# Patient Record
Sex: Male | Born: 1940 | Race: White | Hispanic: No | Marital: Married | Smoking: Never smoker
Health system: Southern US, Community
[De-identification: ages and names within clinical notes are randomized; demographics above are authoritative.]

## PROBLEM LIST (undated history)

## (undated) DIAGNOSIS — I509 Heart failure, unspecified: Secondary | ICD-10-CM

## (undated) DIAGNOSIS — N19 Unspecified kidney failure: Secondary | ICD-10-CM

## (undated) DIAGNOSIS — C4490 Unspecified malignant neoplasm of skin, unspecified: Secondary | ICD-10-CM

## (undated) DIAGNOSIS — E119 Type 2 diabetes mellitus without complications: Secondary | ICD-10-CM

## (undated) DIAGNOSIS — M5134 Other intervertebral disc degeneration, thoracic region: Secondary | ICD-10-CM

## (undated) DIAGNOSIS — J9690 Respiratory failure, unspecified, unspecified whether with hypoxia or hypercapnia: Secondary | ICD-10-CM

## (undated) DIAGNOSIS — N189 Chronic kidney disease, unspecified: Secondary | ICD-10-CM

## (undated) DIAGNOSIS — K862 Cyst of pancreas: Secondary | ICD-10-CM

## (undated) HISTORY — PX: CHOLECYSTECTOMY: SHX55

## (undated) HISTORY — PX: KYPHOPLASTY: SHX5884

## (undated) HISTORY — PX: AORTOILIAC BYPASS: SHX6417

---

## 2008-10-14 ENCOUNTER — Ambulatory Visit: Payer: Self-pay | Admitting: Vascular Surgery

## 2008-10-15 ENCOUNTER — Emergency Department: Payer: Self-pay | Admitting: Emergency Medicine

## 2010-05-04 ENCOUNTER — Ambulatory Visit: Payer: Self-pay | Admitting: Internal Medicine

## 2010-05-12 ENCOUNTER — Ambulatory Visit: Payer: Self-pay | Admitting: Unknown Physician Specialty

## 2010-05-15 ENCOUNTER — Ambulatory Visit: Payer: Self-pay | Admitting: Cardiovascular Disease

## 2010-05-18 ENCOUNTER — Ambulatory Visit: Payer: Self-pay | Admitting: Unknown Physician Specialty

## 2012-07-21 ENCOUNTER — Ambulatory Visit: Payer: Self-pay | Admitting: Family Medicine

## 2012-08-04 ENCOUNTER — Ambulatory Visit: Payer: Self-pay | Admitting: Gastroenterology

## 2014-03-15 ENCOUNTER — Ambulatory Visit: Payer: Self-pay | Admitting: Nephrology

## 2015-03-17 ENCOUNTER — Inpatient Hospital Stay
Admit: 2015-03-17 | Disposition: A | Discharge status: Home / Self Care | Payer: Self-pay | Attending: Internal Medicine | Admitting: Internal Medicine

## 2015-03-17 LAB — TROPONIN I
Troponin-I: <0.03 ng/mL
Troponin-I: <0.03 ng/mL

## 2015-03-17 LAB — CK TOTAL AND CKMB (NOT AT ARMC)
CK, TOTAL: 116 U/L
CK, Total: 115 U/L
CK-MB: 10.9 ng/mL — ABNORMAL HIGH
CK-MB: 11.5 ng/mL — AB

## 2015-03-18 LAB — CK TOTAL AND CKMB (NOT AT ARMC)
CK, TOTAL: 104 U/L
CK-MB: 10.5 ng/mL — ABNORMAL HIGH

## 2015-03-18 LAB — BASIC METABOLIC PANEL
Anion Gap: 8 (ref 7–16)
BUN: 65 mg/dL — ABNORMAL HIGH
CALCIUM: 8.9 mg/dL
CO2: 29 mmol/L
CREATININE: 1.87 mg/dL — AB
Chloride: 105 mmol/L
EGFR (African American): 41 — ABNORMAL LOW
EGFR (Non-African Amer.): 35 — ABNORMAL LOW
GLUCOSE: 182 mg/dL — AB
Potassium: 5.1 mmol/L
Sodium: 142 mmol/L

## 2015-03-18 LAB — TROPONIN I: Troponin-I: <0.03 ng/mL

## 2015-03-18 LAB — PLATELET COUNT: Platelet: 130 10*3/uL — ABNORMAL LOW (ref 150–440)

## 2015-03-19 LAB — BASIC METABOLIC PANEL
ANION GAP: 6 — AB (ref 7–16)
BUN: 65 mg/dL — AB
CO2: 30 mmol/L
Calcium, Total: 8.2 mg/dL — ABNORMAL LOW
Chloride: 103 mmol/L
Creatinine: 1.78 mg/dL — ABNORMAL HIGH
EGFR (African American): 43 — ABNORMAL LOW
EGFR (Non-African Amer.): 37 — ABNORMAL LOW
GLUCOSE: 87 mg/dL
POTASSIUM: 4.6 mmol/L
SODIUM: 139 mmol/L

## 2015-03-19 LAB — MAGNESIUM: Magnesium: 2.7 mg/dL — ABNORMAL HIGH

## 2015-03-20 LAB — BASIC METABOLIC PANEL
ANION GAP: 7 (ref 7–16)
BUN: 60 mg/dL — AB
CHLORIDE: 103 mmol/L
Calcium, Total: 8.7 mg/dL — ABNORMAL LOW
Co2: 32 mmol/L
Creatinine: 1.66 mg/dL — ABNORMAL HIGH
EGFR (African American): 47 — ABNORMAL LOW
GFR CALC NON AF AMER: 41 — AB
Glucose: 71 mg/dL
Potassium: 4.3 mmol/L
Sodium: 142 mmol/L

## 2015-03-21 LAB — CBC WITH DIFFERENTIAL/PLATELET
BASOS ABS: 0.1 10*3/uL (ref 0.0–0.1)
Basophil %: 1 %
EOS ABS: 0.2 10*3/uL (ref 0.0–0.7)
Eosinophil %: 3 %
HCT: 33.5 % — AB (ref 40.0–52.0)
HGB: 10.9 g/dL — ABNORMAL LOW (ref 13.0–18.0)
LYMPHS ABS: 1.4 10*3/uL (ref 1.0–3.6)
Lymphocyte %: 24.9 %
MCH: 30.7 pg (ref 26.0–34.0)
MCHC: 32.6 g/dL (ref 32.0–36.0)
MCV: 94 fL (ref 80–100)
Monocyte #: 0.6 x10 3/mm (ref 0.2–1.0)
Monocyte %: 11.1 %
Neutrophil #: 3.5 10*3/uL (ref 1.4–6.5)
Neutrophil %: 60 %
Platelet: 126 10*3/uL — ABNORMAL LOW (ref 150–440)
RBC: 3.56 10*6/uL — ABNORMAL LOW (ref 4.40–5.90)
RDW: 14 % (ref 11.5–14.5)
WBC: 5.8 10*3/uL (ref 3.8–10.6)

## 2015-03-21 LAB — APTT: Activated PTT: 35 secs (ref 23.6–35.9)

## 2015-03-21 LAB — BASIC METABOLIC PANEL
ANION GAP: 10 (ref 7–16)
BUN: 48 mg/dL — ABNORMAL HIGH
Calcium, Total: 8.9 mg/dL
Chloride: 94 mmol/L — ABNORMAL LOW
Co2: 37 mmol/L — ABNORMAL HIGH
Creatinine: 1.45 mg/dL — ABNORMAL HIGH
EGFR (African American): 55 — ABNORMAL LOW
EGFR (Non-African Amer.): 48 — ABNORMAL LOW
Glucose: 178 mg/dL — ABNORMAL HIGH
POTASSIUM: 3.9 mmol/L
Sodium: 141 mmol/L

## 2015-03-21 LAB — PROTIME-INR
INR: 1
PROTHROMBIN TIME: 13.6 s

## 2015-03-21 LAB — PROTEIN / CREATININE RATIO, URINE
Creatinine, Urine Random: 27 mg/dL (ref 30–125)
Protein, Urine: <6 mg/dL — ABNORMAL LOW (ref 0–9)

## 2015-03-21 LAB — HEMOGLOBIN A1C: HEMOGLOBIN A1C: 7.7 % — AB

## 2015-03-22 ENCOUNTER — Other Ambulatory Visit: Payer: Self-pay

## 2015-03-22 LAB — BODY FLUID CELL COUNT WITH DIFFERENTIAL
Basophil: 0 %
Eosinophil: 0 %
LYMPHS PCT: 85 %
NUCLEATED CELL COUNT: 1426 /mm3
Neutrophils: 4 %
OTHER CELLS BF: 0 %
OTHER MONONUCLEAR CELLS: 11 %

## 2015-03-22 LAB — BASIC METABOLIC PANEL
ANION GAP: 8 (ref 7–16)
BUN: 46 mg/dL — ABNORMAL HIGH
CALCIUM: 9 mg/dL
Chloride: 92 mmol/L — ABNORMAL LOW
Co2: 40 mmol/L
Creatinine: 1.44 mg/dL — ABNORMAL HIGH
EGFR (African American): 56 — ABNORMAL LOW
GFR CALC NON AF AMER: 48 — AB
Glucose: 213 mg/dL — ABNORMAL HIGH
Potassium: 4 mmol/L
Sodium: 141 mmol/L

## 2015-03-22 LAB — LACTATE DEHYDROGENASE, PLEURAL OR PERITONEAL FLUID: LDH, BODY FLUID: 102 U/L

## 2015-03-22 LAB — PROTEIN, BODY FLUID: Protein, Body Fluid: 4.7 g/dL

## 2015-03-22 LAB — GLUCOSE, SEROUS FLUID: Glucose, Body Fluid: 281 mg/dL

## 2015-03-22 LAB — ALBUMIN, FLUID (OTHER): Body Fluid Albumin: 2.9 g/dL

## 2015-03-26 LAB — BODY FLUID CULTURE

## 2015-03-27 NOTE — Consult Note (Signed)
   Present Illness Pt is a 74 yo male with history of cad s/p cabg in 1997 who has been doing fairly well from a cardiac standpoint until several weeks ago when he began noting increased weight and increased swelling in his legs. He presented to his pcp this morning and had a cxr which revealed bilateral pleural effusions and was sent to the er where he was admitted for probable chf. He has gained approximatly 15 pounds in the last several weeks. He has 3-4+ edema in his legs. He denies orthopnea or pnd. He is able to lay flat in bed. He denies chest pain. He was noted ot have worsening renal failure and was referred to nephrology. He has been on 10 mg of amlodipine for the past few weeks. He is currently hemodynamically stable. Serum tropoinin was normal. renal funciton is somehwat worsenend from previously.   Physical Exam:  GEN no acute distress   HEENT PERRL   NECK No masses   RESP clear BS   CARD Regular rate and rhythm  Murmur   Murmur Systolic   ABD denies tenderness  normal BS   LYMPH negative neck, negative axillae   EXTR negative cyanosis/clubbing   SKIN normal to palpation   NEURO cranial nerves intact, motor/sensory function intact   PSYCH alert, A+O to time, place, person, good insight   Review of Systems:  Subjective/Chief Complaint sob and peripheral edema   General: Fatigue   Skin: No Complaints   ENT: No Complaints   Eyes: No Complaints   Neck: No Complaints   Respiratory: Short of breath   Cardiovascular: No Complaints   Gastrointestinal: No Complaints   Genitourinary: No Complaints   Vascular: No Complaints   Musculoskeletal: No Complaints   Neurologic: No Complaints   Hematologic: No Complaints   Endocrine: No Complaints   Psychiatric: No Complaints   Review of Systems: All other systems were reviewed and found to be negative   Medications/Allergies Reviewed Medications/Allergies reviewed   Family & Social History:  Family and  Social History:  Family History Non-Contributory   Social History negative tobacco   Place of Living Home   EKG:  EKG NSR    Adhesive: Rash   Impression Pt with history of cad s/p cabg now admitted with worsening peripheral edema and mild pleural effusions on cxr. Has acute on chronic renal insuffiency. He has ruled out for an mi. Etiology of volume overload is unclear. Will need echo to evaluate for valvular or structural heart disese. Will also agree with nephrology evaluation. Will carefully diureses and follow. Will also reduce amlodipine to 5 mg dialy and see if this will help.   Plan 1. Careful diuresis following renal funciton 2. Review echo when available 3. Reduce amlodipine to 5 mg dialy and follow blood pressure 4. Agree wtih nephrology consult and input.   Electronic Signatures: Teodoro Spray (MD)  (Signed 21-Apr-16 20:14)  Authored: General Aspect/Present Illness, History and Physical Exam, Review of System, Family & Social History, EKG , Allergies, Impression/Plan   Last Updated: 21-Apr-16 20:14 by Teodoro Spray (MD)

## 2015-03-27 NOTE — H&P (Signed)
PATIENT NAME:  John Schultz, John Schultz MR#:  [id number] DATE OF BIRTH:  [birth year]  DATE OF ADMISSION:  03/17/2015  PRIMARY NEPHROLOGIST:  John Iba, MD   CHIEF COMPLAINT:  Increasing shortness breath and weight gain with leg edema, bilateral lower extremity edema for 2 weeks.   HISTORY OF PRESENT ILLNESS:  John Schultz is a very pleasant 74 year old Caucasian gentleman with past medical history of type 2 diabetes, insulin requiring, history of CKD stage II, followed by nephrology along with history of hypertension, coronary artery disease, status post CABG about 18 years ago, not followed by cardiology as outpatient and no prior history of congestive heart failure, is a direct admit from John Schultz office with increasing shortness of breath, weight gain, and leg edema.  Denies any chest pain or palpitation, not sleeping well due to shortness of breath.  No added salt or NSAIDs, no urinary changes, no dietary changes. He takes Lasix on a daily basis.  The patient denies any recent fever or any other illness.   PAST MEDICAL HISTORY:   1.  Type 2 diabetes on insulin. He follows up with Dr. Gabriel Schultz.    2.  Hypertension.  3.  Coronary artery disease status post CABG about 18 years ago.  4.  History of CKD stage II with a workup in the past for nephrotic syndrome by Dr. Candiss Schultz, creatinine baseline is 1.1.  5.  Chronic back pain with mild spinal stenosis.  6.  Hypercholesterolemia.  7.  Peripheral vascular disease.  8.  Diabetic retinopathy.   9.  History of osteoporosis.   10. Compression fracture.  11. Vitamin D deficiency.  12. History of skin cancer.   FAMILY HISTORY:  Positive for hypertension and diabetes.   SOCIAL HISTORY:  Married, lives at home.  Nonsmoker, nonalcoholic.   REVIEW OF SYSTEMS:   CONSTITUTIONAL:  No fever. Positive for fatigue and weakness.  EYES:  No blurred or double vision.  ENT:  No tinnitus, ear pain, or hearing loss.  RESPIRATORY:  No cough, wheeze, or  hemoptysis.  CARDIOVASCULAR:  No chest pain.  Positive for orthopnea and edema and paroxysmal nocturnal dyspnea.  GASTROINTESTINAL:  No nausea, vomiting, diarrhea or abdominal pain.  No GERD.  GENITOURINARY:  No dysuria, hematuria, or frequency.  ENDOCRINE:  No polyuria, nocturia or thyroid problems.  HEMATOLOGY:  No anemia or easy bruising.  SKIN:  No acne or rash.  MUSCULOSKELETAL:  Positive for arthritis.  NEUROLOGIC:  No CVA, TIA, dysarthria, or dementia.  PSYCHIATRIC:  No anxiety or depression.     All other systems reviewed and negative.   MEDICATIONS:  1.  Vitamin D 1 tablet p.o. daily.  2.  Tylenol 500 two tablets every 8 hourly.  3.  Os-Cal with calcium 1 tablet daily.  4.  NovoLog 5 units subcutaneously daily at lunch, 5 units once a day at dinner according to the sliding scale.  5.  Multivitamin p.o. daily.  6.  Metformin extended release 500 mg 3 tablets p.o. daily at dinnertime.   7.  Lovastatin 40 mg 2 tablets at bedtime, comes to 80 mg at bedtime.  8.  Lisinopril 20 mg b.i.d.  9.  Lantus 15 units subcutaneous daily at bedtime.  10. Gabapentin 300 mg 2 tablets p.o. daily at bedtime.  11. Lasix 20 mg p.o. daily as needed.  12. Chlorthalidone 25 mg once a day.  13. Atenolol 25 mg 1 tablet b.i.d.  14. Aspirin 81 mg daily.  15. Amlodipine 10 mg daily.  16. Alendronate 1 tablet weekly.   PHYSICAL EXAMINATION:  GENERAL:  The patient is awake, alert, oriented x 3, not in acute distress.   VITAL SIGNS:  Afebrile, pulse 53, respirations 18, blood pressure is 145/71, saturations are 89% room air, 95% on 1 liter.  HEENT:  Atraumatic, normocephalic.  PERRLA.  EOM intact.  Oral mucosa is moist.  NECK:  Supple.  No JVD.  No carotid bruit.  RESPIRATORY:  There are decreased breath sounds bilaterally in the bases.  Few crackles heard.  No respiratory distress or labored breathing.  CARDIOVASCULAR:  Both the heart sounds are normal.  Rate and rhythm is regular.  PMI not  lateralized.  Chest nontender.  EXTREMITIES:  The patient has pitting edema bilaterally up to the knee joint.  Feeble pedal pulses secondary to edema and good femoral pulses.  ABDOMEN:  Soft, benign, nontender.  No organomegaly.  Positive bowel sounds.  NEUROLOGIC:  Grossly intact cranial nerves II through XII.  No motor or sensory deficits.  PSYCHIATRIC:  The patient is awake, alert, oriented x 3.  SKIN:  Warm and dry.    LABORATORIES:  Outpatient labs that were done at [facility] today, sodium is 139, potassium is 5.4, chloride is 104, bicarbonate is 30.6, BUN is 64, creatinine is 2.1, baseline creatinine is 1.1, glucose is 370, AST is 23, ALT is 17, bilirubin total is 0.6, total protein is 7.5. Hemoglobin and hematocrit are 11.2 and 34.9, white count is 6.1, platelet count is 143,000. Chest x-ray according to the PA in John Schultz office showed pulmonary vascular congestion/pulmonary edema.   ASSESSMENT AND PLAN:  74 year old John Schultz with a history of coronary artery disease status post coronary artery bypass graft, hypertension, peripheral vascular disease, type 2 diabetes, comes in with:  1.  Acute congestive heart failure.  Ejection fraction unknown at this time.  We will admit the patient to telemetry floor.  Start IV Lasix 20 mg t.i.d., monitor his inputs and outputs and creatinine closely.  Cycle cardiac enzymes x 3.  Check lipid profile.  Will get echocardiogram of the heart.  Will continue the patient on atenolol and amlodipine for now.  The case was discussed with Dr. Ubaldo Schultz who will see the patient in consultation.  2.  Hyperlipidemia.  Continue lovastatin.  3.  Acute on chronic kidney disease stage III.  The patient's baseline creatinine is 1.1, today it is 2.13.  Dr. Candiss Schultz to see the patient.  There was workup for nephrotic syndrome done as outpatient per Dr. Candiss Schultz.  Avoid nephrotoxins.  4.  Type 2 diabetes. I will continue Lantus and sliding scale insulin.  I will hold off on  metformin given elevated creatinine.  .   5.  Peripheral neuropathy.  Continue gabapentin.  6.  For deep venous thrombosis prophylaxis, subcutaneous heparin t.i.d.    Further work-up according to the patient's clinical course.  Hospital admission plan was discussed with the patient and the patient's family members.   TIME SPENT:  50 minutes.   CODE STATUS: The patient is a full code.     ____________________________ Hart [place]. Posey Pronto, MD sap:NT D: 03/17/2015 16:22:22 ET T: 03/17/2015 16:51:03 ET JOB#: [id number]  cc: Lucretia Pendley A. Posey Pronto, MD, <Dictator> John Iba, MD Ilda Basset MD ELECTRONICALLY SIGNED 03/25/2015 15:21

## 2015-03-27 NOTE — Consult Note (Signed)
Brief Consult Note: Diagnosis: edema and wieght gain.   Patient was seen by consultant.   Recommend further assessment or treatment.   Orders entered.   Discussed with Attending MD.   Comments: 74 yo male s/p cabg in 1998 at [facility] admitted after presenting to his pcp with 2 weeks of weight gain and edema in lower extremeties. Started several months agoo with graducal worseing. Ruled out for mi thus far. Is on high dose amolodipine. Will reduce amlodipine to 5 mg. Has ckd. Agree with nephroloy evaluateion. WIll review echo when avaialbel and make further recs. Gentle diuresis.  Electronic Signatures: Teodoro Spray (MD)  (Signed 21-Apr-16 17:10)  Authored: Brief Consult Note   Last Updated: 21-Apr-16 17:10 by Teodoro Spray (MD)

## 2015-03-27 NOTE — Discharge Summary (Signed)
PATIENT NAME:  John Schultz, KEO MR#:  [id number] DATE OF BIRTH:  1941-10-03  DATE OF ADMISSION:  03/17/2015 DATE OF DISCHARGE:  03/22/2015  ADMITTING PHYSICIAN: Sona A. Posey Pronto, MD    DISCHARGING PHYSICIAN: Gladstone Lighter, MD   PRIMARY CARE PHYSICIAN: Dion Body, MD .   PRIMARY CARDIOLOGIST: [facility].   PRIMARY ENDOCRINOLOGIST: Sherlon Handing, MD    [place].  1. Cardiology consultation with Dr. Ubaldo Glassing.  2. Nephrology consultation with Dr. Anthonette Legato.   DISCHARGE DIAGNOSES:  1. Acute respiratory failure.  2. 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  3. Hypertension.  4. Chronic stage II, baseline creatinine around 1.2.  5. Acute renal failure in the hospital.  6. Anasarca.  7. Delirium in the hospital.  8. Diabetic neuropathy.  9. Diabetes mellitus.   DISCHARGE HOME MEDICATIONS:  1. Atenolol 25 mg p.o. b.i.d.  2. Norvasc 10 mg p.o. daily.  3. Lantus 15 units subcutaneous at bedtime.  4. NovoLog 5 units subcutaneously with dinner; and if greater than 200, 2 units for every 50 greater than 200.  5. Aspirin 81 mg p.o. daily.  6. Tylenol 1000 mg every 8 hours.  7. Os-Cal with calcium, vitamin D, 1 tablet p.o. daily.  8. Multivitamin 1 tablet p.o. daily.  9. Vitamin D 1 tablet p.o. daily.  10. Alendronate weekly on Sundays.  11. Lovastatin 40 mg 2 tablets at bedtime.  12. NovoLog 5 units subcutaneously once a day at lunch.  13. Gabapentin 300 mg p.o. at bedtime.  14. Lasix 40 mg p.o. daily.  15. Imdur 30 mg p.o. daily.  16. Klor-Con 10 mEq  p.o. daily while taking Lasix.   DISCHARGE HOME OXYGEN: 1 liter.   DISCHARGE DIET: Low-sodium, ADA, 1800-calorie diet.   DISCHARGE ACTIVITY: As tolerated.    FOLLOWUP INSTRUCTIONS: 1. PCP follow-up in 1-2 weeks.  2. Nephrology follow-up in 2 weeks.  3. Endocrinology follow-up in 2-3 weeks.  4. Home health.   LABORATORIES AND IMAGING STUDIES PRIOR TO  DISCHARGE: Sodium 141, potassium 4.0, chloride 92, bicarbonate 40, BUN 46, creatinine 1.4, glucose 213, calcium of 9.0.   WBC 5.8, hemoglobin 10.9, hematocrit 33.5, platelet count is 126,000. Chest x-ray on 03/20/2015 showing moderate to large right pleural effusion and stable pulmonary parenchymal consolidation. The patient did have thoracentesis on the right side done and about 800 mL of serous fluid was removed. Repeat chest x-ray prior to discharge showing low lung volumes, left basilar atelectasis, and probable small right pleural effusion noted, and pulmonary edema noted. Pleural fluid cytology is pending; however, Gram stain and cultures are negative, it seems to look like transudate. INR is 1.0. HbA1c is 7.7.   BRIEF HOSPITAL COURSE: Mr. John Schultz is a 74 year old male with past medical history significant for insulin-dependent diabetes mellitus, chronic kidney disease stage II, coronary artery disease status post bypass graft surgery, was sent in from primary care 27 office secondary to worsening weight gain and increasing shortness of breath.  1. Anasarca and acute hypoxic respiratory failure secondary to worsening right pleural effusion and also acute on chronic diastolic congestive heart failure exacerbation. He was placed on a Lasix drip and has achieved significant diuresis. He has put on about 17 pounds in 6 weeks. His baseline weight is around 189 pounds. He is back to his baseline weight after diuresis and is being discharged on oral Lasix. He was seen by cardiology and also by nephrology in the hospital. The patient also had right-sided thoracentesis  for his large right pleural effusion for therapeutic purposes with significant relief. He had about 800cc pleural fluid removed by it. He remained on 3 liters oxygen in the hospital; however, was able to be weaned to 1 liter. Hopefully, he will be able to wean off the oxygen as an outpatient.   2. Diabetic neuropathy on gabapentin, higher  dose that is being decreased at the time of discharge due to acute delirium at nighttime in the hospital:  3. Acute delirium in the hospital, only sundowning in the evening. By every morning, he used to be very clear and used to get upset because of his behavior the night before. Risperidone was ordered as needed. After decreasing gabapentin dose, his delirium has improved.  4. Insulin-dependent diabetes mellitus, following with Dr. Gabriel Carina as an outpatient. Sugars were in the 200 range mostly, A1c is 7.7. Metformin is being stopped at the time of discharge due to his acute on chronic kidney disease.  5. Acute renal failure on chronic kidney disease stage II. Baseline creatinine around 1.1 to 1.2, increased in the hospital, but is discharged in a stable condition of 1.4. Followed by nephrology in the hospital and outpatient follow-up recommended.   6. Hypertension. Medications were adjusted in the hospital, and the patient was advised to be on these changed medications at this time.  7. His course has been otherwise uneventful in the hospital.   DISCHARGE CONDITION: Stable.   DISCHARGE DISPOSITION: Home with home health.   TIME SPENT ON DISCHARGE: 40 minutes.     ____________________________ Gladstone Lighter, MD rk:mw D: 03/23/2015 10:48:40 ET T: 03/23/2015 14:08:05 ET JOB#: [id number]  cc: Gladstone Lighter, MD, <Dictator> A. Lavone Orn, MD Dion Body, MD Murlean Iba, MD Gladstone Lighter MD ELECTRONICALLY SIGNED 03/24/2015 18:17

## 2015-03-28 LAB — CYTOLOGY - NON PAP

## 2015-04-24 ENCOUNTER — Encounter: Payer: Self-pay | Admitting: Emergency Medicine

## 2015-04-24 ENCOUNTER — Other Ambulatory Visit: Payer: Self-pay

## 2015-04-24 ENCOUNTER — Emergency Department
Admission: EM | Admit: 2015-04-24 | Discharge: 2015-04-24 | Disposition: A | Discharge status: Home / Self Care | Payer: Commercial Managed Care - HMO | Attending: Emergency Medicine | Admitting: Emergency Medicine

## 2015-04-24 ENCOUNTER — Emergency Department: Payer: Commercial Managed Care - HMO

## 2015-04-24 DIAGNOSIS — R635 Abnormal weight gain: Secondary | ICD-10-CM | POA: Insufficient documentation

## 2015-04-24 DIAGNOSIS — R600 Localized edema: Secondary | ICD-10-CM | POA: Insufficient documentation

## 2015-04-24 DIAGNOSIS — Z951 Presence of aortocoronary bypass graft: Secondary | ICD-10-CM | POA: Insufficient documentation

## 2015-04-24 DIAGNOSIS — E119 Type 2 diabetes mellitus without complications: Secondary | ICD-10-CM | POA: Insufficient documentation

## 2015-04-24 DIAGNOSIS — I2510 Atherosclerotic heart disease of native coronary artery without angina pectoris: Secondary | ICD-10-CM | POA: Insufficient documentation

## 2015-04-24 DIAGNOSIS — I509 Heart failure, unspecified: Secondary | ICD-10-CM | POA: Diagnosis not present

## 2015-04-24 DIAGNOSIS — N182 Chronic kidney disease, stage 2 (mild): Secondary | ICD-10-CM | POA: Insufficient documentation

## 2015-04-24 DIAGNOSIS — M7989 Other specified soft tissue disorders: Secondary | ICD-10-CM

## 2015-04-24 HISTORY — DX: Unspecified kidney failure: N19

## 2015-04-24 HISTORY — DX: Heart failure, unspecified: I50.9

## 2015-04-24 HISTORY — DX: Respiratory failure, unspecified, unspecified whether with hypoxia or hypercapnia: J96.90

## 2015-04-24 HISTORY — DX: Type 2 diabetes mellitus without complications: E11.9

## 2015-04-24 HISTORY — DX: Cyst of pancreas: K86.2

## 2015-04-24 HISTORY — DX: Unspecified malignant neoplasm of skin, unspecified: C44.90

## 2015-04-24 HISTORY — DX: Other intervertebral disc degeneration, thoracic region: M51.34

## 2015-04-24 LAB — TROPONIN I: Troponin I: <0.03 ng/mL (ref <0.031)

## 2015-04-24 LAB — COMPREHENSIVE METABOLIC PANEL
ALBUMIN: 4 g/dL (ref 3.5–5.0)
ALT: 18 U/L (ref 17–63)
AST: 32 U/L (ref 15–41)
Alkaline Phosphatase: 60 U/L (ref 38–126)
Anion gap: 8 (ref 5–15)
BILIRUBIN TOTAL: 0.6 mg/dL (ref 0.3–1.2)
BUN: 45 mg/dL — AB (ref 6–20)
CO2: 31 mmol/L (ref 22–32)
Calcium: 9 mg/dL (ref 8.9–10.3)
Chloride: 99 mmol/L — ABNORMAL LOW (ref 101–111)
Creatinine, Ser: 1.49 mg/dL — ABNORMAL HIGH (ref 0.61–1.24)
GFR, EST AFRICAN AMERICAN: 52 mL/min — AB (ref >60)
GFR, EST NON AFRICAN AMERICAN: 45 mL/min — AB (ref >60)
GLUCOSE: 76 mg/dL (ref 65–99)
Potassium: 4.8 mmol/L (ref 3.5–5.1)
SODIUM: 138 mmol/L (ref 135–145)
Total Protein: 8 g/dL (ref 6.5–8.1)

## 2015-04-24 LAB — CBC WITH DIFFERENTIAL/PLATELET
Basophils Absolute: 0.1 10*3/uL (ref 0–0.1)
Basophils Relative: 1 %
Eosinophils Absolute: 0.3 10*3/uL (ref 0–0.7)
Eosinophils Relative: 5 %
HCT: 37.4 % — ABNORMAL LOW (ref 40.0–52.0)
Hemoglobin: 12.4 g/dL — ABNORMAL LOW (ref 13.0–18.0)
Lymphocytes Relative: 31 %
Lymphs Abs: 1.7 10*3/uL (ref 1.0–3.6)
MCH: 30.7 pg (ref 26.0–34.0)
MCHC: 33.1 g/dL (ref 32.0–36.0)
MCV: 92.9 fL (ref 80.0–100.0)
Monocytes Absolute: 0.5 10*3/uL (ref 0.2–1.0)
Monocytes Relative: 9 %
NEUTROS ABS: 3 10*3/uL (ref 1.4–6.5)
NEUTROS PCT: 54 %
Platelets: 148 10*3/uL — ABNORMAL LOW (ref 150–440)
RBC: 4.03 MIL/uL — ABNORMAL LOW (ref 4.40–5.90)
RDW: 13.5 % (ref 11.5–14.5)
WBC: 5.6 10*3/uL (ref 3.8–10.6)

## 2015-04-24 LAB — BRAIN NATRIURETIC PEPTIDE: B NATRIURETIC PEPTIDE 5: 703 pg/mL — AB (ref 0.0–100.0)

## 2015-04-24 NOTE — ED Notes (Signed)
NAD noted at time of D/C. Pt denies questions or concerns. Pt ambulatory to the lobby at this time.  

## 2015-04-24 NOTE — ED Notes (Signed)
Patient to ED with wife who reports patient was hospitalized a couple of months ago due to CHF, over last week has gained 6-7 pounds. Patient also reports some increase in weakness and leg swelling.

## 2015-04-24 NOTE — Discharge Instructions (Signed)
Edema Edema is an abnormal buildup of fluids. It is more common in your legs and thighs. Painless swelling of the feet and ankles is more likely as a person ages. It also is common in looser skin, like around your eyes. HOME CARE   Keep the affected body part above the level of the heart while lying down.  Do not sit still or stand for a long time.  Do not put anything right under your knees when you lie down.  Do not wear tight clothes on your upper legs.  Exercise your legs to help the puffiness (swelling) go down.  Wear elastic bandages or support stockings as told by your doctor.  A low-salt diet may help lessen the puffiness.  Only take medicine as told by your doctor. GET HELP IF:  Treatment is not working.  You have heart, liver, or kidney disease and notice that your skin looks puffy or shiny.  You have puffiness in your legs that does not get better when you raise your legs.  You have sudden weight gain for no reason. GET HELP RIGHT AWAY IF:   You have shortness of breath or chest pain.  You cannot breathe when you lie down.  You have pain, redness, or warmth in the areas that are puffy.  You have heart, liver, or kidney disease and get edema all of a sudden.  You have a fever and your symptoms get worse all of a sudden. MAKE SURE YOU:   Understand these instructions.  Will watch your condition.  Will get help right away if you are not doing well or get worse. Document Released: 04/30/2008 Document Revised: 11/17/2013 Document Reviewed: 09/04/2013 [facility] Patient Information 2015 [place], [place]. This information is not intended to replace advice given to you by your health care provider. Make sure you discuss any questions you have with your health care provider.  Heart Failure Heart failure means your heart has trouble pumping blood. This makes it hard for your body to work well. Heart failure is usually a long-term (chronic) condition. You must take good  care of yourself and follow your doctor's treatment plan. HOME CARE  Take your heart medicine as told by your doctor.  Do not stop taking medicine unless your doctor tells you to.  Do not skip any dose of medicine.  Refill your medicines before they run out.  Take other medicines only as told by your doctor or pharmacist.  Stay active if told by your doctor. The elderly and people with severe heart failure should talk with a doctor about physical activity.  Eat heart-healthy foods. Choose foods that are without trans fat and are low in saturated fat, cholesterol, and salt (sodium). This includes fresh or frozen fruits and vegetables, fish, lean meats, fat-free or low-fat dairy foods, whole grains, and high-fiber foods. Lentils and dried peas and beans (legumes) are also good choices.  Limit salt if told by your doctor.  Cook in a healthy way. Roast, grill, broil, bake, poach, steam, or stir-fry foods.  Limit fluids as told by your doctor.  Weigh yourself every morning. Do this after you pee (urinate) and before you eat breakfast. Write down your weight to give to your doctor.  Take your blood pressure and write it down if your doctor tells you to.  Ask your doctor how to check your pulse. Check your pulse as told.  Lose weight if told by your doctor.  Stop smoking or chewing tobacco. Do not use gum or  patches that help you quit without your doctor's approval.  Schedule and go to doctor visits as told.  Nonpregnant women should have no more than 1 drink a day. Men should have no more than 2 drinks a day. Talk to your doctor about drinking alcohol.  Stop illegal drug use.  Stay current with shots (immunizations).  Manage your health conditions as told by your doctor.  Learn to manage your stress.  Rest when you are tired.  If it is really hot outside:  Avoid intense activities.  Use air conditioning or fans, or get in a cooler place.  Avoid caffeine and  alcohol.  Wear loose-fitting, lightweight, and light-colored clothing.  If it is really cold outside:  Avoid intense activities.  Layer your clothing.  Wear mittens or gloves, a hat, and a scarf when going outside.  Avoid alcohol.  Learn about heart failure and get support as needed.  Get help to maintain or improve your quality of life and your ability to care for yourself as needed. GET HELP IF:   You gain 03 lb/1.4 kg or more in 1 day or 05 lb/2.3 kg in a week.  You are more short of breath than usual.  You cannot do your normal activities.  You tire easily.  You cough more than normal, especially with activity.  You have any or more puffiness (swelling) in areas such as your hands, feet, ankles, or belly (abdomen).  You cannot sleep because it is hard to breathe.  You feel like your heart is beating fast (palpitations).  You get dizzy or light-headed when you stand up. GET HELP RIGHT AWAY IF:   You have trouble breathing.  There is a change in mental status, such as becoming less alert or not being able to focus.  You have chest pain or discomfort.  You faint. MAKE SURE YOU:   Understand these instructions.  Will watch your condition.  Will get help right away if you are not doing well or get worse. Document Released: 08/21/2008 Document Revised: 03/29/2014 Document Reviewed: 12/29/2012 [facility] Patient Information 2015 [place], [place]. This information is not intended to replace advice given to you by your health care provider. Make sure you discuss any questions you have with your health care provider.

## 2015-04-24 NOTE — ED Provider Notes (Addendum)
IMPRESSION: No evidence of deep venous thrombosis. IMPRESSION: No acute cardiopulmonary disease. Low lung volumes with basilar Atelectasis.  Patient seen and checked out by Dr. Edd Fabian, patient presented for increased edema and weight gain. These appear to be more CHF related. He'll continue outpatient course with increased Lasix and return for worsening or worrisome symptoms.  Earleen Newport, MD 04/24/15 [id number]  Earleen [place], MD 04/24/15 [phone]

## 2015-04-24 NOTE — ED Provider Notes (Signed)
[facility] Emergency Department Provider Note  ____________________________________________  Time seen: Approximately 1:17 PM  I have reviewed the triage vital signs and the nursing notes.   HISTORY  Chief Complaint Leg Swelling and Weight Gain    HPI John Schultz is a 74 y.o. male with past medical history significant for diabetes, CKD stage II, coronary artery disease status post CABG, CHF who presents for evaluation of weight gain and bilateral lower extremity edema, right greater than left. Patient reports he has come been compliant with his Lasix however he was told by his doctor that if he gained more than 5 pounds in a week, he should present to the emergency department. He reports that this week he has gained 7 pounds. He has noted swelling in his legs. He denies any chest pain or difficulty breathing. No recent illness including no cough, sneezing, runny nose, congestion. This has been constant since onset earlier this week. Current severity is moderate. No modifying factors.   No past medical history on file.  There are no active problems to display for this patient.   No past surgical history on file.  No current outpatient prescriptions on file.  Allergies Review of patient's allergies indicates not on file.  No family history on file.  Social History History  Substance Use Topics  . Smoking status: Not on file  . Smokeless tobacco: Not on file  . Alcohol Use: Not on file    Review of Systems Constitutional: No fever/chills Eyes: No visual changes. ENT: No sore throat. Cardiovascular: Denies chest pain. Respiratory: Denies shortness of breath. Gastrointestinal: No abdominal pain.  No nausea, no vomiting.  No diarrhea.  No constipation. Genitourinary: Negative for dysuria. Musculoskeletal: Negative for back pain. Skin: Negative for rash. Neurological: Negative for headaches, focal weakness or numbness.  10-point ROS  otherwise negative.  ____________________________________________   PHYSICAL EXAM:  VITAL SIGNS: ED Triage Vitals  Enc Vitals Group     BP 04/24/15 1304 140/59 mmHg     Pulse Rate 04/24/15 1304 48     Resp 04/24/15 1304 16     Temp 04/24/15 1304 97.5 F (36.4 C)     Temp Source 04/24/15 1304 Oral     SpO2 04/24/15 1304 96 %     Weight 04/24/15 1304 188 lb (85.276 kg)     Height 04/24/15 1304 5\' 6"  (1.676 m)     Head Cir --      Peak Flow --      Pain Score --      Pain Loc --      Pain Edu? --      Excl. in [place]? --     Constitutional: Alert and oriented. Well appearing and in no acute distress. Eyes: Conjunctivae are normal. PERRL. EOMI. Head: Atraumatic. Nose: No congestion/rhinnorhea. Mouth/Throat: Mucous membranes are moist.  Oropharynx non-erythematous. Neck: No stridor.  Cardiovascular: Normal rate, regular rhythm. Grossly normal heart sounds.  Good peripheral circulation. Respiratory: Normal respiratory effort.  No retractions. Lungs CTAB. Gastrointestinal: Soft and nontender. No distention. No abdominal bruits. No CVA tenderness. Genitourinary: deferred Musculoskeletal: 2+ pitting edema of the right lower extremity, 1+ pitting edema of the left lower extremity Neurologic:  Normal speech and language. No gross focal neurologic deficits are appreciated. Speech is normal. No gait instability. Skin:  Skin is warm, dry and intact. No rash noted. Psychiatric: Mood and affect are normal. Speech and behavior are normal.  ____________________________________________   LABS (all labs ordered are listed,  but only abnormal results are displayed)  Labs Reviewed  CBC WITH DIFFERENTIAL/PLATELET  COMPREHENSIVE METABOLIC PANEL  TROPONIN I  BRAIN NATRIURETIC PEPTIDE   ____________________________________________  EKG  ED ECG REPORT I, Joanne Gavel, the attending physician, personally viewed and interpreted this ECG.   Date: 04/24/2015  EKG Time: 13:14  Rate: 51   Rhythm: sinus bradycardia  Axis: Normal  Intervals:first-degree A-V block   ST&T Change: No acute ST segment change  ____________________________________________  RADIOLOGY   CXR FINDINGS: Lung volumes are Schultz. Lung base opacity most consistent with atelectasis. No lung consolidation or edema. No pleural effusion or pneumothorax.  Changes from CABG surgery are stable. Cardiac silhouette is normal in size. No mediastinal or hilar masses or evidence of adenopathy.  Bony thorax is demineralized. There are 2 contiguous compression fractures at thoracolumbar junction, 1 treated with previous vertebroplasty, both stable.  IMPRESSION: No acute cardiopulmonary disease. Schultz lung volumes with basilar atelectasis.   ____________________________________________   PROCEDURES  Procedure(s) performed: None  Critical Care performed: No  ____________________________________________   INITIAL IMPRESSION / ASSESSMENT AND PLAN / ED COURSE  Pertinent labs & imaging results that were available during my care of the patient were reviewed by me and considered in my medical decision making (see chart for details).  John Schultz is a 74 y.o. male with past medical history significant for diabetes, CKD stage II, coronary artery disease status post CABG, CHF who presents for evaluation of 7 lb. weight gain and bilateral lower extremity edema, right greater than left despite compliance with Lasix. He denies any chest pain or difficulty breathing. Lungs are clear to auscultation bilaterally. Remaining vital signs stable, no increased work of breathing, no tachypnea or hypoxia and mild sinus bradycardia but he is maintaining adequate blood pressure. Suspect mild volume overload in the setting of CHF. Chest x-ray is negative for any evidence of pulmonary edema. We'll obtain Doppler ultrasound of the right lower extremity to rule out DVT as it appears more swollen than the left. Patient has doppler  DP pulses bilaterally. We'll give an added dose of IV Lasix if his creatinine permits and have him follow-up with his cardiologist in 2 days. D/W Dr. Saralyn Pilar, on call for Dr. Ubaldo Glassing, who agrees with the plan.  ----------------------------------------- 3:07 PM on 04/24/2015 -----------------------------------------  Labs and ultrasound pending. Care transferred to Dr. Jimmye Norman. ____________________________________________   FINAL CLINICAL IMPRESSION(S) / ED DIAGNOSES  Final diagnoses:  Leg swelling  Weight gain      Joanne Gavel, MD 04/24/15 1507

## 2015-05-02 NOTE — Patient Outreach (Signed)
[facility]) Care Management  05/02/2015  John Schultz Apr 03, 1941 [id number]   Referral received from [facility] and assigned to [name], RN for outreach.  [name]. [place], [place] Management [place] Assistant Phone: [phone] Fax: [phone]

## 2015-05-11 ENCOUNTER — Other Ambulatory Visit: Payer: Self-pay

## 2015-05-11 NOTE — Patient Outreach (Signed)
[place] [facility]) Care Management  05/11/2015  John Schultz 1941-06-09 [id number]   RN CM spoke with patient about the services of [facility].  Patient is agreeable to services of [facility].  Patient has had recent inpatient stay for congestive heart failure in April of this year.   Patient was recently seen in ED on 04/24/15 for leg swelling and weight gain.  Patient reports he had a weight gain of 7 pounds in one week and was short of breath.   Patient reports his weight this morning was 181.6.  Patient's has been told by his doctor his baseline weight should be 189 or less.  Patient reports trying to follow a low sodium diet and ADA 1800 cal diabetic diet.  Patient states he has an appointment with a nutritionist this week at [facility].   Patient reports his CBG today is 189.  Patient's A1c is 7.7 and target number is less than 7.  Presently patient is being followed at home by home health agency.  Patient does not know the name of the agency, however he states the nurse's name is Sharyn Lull.  He states her last visit today.  RN CM asked patient to give [facility] number to the home health nurse and ask the nurse to call with an update on his progress.  RN CM will inquire to the nurse what barriers still exist with this patient.  RN CM will wait to hear update from nurse to see if patient could benefit from involvement with our community nurse or our health coach. RN CM will check back with patient tomorrow morning for update.   [name], RN, [place]el Holter, [place] Telephonic Care Coordinator [phone]

## 2015-05-13 ENCOUNTER — Other Ambulatory Visit: Payer: Self-pay

## 2015-05-13 DIAGNOSIS — I5042 Chronic combined systolic (congestive) and diastolic (congestive) heart failure: Secondary | ICD-10-CM

## 2015-05-13 DIAGNOSIS — E1165 Type 2 diabetes mellitus with hyperglycemia: Secondary | ICD-10-CM

## 2015-05-13 DIAGNOSIS — I10 Essential (primary) hypertension: Secondary | ICD-10-CM

## 2015-05-13 NOTE — Patient Outreach (Signed)
[facility] [place] [facility]) Care Management  05/13/2015  John Schultz 07/18/1941 [id number]   RN CM attempted to reach [name], RN for [name] health agency to get an update on this patient.  Patient stated the nurse was making her last visit this week.  RN CM was trying to get an update on patient's progress.   Patient has agreed to the services of [facility] and agreed to schedule an appointment. RN CM will make a referral for [facility] community nurse for home assessment visit and management of patient' chronic disease.

## 2015-05-16 NOTE — Patient Outreach (Signed)
[facility]) Care Management  05/16/2015  John Schultz Mar 08, 1941 [id number]   Notification from [name], RN to assign Community RN, assigned [name] Minor, RN [name], RN as well).  [name]. [name], [name] Management [place] Assistant Phone: [phone] Fax: [phone]

## 2015-05-18 ENCOUNTER — Other Ambulatory Visit: Payer: Self-pay | Admitting: *Deleted

## 2015-05-18 NOTE — Patient Outreach (Signed)
Attempt made to f/u on referral for community case management (pt has hx  of HF, DM).  HIPPA compliant voice message left with contact number.   If no response, plan to try again.       Zara Chess.   [place] Care Management  [phone]

## 2015-05-19 ENCOUNTER — Other Ambulatory Visit: Payer: Self-pay | Admitting: *Deleted

## 2015-05-19 NOTE — Patient Outreach (Signed)
Second attempt made to contact pt, f/u on referral from [place]  for community case management.   HIPPA  compliant voice message left with contact number.  Plan to try  Again.      Zara Chess.   [place] Care Management  [phone]

## 2015-05-20 ENCOUNTER — Other Ambulatory Visit: Payer: Self-pay | Admitting: *Deleted

## 2015-05-20 NOTE — Patient Outreach (Signed)
Third attempt made to contact pt, f/u on referral for community nurse case management services.   HIPPA compliant voice message left with contact number.  With this being the third attempt, if no response, will send an unable to contact letter and if no response to letter within 10 days, will close case.     Zara Chess.   [place] Care Management  [phone]

## 2015-05-23 ENCOUNTER — Encounter: Payer: Self-pay | Admitting: *Deleted

## 2015-10-21 ENCOUNTER — Emergency Department
Admission: EM | Admit: 2015-10-21 | Discharge: 2015-10-21 | Disposition: A | Discharge status: Home / Self Care | Payer: Commercial Managed Care - HMO | Attending: Emergency Medicine | Admitting: Emergency Medicine

## 2015-10-21 ENCOUNTER — Emergency Department: Payer: Commercial Managed Care - HMO

## 2015-10-21 ENCOUNTER — Encounter: Payer: Self-pay | Admitting: Emergency Medicine

## 2015-10-21 DIAGNOSIS — R55 Syncope and collapse: Secondary | ICD-10-CM | POA: Diagnosis present

## 2015-10-21 DIAGNOSIS — S0001XA Abrasion of scalp, initial encounter: Secondary | ICD-10-CM | POA: Diagnosis not present

## 2015-10-21 DIAGNOSIS — Y998 Other external cause status: Secondary | ICD-10-CM | POA: Insufficient documentation

## 2015-10-21 DIAGNOSIS — S0003XA Contusion of scalp, initial encounter: Secondary | ICD-10-CM | POA: Insufficient documentation

## 2015-10-21 DIAGNOSIS — Y9259 Other trade areas as the place of occurrence of the external cause: Secondary | ICD-10-CM | POA: Diagnosis not present

## 2015-10-21 DIAGNOSIS — W1839XA Other fall on same level, initial encounter: Secondary | ICD-10-CM | POA: Diagnosis not present

## 2015-10-21 DIAGNOSIS — S0081XA Abrasion of other part of head, initial encounter: Secondary | ICD-10-CM | POA: Insufficient documentation

## 2015-10-21 DIAGNOSIS — S80211A Abrasion, right knee, initial encounter: Secondary | ICD-10-CM | POA: Insufficient documentation

## 2015-10-21 DIAGNOSIS — Y9389 Activity, other specified: Secondary | ICD-10-CM | POA: Diagnosis not present

## 2015-10-21 DIAGNOSIS — S0031XA Abrasion of nose, initial encounter: Secondary | ICD-10-CM | POA: Diagnosis not present

## 2015-10-21 DIAGNOSIS — S60511A Abrasion of right hand, initial encounter: Secondary | ICD-10-CM | POA: Diagnosis not present

## 2015-10-21 DIAGNOSIS — R001 Bradycardia, unspecified: Secondary | ICD-10-CM | POA: Diagnosis not present

## 2015-10-21 DIAGNOSIS — E119 Type 2 diabetes mellitus without complications: Secondary | ICD-10-CM | POA: Diagnosis not present

## 2015-10-21 LAB — BASIC METABOLIC PANEL
ANION GAP: 9 (ref 5–15)
BUN: 49 mg/dL — AB (ref 6–20)
CALCIUM: 10 mg/dL (ref 8.9–10.3)
CO2: 36 mmol/L — ABNORMAL HIGH (ref 22–32)
Chloride: 95 mmol/L — ABNORMAL LOW (ref 101–111)
Creatinine, Ser: 1.79 mg/dL — ABNORMAL HIGH (ref 0.61–1.24)
GFR calc Af Amer: 42 mL/min — ABNORMAL LOW (ref >60)
GFR calc non Af Amer: 36 mL/min — ABNORMAL LOW (ref >60)
GLUCOSE: 112 mg/dL — AB (ref 65–99)
POTASSIUM: 4.2 mmol/L (ref 3.5–5.1)
Sodium: 140 mmol/L (ref 135–145)

## 2015-10-21 LAB — CBC
HCT: 36.3 % — ABNORMAL LOW (ref 40.0–52.0)
HEMOGLOBIN: 12.2 g/dL — AB (ref 13.0–18.0)
MCH: 31.2 pg (ref 26.0–34.0)
MCHC: 33.7 g/dL (ref 32.0–36.0)
MCV: 92.6 fL (ref 80.0–100.0)
Platelets: 169 10*3/uL (ref 150–440)
RBC: 3.92 MIL/uL — ABNORMAL LOW (ref 4.40–5.90)
RDW: 13 % (ref 11.5–14.5)
WBC: 7.7 10*3/uL (ref 3.8–10.6)

## 2015-10-21 LAB — GLUCOSE, CAPILLARY: Glucose-Capillary: 78 mg/dL (ref 65–99)

## 2015-10-21 MED ORDER — BACITRACIN ZINC 500 UNIT/GM EX OINT
TOPICAL_OINTMENT | CUTANEOUS | Status: AC
  Administered 2015-10-21: 4
  Filled 2015-10-21: qty 3.6

## 2015-10-21 NOTE — Discharge Instructions (Signed)
No serious traumatic injury was found. I suspect your fall was due to low blood pressure upon standing. Make sure you sit down if you're feeling lightheaded or weak. Return to the emergency room for any worsening condition including any confusion or altered mental status, new weakness or numbness, or any sign of infection from abrasions. Use over-the-counter antibiotic ointment over the abrasions until healed.  Return to the emergency department for any chest pain, palpitations, nausea, sweating, dizziness, or additional passing out.  Contusion A contusion is a deep bruise. Contusions happen when an injury causes bleeding under the skin. Symptoms of bruising include pain, swelling, and discolored skin. The skin may turn blue, purple, or yellow. HOME CARE   Rest the injured area.  If told, put ice on the injured area.  Put ice in a plastic bag.  Place a towel between your skin and the bag.  Leave the ice on for 20 minutes, 2-3 times per day.  If told, put light pressure (compression) on the injured area using an elastic bandage. Make sure the bandage is not too tight. Remove it and put it back on as told by your doctor.  If possible, raise (elevate) the injured area above the level of your heart while you are sitting or lying down.  Take over-the-counter and prescription medicines only as told by your doctor. GET HELP IF:  Your symptoms do not get better after several days of treatment.  Your symptoms get worse.  You have trouble moving the injured area. GET HELP RIGHT AWAY IF:   You have very bad pain.  You have a loss of feeling (numbness) in a hand or foot.  Your hand or foot turns pale or cold.   This information is not intended to replace advice given to you by your health care provider. Make sure you discuss any questions you have with your health care provider.   Document Released: 04/30/2008 Document Revised: 08/03/2015 Document Reviewed: 03/30/2015 Elsevier  Interactive Patient Education 2016 [employer].  Abrasion An abrasion is a cut or scrape on the surface of your skin. An abrasion does not go through all of the layers of your skin. It is important to take good care of your abrasion to prevent infection. HOME CARE Medicines  Take or apply medicines only as told by your doctor.  If you were prescribed an antibiotic ointment, finish all of it even if you start to feel better. Wound Care  Clean the wound with mild soap and water 2-3 times per day or as told by your doctor. Pat your wound dry with a clean towel. Do not rub it.  There are many ways to close and cover a wound. Follow instructions from your doctor about:  How to take care of your wound.  When and how you should change your bandage (dressing).  When and how you should take off your dressing.  Check your wound every day for signs of infection. Watch for:  Redness, swelling, or pain.  Fluid, blood, or pus. General Instructions  Keep the dressing dry as told by your doctor. Do not take baths, swim, use a hot tub, or do anything that would put your wound underwater until your doctor says it is okay.  If there is swelling, raise (elevate) the injured area above the level of your heart while you are sitting or lying down.  Keep all follow-up visits as told by your doctor. This is important. GET HELP IF:  You were given a tetanus  shot and you have any of these where the needle went in:  Swelling.  Very bad pain.  Redness.  Bleeding.  Medicine does not help your pain.  You have any of these at the site of the wound:  More redness.  More swelling.  More pain. GET HELP RIGHT AWAY IF:  You have a red streak going away from your wound.  You have a fever.  You have fluid, blood, or pus coming from your wound.  There is a bad smell coming from your wound.   This information is not intended to replace advice given to you by your health care provider. Make  sure you discuss any questions you have with your health care provider.   Document Released: 04/30/2008 Document Revised: 03/29/2015 Document Reviewed: 11/10/2014 Elsevier Interactive Patient Education 2016 [employer].  Syncope Syncope is a medical term for fainting or passing out. This means you lose consciousness and drop to the ground. People are generally unconscious for less than 5 minutes. You may have some muscle twitches for up to 15 seconds before waking up and returning to normal. Syncope occurs more often in older adults, but it can happen to anyone. While most causes of syncope are not dangerous, syncope can be a sign of a serious medical problem. It is important to seek medical care.  CAUSES  Syncope is caused by a sudden drop in blood flow to the brain. The specific cause is often not determined. Factors that can bring on syncope include:  Taking medicines that lower blood pressure.  Sudden changes in posture, such as standing up quickly.  Taking more medicine than prescribed.  Standing in one place for too long.  Seizure disorders.  Dehydration and excessive exposure to heat.  Low blood sugar (hypoglycemia).  Straining to have a bowel movement.  Heart disease, irregular heartbeat, or other circulatory problems.  Fear, emotional distress, seeing blood, or severe pain. SYMPTOMS  Right before fainting, you may:  Feel dizzy or light-headed.  Feel nauseous.  See all white or all black in your field of vision.  Have cold, clammy skin. DIAGNOSIS  Your health care provider will ask about your symptoms, perform a physical exam, and perform an electrocardiogram (ECG) to record the electrical activity of your heart. Your health care provider may also perform other heart or blood tests to determine the cause of your syncope which may include:  Transthoracic echocardiogram (TTE). During echocardiography, sound waves are used to evaluate how blood flows through your  heart.  Transesophageal echocardiogram (TEE).  Cardiac monitoring. This allows your health care provider to monitor your heart rate and rhythm in real time.  Holter monitor. This is a portable device that records your heartbeat and can help diagnose heart arrhythmias. It allows your health care provider to track your heart activity for several days, if needed.  Stress tests by exercise or by giving medicine that makes the heart beat faster. TREATMENT  In most cases, no treatment is needed. Depending on the cause of your syncope, your health care provider may recommend changing or stopping some of your medicines. HOME CARE INSTRUCTIONS  Have someone stay with you until you feel stable.  Do not drive, use machinery, or play sports until your health care provider says it is okay.  Keep all follow-up appointments as directed by your health care provider.  Lie down right away if you start feeling like you might faint. Breathe deeply and steadily. Wait until all the symptoms have passed.  Drink enough fluids to keep your urine clear or pale yellow.  If you are taking blood pressure or heart medicine, get up slowly and take several minutes to sit and then stand. This can reduce dizziness. SEEK IMMEDIATE MEDICAL CARE IF:   You have a severe headache.  You have unusual pain in the chest, abdomen, or back.  You are bleeding from your mouth or rectum, or you have black or tarry stool.  You have an irregular or very fast heartbeat.  You have pain with breathing.  You have repeated fainting or seizure-like jerking during an episode.  You faint when sitting or lying down.  You have confusion.  You have trouble walking.  You have severe weakness.  You have vision problems. If you fainted, call your local emergency services (911 in [place] Do not drive yourself to the hospital.    This information is not intended to replace advice given to you by your health care provider. Make sure  you discuss any questions you have with your health care provider.   Document Released: 11/12/2005 Document Revised: 03/29/2015 Document Reviewed: 01/11/2012 Elsevier Interactive Patient Education [employer].

## 2015-10-21 NOTE — ED Notes (Signed)
Ambulated to commode independently.  Gait steady.  Denies c/o dizziness.  Moving all extremities equally and strong.  Gait steady.  Posture upright.

## 2015-10-21 NOTE — ED Notes (Signed)
Ems pt, was shopping , felt weakness in the legs , fell to the ground, abrasion to forehead, and nose , hematoma to posterior scalp. Pt does not recall if he passed out, but admits to having frequent fall here lately. Pt arrives alert and oriented , uses a cane.

## 2015-10-21 NOTE — ED Provider Notes (Signed)
[facility] Emergency Department Provider Note   ____________________________________________  Time seen:  I have reviewed the triage vital signs and the triage nursing note.  HISTORY  Chief Complaint Near Syncope   Historian Patient  HPI John Schultz is a 74 y.o. male who is here for evaluation after fall/syncope while out during [facility] Friday shopping. Patient states he had been standing up for a while and felt lightheaded and dizzy and then passed out. He did strike his head and his right knee where he has abrasions. No new chest pain or trouble breathing or abdominal pain or lower extremity pain. He does have right middle finger DIP joint erythema and mild tenderness which she states has been there for a couple weeks since a recent all/crush injury. He is being followed by a primary care physician for the inflammation of that joint. No other recent illnesses. Patient has had multiple falls over the past several months.    Past Medical History  Diagnosis Date  . CHF (congestive heart failure) ([place])   . DM (diabetes mellitus) ([place])   . Pancreatic cyst   . Skin cancer   . DDD (degenerative disc disease), thoracic   . Renal failure   . Respiratory failure ([place])     There are no active problems to display for this patient.   Past Surgical History  Procedure Laterality Date  . Aortoiliac bypass    . Cholecystectomy    . Kyphoplasty      No current outpatient prescriptions on file.  Allergies Review of patient's allergies indicates no known allergies.  No family history on file.  Social History Social History  Substance Use Topics  . Smoking status: Never Smoker   . Smokeless tobacco: Never Used  . Alcohol Use: No    Review of Systems  Constitutional: Negative for fever. Eyes: Negative for visual changes. ENT: Negative for sore throat. Cardiovascular: Negative for chest pain. Respiratory: Negative for shortness of  breath. Gastrointestinal: Negative for abdominal pain, vomiting and diarrhea. Genitourinary: Negative for dysuria. Musculoskeletal: Negative for back pain. Skin: Negative for rash. Neurological: Negative for headache. 10 point Review of Systems otherwise negative ____________________________________________   PHYSICAL EXAM:  VITAL SIGNS: ED Triage Vitals  Enc Vitals Group     BP 10/21/15 1058 130/68 mmHg     Pulse Rate 10/21/15 1058 54     Resp 10/21/15 1058 18     Temp 10/21/15 1058 98 F (36.7 C)     Temp Source 10/21/15 1058 Oral     SpO2 10/21/15 1058 96 %     Weight 10/21/15 1058 180 lb (81.647 kg)     Height 10/21/15 1058 5\' 6"  (1.676 m)     Head Cir --      Peak Flow --      Pain Score 10/21/15 1058 1     Pain Loc --      Pain Edu? --      Excl. in [place]? --      Constitutional: Alert and oriented. Well appearing and in no distress. Eyes: Conjunctivae are normal. PERRL. Normal extraocular movements. ENT   Head: Left posterior scalp hematoma with abrasion. Forehead abrasion. Nasal bridge abrasion. No nasal septal hematoma.   Nose: No congestion/rhinnorhea.   Mouth/Throat: Mucous membranes are moist.   Neck: No stridor. Cardiovascular/Chest: Bradycardic, regular..  No murmurs, rubs, or gallops. Respiratory: Normal respiratory effort without tachypnea nor retractions. Breath sounds are clear and equal bilaterally. No wheezes/rales/rhonchi. Gastrointestinal: Soft. No  distention, no guarding, no rebound. Nontender   Genitourinary/rectal:Deferred Musculoskeletal: Stable nontender pelvis. Abrasion over the right knee with no bony point tenderness or pain with range of motion. Abrasion over the right dorsal hand. Erythema and mild tenderness at the right tip of the third finger at the DIP joint. Neurologic:  Normal speech and language. No gross or focal neurologic deficits are appreciated. Skin:  Skin is warm, dry and intact. No rash noted. Psychiatric: Mood and  affect are normal. Speech and behavior are normal. Patient exhibits appropriate insight and judgment.  ____________________________________________   EKG I, Lisa Roca, MD, the attending physician have personally viewed and interpreted all ECGs.  54 bpm. Sinus bradycardia. Narrow QRS. Normal axis. First-degree AV block. Nonspecific ST and T-wave. ____________________________________________  LABS (pertinent positives/negatives)  Basic metabolic panel significant for BUN 49 and creatinine 1.79, chloride 95, CO2 36, glucose 112 White blood cell count 7.7, hemoglobin 12.2 and platelet count 169  ____________________________________________  RADIOLOGY All Xrays were viewed by me. Imaging interpreted by Radiologist.  Right hand:  IMPRESSION: No acute fracture or dislocation.  Moderate degenerative changes most prominent over the DIP joints with possible mild erosive change as findings could be seen in erosive osteoarthritis or inflammatory arthropathy such as psoriatic Arthritis.  CT head noncontrast:  IMPRESSION: No acute intracranial abnormality. Diffuse slight atrophy. Scalp contusion. __________________________________________  PROCEDURES  Procedure(s) performed: None  Critical Care performed: None  ____________________________________________   ED COURSE / ASSESSMENT AND PLAN  CONSULTATIONS: None  Pertinent labs & imaging results that were available during my care of the patient were reviewed by me and considered in my medical decision making (see chart for details).   Patient's family states he's had several falls over the past several months due to feeling of lightheadedness followed by gauze weakness that causes him to fall. Sounds like a similar episode to previous episodes. Today it sounds somewhat orthostatic, as he felt bad standing for a significant period of time and then became weak and fell to the ground. He probably didn't lose consciousness for a  very short period of time. He had no chest pain palpitations or trouble breathing or headache or one-sided weakness or numbness. I am not suspicious of an acute cardiac event.  He has intact neurologic exam now and I'm not suspicious for a CVA.  Head CT was obtained in no traumatic injury was found. He had no cervical spine tenderness on palpation and C-spine was cleared clinically by me.  No recent medical illnesses, and is laboratory evaluation are reassuring today.  Orthostatics stable this point time. IV fluids were not given due to a history of CHF.  I don't think the patient needs to be in the hospital. I did ask him to pay attention when he gets symptoms of lightheadedness or weakness to go ahead and sit down before he has a fall or passes out. Symptoms always per my care physician. I will ask him to follow-up with a neurologist if he has not seen one previously.  Patient / Family / Caregiver informed of clinical course, medical decision-making process, and agree with plan.   I discussed return precautions, follow-up instructions, and discharged instructions with patient and/or family.  ___________________________________________   FINAL CLINICAL IMPRESSION(S) / ED DIAGNOSES   Final diagnoses:  Scalp contusion, initial encounter  Facial abrasion, initial encounter  Knee abrasion, right, initial encounter  Syncope, unspecified syncope type       Lisa Roca, MD 10/21/15 1314

## 2015-10-21 NOTE — ED Notes (Signed)
AAOx3.  Skin warm and dry.  NAD 

## 2016-02-26 ENCOUNTER — Encounter: Payer: Self-pay | Admitting: Emergency Medicine

## 2016-02-26 ENCOUNTER — Inpatient Hospital Stay
Admission: EM | Admit: 2016-02-26 | Discharge: 2016-03-26 | DRG: 004 | Disposition: E | Payer: Commercial Managed Care - HMO | Attending: Internal Medicine | Admitting: Internal Medicine

## 2016-02-26 ENCOUNTER — Emergency Department: Payer: Commercial Managed Care - HMO

## 2016-02-26 DIAGNOSIS — Z515 Encounter for palliative care: Secondary | ICD-10-CM | POA: Diagnosis not present

## 2016-02-26 DIAGNOSIS — E1142 Type 2 diabetes mellitus with diabetic polyneuropathy: Secondary | ICD-10-CM | POA: Diagnosis present

## 2016-02-26 DIAGNOSIS — I2510 Atherosclerotic heart disease of native coronary artery without angina pectoris: Secondary | ICD-10-CM | POA: Diagnosis present

## 2016-02-26 DIAGNOSIS — E871 Hypo-osmolality and hyponatremia: Secondary | ICD-10-CM | POA: Diagnosis present

## 2016-02-26 DIAGNOSIS — R197 Diarrhea, unspecified: Secondary | ICD-10-CM

## 2016-02-26 DIAGNOSIS — I5032 Chronic diastolic (congestive) heart failure: Secondary | ICD-10-CM | POA: Diagnosis present

## 2016-02-26 DIAGNOSIS — R401 Stupor: Secondary | ICD-10-CM | POA: Insufficient documentation

## 2016-02-26 DIAGNOSIS — E1122 Type 2 diabetes mellitus with diabetic chronic kidney disease: Secondary | ICD-10-CM | POA: Diagnosis present

## 2016-02-26 DIAGNOSIS — L8990 Pressure ulcer of unspecified site, unspecified stage: Secondary | ICD-10-CM | POA: Diagnosis present

## 2016-02-26 DIAGNOSIS — R4182 Altered mental status, unspecified: Secondary | ICD-10-CM

## 2016-02-26 DIAGNOSIS — Z66 Do not resuscitate: Secondary | ICD-10-CM | POA: Diagnosis not present

## 2016-02-26 DIAGNOSIS — IMO0002 Reserved for concepts with insufficient information to code with codable children: Secondary | ICD-10-CM

## 2016-02-26 DIAGNOSIS — E11319 Type 2 diabetes mellitus with unspecified diabetic retinopathy without macular edema: Secondary | ICD-10-CM | POA: Diagnosis present

## 2016-02-26 DIAGNOSIS — M5134 Other intervertebral disc degeneration, thoracic region: Secondary | ICD-10-CM | POA: Diagnosis present

## 2016-02-26 DIAGNOSIS — J209 Acute bronchitis, unspecified: Secondary | ICD-10-CM | POA: Diagnosis not present

## 2016-02-26 DIAGNOSIS — G9341 Metabolic encephalopathy: Secondary | ICD-10-CM | POA: Diagnosis not present

## 2016-02-26 DIAGNOSIS — A400 Sepsis due to streptococcus, group A: Secondary | ICD-10-CM | POA: Diagnosis present

## 2016-02-26 DIAGNOSIS — R34 Anuria and oliguria: Secondary | ICD-10-CM | POA: Diagnosis not present

## 2016-02-26 DIAGNOSIS — A419 Sepsis, unspecified organism: Secondary | ICD-10-CM | POA: Diagnosis not present

## 2016-02-26 DIAGNOSIS — N170 Acute kidney failure with tubular necrosis: Secondary | ICD-10-CM | POA: Diagnosis present

## 2016-02-26 DIAGNOSIS — F05 Delirium due to known physiological condition: Secondary | ICD-10-CM | POA: Diagnosis not present

## 2016-02-26 DIAGNOSIS — R0600 Dyspnea, unspecified: Secondary | ICD-10-CM

## 2016-02-26 DIAGNOSIS — I2109 ST elevation (STEMI) myocardial infarction involving other coronary artery of anterior wall: Secondary | ICD-10-CM | POA: Diagnosis not present

## 2016-02-26 DIAGNOSIS — E1121 Type 2 diabetes mellitus with diabetic nephropathy: Secondary | ICD-10-CM | POA: Diagnosis present

## 2016-02-26 DIAGNOSIS — D649 Anemia, unspecified: Secondary | ICD-10-CM | POA: Diagnosis present

## 2016-02-26 DIAGNOSIS — R410 Disorientation, unspecified: Secondary | ICD-10-CM | POA: Diagnosis not present

## 2016-02-26 DIAGNOSIS — I130 Hypertensive heart and chronic kidney disease with heart failure and stage 1 through stage 4 chronic kidney disease, or unspecified chronic kidney disease: Secondary | ICD-10-CM | POA: Diagnosis present

## 2016-02-26 DIAGNOSIS — Z800 Family history of malignant neoplasm of digestive organs: Secondary | ICD-10-CM

## 2016-02-26 DIAGNOSIS — N179 Acute kidney failure, unspecified: Secondary | ICD-10-CM | POA: Diagnosis present

## 2016-02-26 DIAGNOSIS — R6521 Severe sepsis with septic shock: Secondary | ICD-10-CM | POA: Diagnosis not present

## 2016-02-26 DIAGNOSIS — M00261 Other streptococcal arthritis, right knee: Secondary | ICD-10-CM | POA: Diagnosis present

## 2016-02-26 DIAGNOSIS — I472 Ventricular tachycardia: Secondary | ICD-10-CM | POA: Diagnosis not present

## 2016-02-26 DIAGNOSIS — R195 Other fecal abnormalities: Secondary | ICD-10-CM | POA: Diagnosis not present

## 2016-02-26 DIAGNOSIS — B954 Other streptococcus as the cause of diseases classified elsewhere: Secondary | ICD-10-CM | POA: Diagnosis present

## 2016-02-26 DIAGNOSIS — D696 Thrombocytopenia, unspecified: Secondary | ICD-10-CM | POA: Diagnosis present

## 2016-02-26 DIAGNOSIS — Z978 Presence of other specified devices: Secondary | ICD-10-CM

## 2016-02-26 DIAGNOSIS — N189 Chronic kidney disease, unspecified: Secondary | ICD-10-CM

## 2016-02-26 DIAGNOSIS — E1165 Type 2 diabetes mellitus with hyperglycemia: Secondary | ICD-10-CM | POA: Diagnosis present

## 2016-02-26 DIAGNOSIS — E876 Hypokalemia: Secondary | ICD-10-CM | POA: Diagnosis not present

## 2016-02-26 DIAGNOSIS — E1151 Type 2 diabetes mellitus with diabetic peripheral angiopathy without gangrene: Secondary | ICD-10-CM | POA: Diagnosis present

## 2016-02-26 DIAGNOSIS — R404 Transient alteration of awareness: Secondary | ICD-10-CM

## 2016-02-26 DIAGNOSIS — J9601 Acute respiratory failure with hypoxia: Secondary | ICD-10-CM | POA: Diagnosis not present

## 2016-02-26 DIAGNOSIS — Z8249 Family history of ischemic heart disease and other diseases of the circulatory system: Secondary | ICD-10-CM

## 2016-02-26 DIAGNOSIS — E874 Mixed disorder of acid-base balance: Secondary | ICD-10-CM | POA: Diagnosis present

## 2016-02-26 DIAGNOSIS — Z79899 Other long term (current) drug therapy: Secondary | ICD-10-CM

## 2016-02-26 DIAGNOSIS — M00861 Arthritis due to other bacteria, right knee: Secondary | ICD-10-CM

## 2016-02-26 DIAGNOSIS — I480 Paroxysmal atrial fibrillation: Secondary | ICD-10-CM | POA: Diagnosis not present

## 2016-02-26 DIAGNOSIS — M009 Pyogenic arthritis, unspecified: Secondary | ICD-10-CM | POA: Diagnosis present

## 2016-02-26 DIAGNOSIS — Z85828 Personal history of other malignant neoplasm of skin: Secondary | ICD-10-CM | POA: Diagnosis not present

## 2016-02-26 DIAGNOSIS — E875 Hyperkalemia: Secondary | ICD-10-CM | POA: Diagnosis not present

## 2016-02-26 DIAGNOSIS — Z452 Encounter for adjustment and management of vascular access device: Secondary | ICD-10-CM

## 2016-02-26 DIAGNOSIS — R061 Stridor: Secondary | ICD-10-CM | POA: Diagnosis present

## 2016-02-26 DIAGNOSIS — I214 Non-ST elevation (NSTEMI) myocardial infarction: Secondary | ICD-10-CM | POA: Diagnosis present

## 2016-02-26 DIAGNOSIS — R1310 Dysphagia, unspecified: Secondary | ICD-10-CM | POA: Diagnosis not present

## 2016-02-26 DIAGNOSIS — N183 Chronic kidney disease, stage 3 (moderate): Secondary | ICD-10-CM | POA: Diagnosis present

## 2016-02-26 DIAGNOSIS — R600 Localized edema: Secondary | ICD-10-CM

## 2016-02-26 DIAGNOSIS — B950 Streptococcus, group A, as the cause of diseases classified elsewhere: Secondary | ICD-10-CM | POA: Diagnosis not present

## 2016-02-26 DIAGNOSIS — Z9911 Dependence on respirator [ventilator] status: Secondary | ICD-10-CM | POA: Diagnosis not present

## 2016-02-26 DIAGNOSIS — Z8042 Family history of malignant neoplasm of prostate: Secondary | ICD-10-CM

## 2016-02-26 DIAGNOSIS — J9600 Acute respiratory failure, unspecified whether with hypoxia or hypercapnia: Secondary | ICD-10-CM

## 2016-02-26 DIAGNOSIS — Z4659 Encounter for fitting and adjustment of other gastrointestinal appliance and device: Secondary | ICD-10-CM

## 2016-02-26 DIAGNOSIS — Z951 Presence of aortocoronary bypass graft: Secondary | ICD-10-CM | POA: Diagnosis not present

## 2016-02-26 DIAGNOSIS — M6289 Other specified disorders of muscle: Secondary | ICD-10-CM | POA: Diagnosis present

## 2016-02-26 DIAGNOSIS — J069 Acute upper respiratory infection, unspecified: Secondary | ICD-10-CM

## 2016-02-26 DIAGNOSIS — Z01818 Encounter for other preprocedural examination: Secondary | ICD-10-CM

## 2016-02-26 DIAGNOSIS — N289 Disorder of kidney and ureter, unspecified: Secondary | ICD-10-CM

## 2016-02-26 DIAGNOSIS — Z9889 Other specified postprocedural states: Secondary | ICD-10-CM

## 2016-02-26 DIAGNOSIS — J9690 Respiratory failure, unspecified, unspecified whether with hypoxia or hypercapnia: Secondary | ICD-10-CM

## 2016-02-26 HISTORY — DX: Chronic kidney disease, unspecified: N18.9

## 2016-02-26 LAB — BASIC METABOLIC PANEL
ANION GAP: 15 (ref 5–15)
BUN: 79 mg/dL — ABNORMAL HIGH (ref 6–20)
CALCIUM: 8.6 mg/dL — AB (ref 8.9–10.3)
CHLORIDE: 90 mmol/L — AB (ref 101–111)
CO2: 25 mmol/L (ref 22–32)
CREATININE: 2.73 mg/dL — AB (ref 0.61–1.24)
GFR calc non Af Amer: 22 mL/min — ABNORMAL LOW (ref >60)
GFR, EST AFRICAN AMERICAN: 25 mL/min — AB (ref >60)
GLUCOSE: 319 mg/dL — AB (ref 65–99)
POTASSIUM: 4.3 mmol/L (ref 3.5–5.1)
SODIUM: 130 mmol/L — AB (ref 135–145)

## 2016-02-26 LAB — SYNOVIAL CELL COUNT + DIFF, W/ CRYSTALS
Crystals, Fluid: NONE SEEN
EOSINOPHILS-SYNOVIAL: 0 %
Lymphocytes-Synovial Fld: 3 %
MONOCYTE-MACROPHAGE-SYNOVIAL FLUID: 2 %
Neutrophil, Synovial: 95 %
OTHER CELLS-SYN: 0
WBC, Synovial: 346786 /mm3 — ABNORMAL HIGH (ref 0–200)

## 2016-02-26 LAB — GLUCOSE, CAPILLARY
GLUCOSE-CAPILLARY: 354 mg/dL — AB (ref 65–99)
Glucose-Capillary: 363 mg/dL — ABNORMAL HIGH (ref 65–99)

## 2016-02-26 LAB — CBC
HCT: 35.5 % — ABNORMAL LOW (ref 40.0–52.0)
HEMOGLOBIN: 11.8 g/dL — AB (ref 13.0–18.0)
MCH: 30.5 pg (ref 26.0–34.0)
MCHC: 33.3 g/dL (ref 32.0–36.0)
MCV: 91.6 fL (ref 80.0–100.0)
PLATELETS: 162 10*3/uL (ref 150–440)
RBC: 3.87 MIL/uL — AB (ref 4.40–5.90)
RDW: 13.9 % (ref 11.5–14.5)
WBC: 16.8 10*3/uL — AB (ref 3.8–10.6)

## 2016-02-26 LAB — C-REACTIVE PROTEIN: CRP: 29 mg/dL — ABNORMAL HIGH (ref <1.0)

## 2016-02-26 LAB — INFLUENZA PANEL BY PCR (TYPE A & B)
H1N1FLUPCR: NOT DETECTED
INFLAPCR: NEGATIVE
INFLBPCR: NEGATIVE

## 2016-02-26 LAB — RAPID INFLUENZA A&B ANTIGENS: Influenza A (ARMC): NEGATIVE

## 2016-02-26 LAB — TROPONIN I: Troponin I: 2.9 ng/mL — ABNORMAL HIGH (ref <0.031)

## 2016-02-26 LAB — SEDIMENTATION RATE: SED RATE: 82 mm/h — AB (ref 0–20)

## 2016-02-26 LAB — LACTIC ACID, PLASMA: Lactic Acid, Venous: 2.5 mmol/L (ref 0.5–2.0)

## 2016-02-26 LAB — RAPID INFLUENZA A&B ANTIGENS (ARMC ONLY): INFLUENZA B (ARMC): NEGATIVE

## 2016-02-26 MED ORDER — SODIUM CHLORIDE 0.9 % IV BOLUS (SEPSIS)
1000.0000 mL | Freq: Once | INTRAVENOUS | Status: AC
  Administered 2016-02-26: 1000 mL via INTRAVENOUS

## 2016-02-26 MED ORDER — BUPIVACAINE HCL (PF) 0.5 % IJ SOLN
30.0000 mL | Freq: Once | INTRAMUSCULAR | Status: DC
  Filled 2016-02-26: qty 30

## 2016-02-26 MED ORDER — HEPARIN SODIUM (PORCINE) 5000 UNIT/ML IJ SOLN
5000.0000 [IU] | Freq: Three times a day (TID) | INTRAMUSCULAR | Status: DC
  Administered 2016-02-26: 5000 [IU] via SUBCUTANEOUS
  Filled 2016-02-26: qty 1

## 2016-02-26 MED ORDER — MORPHINE SULFATE (PF) 2 MG/ML IV SOLN
2.0000 mg | INTRAVENOUS | Status: DC | PRN
  Administered 2016-02-27: 2 mg via INTRAVENOUS
  Filled 2016-02-26: qty 1

## 2016-02-26 MED ORDER — LIDOCAINE HCL (PF) 1 % IJ SOLN
INTRAMUSCULAR | Status: AC
  Filled 2016-02-26: qty 30

## 2016-02-26 MED ORDER — INSULIN GLARGINE 100 UNIT/ML ~~LOC~~ SOLN
15.0000 [IU] | Freq: Every evening | SUBCUTANEOUS | Status: DC
  Administered 2016-02-26 – 2016-02-27 (×2): 15 [IU] via SUBCUTANEOUS
  Filled 2016-02-26 (×3): qty 0.15

## 2016-02-26 MED ORDER — HALOPERIDOL LACTATE 5 MG/ML IJ SOLN: 1.0000 mg | Freq: Four times a day (QID) | INTRAMUSCULAR | Status: DC | PRN

## 2016-02-26 MED ORDER — ASPIRIN EC 81 MG PO TBEC
81.0000 mg | DELAYED_RELEASE_TABLET | Freq: Every day | ORAL | Status: DC
  Administered 2016-02-26 – 2016-02-27 (×2): 81 mg via ORAL
  Filled 2016-02-26 (×2): qty 1

## 2016-02-26 MED ORDER — VANCOMYCIN HCL 10 G IV SOLR
1250.0000 mg | INTRAVENOUS | Status: DC
  Administered 2016-02-27: 1250 mg via INTRAVENOUS
  Filled 2016-02-26: qty 1250

## 2016-02-26 MED ORDER — GABAPENTIN 300 MG PO CAPS
600.0000 mg | ORAL_CAPSULE | Freq: Every day | ORAL | Status: DC
  Administered 2016-02-26: 600 mg via ORAL
  Filled 2016-02-26: qty 2

## 2016-02-26 MED ORDER — VANCOMYCIN HCL IN DEXTROSE 1-5 GM/200ML-% IV SOLN
1000.0000 mg | Freq: Once | INTRAVENOUS | Status: AC
  Administered 2016-02-26: 1000 mg via INTRAVENOUS
  Filled 2016-02-26: qty 200

## 2016-02-26 MED ORDER — IRBESARTAN 75 MG PO TABS
150.0000 mg | ORAL_TABLET | Freq: Every day | ORAL | Status: DC
  Administered 2016-02-26: 150 mg via ORAL
  Filled 2016-02-26: qty 1

## 2016-02-26 MED ORDER — ACETAMINOPHEN 325 MG PO TABS: 650.0000 mg | ORAL_TABLET | Freq: Four times a day (QID) | ORAL | Status: DC | PRN

## 2016-02-26 MED ORDER — AMLODIPINE BESYLATE 5 MG PO TABS
5.0000 mg | ORAL_TABLET | Freq: Every day | ORAL | Status: DC
  Administered 2016-02-26: 5 mg via ORAL
  Filled 2016-02-26: qty 1

## 2016-02-26 MED ORDER — FENTANYL CITRATE (PF) 100 MCG/2ML IJ SOLN
75.0000 ug | Freq: Once | INTRAMUSCULAR | Status: AC
  Administered 2016-02-26: 75 ug via INTRAVENOUS
  Filled 2016-02-26: qty 2

## 2016-02-26 MED ORDER — METOPROLOL TARTRATE 25 MG PO TABS
25.0000 mg | ORAL_TABLET | Freq: Four times a day (QID) | ORAL | Status: DC
  Administered 2016-02-26: 25 mg via ORAL
  Filled 2016-02-26 (×2): qty 1

## 2016-02-26 MED ORDER — LIDOCAINE-EPINEPHRINE (PF) 1 %-1:200000 IJ SOLN
INTRAMUSCULAR | Status: AC
  Administered 2016-02-26: 30 mL
  Filled 2016-02-26: qty 30

## 2016-02-26 MED ORDER — PRAVASTATIN SODIUM 20 MG PO TABS
40.0000 mg | ORAL_TABLET | Freq: Every day | ORAL | Status: DC
  Administered 2016-02-28 – 2016-03-05 (×7): 40 mg via ORAL
  Filled 2016-02-26: qty 2
  Filled 2016-02-26: qty 1
  Filled 2016-02-26 (×6): qty 2

## 2016-02-26 MED ORDER — LIDOCAINE HCL (PF) 1 % IJ SOLN: 30.0000 mL | Freq: Once | INTRAMUSCULAR | Status: DC

## 2016-02-26 MED ORDER — PIPERACILLIN-TAZOBACTAM 3.375 G IVPB
3.3750 g | Freq: Three times a day (TID) | INTRAVENOUS | Status: DC
  Administered 2016-02-26 – 2016-02-27 (×4): 3.375 g via INTRAVENOUS
  Filled 2016-02-26 (×5): qty 50

## 2016-02-26 MED ORDER — ONDANSETRON HCL 4 MG PO TABS: 4.0000 mg | ORAL_TABLET | Freq: Four times a day (QID) | ORAL | Status: DC | PRN

## 2016-02-26 MED ORDER — LIDOCAINE HCL (PF) 1 % IJ SOLN
30.0000 mL | Freq: Once | INTRAMUSCULAR | Status: AC
  Administered 2016-02-26: 30 mL via INTRADERMAL
  Filled 2016-02-26: qty 30

## 2016-02-26 MED ORDER — BISACODYL 10 MG RE SUPP: 10.0000 mg | Freq: Every day | RECTAL | Status: DC | PRN

## 2016-02-26 MED ORDER — GABAPENTIN 300 MG PO CAPS: 300.0000 mg | ORAL_CAPSULE | ORAL | Status: DC

## 2016-02-26 MED ORDER — ACETAMINOPHEN 650 MG RE SUPP: 650.0000 mg | Freq: Four times a day (QID) | RECTAL | Status: DC | PRN

## 2016-02-26 MED ORDER — SODIUM CHLORIDE 0.9 % IV SOLN
INTRAVENOUS | Status: DC
  Administered 2016-02-26 – 2016-02-28 (×4): via INTRAVENOUS

## 2016-02-26 MED ORDER — CEFTRIAXONE SODIUM 2 G IJ SOLR
2.0000 g | Freq: Once | INTRAMUSCULAR | Status: AC
  Administered 2016-02-26: 2 g via INTRAVENOUS
  Filled 2016-02-26 (×2): qty 2

## 2016-02-26 MED ORDER — SODIUM CHLORIDE 0.9% FLUSH
3.0000 mL | Freq: Two times a day (BID) | INTRAVENOUS | Status: DC
  Administered 2016-02-27 – 2016-03-11 (×25): 3 mL via INTRAVENOUS

## 2016-02-26 MED ORDER — ISOSORBIDE MONONITRATE ER 30 MG PO TB24
30.0000 mg | ORAL_TABLET | Freq: Every day | ORAL | Status: DC
  Administered 2016-02-26: 30 mg via ORAL
  Filled 2016-02-26: qty 1

## 2016-02-26 MED ORDER — MOMETASONE FURO-FORMOTEROL FUM 200-5 MCG/ACT IN AERO
2.0000 | INHALATION_SPRAY | Freq: Two times a day (BID) | RESPIRATORY_TRACT | Status: DC
  Administered 2016-02-26 – 2016-02-27 (×2): 2 via RESPIRATORY_TRACT
  Filled 2016-02-26: qty 8.8

## 2016-02-26 MED ORDER — INSULIN ASPART 100 UNIT/ML ~~LOC~~ SOLN
0.0000 [IU] | Freq: Three times a day (TID) | SUBCUTANEOUS | Status: DC
  Filled 2016-02-26: qty 1

## 2016-02-26 MED ORDER — ONDANSETRON HCL 4 MG/2ML IJ SOLN: 4.0000 mg | Freq: Four times a day (QID) | INTRAMUSCULAR | Status: DC | PRN

## 2016-02-26 MED ORDER — IPRATROPIUM-ALBUTEROL 0.5-2.5 (3) MG/3ML IN SOLN
3.0000 mL | Freq: Four times a day (QID) | RESPIRATORY_TRACT | Status: DC
  Administered 2016-02-26 – 2016-03-05 (×31): 3 mL via RESPIRATORY_TRACT
  Filled 2016-02-26 (×30): qty 3

## 2016-02-26 MED ORDER — DOCUSATE SODIUM 100 MG PO CAPS
100.0000 mg | ORAL_CAPSULE | Freq: Two times a day (BID) | ORAL | Status: DC
  Administered 2016-02-26 – 2016-02-27 (×2): 100 mg via ORAL
  Filled 2016-02-26 (×2): qty 1

## 2016-02-26 MED ORDER — SODIUM CHLORIDE 0.9 % IV BOLUS (SEPSIS)
1000.0000 mL | Freq: Once | INTRAVENOUS | Status: AC
  Administered 2016-02-27: 1000 mL via INTRAVENOUS

## 2016-02-26 MED ORDER — GABAPENTIN 300 MG PO CAPS
300.0000 mg | ORAL_CAPSULE | Freq: Two times a day (BID) | ORAL | Status: DC
Start: 2016-02-27 — End: 2016-02-27
  Administered 2016-02-27: 300 mg via ORAL
  Filled 2016-02-26: qty 1

## 2016-02-26 NOTE — Op Note (Signed)
*   No surgery found *  5:40 PM  PATIENT:  John Schultz  75 y.o. male  PRE-OPERATIVE DIAGNOSIS:  Septic right knee  POST-OPERATIVE DIAGNOSIS:  Septic right knee  PROCEDURE:  * No surgery found * arthroscopic irrigation of right septic knee  SURGEON: Laurene Footman, MD  ASSISTANTS: None  ANESTHESIA:   local  EBL:     BLOOD ADMINISTERED:none  DRAINS: (2) Hemovact drain(s) in the Right knee joint with  Suction Open   LOCAL MEDICATIONS USED:  XYLOCAINE   SPECIMEN:  No Specimen  DISPOSITION OF SPECIMEN:  N/A  COUNTS:  YES  TOURNIQUET:  * No surgery found *  IMPLANTS: None  DICTATION: .Dragon Dictation based on patient's medical condition and the fact that he had gross pus within the knee is felt he needed arthroscopic irrigation but is medical condition made going to the operating room risky. Informed consent was obtained for arthroscopic irrigation within the ER. 15 cc of 1% Xylocaine was infiltrated superior medially where he had prior aspiration and also inferior medially where he had also had aspiration. After allowing this to set skin was prepped with ChloraPrep. Timeout procedure patient identification completed. Stab incisions were made with an 11 blade and trochars placed superior medially and inferior medial.. Saline was hooked into the superior medial portal and suction tubing to the inferior medial portal and 6 L of saline were irrigated through the knee with initial gross pus being present at 3 L marked is clear fluid and an additional 3 L of clear fluid ran through the knee. After 6 L had come through the knee and no gross pus present the medium Hemovacs were placed through the cannulas and the cannulas removed. Sterile dressings of 4 x 4 ABDs and a Kerlix wrap were placed and the Hemovac hooked to suction. The patient tolerated the procedure well there were no specimens as prior cultures had been obtained. He is being admitted after this  PLAN OF CARE: Admit to  inpatient   PATIENT DISPOSITION:  Stable postop

## 2016-02-26 NOTE — ED Provider Notes (Signed)
[facility] Emergency Department Provider Note  ____________________________________________  Time seen: Approximately 3:07 PM  I have reviewed the triage vital signs and the nursing notes.   HISTORY  Chief Complaint Influenza and Knee Pain    HPI John Schultz is a 75 y.o. male with a history of DM, CHF, aortoiliac bypass presenting with cough, congestion, fever and chills as well as severe right knee pain and erythema. The patient reports that for the past several days he has had cough that is nonproductive with a clear rhinorrhea associated with fever to 103 and chills. Over the past several days he has also noted a severe right knee pain, now unable to bear weight. He denies any discharge from his penis or treatment for gonorrhea in the past. He has some mild shortness of breath and diffuse body aches. No known sick contacts.   Past Medical History  Diagnosis Date  . CHF (congestive heart failure) ([place])   . DM (diabetes mellitus) ([place])   . Pancreatic cyst   . Skin cancer   . DDD (degenerative disc disease), thoracic   . Renal failure   . Respiratory failure ([place])     There are no active problems to display for this patient.   Past Surgical History  Procedure Laterality Date  . Aortoiliac bypass    . Cholecystectomy    . Kyphoplasty      No current outpatient prescriptions on file.  Allergies Review of patient's allergies indicates no known allergies.  History reviewed. No pertinent family history.  Social History Social History  Substance Use Topics  . Smoking status: Never Smoker   . Smokeless tobacco: Never Used  . Alcohol Use: No    Review of Systems Constitutional: Positive fever and chills. Positive myalgias positive diffuse fatigue. Eyes: No visual changes. No eye discharge. ENT: No sore throat. Positive congestion or rhinorrhea. Cardiovascular: Denies chest pain. Denies palpitations. Respiratory: Denies shortness of  breath.  Positive cough. Gastrointestinal: No abdominal pain.  No nausea, no vomiting.  No diarrhea.  No constipation. Genitourinary: Negative for dysuria. Musculoskeletal: Negative for back pain. Positive for right knee pain. Skin: Operative for rash. Neurological: Negative for headaches. No focal numbness, tingling or weakness.   10-point ROS otherwise negative.  ____________________________________________   PHYSICAL EXAM:  VITAL SIGNS: ED Triage Vitals  Enc Vitals Group     BP 03/21/2016 1223 103/59 mmHg     Pulse Rate 03/17/2016 1223 87     Resp 03/17/2016 1300 17     Temp 03/05/2016 1223 97.9 F (36.6 C)     Temp Source 03/19/2016 1223 Oral     SpO2 03/13/2016 1223 99 %     Weight 03/11/2016 1223 180 lb (81.647 kg)     Height 03/03/2016 1223 5\' 7"  (1.702 m)     Head Cir --      Peak Flow --      Pain Score 02/28/2016 1213 6     Pain Loc --      Pain Edu? --      Excl. in [place]? --     Constitutional: Patient is alert and oriented and answering questions appropriately. He is uncomfortable appearing. Eyes: Conjunctivae are normal.  EOMI. No scleral icterus. Head: Atraumatic. Nose: No congestion/rhinnorhea. Mouth/Throat: Mucous membranes are moist.  Neck: No stridor.  Supple.  No meningismus. Cardiovascular: Normal rate, regular rhythm. No murmurs, rubs or gallops.  Respiratory: Patient is tachypnea but able to speak in 4-5 word sentences. He has  accessory muscle use and mild retractions. He has clear lung fields bilaterally without wheezes rales or Rales. Gastrointestinal: Soft, nontender and nondistended.  No guarding or rebound.  No peritoneal signs. Musculoskeletal: No LE edema. No ttp in the calves or palpable cords.  Negative Homan's sign. +6 x 3" erythema and warmth to the superior medial knee just above the patella and including heart the patella without significant effusion. Severe pain with any movement of the right knee. Neurologic:  A&Ox3.  Speech is clear.  Face and smile are  symmetric.  EOMI.  Moves all extremities well. Skin:  Skin is warm, dry and intact. No rash noted. Psychiatric: Mood and affect are normal. Speech and behavior are normal.  Normal judgement.  ____________________________________________   LABS (all labs ordered are listed, but only abnormal results are displayed)  Labs Reviewed  CBC - Abnormal; Notable for the following:    WBC 16.8 (*)    RBC 3.87 (*)    Hemoglobin 11.8 (*)    HCT 35.5 (*)    All other components within normal limits  BASIC METABOLIC PANEL - Abnormal; Notable for the following:    Sodium 130 (*)    Chloride 90 (*)    Glucose, Bld 319 (*)    BUN 79 (*)    Creatinine, Ser 2.73 (*)    Calcium 8.6 (*)    GFR calc non Af Amer 22 (*)    GFR calc Af Amer 25 (*)    All other components within normal limits  SEDIMENTATION RATE - Abnormal; Notable for the following:    Sed Rate 82 (*)    All other components within normal limits  RAPID INFLUENZA A&B ANTIGENS ([facility] ONLY)  CULTURE, BLOOD (ROUTINE X 2)  CULTURE, BLOOD (ROUTINE X 2)  URINE CULTURE  BODY FLUID CULTURE  GRAM STAIN  BODY FLUID CULTURE  GRAM STAIN  C-REACTIVE PROTEIN  URINALYSIS COMPLETEWITH MICROSCOPIC ([facility] ONLY)  CSF CELL COUNT WITH DIFFERENTIAL  SYNOVIAL CELL COUNT + DIFF, W/ CRYSTALS  BODY FLUID CELL COUNT WITH DIFFERENTIAL   ____________________________________________  EKG  See below  ____________________________________________  RADIOLOGY  Dg Chest 2 View  03/12/2016  CLINICAL DATA:  Nausea, vomiting, diarrhea and cough. Also weakness, congestion, fever and chills. History of CHF, diabetes. EXAM: CHEST  2 VIEW COMPARISON:  Chest x-rays dated 04/24/2015 and 03/20/2015. FINDINGS: Study is again hypoinspiratory with crowding of the perihilar bronchovascular markings. Given the low lung volumes, lungs appear clear. Cardiomediastinal silhouette is stable in size and configuration. Median sternotomy wires are stable in alignment. Multiple old  compression fracture deformities are seen within the upper lumbar spine, 1 of which is status post previous vertebroplasty. No evidence of acute osseous abnormality. Surgical clips again noted within the upper abdomen. IMPRESSION: Low lung volumes. No evidence of acute cardiopulmonary abnormality. No evidence of pneumonia. Electronically Signed   By: Franki Cabot M.D.   On: 03/06/2016 13:40   Dg Knee Complete 4 Views Right  03/04/2016  CLINICAL DATA:  Acute knee pain.  Initial encounter. EXAM: RIGHT KNEE - COMPLETE 4+ VIEW COMPARISON:  None. FINDINGS: There is no evidence of acute fracture, subluxation or dislocation. A small knee effusion is present. No focal bony lesions are present. Heavy vascular calcifications are present. IMPRESSION: Small knee effusion without acute bony abnormality. Heavy vascular calcifications. Electronically Signed   By: Margarette Canada M.D.   On: 03/14/2016 13:39    ____________________________________________   PROCEDURES  Procedure(s) performed: Right Knee Arthrocentesis, see procedure note(s).  Critical Care performed: No ____________________________________________   INITIAL IMPRESSION / ASSESSMENT AND PLAN / ED COURSE  Pertinent labs & imaging results that were available during my care of the patient were reviewed by me and considered in my medical decision making (see chart for details).  75 y.o. male presenting with cough, congestion, nausea and vomiting, fever, and severe right knee pain with overlying erythema. I'm concerned that the patient has a septic arthritis in his right knee. Also consider cellulitis versus possibly gout. He also has signs and symptoms that are consistent with influenza or an influenza-like illness, but I will evaluate him for pneumonia as well. Patient clinically has signs and symptoms consistent with dehydration. He will likely require admission to the hospital.  ----------------------------------------- 3:23 PM on  02/29/2016 -----------------------------------------  ARTHOCENTESIS Performed by: Eula Listen Consent: Verbal consent obtained. Risks and benefits: risks, benefits and alternatives were discussed Consent given by: patient Required items: required blood products, implants, devices, and special equipment available Patient identity confirmed: verbally with patient Time out: Immediately prior to procedure a "time out" was called to verify the correct patient, procedure, equipment, support staff and site/side marked as required. Indications: Rule out septic arthritis  Joint: Right knee Local anesthesia used: 1% with epi  Preparation: Patient was prepped and draped in the usual sterile fashion. Aspirate appearance: Yellowish, blood-tinged  Aspirate amount: <1 ml; almost dry tap. Patient tolerance: Patient tolerated the procedure well with no immediate complications.   The patient's chest x-ray does not show pneumonia. His knee is concerning, and has a small effusion on his x-ray with a sedimentation rate of 82. I have performed his arthrocentesis, spoken with Dr. Rudene Christians who will evaluate the patient, and initiated IV antibiotics for treatment of septic arthritis.   ----------------------------------------- 4:18 PM on 03/07/2016 -----------------------------------------  Patient has been seen and examined by Dr. Rudene Christians who does agree that the patient likely has a septic arthritis. He was able to do an arthrocentesis using a superior medial approach and returned purulent fluid area did I'll plan to admit the patient. At this time, it is unlikely that the patient is safe for surgery today, so Dr. Rudene Christians will likely wash them out at bedside.   In addition, the patient has had several bouts of a wide complex tachycardia since arriving and we were able to capture 1 and a twelve-lead. The following is the results of the EKG.   ED ECG REPORT I, Eula Listen, the attending physician,  personally viewed and interpreted this ECG.   Date: 03/14/2016  EKG Time: 1539  Rate: No weight  Rhythm: sinus tachycardia  Axis: Normal  Intervals:none and Prolonged QTC  ST&T Change: No ST elevation but the patient does have wide complex tachycardia exhibited in V1 and V2. 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

## 2016-02-26 NOTE — Progress Notes (Addendum)
CRITICAL VALUE ALERT  Critical value received: Lactic Acid 2.5, Trop 2.9   Date of notification: 03/11/2016  Time of notification: 2000  Critical value read back: yes   Nurse who received alert: Kathrin Greathouse  MD notified (1st page):  Prime Doc, Dr.Vachhani  Time of first page: 2005  Prime Doc [place] 2010  No new orders given. Nursing will continue to monitor. Patient change in mental status from baseline. Patient alert to self. Patient resp elevated. Patient breathing with mouth open. Patient breathing labored. Patient denies  SOB or trouble breathing. O2 sats WNL

## 2016-02-26 NOTE — ED Notes (Signed)
Pt O2 sats at 87% MD made aware, pt placed on 2L nasal canula.

## 2016-02-26 NOTE — Progress Notes (Signed)
Pt arrived to floor via stretcher from ED with a septic knee and flu like symptoms. sats in the mid 80's on 2L now on 4L at 96%. Breathing noted to be shallow and fast at 30 breaths a minute currently afebrile. Telemetry applied and pt is sinus tach in 120"s. Pts wife in the room during assessment and stated that at his last admission for CHF he became confused at night. States he got out of bed and pulled his IV lines out. All information has been passed on to on coming nurse as well as charge nurse.

## 2016-02-26 NOTE — Op Note (Signed)
Right knee aspirated with local anesthetic. 20 CC of purulent fluid withdrawn and sent to lab. Recommend arthoscopic lavage.

## 2016-02-26 NOTE — Progress Notes (Signed)
Patient did not receive venous blood gas labs in ED. Patient afebrile. SpO2 4 L [place]. Resp elevated. Dr. Clayton Bibles notified placing new orders. Charge nurse Helene Kelp notified and called placed to [place] notified.

## 2016-02-26 NOTE — H&P (Signed)
History and Physical    John Schultz [id number] DOB: Dec 31, 1940 DOA: 02/27/2016  Referring physician: Dr. Mariea Clonts PCP: Dion Body, MD  Specialists: none  Chief Complaint: knee pain and fever  HPI: John Schultz is a 75 y.o. male has a past medical history significant for HTN, chronic diastolic CHF, CKD, and DM who presents with 4 day hx of progressive right knee pain now with fever, SOB, and cough. Has had chills and sweats. Right knee has been red and swollen. Having difficulty walking. Presented to ER where he was noted to be tachycardic with a leukocytosis. Puss drained from right knee. EKG was abnormal., Hyponatremia also noted. He is now admitted. Had vomiting Friday night but none since. Denies CP. Cough is non-productive. EKG reveals a wide-complex tachycardia.  Review of Systems: The patient denies anorexia, weight loss,, vision loss, decreased hearing, hoarseness, chest pain, syncope, dyspnea on exertion, peripheral edema, balance deficits, hemoptysis, abdominal pain, melena, hematochezia, severe indigestion/heartburn, hematuria, incontinence, genital sores, muscle weakness, suspicious skin lesions, transient blindness, depression, unusual weight change, abnormal bleeding, enlarged lymph nodes, angioedema, and breast masses.   Past Medical History  Diagnosis Date  . CHF (congestive heart failure) ([place])   . DM (diabetes mellitus) ([place])   . Pancreatic cyst   . Skin cancer   . DDD (degenerative disc disease), thoracic   . Renal failure   . Respiratory failure [facility])    Past Surgical History  Procedure Laterality Date  . Aortoiliac bypass    . Cholecystectomy    . Kyphoplasty     Social History:  reports that he has never smoked. He has never used smokeless tobacco. He reports that he does not drink alcohol or use illicit drugs.  Allergies  Allergen Reactions  . Tape Rash    Family History  Problem Relation Age of Onset  . CAD Mother   . Hypertension  Mother   . Hypertension Father   . Prostate cancer Father   . Colon cancer Brother     Prior to Admission medications   Medication Sig Start Date End Date Taking? Authorizing Provider  isosorbide mononitrate (IMDUR) 30 MG 24 hr tablet Take 30 mg by mouth daily. 12/14/15  Yes Historical Provider, MD  valsartan (DIOVAN) 160 MG tablet Take 80-160 mg by mouth See admin instructions. Take 1/2 tablet (80mg ) by mouth in the morning, and 1 tablet by mouth every night at bedtime. 11/15/15  Yes Historical Provider, MD   Physical Exam: Filed Vitals:   03/15/2016 1223 03/23/2016 1300 02/27/2016 1330  BP: 103/59 114/60 121/50  Pulse: 87 87 95  Temp: 97.9 F (36.6 C)    TempSrc: Oral    Resp:  17 24  Height: 5\' 7"  (1.702 m)    Weight: 81.647 kg (180 lb)    SpO2: 99% 98% 96%     General:  WDWM, [place]/AT, acutely ill appearing in moderate distress  Eyes: PERRL, EOMI, no scleral icterus, conjunctiva clear  ENT: moist oropharynx without exudate or lesions. Dentition good. TM's benign  Neck: supple, no lymphadenopathy. No thyromegaly or bruits  Cardiovascular: rapid rate with regular rhythm without MRG; 2+ peripheral pulses, no JVD, no peripheral edema  Respiratory: diffuse rhonchi without wheezes or rale. No dullness. Respiratory effort increased  Abdomen: soft, non tender to palpation, positive bowel sounds, no guarding, no rebound, no organomegaly  Skin: no rashes or lesions  Musculoskeletal: normal bulk and tone, + joint swelling of right knee with erythema and tenderness to palpation  Psychiatric: normal mood and affect, A&OX3  Neurologic: CN 2-12 grossly intact, Motor strength 5/5 in all 4 groups with symmetric DTR's and normal sensory exam  Labs on Admission:  Basic Metabolic Panel:  Recent Labs Lab 03/08/2016 1339  NA 130*  K 4.3  CL 90*  CO2 25  GLUCOSE 319*  BUN 79*  CREATININE 2.73*  CALCIUM 8.6*   Liver Function Tests: No results for input(s): AST, ALT, ALKPHOS, BILITOT,  PROT, ALBUMIN in the last 168 hours. No results for input(s): LIPASE, AMYLASE in the last 168 hours. No results for input(s): AMMONIA in the last 168 hours. CBC:  Recent Labs Lab 03/08/2016 1339  WBC 16.8*  HGB 11.8*  HCT 35.5*  MCV 91.6  PLT 162   Cardiac Enzymes: No results for input(s): CKTOTAL, CKMB, CKMBINDEX, TROPONINI in the last 168 hours.  BNP (last 3 results)  Recent Labs  04/24/15 1525  BNP 703.0*    ProBNP (last 3 results) No results for input(s): PROBNP in the last 8760 hours.  CBG: No results for input(s): GLUCAP in the last 168 hours.  Radiological Exams on Admission: Dg Chest 2 View  03/09/2016  CLINICAL DATA:  Nausea, vomiting, diarrhea and cough. Also weakness, congestion, fever and chills. History of CHF, diabetes. EXAM: CHEST  2 VIEW COMPARISON:  Chest x-rays dated 04/24/2015 and 03/20/2015. FINDINGS: Study is again hypoinspiratory with crowding of the perihilar bronchovascular markings. Given the low lung volumes, lungs appear clear. Cardiomediastinal silhouette is stable in size and configuration. Median sternotomy wires are stable in alignment. Multiple old compression fracture deformities are seen within the upper lumbar spine, 1 of which is status post previous vertebroplasty. No evidence of acute osseous abnormality. Surgical clips again noted within the upper abdomen. IMPRESSION: Low lung volumes. No evidence of acute cardiopulmonary abnormality. No evidence of pneumonia. Electronically Signed   By: Franki Cabot M.D.   On: 02/25/2016 13:40   Dg Knee Complete 4 Views Right  03/06/2016  CLINICAL DATA:  Acute knee pain.  Initial encounter. EXAM: RIGHT KNEE - COMPLETE 4+ VIEW COMPARISON:  None. FINDINGS: There is no evidence of acute fracture, subluxation or dislocation. A small knee effusion is present. No focal bony lesions are present. Heavy vascular calcifications are present. IMPRESSION: Small knee effusion without acute bony abnormality. Heavy vascular  calcifications. Electronically Signed   By: Margarette Canada M.D.   On: 03/24/2016 13:39    EKG: Independently reviewed.  Assessment/Plan Principal Problem:   Septic arthritis of knee, right (HCC) Active Problems:   ARF (acute renal failure) (HCC)   Acute bronchitis   Hyponatremia   Will admit to floor with IV fluids and IV ABX. Cultures sent. Consult orthopedics. Follow sugars. Order echo and Cardiology consult. Repeat labs in AM. Nephrology consult for ARF  Diet: clear liquids Fluids: NS@100  DVT Prophylaxis: SQ Heparin  Code Status: FULL  Family Communication: yes  Disposition Plan: home  Time spent: 50 min

## 2016-02-26 NOTE — Progress Notes (Signed)
Pharmacy Antibiotic Note  John Schultz is a 75 y.o. male admitted on 03/18/2016 with septic joint.  Pharmacy has been consulted for Vancomycin dosing. Patient is also ordered Zosyn 3.375g IV q8h EI. Received Vancomycin 1g IV in the ED.  Ke=0.025 T1/2=27.7hr Vd=57.1 L  Plan: Vancomycin 1250mg  IV every 36 hours.  Goal trough 15-20 mcg/mL.  Height: 5\' 7"  (170.2 cm) Weight: 180 lb (81.647 kg) IBW/kg (Calculated) : 66.1  Temp (24hrs), Avg:97.9 F (36.6 C), Min:97.9 F (36.6 C), Max:97.9 F (36.6 C)   Recent Labs Lab 03/18/2016 1339  WBC 16.8*  CREATININE 2.73*    Estimated Creatinine Clearance: 24.6 mL/min (by C-G formula based on Cr of 2.73).    Allergies  Allergen Reactions  . Tape Rash    Antimicrobials this admission: Vancomycin 4/2 >>  Zosyn 4/2 >>   Dose adjustments this admission:  Microbiology results:  Thank you for allowing pharmacy to be a part of this patient's care.  Paulina Fusi, PharmD, BCPS 03/09/2016 6:31 PM

## 2016-02-26 NOTE — Progress Notes (Signed)
Pt has labored breathing resp. 32-40. Elevated troponin and lactic acid. Pt is confused at this time. He does get confused at night sometimes. Nsg supervisor notified.

## 2016-02-26 NOTE — ED Notes (Signed)
Patient arrival from home via POV with c/o N/V/D Cough Congestion Fever, Chills.Marland KitchenMarland KitchenMarland KitchenAnd also right knee pain with movement and ambulation. Patient hx of CHF Diabetes

## 2016-02-27 ENCOUNTER — Inpatient Hospital Stay: Payer: Commercial Managed Care - HMO

## 2016-02-27 DIAGNOSIS — M009 Pyogenic arthritis, unspecified: Secondary | ICD-10-CM

## 2016-02-27 DIAGNOSIS — J9600 Acute respiratory failure, unspecified whether with hypoxia or hypercapnia: Secondary | ICD-10-CM | POA: Insufficient documentation

## 2016-02-27 DIAGNOSIS — R6521 Severe sepsis with septic shock: Secondary | ICD-10-CM

## 2016-02-27 DIAGNOSIS — I214 Non-ST elevation (NSTEMI) myocardial infarction: Secondary | ICD-10-CM | POA: Diagnosis present

## 2016-02-27 DIAGNOSIS — J9601 Acute respiratory failure with hypoxia: Secondary | ICD-10-CM

## 2016-02-27 DIAGNOSIS — A419 Sepsis, unspecified organism: Secondary | ICD-10-CM

## 2016-02-27 DIAGNOSIS — L8990 Pressure ulcer of unspecified site, unspecified stage: Secondary | ICD-10-CM | POA: Insufficient documentation

## 2016-02-27 LAB — BLOOD GAS, ARTERIAL
ACID-BASE DEFICIT: 4.3 mmol/L — AB (ref 0.0–2.0)
ACID-BASE DEFICIT: 2.5 mmol/L — AB (ref 0.0–2.0)
ALLENS TEST (PASS/FAIL): POSITIVE — AB
Acid-base deficit: 6.2 mmol/L — ABNORMAL HIGH (ref 0.0–2.0)
BICARBONATE: 23.2 meq/L (ref 21.0–28.0)
Bicarbonate: 19.8 mEq/L — ABNORMAL LOW (ref 21.0–28.0)
Bicarbonate: 18.9 mEq/L — ABNORMAL LOW (ref 21.0–28.0)
FIO2: 0.36
FIO2: 0.4
FIO2: 0.35
MECHVT: 500 mL
O2 SAT: 98.4 %
O2 SAT: 96.7 %
O2 SAT: 96.6 %
PATIENT TEMPERATURE: 37
PATIENT TEMPERATURE: 37
PCO2 ART: 32 mmHg (ref 32.0–48.0)
PEEP/CPAP: 5 cmH2O
PO2 ART: 92 mmHg (ref 83.0–108.0)
Patient temperature: 37
RATE: 22 resp/min
pCO2 arterial: 43 mmHg (ref 32.0–48.0)
pCO2 arterial: 35 mmHg (ref 32.0–48.0)
pH, Arterial: 7.4 (ref 7.350–7.450)
pH, Arterial: 7.34 — ABNORMAL LOW (ref 7.350–7.450)
pH, Arterial: 7.34 — ABNORMAL LOW (ref 7.350–7.450)
pO2, Arterial: 113 mmHg — ABNORMAL HIGH (ref 83.0–108.0)
pO2, Arterial: 93 mmHg (ref 83.0–108.0)

## 2016-02-27 LAB — COMPREHENSIVE METABOLIC PANEL
ALBUMIN: 2.4 g/dL — AB (ref 3.5–5.0)
ALT: 21 U/L (ref 17–63)
ANION GAP: 15 (ref 5–15)
AST: 51 U/L — AB (ref 15–41)
Alkaline Phosphatase: 58 U/L (ref 38–126)
BUN: 76 mg/dL — AB (ref 6–20)
CHLORIDE: 97 mmol/L — AB (ref 101–111)
CO2: 20 mmol/L — AB (ref 22–32)
Calcium: 7.6 mg/dL — ABNORMAL LOW (ref 8.9–10.3)
Creatinine, Ser: 2.5 mg/dL — ABNORMAL HIGH (ref 0.61–1.24)
GFR calc Af Amer: 28 mL/min — ABNORMAL LOW (ref >60)
GFR calc non Af Amer: 24 mL/min — ABNORMAL LOW (ref >60)
GLUCOSE: 374 mg/dL — AB (ref 65–99)
POTASSIUM: 4.1 mmol/L (ref 3.5–5.1)
SODIUM: 132 mmol/L — AB (ref 135–145)
Total Bilirubin: 1.4 mg/dL — ABNORMAL HIGH (ref 0.3–1.2)
Total Protein: 6.1 g/dL — ABNORMAL LOW (ref 6.5–8.1)

## 2016-02-27 LAB — CBC
HEMATOCRIT: 33 % — AB (ref 40.0–52.0)
HEMATOCRIT: 33.2 % — AB (ref 40.0–52.0)
HEMOGLOBIN: 10.9 g/dL — AB (ref 13.0–18.0)
HEMOGLOBIN: 11.1 g/dL — AB (ref 13.0–18.0)
MCH: 31.2 pg (ref 26.0–34.0)
MCH: 30.4 pg (ref 26.0–34.0)
MCHC: 33 g/dL (ref 32.0–36.0)
MCHC: 33.6 g/dL (ref 32.0–36.0)
MCV: 92.2 fL (ref 80.0–100.0)
MCV: 93 fL (ref 80.0–100.0)
Platelets: 174 10*3/uL (ref 150–440)
Platelets: 158 10*3/uL (ref 150–440)
RBC: 3.57 MIL/uL — ABNORMAL LOW (ref 4.40–5.90)
RBC: 3.58 MIL/uL — ABNORMAL LOW (ref 4.40–5.90)
RDW: 13.9 % (ref 11.5–14.5)
RDW: 13.7 % (ref 11.5–14.5)
WBC: 12.7 10*3/uL — ABNORMAL HIGH (ref 3.8–10.6)
WBC: 11.2 10*3/uL — AB (ref 3.8–10.6)

## 2016-02-27 LAB — GLUCOSE, CAPILLARY
GLUCOSE-CAPILLARY: 338 mg/dL — AB (ref 65–99)
GLUCOSE-CAPILLARY: 260 mg/dL — AB (ref 65–99)
Glucose-Capillary: 295 mg/dL — ABNORMAL HIGH (ref 65–99)
Glucose-Capillary: 206 mg/dL — ABNORMAL HIGH (ref 65–99)
Glucose-Capillary: 221 mg/dL — ABNORMAL HIGH (ref 65–99)
Glucose-Capillary: 236 mg/dL — ABNORMAL HIGH (ref 65–99)

## 2016-02-27 LAB — URINALYSIS COMPLETE WITH MICROSCOPIC (ARMC ONLY)
Bilirubin Urine: NEGATIVE
Glucose, UA: 50 mg/dL — AB
Ketones, ur: NEGATIVE mg/dL
Leukocytes, UA: NEGATIVE
Nitrite: NEGATIVE
PROTEIN: 30 mg/dL — AB
SPECIFIC GRAVITY, URINE: 1.018 (ref 1.005–1.030)
pH: 5 (ref 5.0–8.0)

## 2016-02-27 LAB — LIPID PANEL
CHOL/HDL RATIO: 2.2 ratio
CHOLESTEROL: 76 mg/dL (ref 0–200)
HDL: 35 mg/dL — ABNORMAL LOW (ref >40)
LDL Cholesterol: 22 mg/dL (ref 0–99)
Triglycerides: 95 mg/dL (ref <150)
VLDL: 19 mg/dL (ref 0–40)

## 2016-02-27 LAB — LACTIC ACID, PLASMA
LACTIC ACID, VENOUS: 2.2 mmol/L — AB (ref 0.5–2.0)
LACTIC ACID, VENOUS: 1.3 mmol/L (ref 0.5–2.0)

## 2016-02-27 LAB — MAGNESIUM: Magnesium: 1.8 mg/dL (ref 1.7–2.4)

## 2016-02-27 LAB — BASIC METABOLIC PANEL
ANION GAP: 12 (ref 5–15)
BUN: 78 mg/dL — ABNORMAL HIGH (ref 6–20)
CHLORIDE: 100 mmol/L — AB (ref 101–111)
CO2: 22 mmol/L (ref 22–32)
Calcium: 7.7 mg/dL — ABNORMAL LOW (ref 8.9–10.3)
Creatinine, Ser: 2.59 mg/dL — ABNORMAL HIGH (ref 0.61–1.24)
GFR calc non Af Amer: 23 mL/min — ABNORMAL LOW (ref >60)
GFR, EST AFRICAN AMERICAN: 27 mL/min — AB (ref >60)
GLUCOSE: 351 mg/dL — AB (ref 65–99)
Potassium: 3.9 mmol/L (ref 3.5–5.1)
Sodium: 134 mmol/L — ABNORMAL LOW (ref 135–145)

## 2016-02-27 LAB — PROTIME-INR
INR: 1.4
PROTHROMBIN TIME: 17.3 s — AB (ref 11.4–15.0)

## 2016-02-27 LAB — APTT: APTT: 34 s (ref 24–36)

## 2016-02-27 LAB — TROPONIN I
Troponin I: 7.28 ng/mL — ABNORMAL HIGH (ref <0.031)
Troponin I: 8.1 ng/mL — ABNORMAL HIGH (ref <0.031)

## 2016-02-27 LAB — PROCALCITONIN: PROCALCITONIN: 22.5 ng/mL

## 2016-02-27 LAB — HEPARIN LEVEL (UNFRACTIONATED): HEPARIN UNFRACTIONATED: 0.13 [IU]/mL — AB (ref 0.30–0.70)

## 2016-02-27 LAB — MRSA PCR SCREENING: MRSA BY PCR: NEGATIVE

## 2016-02-27 MED ORDER — INSULIN ASPART 100 UNIT/ML ~~LOC~~ SOLN
0.0000 [IU] | SUBCUTANEOUS | Status: DC
  Administered 2016-02-27: 5 [IU] via SUBCUTANEOUS
  Administered 2016-02-28: 11 [IU] via SUBCUTANEOUS
  Administered 2016-02-28: 5 [IU] via SUBCUTANEOUS
  Filled 2016-02-27: qty 5
  Filled 2016-02-27: qty 11
  Filled 2016-02-27: qty 5

## 2016-02-27 MED ORDER — CLINDAMYCIN PHOSPHATE 900 MG/50ML IV SOLN
900.0000 mg | Freq: Three times a day (TID) | INTRAVENOUS | Status: DC
  Administered 2016-02-27 – 2016-03-02 (×13): 900 mg via INTRAVENOUS
  Filled 2016-02-27 (×14): qty 50

## 2016-02-27 MED ORDER — VECURONIUM BROMIDE 10 MG IV SOLR
10.0000 mg | Freq: Once | INTRAVENOUS | Status: AC
  Administered 2016-02-27: 10 mg via INTRAVENOUS

## 2016-02-27 MED ORDER — INSULIN ASPART 100 UNIT/ML ~~LOC~~ SOLN
0.0000 [IU] | Freq: Three times a day (TID) | SUBCUTANEOUS | Status: DC
  Administered 2016-02-27 (×2): 3 [IU] via SUBCUTANEOUS
  Filled 2016-02-27 (×2): qty 3

## 2016-02-27 MED ORDER — VECURONIUM BROMIDE 10 MG IV SOLR
INTRAVENOUS | Status: AC
  Administered 2016-02-27: 10 mg via INTRAVENOUS
  Filled 2016-02-27: qty 10

## 2016-02-27 MED ORDER — NOREPINEPHRINE 4 MG/250ML-% IV SOLN
0.0000 ug/min | INTRAVENOUS | Status: DC
  Administered 2016-02-27: 2 ug/min via INTRAVENOUS
  Administered 2016-02-27: 12 ug/min via INTRAVENOUS
  Administered 2016-02-28 (×2): 15 ug/min via INTRAVENOUS
  Administered 2016-02-28: 13 ug/min via INTRAVENOUS
  Administered 2016-02-28 (×2): 15 ug/min via INTRAVENOUS
  Administered 2016-02-29: 10 ug/min via INTRAVENOUS
  Administered 2016-02-29: 15 ug/min via INTRAVENOUS
  Administered 2016-02-29: 10 ug/min via INTRAVENOUS
  Administered 2016-03-01: 8 ug/min via INTRAVENOUS
  Administered 2016-03-01: 12 ug/min via INTRAVENOUS
  Administered 2016-03-02: 8 ug/min via INTRAVENOUS
  Filled 2016-02-27 (×16): qty 250

## 2016-02-27 MED ORDER — HEPARIN BOLUS VIA INFUSION
2400.0000 [IU] | Freq: Once | INTRAVENOUS | Status: AC
  Administered 2016-02-27: 2400 [IU] via INTRAVENOUS
  Filled 2016-02-27: qty 2400

## 2016-02-27 MED ORDER — CETYLPYRIDINIUM CHLORIDE 0.05 % MT LIQD
7.0000 mL | Freq: Two times a day (BID) | OROMUCOSAL | Status: DC
  Administered 2016-02-27 (×2): 7 mL via OROMUCOSAL

## 2016-02-27 MED ORDER — SODIUM CHLORIDE 0.9 % IV BOLUS (SEPSIS)
1000.0000 mL | Freq: Once | INTRAVENOUS | Status: AC
  Administered 2016-02-27: 1000 mL via INTRAVENOUS

## 2016-02-27 MED ORDER — MIDAZOLAM HCL 2 MG/2ML IJ SOLN
INTRAMUSCULAR | Status: AC
  Administered 2016-02-27: 2 mg via INTRAVENOUS
  Filled 2016-02-27: qty 4

## 2016-02-27 MED ORDER — FENTANYL CITRATE (PF) 100 MCG/2ML IJ SOLN
50.0000 ug | Freq: Once | INTRAMUSCULAR | Status: AC
  Administered 2016-02-27: 50 ug via INTRAVENOUS

## 2016-02-27 MED ORDER — PENICILLIN G POTASSIUM 5000000 UNITS IJ SOLR
4.0000 10*6.[IU] | INTRAVENOUS | Status: DC
  Administered 2016-02-27 – 2016-02-28 (×5): 4 10*6.[IU] via INTRAVENOUS
  Filled 2016-02-27 (×9): qty 4

## 2016-02-27 MED ORDER — NOREPINEPHRINE BITARTRATE 1 MG/ML IV SOLN
0.0000 ug/min | INTRAVENOUS | Status: DC
  Filled 2016-02-27: qty 4

## 2016-02-27 MED ORDER — INSULIN ASPART 100 UNIT/ML ~~LOC~~ SOLN: 0.0000 [IU] | Freq: Every day | SUBCUTANEOUS | Status: DC

## 2016-02-27 MED ORDER — CHLORHEXIDINE GLUCONATE 0.12 % MT SOLN
15.0000 mL | Freq: Two times a day (BID) | OROMUCOSAL | Status: DC
  Administered 2016-02-27 – 2016-02-28 (×4): 15 mL via OROMUCOSAL

## 2016-02-27 MED ORDER — SODIUM CHLORIDE 0.9 % IV SOLN
750.0000 mL | INTRAVENOUS | Status: DC | PRN
  Administered 2016-02-28: 750 mL via INTRAVENOUS

## 2016-02-27 MED ORDER — MIDAZOLAM HCL 2 MG/2ML IJ SOLN
2.0000 mg | Freq: Once | INTRAMUSCULAR | Status: AC
  Administered 2016-02-27: 2 mg via INTRAVENOUS

## 2016-02-27 MED ORDER — CETYLPYRIDINIUM CHLORIDE 0.05 % MT LIQD
7.0000 mL | Freq: Two times a day (BID) | OROMUCOSAL | Status: DC
  Administered 2016-02-27 – 2016-02-28 (×3): 7 mL via OROMUCOSAL

## 2016-02-27 MED ORDER — FENTANYL CITRATE (PF) 100 MCG/2ML IJ SOLN
INTRAMUSCULAR | Status: AC
  Administered 2016-02-27: 50 ug via INTRAVENOUS
  Filled 2016-02-27: qty 4

## 2016-02-27 MED ORDER — ASPIRIN 81 MG PO CHEW
162.0000 mg | CHEWABLE_TABLET | Freq: Once | ORAL | Status: AC
  Administered 2016-02-27: 162 mg via ORAL
  Filled 2016-02-27 (×2): qty 2

## 2016-02-27 MED ORDER — STERILE WATER FOR INJECTION IJ SOLN
INTRAMUSCULAR | Status: AC
  Administered 2016-02-27: 16:00:00
  Filled 2016-02-27: qty 10

## 2016-02-27 MED ORDER — CEFAZOLIN SODIUM 1-5 GM-% IV SOLN
1.0000 g | Freq: Two times a day (BID) | INTRAVENOUS | Status: DC
  Filled 2016-02-27: qty 50

## 2016-02-27 MED ORDER — FENTANYL 2500MCG IN NS 250ML (10MCG/ML) PREMIX INFUSION
0.0000 ug/h | INTRAVENOUS | Status: DC
  Administered 2016-02-27: 40 ug/h via INTRAVENOUS
  Administered 2016-02-29: 50 ug/h via INTRAVENOUS
  Administered 2016-03-02: 40 ug/h via INTRAVENOUS
  Administered 2016-03-04: 50 ug/h via INTRAVENOUS
  Administered 2016-03-06: 35 ug/h via INTRAVENOUS
  Filled 2016-02-27 (×5): qty 250

## 2016-02-27 MED ORDER — CEFAZOLIN SODIUM 1-5 GM-% IV SOLN: 1.0000 g | Freq: Two times a day (BID) | INTRAVENOUS | Status: DC

## 2016-02-27 MED ORDER — HEPARIN (PORCINE) IN NACL 100-0.45 UNIT/ML-% IJ SOLN
1650.0000 [IU]/h | INTRAMUSCULAR | Status: DC
  Administered 2016-02-27: 1000 [IU]/h via INTRAVENOUS
  Administered 2016-02-28: 1550 [IU]/h via INTRAVENOUS
  Administered 2016-02-28: 1250 [IU]/h via INTRAVENOUS
  Administered 2016-03-01: 1650 [IU]/h via INTRAVENOUS
  Administered 2016-03-01: 1550 [IU]/h via INTRAVENOUS
  Filled 2016-02-27 (×12): qty 250

## 2016-02-27 MED ORDER — GABAPENTIN 300 MG PO CAPS: 300.0000 mg | ORAL_CAPSULE | Freq: Every day | ORAL | Status: DC

## 2016-02-27 MED ORDER — INSULIN ASPART 100 UNIT/ML ~~LOC~~ SOLN
0.0000 [IU] | Freq: Four times a day (QID) | SUBCUTANEOUS | Status: DC
Start: 2016-02-27 — End: 2016-02-27
  Administered 2016-02-27: 5 [IU] via SUBCUTANEOUS
  Filled 2016-02-27: qty 5

## 2016-02-27 MED ORDER — PANTOPRAZOLE SODIUM 40 MG IV SOLR
40.0000 mg | INTRAVENOUS | Status: DC
  Administered 2016-02-27 – 2016-03-01 (×4): 40 mg via INTRAVENOUS
  Filled 2016-02-27 (×4): qty 40

## 2016-02-27 MED ORDER — SODIUM CHLORIDE 0.9 % IV BOLUS (SEPSIS): 500.0000 mL | Freq: Once | INTRAVENOUS | Status: DC

## 2016-02-27 NOTE — Significant Event (Signed)
I was called about patient's troponin that has risen from 2.90 to 7.28. Due to patient's altered mental status, we are unable to determine if patient has chest pain but patient continues to have tachycardia and tachypnea (tachypnea may be related to patient's thrashing around). Blood pressure is stable.  I will start patient on heparin infusion (patient has a creatinine of 2.9), full dose aspirin and nitroglycerin. Continue iresatan, metoprolol and pravastatin. Cardiology consult is in place and 2-D echo is pending.

## 2016-02-27 NOTE — Progress Notes (Signed)
Inpatient Diabetes Program Recommendations  AACE/ADA: New Consensus Statement on Inpatient Glycemic Control (2015)  Target Ranges:  Prepandial:   less than 140 mg/dL      Peak postprandial:   less than 180 mg/dL (1-2 hours)      Critically ill patients:  140 - 180 mg/dL   Review of Glycemic Control  Results for SUEDE, MONIGOLD (MRN [id number]) as of 02/27/2016 08:22  Ref. Range 03/17/2016 21:10 03/02/2016 21:11 02/27/2016 03:31 02/27/2016 07:09  Glucose-Capillary Latest Ref Range: 65-99 mg/dL 354 (H) 363 (H) 338 (H) 295 (H)   Diabetes history: Type 2 A1C 8.4% on 02/20/16  Outpatient Diabetes medications: Metformin 1500mg  at supper, Novolog 5 units tid with meal- hold if blood glucose less than 80 and take 1 unit for every 50 points above 200, Lantus 15 units qhs  Current orders for Inpatient glycemic control: Lantus 15 units qhs, Novolog 0-9 units 4x/day  Inpatient Diabetes Program Recommendations: In order to get the best blood sugar control without the risk of hypoglycemia, consider putting the patient on the IV insulin/Glucostabilizer (not DKA) order set    If you do not want to put him on this protocol, consider increasing Lantus to 18 units qday.- with poor renal function, this patient is high risk for hypoglycemia if SQ insulin is given to closely or too aggressively.  Gentry Fitz, RN, BA, MHA, CDE Diabetes Coordinator Inpatient Diabetes Program  [phone] (Team Pager) [phone] ([place]) 02/27/2016 8:48 AM

## 2016-02-27 NOTE — Care Management (Signed)
Patient admitted from ED with septic joint requiring aspiration by ortho.  Patient became confused and clammy.  An initial troponin was 2.8 which rose to 7.28.  Patient was transferred to icu.  Cardiology and nephrology consults pending.

## 2016-02-27 NOTE — H&P (Signed)
[name]      Name: John Schultz MRN: [id number] DOB: March 02, 1941    ADMISSION DATE:  03/17/2016    CHIEF COMPLAINT:   Acute resp distress  HISTORY OF PRESENT ILLNESS   75 yo white male admitted to ICU for acutee mental status changes, increased WOB, low blood pressure Patient with septic knee s/p arthrocentesis  Patient placed on 100% NRB mask, patient lethargic and looks critically ill Wife at bedside, updated and notified. Patient at high risk for resp failure and cardiac arrest  Patient placed on Vasopresors, given 3 L IVF NS, CVP 4 and LA 2.5  PAST MEDICAL HISTORY    :  Past Medical History  Diagnosis Date  . CHF (congestive heart failure) ([place])   . DM (diabetes mellitus) ([place])   . Pancreatic cyst   . Skin cancer   . DDD (degenerative disc disease), thoracic   . Renal failure   . Respiratory failure [facility])    Past Surgical History  Procedure Laterality Date  . Aortoiliac bypass    . Cholecystectomy    . Kyphoplasty     Allergies: Tape   FAMILY HISTORY   Family History  Problem Relation Age of Onset  . CAD Mother   . Hypertension Mother   . Hypertension Father   . Prostate cancer Father   . Colon cancer Brother       SOCIAL HISTORY    reports that he has never smoked. He has never used smokeless tobacco. He reports that he does not drink alcohol or use illicit drugs.  Review of Systems  Unable to perform ROS: critical illness      VITAL SIGNS    Temp:  [97.4 F (36.3 C)-98.8 F (37.1 C)] 98.7 F (37.1 C) (04/03 0330) Pulse Rate:  [75-120] 85 (04/03 0730) Resp:  [17-43] 28 (04/03 0730) BP: (80-142)/(42-86) 91/48 mmHg (04/03 0730) SpO2:  [87 %-100 %] 95 % (04/03 0742) Weight:  [178 lb 12.7 oz (81.1 kg)-180 lb (81.647 kg)] 178 lb 12.7 oz (81.1 kg) (04/03 0330) HEMODYNAMICS: CVP:  [6 mmHg] 6 mmHg VENTILATOR SETTINGS:   INTAKE / OUTPUT:  Intake/Output Summary (Last 24 hours) at 02/27/16  0823 Last data filed at 03/19/2016 2100  Gross per 24 hour  Intake      0 ml  Output      0 ml  Net      0 ml       PHYSICAL EXAM   Physical Exam  Constitutional: He appears distressed.  HENT:  Head: Normocephalic and atraumatic.  Eyes: Pupils are equal, round, and reactive to light. No scleral icterus.  Neck: Normal range of motion. Neck supple.  Cardiovascular: Normal rate and regular rhythm.   No murmur heard. Pulmonary/Chest: He is in respiratory distress. He has wheezes. He has rales.  resp distress  Abdominal: Soft. He exhibits no distension. There is no tenderness.  Musculoskeletal: He exhibits no edema.  Neurological: He displays normal reflexes. Coordination normal.  Lethargic but arousable  Skin: Skin is warm. No rash noted. He is diaphoretic.       LABS   LABS:  CBC  Recent Labs Lab 03/16/2016 1339 02/27/16 0055  WBC 16.8* 12.7*  HGB 11.8* 11.1*  HCT 35.5* 33.2*  PLT 162 174   Coag's  Recent Labs Lab 02/27/16 0339  APTT 34  INR 1.40   BMET  Recent Labs Lab 03/11/2016 1339 02/27/16 0055  NA 130* 132*  K 4.3 4.1  CL 90* 97*  CO2 25 20*  BUN 79* 76*  CREATININE 2.73* 2.50*  GLUCOSE 319* 374*   Electrolytes  Recent Labs Lab 03/19/2016 1339 02/27/16 0055  CALCIUM 8.6* 7.6*   Sepsis Markers  Recent Labs Lab 03/19/2016 1914 02/27/16 0339  LATICACIDVEN 2.5* 2.2*  PROCALCITON  --  22.50   ABG  Recent Labs Lab 03/22/2016 2146 02/27/16 0423  PHART 7.40 7.34*  PCO2ART 32 43  PO2ART 113* 93   Liver Enzymes  Recent Labs Lab 02/27/16 0055  AST 51*  ALT 21  ALKPHOS 58  BILITOT 1.4*  ALBUMIN 2.4*   Cardiac Enzymes  Recent Labs Lab 03/24/2016 1914 02/27/16 0055  TROPONINI 2.90* 7.28*   Glucose  Recent Labs Lab 02/29/2016 2110 03/09/2016 2111 02/27/16 0331 02/27/16 0709  GLUCAP 354* 363* 338* 295*     Recent Results (from the past 240 hour(s))  Blood culture (routine x 2)     Status: None (Preliminary result)    Collection Time: 03/23/2016 12:59 PM  Result Value Ref Range Status   Specimen Description BLOOD LEFT FOREARM  Final   Special Requests BOTTLES DRAWN AEROBIC AND ANAEROBIC  1CC  Final   Culture NO GROWTH <12 HOURS  Final   Report Status PENDING  Incomplete  Rapid Influenza A&B Antigens ([place] only)     Status: None   Collection Time: 02/28/2016  1:00 PM  Result Value Ref Range Status   Influenza A ([facility]) NEGATIVE NEGATIVE Final   Influenza B ([facility]) NEGATIVE NEGATIVE Final  Body fluid culture     Status: None (Preliminary result)   Collection Time: 03/10/2016  3:05 PM  Result Value Ref Range Status   Specimen Description R Knee  Final   Special Requests NONE  Final   Gram Stain   Final    MODERATE WBC SEEN MODERATE GRAM POSITIVE COCCI IN PAIRS AND CHAINS    Culture PENDING  Incomplete   Report Status PENDING  Incomplete  Body fluid culture     Status: None (Preliminary result)   Collection Time: 03/15/2016  4:10 PM  Result Value Ref Range Status   Specimen Description R Knee  Final   Special Requests NONE  Final   Gram Stain   Final    MANY WBC SEEN MANY GRAM POSITIVE COCCI IN PAIRS AND CHAINS    Culture PENDING  Incomplete   Report Status PENDING  Incomplete  MRSA PCR Screening     Status: None   Collection Time: 02/27/16  3:35 AM  Result Value Ref Range Status   MRSA by PCR NEGATIVE NEGATIVE Final    Comment:        The GeneXpert MRSA Assay (FDA approved for NASAL specimens only), is one component of a comprehensive MRSA colonization surveillance program. It is not intended to diagnose MRSA infection nor to guide or monitor treatment for MRSA infections.      Current facility-administered medications:  .  0.9 %  sodium chloride infusion, , Intravenous, Continuous, Mikael Spray, NP, Last Rate: 100 mL/hr at 02/27/16 0600 .  0.9 %  sodium chloride infusion, 750 mL, Intravenous, PRN, Mikael Spray, NP .  acetaminophen (TYLENOL) tablet 650 mg, 650 mg, Oral, Q6H PRN  **OR** acetaminophen (TYLENOL) suppository 650 mg, 650 mg, Rectal, Q6H PRN, John Crouch, MD .  amLODipine (NORVASC) tablet 5 mg, 5 mg, Oral, Daily, John Crouch, MD, 5 mg at 03/25/2016 2039 .  aspirin EC tablet 81 mg, 81 mg, Oral, Daily, John Schultz  John Sites, MD, 81 mg at 03/12/2016 2039 .  bisacodyl (DULCOLAX) suppository 10 mg, 10 mg, Rectal, Daily PRN, John Crouch, MD .  docusate sodium (COLACE) capsule 100 mg, 100 mg, Oral, BID, John Crouch, MD, 100 mg at 03/25/2016 2138 .  gabapentin (NEURONTIN) capsule 300 mg, 300 mg, Oral, BID, John Crouch, MD, 300 mg at 02/27/16 0809 .  gabapentin (NEURONTIN) capsule 600 mg, 600 mg, Oral, QHS, John Crouch, MD, 600 mg at 03/21/2016 2138 .  haloperidol lactate (HALDOL) injection 1 mg, 1 mg, Intravenous, Q6H PRN, Theodoro Grist, MD .  heparin ADULT infusion 100 units/mL (25000 units/250 mL), 1,000 Units/hr, Intravenous, Continuous, John Crouch, MD, Last Rate: 10 mL/hr at 02/27/16 0600, 1,000 Units/hr at 02/27/16 0600 .  insulin aspart (novoLOG) injection 0-9 Units, 0-9 Units, Subcutaneous, 4 times per day, Mikael Spray, NP, 5 Units at 02/27/16 0716 .  insulin glargine (LANTUS) injection 15 Units, 15 Units, Subcutaneous, QPM, John Crouch, MD, 15 Units at 02/29/2016 2138 .  ipratropium-albuterol (DUONEB) 0.5-2.5 (3) MG/3ML nebulizer solution 3 mL, 3 mL, Nebulization, QID, John Crouch, MD, 3 mL at 02/27/16 0742 .  irbesartan (AVAPRO) tablet 150 mg, 150 mg, Oral, Daily, John Crouch, MD, 150 mg at 02/27/2016 2039 .  isosorbide mononitrate (IMDUR) 24 hr tablet 30 mg, 30 mg, Oral, Daily, John Crouch, MD, 30 mg at 03/18/2016 2039 .  metoprolol tartrate (LOPRESSOR) tablet 25 mg, 25 mg, Oral, Q6H, Theodoro Grist, MD, Stopped at 02/27/16 0415 .  mometasone-formoterol (DULERA) 200-5 MCG/ACT inhaler 2 puff, 2 puff, Inhalation, BID, John Crouch, MD, 2 puff at 02/27/16 0810 .  morphine 2 MG/ML injection 2 mg, 2 mg, Intravenous, Q2H  PRN, John Crouch, MD, 2 mg at 02/27/16 0041 .  ondansetron (ZOFRAN) tablet 4 mg, 4 mg, Oral, Q6H PRN **OR** ondansetron (ZOFRAN) injection 4 mg, 4 mg, Intravenous, Q6H PRN, John Crouch, MD .  piperacillin-tazobactam (ZOSYN) IVPB 3.375 g, 3.375 g, Intravenous, 3 times per day, John Crouch, MD, Last Rate: 12.5 mL/hr at 02/27/16 0626, 3.375 g at 02/27/16 0626 .  pravastatin (PRAVACHOL) tablet 40 mg, 40 mg, Oral, q1800, John Crouch, MD .  sodium chloride 0.9 % bolus 1,000 mL, 1,000 mL, Intravenous, Once, Theodoro Grist, MD, 1,000 mL at 02/27/16 0639 .  sodium chloride flush (NS) 0.9 % injection 3 mL, 3 mL, Intravenous, Q12H, John Crouch, MD, 3 mL at 03/16/2016 2138 .  vancomycin (VANCOCIN) 1,250 mg in sodium chloride 0.9 % 250 mL IVPB, 1,250 mg, Intravenous, Q36H, John Crouch, MD, 1,250 mg at 02/27/16 0442  IMAGING    Dg Chest 2 View  02/28/2016  CLINICAL DATA:  Nausea, vomiting, diarrhea and cough. Also weakness, congestion, fever and chills. History of CHF, diabetes. EXAM: CHEST  2 VIEW COMPARISON:  Chest x-rays dated 04/24/2015 and 03/20/2015. FINDINGS: Study is again hypoinspiratory with crowding of the perihilar bronchovascular markings. Given the low lung volumes, lungs appear clear. Cardiomediastinal silhouette is stable in size and configuration. Median sternotomy wires are stable in alignment. Multiple old compression fracture deformities are seen within the upper lumbar spine, 1 of which is status post previous vertebroplasty. No evidence of acute osseous abnormality. Surgical clips again noted within the upper abdomen. IMPRESSION: Low lung volumes. No evidence of acute cardiopulmonary abnormality. No evidence of pneumonia. Electronically Signed   By: Franki Cabot M.D.   On: 03/13/2016 13:40   Dg Chest Port 1 View  02/27/2016  CLINICAL DATA:  Central line placement.  Initial encounter. EXAM: PORTABLE CHEST 1 VIEW COMPARISON:  Chest radiograph performed earlier today at  3:52 a.m. FINDINGS: A right IJ line is noted ending about the mid to distal SVC. The lungs are hypoexpanded. Bibasilar airspace opacities may reflect atelectasis or possibly pneumonia. Mild vascular crowding is noted. Small bilateral pleural effusions are suspected. No pneumothorax is seen. The cardiomediastinal silhouette is mildly enlarged. The patient is status post median sternotomy, with evidence of prior CABG. No acute osseous abnormalities are identified. Scattered clips are noted about the upper abdomen. IMPRESSION: 1. Right IJ line noted ending about the mid to distal SVC. 2. Lungs hypoexpanded. Bibasilar airspace opacities may reflect atelectasis or possibly pneumonia. 3. Suspect small bilateral pleural effusions.  Mild cardiomegaly. Electronically Signed   By: Garald Balding M.D.   On: 02/27/2016 05:57   Dg Chest Port 1 View  02/27/2016  CLINICAL DATA:  Acute onset of respiratory failure. Initial encounter. EXAM: PORTABLE CHEST 1 VIEW COMPARISON:  Chest radiograph performed 03/04/2016 FINDINGS: The lungs are hypoexpanded. Bibasilar airspace opacities may reflect atelectasis or pneumonia. No definite pleural effusion or pneumothorax is seen. The cardiomediastinal silhouette is borderline normal in size. The patient is status post median sternotomy, with evidence of prior CABG. Scattered clips are noted at the upper abdomen. No acute osseous abnormalities are identified. IMPRESSION: Lungs hypoexpanded. Bibasilar airspace opacities may reflect atelectasis or pneumonia. Electronically Signed   By: Garald Balding M.D.   On: 02/27/2016 04:04   Dg Knee Complete 4 Views Right  03/21/2016  CLINICAL DATA:  Acute knee pain.  Initial encounter. EXAM: RIGHT KNEE - COMPLETE 4+ VIEW COMPARISON:  None. FINDINGS: There is no evidence of acute fracture, subluxation or dislocation. A small knee effusion is present. No focal bony lesions are present. Heavy vascular calcifications are present. IMPRESSION: Small knee  effusion without acute bony abnormality. Heavy vascular calcifications. Electronically Signed   By: Margarette Canada M.D.   On: 03/19/2016 13:39      Indwelling Urinary Catheter continued, requirement due to   Reason to continue Indwelling Urinary Catheter for strict Intake/Output monitoring for hemodynamic instability   Central Line continued, requirement due to             INDWELLING DEVICES:: RT IJ CVL 4/3>>>  MICRO DATA: MRSA PCR negatibe Urine  Blood Resp  WOUND CX: MANY GRAM POSITIVE COCCI IN PAIRS AND CHAINS  ANTIMICROBIALS: Zosyn and Vancomycin    ASSESSMENT/PLAN  75 yo white male admitted to ICU for acute septic shock with resp distress from severe acidosis and septic arthritis Patient at high risk for cardiac arrest and death  PULMONARY 1.Respiratory Failure -oxygen as needed   CARDIOVASCULAR Septic shock -use vasopressors to keep MAP>65 -follow ABG and LA -follow up cultures -empiric ABX -consider stress dose steroids    RENAL Place foley Follow UP  GASTROINTESTINAL Keep NPO for now  HEMATOLOGIC Follow CBC  INFECTIOUS Infected RT knee -abx as prescribed  NEUROLOGIC encphalopathy from acidosis    I have personally obtained a history, examined the patient, evaluated laboratory and independently reviewed  imaging results, formulated the assessment and plan and placed orders.  The Patient requires high complexity decision making for assessment and support, frequent evaluation and titration of therapies, application of advanced monitoring technologies and extensive interpretation of multiple databases. Critical Care Time devoted to patient care services described in this note is 45 minutes.   Overall, patient is critically ill, prognosis is guarded. Patient at  high risk for cardiac arrest and death.    Corrin Parker, M.D.  [facility]  Medical Director [place] Director [facility]  Cardio-Pulmonary Department

## 2016-02-27 NOTE — Progress Notes (Signed)
Chaplain rounded the unit and provided a compassionate presence and spiritual support through silent prayer. to the patient. John Schultz [phone]

## 2016-02-27 NOTE — Progress Notes (Signed)
Subjective:  Patient known to our practice from outpatient. He is followed for CKD st 3 Admitted for septic rt knee. Reports knee pain today Also Dx with NSTEMI Currently hypotensive and being monitored in the ICU Heparin drip; fluid bolus     Objective:  Vital signs in last 24 hours:  Temp:  [97.4 F (36.3 C)-98.8 F (37.1 C)] 98.7 F (37.1 C) (04/03 0330) Pulse Rate:  [75-120] 85 (04/03 0730) Resp:  [17-43] 28 (04/03 0730) BP: (80-142)/(42-86) 91/48 mmHg (04/03 0730) SpO2:  [87 %-100 %] 95 % (04/03 0742) Weight:  [81.1 kg (178 lb 12.7 oz)-81.647 kg (180 lb)] 81.1 kg (178 lb 12.7 oz) (04/03 0330)  Weight change:  Filed Weights   03/07/2016 1223 02/27/16 0330  Weight: 81.647 kg (180 lb) 81.1 kg (178 lb 12.7 oz)    Intake/Output:    Intake/Output Summary (Last 24 hours) at 02/27/16 1110 Last data filed at 02/27/16 0959  Gross per 24 hour  Intake    319 ml  Output    175 ml  Net    144 ml     Physical Exam: General: Critically ill appearing, lethargic  HEENT Face mask oxygen  Neck supple  Pulm/lungs coarse b/l,   CVS/Heart Regular, soft systolic murmur  Abdomen:  Soft, non distended  Extremities: No peripheral edema, rt knee drain in place  Neurologic: Lethargic  Skin: Heel skin breakdown    Foley       Basic Metabolic Panel:   Recent Labs Lab 03/12/2016 1339 02/27/16 0055 02/27/16 0331  NA 130* 132* 134*  K 4.3 4.1 3.9  CL 90* 97* 100*  CO2 25 20* 22  GLUCOSE 319* 374* 351*  BUN 79* 76* 78*  CREATININE 2.73* 2.50* 2.59*  CALCIUM 8.6* 7.6* 7.7*  MG  --   --  1.8     CBC:  Recent Labs Lab 03/15/2016 1339 02/27/16 0055 02/27/16 0331  WBC 16.8* 12.7* 11.2*  HGB 11.8* 11.1* 10.9*  HCT 35.5* 33.2* 33.0*  MCV 91.6 93.0 92.2  PLT 162 174 158      Microbiology:  Recent Results (from the past 720 hour(s))  Blood culture (routine x 2)     Status: None (Preliminary result)   Collection Time: 03/19/2016 12:59 PM  Result Value Ref Range Status    Specimen Description BLOOD LEFT FOREARM  Final   Special Requests BOTTLES DRAWN AEROBIC AND ANAEROBIC  1CC  Final   Culture NO GROWTH <12 HOURS  Final   Report Status PENDING  Incomplete  Rapid Influenza A&B Antigens ([place] only)     Status: None   Collection Time: 02/27/2016  1:00 PM  Result Value Ref Range Status   Influenza A ([facility]) NEGATIVE NEGATIVE Final   Influenza B ([facility]) NEGATIVE NEGATIVE Final  Body fluid culture     Status: None (Preliminary result)   Collection Time: 03/23/2016  3:05 PM  Result Value Ref Range Status   Specimen Description R Knee  Final   Special Requests NONE  Final   Gram Stain   Final    MODERATE WBC SEEN MODERATE GRAM POSITIVE COCCI IN PAIRS AND CHAINS    Culture   Final    HEAVY GROWTH GROUP A STREP (S.PYOGENES) ISOLATED There is no known Penicillin Resistant Beta Streptococcus in the [place] For patients that are Penicillin-allergic, Erythromycin is 85-94% susceptible, and Clindamycin is 80% susceptible.  Contact Microbiology within 7 days if sensitivity testing is  required.      Report Status  PENDING  Incomplete  Body fluid culture     Status: None (Preliminary result)   Collection Time: 03/15/2016  4:10 PM  Result Value Ref Range Status   Specimen Description R Knee  Final   Special Requests NONE  Final   Gram Stain   Final    MANY WBC SEEN MANY GRAM POSITIVE COCCI IN PAIRS AND CHAINS    Culture   Final    HEAVY GROWTH GROUP A STREP (S.PYOGENES) ISOLATED There is no known Penicillin Resistant Beta Streptococcus in the [place] For patients that are Penicillin-allergic, Erythromycin is 85-94% susceptible, and Clindamycin is 80% susceptible.  Contact Microbiology within 7 days if sensitivity testing is  required.      Report Status PENDING  Incomplete  MRSA PCR Screening     Status: None   Collection Time: 02/27/16  3:35 AM  Result Value Ref Range Status   MRSA by PCR NEGATIVE NEGATIVE Final    Comment:        The GeneXpert MRSA Assay  (FDA approved for NASAL specimens only), is one component of a comprehensive MRSA colonization surveillance program. It is not intended to diagnose MRSA infection nor to guide or monitor treatment for MRSA infections.     Coagulation Studies:  Recent Labs  02/27/16 0339  LABPROT 17.3*  INR 1.40    Urinalysis:  Recent Labs  02/27/16 0830  COLORURINE YELLOW*  LABSPEC 1.018  PHURINE 5.0  GLUCOSEU 50*  HGBUR 2+*  BILIRUBINUR NEGATIVE  KETONESUR NEGATIVE  PROTEINUR 30*  NITRITE NEGATIVE  LEUKOCYTESUR NEGATIVE      Imaging: Dg Chest 2 View  02/29/2016  CLINICAL DATA:  Nausea, vomiting, diarrhea and cough. Also weakness, congestion, fever and chills. History of CHF, diabetes. EXAM: CHEST  2 VIEW COMPARISON:  Chest x-rays dated 04/24/2015 and 03/20/2015. FINDINGS: Study is again hypoinspiratory with crowding of the perihilar bronchovascular markings. Given the low lung volumes, lungs appear clear. Cardiomediastinal silhouette is stable in size and configuration. Median sternotomy wires are stable in alignment. Multiple old compression fracture deformities are seen within the upper lumbar spine, 1 of which is status post previous vertebroplasty. No evidence of acute osseous abnormality. Surgical clips again noted within the upper abdomen. IMPRESSION: Low lung volumes. No evidence of acute cardiopulmonary abnormality. No evidence of pneumonia. Electronically Signed   By: Franki Cabot M.D.   On: 03/05/2016 13:40   Dg Chest Port 1 View  02/27/2016  CLINICAL DATA:  Central line placement.  Initial encounter. EXAM: PORTABLE CHEST 1 VIEW COMPARISON:  Chest radiograph performed earlier today at 3:52 a.m. FINDINGS: A right IJ line is noted ending about the mid to distal SVC. The lungs are hypoexpanded. Bibasilar airspace opacities may reflect atelectasis or possibly pneumonia. Mild vascular crowding is noted. Small bilateral pleural effusions are suspected. No pneumothorax is seen. The  cardiomediastinal silhouette is mildly enlarged. The patient is status post median sternotomy, with evidence of prior CABG. No acute osseous abnormalities are identified. Scattered clips are noted about the upper abdomen. IMPRESSION: 1. Right IJ line noted ending about the mid to distal SVC. 2. Lungs hypoexpanded. Bibasilar airspace opacities may reflect atelectasis or possibly pneumonia. 3. Suspect small bilateral pleural effusions.  Mild cardiomegaly. Electronically Signed   By: Garald Balding M.D.   On: 02/27/2016 05:57   Dg Chest Port 1 View  02/27/2016  CLINICAL DATA:  Acute onset of respiratory failure. Initial encounter. EXAM: PORTABLE CHEST 1 VIEW COMPARISON:  Chest radiograph performed 03/19/2016 FINDINGS: The lungs  are hypoexpanded. Bibasilar airspace opacities may reflect atelectasis or pneumonia. No definite pleural effusion or pneumothorax is seen. The cardiomediastinal silhouette is borderline normal in size. The patient is status post median sternotomy, with evidence of prior CABG. Scattered clips are noted at the upper abdomen. No acute osseous abnormalities are identified. IMPRESSION: Lungs hypoexpanded. Bibasilar airspace opacities may reflect atelectasis or pneumonia. Electronically Signed   By: Garald Balding M.D.   On: 02/27/2016 04:04   Dg Knee Complete 4 Views Right  03/19/2016  CLINICAL DATA:  Acute knee pain.  Initial encounter. EXAM: RIGHT KNEE - COMPLETE 4+ VIEW COMPARISON:  None. FINDINGS: There is no evidence of acute fracture, subluxation or dislocation. A small knee effusion is present. No focal bony lesions are present. Heavy vascular calcifications are present. IMPRESSION: Small knee effusion without acute bony abnormality. Heavy vascular calcifications. Electronically Signed   By: Margarette Canada M.D.   On: 03/09/2016 13:39     Medications:   . sodium chloride 750 mL/hr at 02/27/16 0830  . heparin 1,000 Units/hr (02/27/16 0700)  . norepinephrine     . antiseptic oral rinse   7 mL Mouth Rinse BID  . aspirin EC  81 mg Oral Daily  . docusate sodium  100 mg Oral BID  . gabapentin  300 mg Oral QHS  . insulin aspart  0-5 Units Subcutaneous QHS  . insulin aspart  0-9 Units Subcutaneous TID WC  . insulin glargine  15 Units Subcutaneous QPM  . ipratropium-albuterol  3 mL Nebulization QID  . mometasone-formoterol  2 puff Inhalation BID  . pantoprazole (PROTONIX) IV  40 mg Intravenous Q24H  . piperacillin-tazobactam (ZOSYN)  IV  3.375 g Intravenous 3 times per day  . pravastatin  40 mg Oral q1800  . sodium chloride flush  3 mL Intravenous Q12H  . vancomycin  1,250 mg Intravenous Q36H   sodium chloride, acetaminophen **OR** acetaminophen, bisacodyl, haloperidol lactate, morphine injection, ondansetron **OR** ondansetron (ZOFRAN) IV  Assessment/ Plan:  75 y.o. Caucasian male  with significant medical history of coronary disease with four-vessel CABG in 1998, gallbladder rupture, extensive surgery, Diabetes with complications of retinopathy, peripheral neuropathy, peripheral vascular disease, Aorto iliac bypass, angioplasty and stent in his left leg, kyphoplasty for chronic back pain, CKD st 3 with Baseline Cr 1.5/ GFR 46  1. 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 Rt Knee septic arthritis 3. NSTEMI 4. DM-2 with CKD 5. Hypotension  Plan: Difficult situation. Patient is known vasculopath. With new NSTEMI - may need cardiac cath which will likely worsen his renal function. Concurrent septic knee is not helping.  Continue volume optimization.  Consider low dose iv fluids to avoid cardiac strain and pulm edema Hold irbesartan and amlodipine due to low BP     LOS: 1 Beyounce Dickens 4/3/201711:10 AM

## 2016-02-27 NOTE — Procedures (Signed)
Endotracheal Intubation: Patient required placement of an artificial airway secondary to resp failure.   Consent: Emergent.   Hand washing performed prior to starting the procedure.   Medications administered for sedation prior to procedure: Midazolam 2 mg IV,  Vecuronium 10 mg IV, Fentanyl 50 mcg IV.   Procedure: A time out procedure was called and correct patient, name, & ID confirmed. Needed supplies and equipment were assembled and checked to include ETT, 10 ml syringe, Glidescope, Mac and Miller blades, suction, oxygen and bag mask valve, end tidal CO2 monitor. Patient was positioned to align the mouth and pharynx to facilitate visualization of the glottis.  Heart rate, SpO2 and blood pressure was continuously monitored during the procedure. Pre-oxygenation was conducted prior to intubation and endotracheal tube was placed through the vocal cords into the trachea.  During intubation an assistant applied gentle pressure to the cricoid cartilage.   The artificial airway was placed under direct visualization via glidescope route using a 8.0 ETT on the first attempt.    ETT was secured at 23 cm mark.    Placement was confirmed by auscuitation of lungs with good breath sounds bilaterally and no stomach sounds.  Condensation was noted on endotracheal tube.  Pulse ox 100%.  CO2 detector in place with appropriate color change.   Complications: None .   Operator: Lorrena Goranson.   Chest radiograph ordered and pending.   Comments: OGT placed via glidescope.  Corrin Parker, M.D.  [facility]  Medical Director [place] Director [facility] Cardio-Pulmonary Department

## 2016-02-27 NOTE — Progress Notes (Signed)
Erica and NP at bedside at 0300 Patient placed on venti mask. Patient transported to ICU. Report given to [facility]

## 2016-02-27 NOTE — Progress Notes (Signed)
Assessing labs.Patient Troponin 7.28. Spoke with lab tech Lina Sar states that they do not call critical labs if one was already called in 24 hours and no 500% increase value. Explained that lab was still critical. Prime Doc notified at 0238. Dr. Melynda Ripple notified of critical lab and progressive change in patients baseline. Dr. Melynda Ripple placing new orders. Rapid response nurse Danae Chen notified states she will come assess patient.

## 2016-02-27 NOTE — Progress Notes (Signed)
Nursing Supervisor Ann at bedside

## 2016-02-27 NOTE — Progress Notes (Signed)
Patient skin clammy and pale. Patient denies pain. Patient alert to self and slow to answer questions. Patient rapidly declining. Prime doc paged

## 2016-02-27 NOTE — Consult Note (Signed)
Patient was seen in emergency room last night and had me aspiration and irrigation of 6 L through the knee for septic joint. He is now on CCU with multiple medical problems.  His knee seems to be less painful OP does report persistent pain. There is drainage present in the Hemovac which is slightly discolored consistent with persisting infection  Impression a septic right knee plan remove drains are oh if recurrent effusion may need repeat aspiration and possible formal arthroscopy in the OR if infection persists

## 2016-02-27 NOTE — Progress Notes (Signed)
eLink Physician-Brief Progress Note Patient Name: John Schultz DOB: December 08, 1940 MRN: [id number]   Date of Service  02/27/2016  HPI/Events of Note  Camera in, all records reviewed and d/w CCM APP Recent septic knee, now with hypoxia, hypotension and lethargy since 6 pm progressive Trop was elevated over 7 No recent ECG  Diff: MI, worsening sepsis syndrome, hcap? Asp?    eICU Interventions  ABG, pcxr, ecg, lactic, pct Continued hep, asa Get echo May need ett     Intervention Category Major Interventions: Hypotension - evaluation and management Evaluation Type: New Patient Evaluation  FEINSTEIN,DANIEL J. 02/27/2016, 3:47 AM

## 2016-02-27 NOTE — Progress Notes (Signed)
PT Cancellation Note  Patient Details Name: John Schultz MRN: [id number] DOB: 1941/05/15   Cancelled Treatment:    Reason Eval/Treat Not Completed: Patient not medically ready.  Pt currently has up-trending troponin (7.28), will hold PT eval at this time.  Will re-attempt PT eval at a later date and time when pt is medically appropriate.    Mittie Bodo, SPT Mittie Bodo 02/27/2016, 8:48 AM

## 2016-02-27 NOTE — Progress Notes (Signed)
ANTICOAGULATION CONSULT NOTE - Initial Consult  Pharmacy Consult for heparin Indication: chest pain/ACS  Allergies  Allergen Reactions  . Tape Rash    Patient Measurements: Height: 5\' 7"  (170.2 cm) Weight: 180 lb (81.647 kg) IBW/kg (Calculated) : 66.1 Heparin Dosing Weight: 81.6 kg  Vital Signs: Temp: 97.8 F (36.6 C) (04/03 0257) Temp Source: Oral (04/03 0257) BP: 88/47 mmHg (04/03 0257) Pulse Rate: 77 (04/03 0257)  Labs:  Recent Labs  03/25/2016 1339 03/23/2016 1914 02/27/16 0055  HGB 11.8*  --  11.1*  HCT 35.5*  --  33.2*  PLT 162  --  174  CREATININE 2.73*  --  2.50*  TROPONINI  --  2.90* 7.28*    Estimated Creatinine Clearance: 26.9 mL/min (by C-G formula based on Cr of 2.5).   Medical History: Past Medical History  Diagnosis Date  . CHF (congestive heart failure) ([place])   . DM (diabetes mellitus) ([place])   . Pancreatic cyst   . Skin cancer   . DDD (degenerative disc disease), thoracic   . Renal failure   . Respiratory failure ([facility])     Medications:  Infusions:  . sodium chloride 100 mL/hr at 02/25/2016 1854  . heparin      Assessment: 35 yom with rising troponin, pharmacy consulted to dose heparin for ACS.  Goal of Therapy:  Heparin level 0.3-0.7 units/ml Monitor platelets by anticoagulation protocol: Yes   Plan:  Start heparin infusion at 1000 units/hr Check anti-Xa level in 8 hours and daily while on heparin Continue to monitor H&H and platelets   Patient received subcutaneous heparin 5000 units x 1 this evening so no bolus.  Laural Benes, Pharm.D., BCPS Clinical Pharmacist 02/27/2016,3:08 AM

## 2016-02-27 NOTE — Consult Note (Signed)
[facility] Infectious Disease     Reason for Consult: Septic knee    Referring Physician: Chester Holstein Date of Admission:  03/19/2016   Principal Problem:   Septic arthritis of knee, right ([place]) Active Problems:   ARF (acute renal failure) (HCC)   Acute bronchitis   Hyponatremia   NSTEMI (non-ST elevated myocardial infarction) (HCC)   Pressure ulcer   Septic shock (HCC)   Acute respiratory failure (HCC)   HPI: John Schultz is a 75 y.o. male with a PMH of CAD s/p CABG, respiratory failure, CHF, type 2 DM, and CKD admitted 03/01/2016 with sepsis due to right septic knee.  He apparently had 4 days of R knee pain followed by fevers, sob, chills and sweats.  He had aspiration of 20 cc purulent fluid followed by 6L lavage in ED. Has since decompensated and is currently intubated and on pressors.   Past Medical History  Diagnosis Date  . CHF (congestive heart failure) ([place])   . DM (diabetes mellitus) ([place])   . Pancreatic cyst   . Skin cancer   . DDD (degenerative disc disease), thoracic   . Renal failure   . Respiratory failure [facility])    Past Surgical History  Procedure Laterality Date  . Aortoiliac bypass    . Cholecystectomy    . Kyphoplasty     Social History  Substance Use Topics  . Smoking status: Never Smoker   . Smokeless tobacco: Never Used  . Alcohol Use: No   Family History  Problem Relation Age of Onset  . CAD Mother   . Hypertension Mother   . Hypertension Father   . Prostate cancer Father   . Colon cancer Brother     Allergies:  Allergies  Allergen Reactions  . Tape Rash    Current antibiotics: Antibiotics Given (last 72 hours)    Date/Time Action Medication Dose Rate   02/27/16 0442 Given   vancomycin (VANCOCIN) 1,250 mg in sodium chloride 0.9 % 250 mL IVPB 1,250 mg 166.7 mL/hr      MEDICATIONS: . antiseptic oral rinse  7 mL Mouth Rinse BID  . antiseptic oral rinse  7 mL Mouth Rinse q12n4p  . aspirin EC  81 mg Oral Daily  . [START ON  02/28/2016]  ceFAZolin (ANCEF) IV  1 g Intravenous Q12H  . chlorhexidine  15 mL Mouth Rinse BID  . docusate sodium  100 mg Oral BID  . fentaNYL      . fentaNYL (SUBLIMAZE) injection  50 mcg Intravenous Once  . gabapentin  300 mg Oral QHS  . insulin aspart  0-5 Units Subcutaneous QHS  . insulin aspart  0-9 Units Subcutaneous TID WC  . insulin glargine  15 Units Subcutaneous QPM  . ipratropium-albuterol  3 mL Nebulization QID  . midazolam      . midazolam  2 mg Intravenous Once  . mometasone-formoterol  2 puff Inhalation BID  . pantoprazole (PROTONIX) IV  40 mg Intravenous Q24H  . pravastatin  40 mg Oral q1800  . sodium chloride flush  3 mL Intravenous Q12H  . sterile water (preservative free)      . vecuronium      . vecuronium  10 mg Intravenous Once    Review of Systems - unable to obtain  OBJECTIVE: Temp:  [97.4 F (36.3 C)-98.8 F (37.1 C)] 98.8 F (37.1 C) (04/03 1200) Pulse Rate:  [75-120] 88 (04/03 1400) Resp:  [18-43] 21 (04/03 1400) BP: (73-142)/(42-86) 95/57 mmHg (04/03 1400) SpO2:  [  93 %-100 %] 93 % (04/03 1400) FiO2 (%):  [40 %] 40 % (04/03 1200) Weight:  [81.1 kg (178 lb 12.7 oz)] 81.1 kg (178 lb 12.7 oz) (04/03 0330) Physical Exam  Constitutional: intubated, critically ill appearing.  HENT: perrla, eomi Mouth/Throat: Oropharynx - etet in place  Cardiovascular: tachy, reg Pulmonary/Chest: mech bs Abdominal: Soft. Bowel sounds are normal. He exhibits no distension. There is no tenderness.  Lymphadenopathy: He has no cervical adenopathy.  Neurological: intubated, sedated  Skin: Skin is warm and dry. No rash noted. No erythema.  Psychiatric: sedated Ext - L knee wrapped post op  LABS: Results for orders placed or performed during the hospital encounter of 03/23/2016 (from the past 48 hour(s))  Blood culture (routine x 2)     Status: None (Preliminary result)   Collection Time: 03/13/2016 12:59 PM  Result Value Ref Range   Specimen Description BLOOD LEFT FOREARM     Special Requests BOTTLES DRAWN AEROBIC AND ANAEROBIC  1CC    Culture NO GROWTH <12 HOURS    Report Status PENDING   Rapid Influenza A&B Antigens ([place] only)     Status: None   Collection Time: 03/19/2016  1:00 PM  Result Value Ref Range   Influenza A ([facility]) NEGATIVE NEGATIVE   Influenza B ([facility]) NEGATIVE NEGATIVE  CBC     Status: Abnormal   Collection Time: 03/24/2016  1:39 PM  Result Value Ref Range   WBC 16.8 (H) 3.8 - 10.6 K/uL   RBC 3.87 (L) 4.40 - 5.90 MIL/uL   Hemoglobin 11.8 (L) 13.0 - 18.0 g/dL   HCT 35.5 (L) 40.0 - 52.0 %   MCV 91.6 80.0 - 100.0 fL   MCH 30.5 26.0 - 34.0 pg   MCHC 33.3 32.0 - 36.0 g/dL   RDW 13.9 11.5 - 14.5 %   Platelets 162 150 - 440 K/uL  Basic metabolic panel     Status: Abnormal   Collection Time: 03/02/2016  1:39 PM  Result Value Ref Range   Sodium 130 (L) 135 - 145 mmol/L   Potassium 4.3 3.5 - 5.1 mmol/L   Chloride 90 (L) 101 - 111 mmol/L   CO2 25 22 - 32 mmol/L   Glucose, Bld 319 (H) 65 - 99 mg/dL   BUN 79 (H) 6 - 20 mg/dL   Creatinine, Ser 2.73 (H) 0.61 - 1.24 mg/dL   Calcium 8.6 (L) 8.9 - 10.3 mg/dL   GFR calc non Af Amer 22 (L) >60 mL/min   GFR calc Af Amer 25 (L) >60 mL/min    Comment: (NOTE) The eGFR has been calculated using the CKD EPI equation. This calculation has not been validated in all clinical situations. eGFR's persistently <60 mL/min signify possible Chronic Kidney Disease.    Anion gap 15 5 - 15  Sedimentation rate     Status: Abnormal   Collection Time: 03/01/2016  1:39 PM  Result Value Ref Range   Sed Rate 82 (H) 0 - 20 mm/hr  C-reactive protein     Status: Abnormal   Collection Time: 03/21/2016  1:39 PM  Result Value Ref Range   CRP 29.0 (H) <1.0 mg/dL    Comment: Performed at [facility]  Body fluid culture     Status: None (Preliminary result)   Collection Time: 02/25/2016  3:05 PM  Result Value Ref Range   Specimen Description R Knee    Special Requests NONE    Gram Stain      MODERATE WBC SEEN  MODERATE  GRAM POSITIVE COCCI IN PAIRS AND CHAINS    Culture      HEAVY GROWTH GROUP A STREP (S.PYOGENES) ISOLATED There is no known Penicillin Resistant Beta Streptococcus in the [place] For patients that are Penicillin-allergic, Erythromycin is 85-94% susceptible, and Clindamycin is 80% susceptible.  Contact Microbiology within 7 days if sensitivity testing is  required.      Report Status PENDING   Synovial cell count + diff, w/ crystals     Status: Abnormal   Collection Time: 03/15/2016  4:10 PM  Result Value Ref Range   Color, Synovial YELLOW YELLOW   Appearance-Synovial TURBID (A) CLEAR   Crystals, Fluid NONE SEEN    WBC, Synovial 346786 (H) 0 - 200 /cu mm   Neutrophil, Synovial 95 %   Lymphocytes-Synovial Fld 3 %   Monocyte-Macrophage-Synovial Fluid 2 %   Eosinophils-Synovial 0 %   Other Cells-SYN 0   Body fluid culture     Status: None (Preliminary result)   Collection Time: 03/08/2016  4:10 PM  Result Value Ref Range   Specimen Description R Knee    Special Requests NONE    Gram Stain      MANY WBC SEEN MANY GRAM POSITIVE COCCI IN PAIRS AND CHAINS    Culture      HEAVY GROWTH GROUP A STREP (S.PYOGENES) ISOLATED There is no known Penicillin Resistant Beta Streptococcus in the [place] For patients that are Penicillin-allergic, Erythromycin is 85-94% susceptible, and Clindamycin is 80% susceptible.  Contact Microbiology within 7 days if sensitivity testing is  required.      Report Status PENDING   Influenza panel by PCR (type A & B, H1N1)     Status: None   Collection Time: 03/03/2016  6:42 PM  Result Value Ref Range   Influenza A By PCR NEGATIVE NEGATIVE   Influenza B By PCR NEGATIVE NEGATIVE   H1N1 flu by pcr NOT DETECTED NOT DETECTED    Comment:        The Xpert Flu assay (FDA approved for nasal aspirates or washes and nasopharyngeal swab specimens), is intended as an aid in the diagnosis of influenza and should not be used as a sole basis for treatment.   Lactic acid, plasma      Status: Abnormal   Collection Time: 03/19/2016  7:14 PM  Result Value Ref Range   Lactic Acid, Venous 2.5 (HH) 0.5 - 2.0 mmol/L    Comment: CRITICAL RESULT CALLED TO, READ BACK BY AND VERIFIED WITH JASMIN DORSETT AT 2002 02/25/2016.PMH  Troponin I     Status: Abnormal   Collection Time: 03/02/2016  7:14 PM  Result Value Ref Range   Troponin I 2.90 (H) <0.031 ng/mL    Comment: READ BACK AND VERIFIED WITH JASMIN DORSETT AT 2002 02/27/2016.PMH        POSSIBLE MYOCARDIAL ISCHEMIA. SERIAL TESTING RECOMMENDED.   Glucose, capillary     Status: Abnormal   Collection Time: 03/06/2016  9:10 PM  Result Value Ref Range   Glucose-Capillary 354 (H) 65 - 99 mg/dL  Glucose, capillary     Status: Abnormal   Collection Time: 03/11/2016  9:11 PM  Result Value Ref Range   Glucose-Capillary 363 (H) 65 - 99 mg/dL  Blood gas, arterial     Status: Abnormal   Collection Time: 03/04/2016  9:46 PM  Result Value Ref Range   FIO2 0.36    pH, Arterial 7.40 7.350 - 7.450   pCO2 arterial 32 32.0 - 48.0 mmHg  pO2, Arterial 113 (H) 83.0 - 108.0 mmHg   Bicarbonate 19.8 (L) 21.0 - 28.0 mEq/L   Acid-base deficit 4.3 (H) 0.0 - 2.0 mmol/L   O2 Saturation 98.4 %   Patient temperature 37.0    Collection site RIGHT RADIAL    Sample type ARTERIAL DRAW    Allens test (pass/fail) PASS PASS  Comprehensive metabolic panel     Status: Abnormal   Collection Time: 02/27/16 12:55 AM  Result Value Ref Range   Sodium 132 (L) 135 - 145 mmol/L   Potassium 4.1 3.5 - 5.1 mmol/L   Chloride 97 (L) 101 - 111 mmol/L   CO2 20 (L) 22 - 32 mmol/L   Glucose, Bld 374 (H) 65 - 99 mg/dL   BUN 76 (H) 6 - 20 mg/dL   Creatinine, Ser 2.50 (H) 0.61 - 1.24 mg/dL   Calcium 7.6 (L) 8.9 - 10.3 mg/dL   Total Protein 6.1 (L) 6.5 - 8.1 g/dL   Albumin 2.4 (L) 3.5 - 5.0 g/dL   AST 51 (H) 15 - 41 U/L   ALT 21 17 - 63 U/L   Alkaline Phosphatase 58 38 - 126 U/L   Total Bilirubin 1.4 (H) 0.3 - 1.2 mg/dL   GFR calc non Af Amer 24 (L) >60 mL/min   GFR calc Af Amer  28 (L) >60 mL/min    Comment: (NOTE) The eGFR has been calculated using the CKD EPI equation. This calculation has not been validated in all clinical situations. eGFR's persistently <60 mL/min signify possible Chronic Kidney Disease.    Anion gap 15 5 - 15  CBC     Status: Abnormal   Collection Time: 02/27/16 12:55 AM  Result Value Ref Range   WBC 12.7 (H) 3.8 - 10.6 K/uL   RBC 3.57 (L) 4.40 - 5.90 MIL/uL   Hemoglobin 11.1 (L) 13.0 - 18.0 g/dL   HCT 33.2 (L) 40.0 - 52.0 %   MCV 93.0 80.0 - 100.0 fL   MCH 31.2 26.0 - 34.0 pg   MCHC 33.6 32.0 - 36.0 g/dL   RDW 13.7 11.5 - 14.5 %   Platelets 174 150 - 440 K/uL  Troponin I     Status: Abnormal   Collection Time: 02/27/16 12:55 AM  Result Value Ref Range   Troponin I 7.28 (H) <0.031 ng/mL    Comment: PREVIOUS RESULT CALLED AT 2002 03/13/2016.PMH        POSSIBLE MYOCARDIAL ISCHEMIA. SERIAL TESTING RECOMMENDED.   CBC     Status: Abnormal   Collection Time: 02/27/16  3:31 AM  Result Value Ref Range   WBC 11.2 (H) 3.8 - 10.6 K/uL   RBC 3.58 (L) 4.40 - 5.90 MIL/uL   Hemoglobin 10.9 (L) 13.0 - 18.0 g/dL   HCT 33.0 (L) 40.0 - 52.0 %   MCV 92.2 80.0 - 100.0 fL   MCH 30.4 26.0 - 34.0 pg   MCHC 33.0 32.0 - 36.0 g/dL   RDW 13.9 11.5 - 14.5 %   Platelets 158 150 - 440 K/uL  Basic metabolic panel     Status: Abnormal   Collection Time: 02/27/16  3:31 AM  Result Value Ref Range   Sodium 134 (L) 135 - 145 mmol/L   Potassium 3.9 3.5 - 5.1 mmol/L   Chloride 100 (L) 101 - 111 mmol/L   CO2 22 22 - 32 mmol/L   Glucose, Bld 351 (H) 65 - 99 mg/dL   BUN 78 (H) 6 - 20 mg/dL   Creatinine,  Ser 2.59 (H) 0.61 - 1.24 mg/dL   Calcium 7.7 (L) 8.9 - 10.3 mg/dL   GFR calc non Af Amer 23 (L) >60 mL/min   GFR calc Af Amer 27 (L) >60 mL/min    Comment: (NOTE) The eGFR has been calculated using the CKD EPI equation. This calculation has not been validated in all clinical situations. eGFR's persistently <60 mL/min signify possible Chronic  Kidney Disease.    Anion gap 12 5 - 15  Magnesium     Status: None   Collection Time: 02/27/16  3:31 AM  Result Value Ref Range   Magnesium 1.8 1.7 - 2.4 mg/dL  Glucose, capillary     Status: Abnormal   Collection Time: 02/27/16  3:31 AM  Result Value Ref Range   Glucose-Capillary 338 (H) 65 - 99 mg/dL  Lipid panel     Status: Abnormal   Collection Time: 02/27/16  3:31 AM  Result Value Ref Range   Cholesterol 76 0 - 200 mg/dL   Triglycerides 95 <150 mg/dL   HDL 35 (L) >40 mg/dL   Total CHOL/HDL Ratio 2.2 RATIO   VLDL 19 0 - 40 mg/dL   LDL Cholesterol 22 0 - 99 mg/dL    Comment:        Total Cholesterol/HDL:CHD Risk Coronary Heart Disease Risk Table                     Men   Women  1/2 Average Risk   3.4   3.3  Average Risk       5.0   4.4  2 X Average Risk   9.6   7.1  3 X Average Risk  23.4   11.0        Use the calculated Patient Ratio above and the CHD Risk Table to determine the patient's CHD Risk.        ATP III CLASSIFICATION (LDL):  <100     mg/dL   Optimal  100-129  mg/dL   Near or Above                    Optimal  130-159  mg/dL   Borderline  160-189  mg/dL   High  >190     mg/dL   Very High   MRSA PCR Screening     Status: None   Collection Time: 02/27/16  3:35 AM  Result Value Ref Range   MRSA by PCR NEGATIVE NEGATIVE    Comment:        The GeneXpert MRSA Assay (FDA approved for NASAL specimens only), is one component of a comprehensive MRSA colonization surveillance program. It is not intended to diagnose MRSA infection nor to guide or monitor treatment for MRSA infections.   APTT     Status: None   Collection Time: 02/27/16  3:39 AM  Result Value Ref Range   aPTT 34 24 - 36 seconds  Protime-INR     Status: Abnormal   Collection Time: 02/27/16  3:39 AM  Result Value Ref Range   Prothrombin Time 17.3 (H) 11.4 - 15.0 seconds   INR 1.40   Lactic acid, plasma     Status: Abnormal   Collection Time: 02/27/16  3:39 AM  Result Value Ref Range    Lactic Acid, Venous 2.2 (HH) 0.5 - 2.0 mmol/L    Comment: CRITICAL RESULT CALLED TO, READ BACK BY AND VERIFIED WITH LESLIE LEWIS AT 0422 02/27/16.PMH  Procalcitonin - Baseline  Status: None   Collection Time: 02/27/16  3:39 AM  Result Value Ref Range   Procalcitonin 22.50 ng/mL    Comment:        Interpretation: PCT >= 10 ng/mL: Important systemic inflammatory response, almost exclusively due to severe bacterial sepsis or septic shock. (NOTE)         ICU PCT Algorithm               Non ICU PCT Algorithm    ----------------------------     ------------------------------         PCT < 0.25 ng/mL                 PCT < 0.1 ng/mL     Stopping of antibiotics            Stopping of antibiotics       strongly encouraged.               strongly encouraged.    ----------------------------     ------------------------------       PCT level decrease by               PCT < 0.25 ng/mL       >= 80% from peak PCT       OR PCT 0.25 - 0.5 ng/mL          Stopping of antibiotics                                             encouraged.     Stopping of antibiotics           encouraged.    ----------------------------     ------------------------------       PCT level decrease by              PCT >= 0.25 ng/mL       < 80% from peak PCT        AND PCT >= 0.5 ng/mL             Continuing antibiotics                                              encouraged.       Continuing antibiotics            encouraged.    ----------------------------     ------------------------------     PCT level increase compared          PCT > 0.5 ng/mL         with peak PCT AND          PCT >= 0.5 ng/mL             Escalation of antibiotics                                          strongly encouraged.      Escalation of antibiotics        strongly encouraged.   Blood gas, arterial     Status: Abnormal   Collection Time: 02/27/16  4:23 AM  Result Value Ref Range   FIO2 0.40  Delivery systems VENTIMASK    pH,  Arterial 7.34 (L) 7.350 - 7.450   pCO2 arterial 43 32.0 - 48.0 mmHg   pO2, Arterial 93 83.0 - 108.0 mmHg   Bicarbonate 23.2 21.0 - 28.0 mEq/L   Acid-base deficit 2.5 (H) 0.0 - 2.0 mmol/L   O2 Saturation 96.7 %   Patient temperature 37.0    Collection site RIGHT RADIAL    Sample type ARTERIAL DRAW    Allens test (pass/fail) PASS PASS  Glucose, capillary     Status: Abnormal   Collection Time: 02/27/16  7:09 AM  Result Value Ref Range   Glucose-Capillary 295 (H) 65 - 99 mg/dL  Urinalysis complete, with microscopic ([facility] only)     Status: Abnormal   Collection Time: 02/27/16  8:30 AM  Result Value Ref Range   Color, Urine YELLOW (A) YELLOW   APPearance TURBID (A) CLEAR   Glucose, UA 50 (A) NEGATIVE mg/dL   Bilirubin Urine NEGATIVE NEGATIVE   Ketones, ur NEGATIVE NEGATIVE mg/dL   Specific Gravity, Urine 1.018 1.005 - 1.030   Hgb urine dipstick 2+ (A) NEGATIVE   pH 5.0 5.0 - 8.0   Protein, ur 30 (A) NEGATIVE mg/dL   Nitrite NEGATIVE NEGATIVE   Leukocytes, UA NEGATIVE NEGATIVE   RBC / HPF 6-30 0 - 5 RBC/hpf   WBC, UA 0-5 0 - 5 WBC/hpf   Bacteria, UA RARE (A) NONE SEEN   Squamous Epithelial / LPF 0-5 (A) NONE SEEN   Mucous PRESENT   Troponin I     Status: Abnormal   Collection Time: 02/27/16  8:32 AM  Result Value Ref Range   Troponin I 8.10 (H) <0.031 ng/mL    Comment: PREVIOUS RESULT CALLED BY PMH AT 2002 03/08/2016 DAS        POSSIBLE MYOCARDIAL ISCHEMIA. SERIAL TESTING RECOMMENDED.   Lactic acid, plasma     Status: None   Collection Time: 02/27/16 12:00 PM  Result Value Ref Range   Lactic Acid, Venous 1.3 0.5 - 2.0 mmol/L  Glucose, capillary     Status: Abnormal   Collection Time: 02/27/16 12:01 PM  Result Value Ref Range   Glucose-Capillary 206 (H) 65 - 99 mg/dL  Heparin level (unfractionated)     Status: Abnormal   Collection Time: 02/27/16  1:22 PM  Result Value Ref Range   Heparin Unfractionated 0.13 (L) 0.30 - 0.70 IU/mL    Comment:        IF HEPARIN RESULTS ARE  BELOW EXPECTED VALUES, AND PATIENT DOSAGE HAS BEEN CONFIRMED, SUGGEST FOLLOW UP TESTING OF ANTITHROMBIN III LEVELS.    No components found for: ESR, C REACTIVE PROTEIN MICRO: Recent Results (from the past 720 hour(s))  Blood culture (routine x 2)     Status: None (Preliminary result)   Collection Time: 02/25/2016 12:59 PM  Result Value Ref Range Status   Specimen Description BLOOD LEFT FOREARM  Final   Special Requests BOTTLES DRAWN AEROBIC AND ANAEROBIC  1CC  Final   Culture NO GROWTH <12 HOURS  Final   Report Status PENDING  Incomplete  Rapid Influenza A&B Antigens ([place] only)     Status: None   Collection Time: 03/19/2016  1:00 PM  Result Value Ref Range Status   Influenza A ([place]) NEGATIVE NEGATIVE Final   Influenza B ([facility]) NEGATIVE NEGATIVE Final  Body fluid culture     Status: None (Preliminary result)   Collection Time: 03/11/2016  3:05 PM  Result Value Ref Range Status   Specimen  Description R Knee  Final   Special Requests NONE  Final   Gram Stain   Final    MODERATE WBC SEEN MODERATE GRAM POSITIVE COCCI IN PAIRS AND CHAINS    Culture   Final    HEAVY GROWTH GROUP A STREP (S.PYOGENES) ISOLATED There is no known Penicillin Resistant Beta Streptococcus in the [place] For patients that are Penicillin-allergic, Erythromycin is 85-94% susceptible, and Clindamycin is 80% susceptible.  Contact Microbiology within 7 days if sensitivity testing is  required.      Report Status PENDING  Incomplete  Body fluid culture     Status: None (Preliminary result)   Collection Time: 03/09/2016  4:10 PM  Result Value Ref Range Status   Specimen Description R Knee  Final   Special Requests NONE  Final   Gram Stain   Final    MANY WBC SEEN MANY GRAM POSITIVE COCCI IN PAIRS AND CHAINS    Culture   Final    HEAVY GROWTH GROUP A STREP (S.PYOGENES) ISOLATED There is no known Penicillin Resistant Beta Streptococcus in the [place] For patients that are Penicillin-allergic, Erythromycin is 85-94%  susceptible, and Clindamycin is 80% susceptible.  Contact Microbiology within 7 days if sensitivity testing is  required.      Report Status PENDING  Incomplete  MRSA PCR Screening     Status: None   Collection Time: 02/27/16  3:35 AM  Result Value Ref Range Status   MRSA by PCR NEGATIVE NEGATIVE Final    Comment:        The GeneXpert MRSA Assay (FDA approved for NASAL specimens only), is one component of a comprehensive MRSA colonization surveillance program. It is not intended to diagnose MRSA infection nor to guide or monitor treatment for MRSA infections.     IMAGING: Dg Chest 2 View  03/06/2016  CLINICAL DATA:  Nausea, vomiting, diarrhea and cough. Also weakness, congestion, fever and chills. History of CHF, diabetes. EXAM: CHEST  2 VIEW COMPARISON:  Chest x-rays dated 04/24/2015 and 03/20/2015. FINDINGS: Study is again hypoinspiratory with crowding of the perihilar bronchovascular markings. Given the low lung volumes, lungs appear clear. Cardiomediastinal silhouette is stable in size and configuration. Median sternotomy wires are stable in alignment. Multiple old compression fracture deformities are seen within the upper lumbar spine, 1 of which is status post previous vertebroplasty. No evidence of acute osseous abnormality. Surgical clips again noted within the upper abdomen. IMPRESSION: Low lung volumes. No evidence of acute cardiopulmonary abnormality. No evidence of pneumonia. Electronically Signed   By: Franki Cabot M.D.   On: 03/15/2016 13:40   Dg Chest Port 1 View  02/27/2016  CLINICAL DATA:  Endotracheal tube placement, respiratory failure. EXAM: PORTABLE CHEST 1 VIEW COMPARISON:  Same day. FINDINGS: Stable cardiomediastinal silhouette. Status post coronary artery bypass graft. Endotracheal tube is seen in grossly good position projected over tracheal air shadow with distal tip 3 cm above the carina. Atherosclerosis of thoracic aorta is noted. Hypoinflation of the lungs is  noted. Mild bibasilar subsegmental atelectasis is noted with associated left pleural effusion. No pneumothorax is noted. Right internal jugular catheter is unchanged in position. IMPRESSION: Endotracheal tube in grossly good position. Hypoinflation of the lungs is noted. Stable bibasilar subsegmental atelectasis with associated mild left pleural effusion. Electronically Signed   By: Marijo Conception, M.D.   On: 02/27/2016 16:23   Dg Chest Port 1 View  02/27/2016  CLINICAL DATA:  Central line placement.  Initial encounter. EXAM: PORTABLE CHEST 1 VIEW  COMPARISON:  Chest radiograph performed earlier today at 3:52 a.m. FINDINGS: A right IJ line is noted ending about the mid to distal SVC. The lungs are hypoexpanded. Bibasilar airspace opacities may reflect atelectasis or possibly pneumonia. Mild vascular crowding is noted. Small bilateral pleural effusions are suspected. No pneumothorax is seen. The cardiomediastinal silhouette is mildly enlarged. The patient is status post median sternotomy, with evidence of prior CABG. No acute osseous abnormalities are identified. Scattered clips are noted about the upper abdomen. IMPRESSION: 1. Right IJ line noted ending about the mid to distal SVC. 2. Lungs hypoexpanded. Bibasilar airspace opacities may reflect atelectasis or possibly pneumonia. 3. Suspect small bilateral pleural effusions.  Mild cardiomegaly. Electronically Signed   By: Garald Balding M.D.   On: 02/27/2016 05:57   Dg Chest Port 1 View  02/27/2016  CLINICAL DATA:  Acute onset of respiratory failure. Initial encounter. EXAM: PORTABLE CHEST 1 VIEW COMPARISON:  Chest radiograph performed 03/08/2016 FINDINGS: The lungs are hypoexpanded. Bibasilar airspace opacities may reflect atelectasis or pneumonia. No definite pleural effusion or pneumothorax is seen. The cardiomediastinal silhouette is borderline normal in size. The patient is status post median sternotomy, with evidence of prior CABG. Scattered clips are noted  at the upper abdomen. No acute osseous abnormalities are identified. IMPRESSION: Lungs hypoexpanded. Bibasilar airspace opacities may reflect atelectasis or pneumonia. Electronically Signed   By: Garald Balding M.D.   On: 02/27/2016 04:04   Dg Knee Complete 4 Views Right  02/28/2016  CLINICAL DATA:  Acute knee pain.  Initial encounter. EXAM: RIGHT KNEE - COMPLETE 4+ VIEW COMPARISON:  None. FINDINGS: There is no evidence of acute fracture, subluxation or dislocation. A small knee effusion is present. No focal bony lesions are present. Heavy vascular calcifications are present. IMPRESSION: Small knee effusion without acute bony abnormality. Heavy vascular calcifications. Electronically Signed   By: Margarette Canada M.D.   On: 03/19/2016 13:39    Assessment:   RICHEY DOOLITTLE is a 75 y.o. male with Group A strep septic knee (Synovial WBC > 300K), sepsis, hypotensive, intubated, no obvious sourse with no evidence cellulitis and no PNA on cxr. No reports of preceding pharyngitis.   Recommendations Change to pcn and clinda Check echo COntinue supportive care Thank you very much for allowing me to participate in the care of this patient. Please call with questions.   Cheral Marker. Ola Spurr, MD

## 2016-02-27 NOTE — Procedures (Signed)
Central Venous Catheter Insertion Procedure Note OFIR GRIESINGER [id number] Jul 14, 1941  Procedure: Insertion of Central Venous Catheter Indications: Assessment of intravascular volume, Drug and/or fluid administration and Frequent blood sampling  Procedure Details Consent: Risks of procedure as well as the alternatives and risks of each were explained to the (patient/caregiver).  Consent for procedure obtained. Time Out: Verified patient identification, verified procedure, site/side was marked, verified correct patient position, special equipment/implants available, medications/allergies/relevent history reviewed, required imaging and test results available.  Performed  Maximum sterile technique was used including antiseptics, cap, gloves, gown, hand hygiene, mask and sheet. Skin prep: Chlorhexidine; local anesthetic administered A antimicrobial bonded/coated triple lumen catheter was placed in the right internal jugular vein using the Seldinger technique.  Evaluation Blood flow good Complications: No apparent complications Patient did tolerate procedure well. Chest X-ray ordered to verify placement.  CXR: normal.  Procedure performed under direct supervision of Dr. Mortimer Fries. Ultrasound utilized for realtime vessel cannulation  [facility]dal[facility] S. [facility] ANP-BC Pulmonary and Critical Care Medicine [facility] Pager [phone]   02/27/2016, 5:36 AM  STAFF NOTE: I, Dr. Corrin Parker,  Was immediately available after procedure I reviewed patient's available data, including medical history  Emile Ringgenberg Patricia Pesa, M.D.  [facility] Pulmonary & Critical Care Medicine  Medical Director [place] Director [facility] Cardio-Pulmonary Department

## 2016-02-27 NOTE — Progress Notes (Signed)
Assessing EKG rhythm change. Spoke with monitor tech Isabell Jarvis that states that strip was placed in chart but no phone call was made to nursing staff. Monitor tech states that any change in rhythms nursing staff have to be notified. Dr. Melynda Ripple notified of patients change to [facility]. No new orders given

## 2016-02-27 NOTE — Progress Notes (Signed)
ANTICOAGULATION CONSULT NOTE - Initial Consult  Pharmacy Consult for heparin Indication: chest pain/ACS  Allergies  Allergen Reactions  . Tape Rash    Patient Measurements: Height: 6' (182.9 cm) Weight: 178 lb 12.7 oz (81.1 kg) IBW/kg (Calculated) : 77.6 Heparin Dosing Weight: 81.6 kg  Vital Signs: Temp: 98.8 F (37.1 C) (04/03 1200) Temp Source: Axillary (04/03 1200) BP: 95/57 mmHg (04/03 1400) Pulse Rate: 88 (04/03 1400)  Labs:  Recent Labs  03/19/2016 1339 03/16/2016 1914 02/27/16 0055 02/27/16 0331 02/27/16 0339 02/27/16 0832 02/27/16 1322  HGB 11.8*  --  11.1* 10.9*  --   --   --   HCT 35.5*  --  33.2* 33.0*  --   --   --   PLT 162  --  174 158  --   --   --   APTT  --   --   --   --  34  --   --   LABPROT  --   --   --   --  17.3*  --   --   INR  --   --   --   --  1.40  --   --   HEPARINUNFRC  --   --   --   --   --   --  0.13*  CREATININE 2.73*  --  2.50* 2.59*  --   --   --   TROPONINI  --  2.90* 7.28*  --   --  8.10*  --     Estimated Creatinine Clearance: 27.9 mL/min (by C-G formula based on Cr of 2.59).   Medical History: Past Medical History  Diagnosis Date  . CHF (congestive heart failure) ([place])   . DM (diabetes mellitus) ([place])   . Pancreatic cyst   . Skin cancer   . DDD (degenerative disc disease), thoracic   . Renal failure   . Respiratory failure (HCC)     Medications:  Infusions:  . sodium chloride Stopped (02/27/16 1145)  . heparin 1,000 Units/hr (02/27/16 0700)  . norepinephrine 5 mcg/min (02/27/16 1245)    Assessment: 53 yom with rising troponin, pharmacy consulted to dose heparin for ACS.  Goal of Therapy:  Heparin level 0.3-0.7 units/ml Monitor platelets by anticoagulation protocol: Yes   Plan:  Heparin level is below goal so will bolus heparin 2400 units and increase heparin infusion to 1250 units/hr. Will recheck a HL in 8 hours.   Ulice Dash D, Pharm.D., BCPS Clinical Pharmacist 02/27/2016,2:47 PM

## 2016-02-27 NOTE — Progress Notes (Signed)
[place] [facility] [place] NAME: John Schultz    MR#:  [id number]  DATE OF BIRTH:  [birth year]  SUBJECTIVE:  CHIEF COMPLAINT:   Chief Complaint  Patient presents with  . Influenza  . Knee Pain     Came with pain in knee, found to have septic arthritis, and have sepsis- hypotension.   Also have elevated troponin, denies any chest pain.  REVIEW OF SYSTEMS:  CONSTITUTIONAL: No fever, fatigue or weakness.  EYES: No blurred or double vision.  EARS, NOSE, AND THROAT: No tinnitus or ear pain.  RESPIRATORY: No cough, shortness of breath, wheezing or hemoptysis.  CARDIOVASCULAR: No chest pain, orthopnea, edema.  GASTROINTESTINAL: No nausea, vomiting, diarrhea or abdominal pain.  GENITOURINARY: No dysuria, hematuria.  ENDOCRINE: No polyuria, nocturia,  HEMATOLOGY: No anemia, easy bruising or bleeding SKIN: No rash or lesion. MUSCULOSKELETAL: right knee joint pain or arthritis.   NEUROLOGIC: No tingling, numbness, weakness.  PSYCHIATRY: No anxiety or depression.   ROS  DRUG ALLERGIES:   Allergies  Allergen Reactions  . Tape Rash    VITALS:  Blood pressure 91/48, pulse 85, temperature 98.7 F (37.1 C), temperature source Axillary, resp. rate 28, height 6' (1.829 m), weight 81.1 kg (178 lb 12.7 oz), SpO2 95 %.  PHYSICAL EXAMINATION:  GENERAL:  75 y.o.-year-old patient lying in the bed with no acute distress. Pt appears tired, and want to sleep. EYES: Pupils equal, round, reactive to light and accommodation. No scleral icterus. Extraocular muscles intact.  HEENT: Head atraumatic, normocephalic. Oropharynx and nasopharynx clear.  NECK:  Supple, no jugular venous distention. No thyroid enlargement, no tenderness.  LUNGS: Normal breath sounds bilaterally, no wheezing, rales,rhonchi or crepitation. No use of accessory muscles of respiration.  CARDIOVASCULAR: S1, S2 normal. No murmurs, rubs, or gallops.  ABDOMEN: Soft, nontender,  nondistended. Bowel sounds present. No organomegaly or mass.  EXTREMITIES: No pedal edema, cyanosis, or clubbing. Right knee dressing and drainage tubes present. NEUROLOGIC: Cranial nerves II through XII are intact. Muscle strength 5/5 in all extremities. Except right leg- not moving much due to pain in knee. Sensation intact. Gait not checked.  PSYCHIATRIC: The patient is alert and oriented x 3.  SKIN: No obvious rash, lesion, or ulcer.   Physical Exam LABORATORY PANEL:   CBC  Recent Labs Lab 02/27/16 0331  WBC 11.2*  HGB 10.9*  HCT 33.0*  PLT 158   ------------------------------------------------------------------------------------------------------------------  Chemistries   Recent Labs Lab 02/27/16 0055 02/27/16 0331  NA 132* 134*  K 4.1 3.9  CL 97* 100*  CO2 20* 22  GLUCOSE 374* 351*  BUN 76* 78*  CREATININE 2.50* 2.59*  CALCIUM 7.6* 7.7*  MG  --  1.8  AST 51*  --   ALT 21  --   ALKPHOS 58  --   BILITOT 1.4*  --    ------------------------------------------------------------------------------------------------------------------  Cardiac Enzymes  Recent Labs Lab 02/27/16 0055 02/27/16 0832  TROPONINI 7.28* 8.10*   ------------------------------------------------------------------------------------------------------------------  RADIOLOGY:  Dg Chest 2 View  02/25/2016  CLINICAL DATA:  Nausea, vomiting, diarrhea and cough. Also weakness, congestion, fever and chills. History of CHF, diabetes. EXAM: CHEST  2 VIEW COMPARISON:  Chest x-rays dated 04/24/2015 and 03/20/2015. FINDINGS: Study is again hypoinspiratory with crowding of the perihilar bronchovascular markings. Given the low lung volumes, lungs appear clear. Cardiomediastinal silhouette is stable in size and configuration. Median sternotomy wires are stable in alignment. Multiple old compression fracture deformities are seen within the upper lumbar spine,  1 of which is status post previous vertebroplasty.  No evidence of acute osseous abnormality. Surgical clips again noted within the upper abdomen. IMPRESSION: Low lung volumes. No evidence of acute cardiopulmonary abnormality. No evidence of pneumonia. Electronically Signed   By: Franki Cabot M.D.   On: 02/27/2016 13:40   Dg Chest Port 1 View  02/27/2016  CLINICAL DATA:  Central line placement.  Initial encounter. EXAM: PORTABLE CHEST 1 VIEW COMPARISON:  Chest radiograph performed earlier today at 3:52 a.m. FINDINGS: A right IJ line is noted ending about the mid to distal SVC. The lungs are hypoexpanded. Bibasilar airspace opacities may reflect atelectasis or possibly pneumonia. Mild vascular crowding is noted. Small bilateral pleural effusions are suspected. No pneumothorax is seen. The cardiomediastinal silhouette is mildly enlarged. The patient is status post median sternotomy, with evidence of prior CABG. No acute osseous abnormalities are identified. Scattered clips are noted about the upper abdomen. IMPRESSION: 1. Right IJ line noted ending about the mid to distal SVC. 2. Lungs hypoexpanded. Bibasilar airspace opacities may reflect atelectasis or possibly pneumonia. 3. Suspect small bilateral pleural effusions.  Mild cardiomegaly. Electronically Signed   By: Garald Balding M.D.   On: 02/27/2016 05:57   Dg Chest Port 1 View  02/27/2016  CLINICAL DATA:  Acute onset of respiratory failure. Initial encounter. EXAM: PORTABLE CHEST 1 VIEW COMPARISON:  Chest radiograph performed 03/08/2016 FINDINGS: The lungs are hypoexpanded. Bibasilar airspace opacities may reflect atelectasis or pneumonia. No definite pleural effusion or pneumothorax is seen. The cardiomediastinal silhouette is borderline normal in size. The patient is status post median sternotomy, with evidence of prior CABG. Scattered clips are noted at the upper abdomen. No acute osseous abnormalities are identified. IMPRESSION: Lungs hypoexpanded. Bibasilar airspace opacities may reflect atelectasis or  pneumonia. Electronically Signed   By: Garald Balding M.D.   On: 02/27/2016 04:04   Dg Knee Complete 4 Views Right  03/03/2016  CLINICAL DATA:  Acute knee pain.  Initial encounter. EXAM: RIGHT KNEE - COMPLETE 4+ VIEW COMPARISON:  None. FINDINGS: There is no evidence of acute fracture, subluxation or dislocation. A small knee effusion is present. No focal bony lesions are present. Heavy vascular calcifications are present. IMPRESSION: Small knee effusion without acute bony abnormality. Heavy vascular calcifications. Electronically Signed   By: Margarette Canada M.D.   On: 03/06/2016 13:39    ASSESSMENT AND PLAN:   Principal Problem:   Septic arthritis of knee, right (HCC) Active Problems:   ARF (acute renal failure) (HCC)   Acute bronchitis   Hyponatremia   NSTEMI (non-ST elevated myocardial infarction) (HCC)   Pressure ulcer   Septic shock (HCC)  * septic shock   Due to septic arthritis   Also have lactic acidosis.    IV vanc + zosyn, Drainage tube placed per ortho.   Cx from fluid grow- gr positive cocci in pairs and chain.    Bl cx not reported yet.    Due to hypotension- started on levophed.    Pt is alert and oriented.  * NSTEMI    Elevated trop.    Hx of CABG    On heparin drip.    Cardio consult called in.    On asa, pravstatin.    Hold metoprolol due to hypotension.  * Hx of hypertension    Hold metoprolol, amlodipin due to hypotension  * Ac on ch renal failure   Avoid nephrotoxics   Consult nephrology.  * hypoantremia    Due to hyperglycemia and sepsis,  monitor.   All the records are reviewed and case discussed with Care Management/Social Workerr. Management plans discussed with the patient, family and they are in agreement.  CODE STATUS: Full.  TOTAL TIME TAKING CARE OF THIS PATIENT: 40 critical care minutes.   Discussed with his wife in room.  POSSIBLE D/C IN 3-4 DAYS, DEPENDING ON CLINICAL CONDITION.   Vaughan Basta M.D on 02/27/2016   Between  7am to 6pm - Pager - [phone]  After 6pm go to [email or web address] - password [email or web address] [place] [facility]  Office  [phone]  CC: Primary care physician; Dion Body, MD  Note: This dictation was prepared with Dragon dictation along with smaller phrase technology. Any transcriptional errors that result from this process are unintentional.

## 2016-02-27 NOTE — Progress Notes (Signed)
eLink Physician-Brief Progress Note Patient Name: John Schultz DOB: 1940-12-07 MRN: [id number]   Date of Service  02/27/2016  HPI/Events of Note    eICU Interventions  SSI     Intervention Category Intermediate Interventions: Hyperglycemia - evaluation and treatment  Enma Maeda V. 02/27/2016, 7:43 PM

## 2016-02-27 NOTE — Consult Note (Signed)
PULMONARY / CRITICAL CARE MEDICINE   Name: John Schultz MRN: [id number] DOB: 1941/01/02    ADMISSION DATE:  03/16/2016   CONSULTATION DATE:  02/27/2016  REFERRING MD:  Dr Melynda Ripple  CHIEF COMPLAINT:  Acute change in mental status and  hypotension   HISTORY OF PRESENT ILLNESS:   This is a 75 yo WM with a PMH of CAD s/p CABG, respiratory failure, CHF, type 2 DM, and CKD admitted 03/19/2016 with sepsis due to right septic knee. Purulent drainage from right knee with drain placement. Patient was given 3L of fluids and started on empiric antibiotics.  This morning, patient became obtunded with systolic blood pressure in the low 80s. Upon admission, patient was alert and oriented x 4. Now he only awakens to noxious stimulus. His troponin increased from 2.9 to 7.28.   PAST MEDICAL HISTORY :  He  has a past medical history of CHF (congestive heart failure) ([place]); DM (diabetes mellitus) ([place]); Pancreatic cyst; Skin cancer; DDD (degenerative disc disease), thoracic; Renal failure; and Respiratory failure ([place]).  PAST SURGICAL HISTORY: He  has past surgical history that includes Aortoiliac bypass; Cholecystectomy; and Kyphoplasty.  Allergies  Allergen Reactions  . Tape Rash    No current facility-administered medications on file prior to encounter.   No current outpatient prescriptions on file prior to encounter.    FAMILY HISTORY:  His indicated that his mother is deceased. He indicated that his father is deceased. He indicated that his brother is deceased.   SOCIAL HISTORY: He  reports that he has never smoked. He has never used smokeless tobacco. He reports that he does not drink alcohol or use illicit drugs.  REVIEW OF SYSTEMS:   Unable to obtain due to patient's mental status  SUBJECTIVE:   VITAL SIGNS: BP 88/47 mmHg  Pulse 77  Temp(Src) 97.8 F (36.6 C) (Oral)  Resp 34  Ht 5\' 7"  (1.702 m)  Wt 180 lb (81.647 kg)  BMI 28.19 kg/m2  SpO2 98%  HEMODYNAMICS:     VENTILATOR SETTINGS:    INTAKE / OUTPUT:    PHYSICAL EXAMINATION: General: Chronically ill looking, well developed Neuro: responsive to noxious stimulus HEENT: perrla, oral mucosa pink and dry, no JVD, trachea midline Cardiovascular: RRR, S1/S2, no MRG Lungs: Mildly tachypneic, bilateral airflow, diminished in the bases bilaterally Abdomen:  Non-distended, normal bowel sounds, no pain with palpation and no organomegaly Musculoskeletal:  Right knee swollen, dressing and jackson-pratt drain in place with serous purulent drainage Extremities: +2 pulses bilaterally, no edema Skin: warm and dry, no rash  LABS:  BMET  Recent Labs Lab 03/13/2016 1339 02/27/16 0055  NA 130* 132*  K 4.3 4.1  CL 90* 97*  CO2 25 20*  BUN 79* 76*  CREATININE 2.73* 2.50*  GLUCOSE 319* 374*    Electrolytes  Recent Labs Lab 03/17/2016 1339 02/27/16 0055  CALCIUM 8.6* 7.6*    CBC  Recent Labs Lab 03/24/2016 1339 02/27/16 0055  WBC 16.8* 12.7*  HGB 11.8* 11.1*  HCT 35.5* 33.2*  PLT 162 174    Coag's No results for input(s): APTT, INR in the last 168 hours.  Sepsis Markers  Recent Labs Lab 03/24/2016 1914  LATICACIDVEN 2.5*    ABG  Recent Labs Lab 03/21/2016 2146  PHART 7.40  PCO2ART 32  PO2ART 113*    Liver Enzymes  Recent Labs Lab 02/27/16 0055  AST 51*  ALT 21  ALKPHOS 58  BILITOT 1.4*  ALBUMIN 2.4*    Cardiac Enzymes  Recent Labs  Lab 03/09/2016 1914 02/27/16 0055  TROPONINI 2.90* 7.28*    Glucose  Recent Labs Lab 03/16/2016 2110 03/19/2016 2111  GLUCAP 354* 363*    Imaging Dg Chest 2 View  03/02/2016  CLINICAL DATA:  Nausea, vomiting, diarrhea and cough. Also weakness, congestion, fever and chills. History of CHF, diabetes. EXAM: CHEST  2 VIEW COMPARISON:  Chest x-rays dated 04/24/2015 and 03/20/2015. FINDINGS: Study is again hypoinspiratory with crowding of the perihilar bronchovascular markings. Given the low lung volumes, lungs appear clear.  Cardiomediastinal silhouette is stable in size and configuration. Median sternotomy wires are stable in alignment. Multiple old compression fracture deformities are seen within the upper lumbar spine, 1 of which is status post previous vertebroplasty. No evidence of acute osseous abnormality. Surgical clips again noted within the upper abdomen. IMPRESSION: Low lung volumes. No evidence of acute cardiopulmonary abnormality. No evidence of pneumonia. Electronically Signed   By: Franki Cabot M.D.   On: 03/25/2016 13:40   Dg Knee Complete 4 Views Right  03/25/2016  CLINICAL DATA:  Acute knee pain.  Initial encounter. EXAM: RIGHT KNEE - COMPLETE 4+ VIEW COMPARISON:  None. FINDINGS: There is no evidence of acute fracture, subluxation or dislocation. A small knee effusion is present. No focal bony lesions are present. Heavy vascular calcifications are present. IMPRESSION: Small knee effusion without acute bony abnormality. Heavy vascular calcifications. Electronically Signed   By: Margarette Canada M.D.   On: 02/29/2016 13:39     STUDIES:  2-D echo pending  CULTURES: 03/09/2016 Blood> Knee effusion>Gram stain: Moderate gram positive cocci in pairs and chains  ANTIBIOTICS: 03/05/2016 Vancomycin>> ZOSYN>>  SIGNIFICANT EVENTS: 04/02>ED with right knee pain, swelling and fever, admitted with septic right knee  04/03>ICU for septic versus cardiogenic shock, acute metabolic encephalopathy and septic right knee  LINES/TUBES: 04/03>Right IJ PIVs  DISCUSSION: 75 yo male with septic versus cardiogenic shock, acute metabolic encephalopathy 2/2 sepsis and shock, acute respiratory distress 2/2 sepsis and NSTEMI.    ASSESSMENT / PLAN:  PULMONARY A: Acute respiratory distress-Patient is still maintaining airway on VM; ABG unremarkable Bilateral infiltrates versus atelectasis Small bilateral pleural effusions P:   -Continue supplemental O2 VM and titrate to  as tolerated -Keep SPO2 >90% -High risk for  intubation due to worsening LOC -CXR-reviewed -Already on broad spectrum antibiotics  CARDIOVASCULAR A:  Septic versus hypovolemic shock Elevated troponin-demand ischemia versus NSTEMI; Patient is at increased risk for ACS  H/O CAD S/P CABG H/O CHF P:  -CVPs Q1 hr with fluids bolus to a CVP goal of 10-12 -Pressors if shock is refractory to fluid resuscitation -Cycle cardiac enzymes -Heparin gtte with PTT monitoring -Cardiology consult -ASA 162MG  X1 and 81 mg daily -STAT EKG-Reviewed -Hold BB in light of shock -Hemodynamic monitoring per ICU protocol -2-D echo  RENAL A:   Acute on chronic kidney injury P:   -IV fluids -Avoid nephrotoxic meds -Monitor and replace electrolytes  GASTROINTESTINAL A:   No acute issues P:   -PPI while NPO --Keep NPO with sips/meds until mental status improves  HEMATOLOGIC A:   Anemia P:  -Monitor CBC -VTE prophylaxis-full strength heparin already for ACS  INFECTIOUS A:   Sepsis 2/2 right septic knee P:   -F/U cultures -Abx as above -Ortho following  ENDOCRINE A:   Type 2 DM   P:   -Blood glucose monitoring with SSI coverage  NEUROLOGIC A:   Acute metabolic encephalopathy 2/2 sepsis and shock P:   RASS goal:  -ABX, and Fluids -Monitor mental status -  Aspiration precautions   Disposition and family update:Patient's wife updated on the phone and at bedside. Code status reviewed. Patient is a full code. Further changes in treatment plan pending clinical course and diagnostics.     Best Practice: Code Status:  Full. Diet: NPO GI prophylaxis:  PPI. VTE prophylaxis:  SCD's / on full strength heparin.   Total PCCM 75 minutes  Kynzie Polgar S. [facility] ANP-BC Pulmonary and [place] Pager [phone]   02/27/2016, 3:24 AM

## 2016-02-28 ENCOUNTER — Inpatient Hospital Stay
Admit: 2016-02-28 | Discharge: 2016-02-28 | Disposition: A | Discharge status: Home / Self Care | Payer: Commercial Managed Care - HMO | Attending: Adult Health | Admitting: Adult Health

## 2016-02-28 LAB — BLOOD CULTURE ID PANEL (REFLEXED)
ACINETOBACTER BAUMANNII: NOT DETECTED
CANDIDA ALBICANS: NOT DETECTED
CANDIDA GLABRATA: NOT DETECTED
Candida krusei: NOT DETECTED
Candida parapsilosis: NOT DETECTED
Candida tropicalis: NOT DETECTED
Carbapenem resistance: NOT DETECTED
ENTEROBACTER CLOACAE COMPLEX: NOT DETECTED
ENTEROBACTERIACEAE SPECIES: NOT DETECTED
ESCHERICHIA COLI: NOT DETECTED
Enterococcus species: NOT DETECTED
HAEMOPHILUS INFLUENZAE: NOT DETECTED
Klebsiella oxytoca: NOT DETECTED
Klebsiella pneumoniae: NOT DETECTED
LISTERIA MONOCYTOGENES: NOT DETECTED
METHICILLIN RESISTANCE: NOT DETECTED
NEISSERIA MENINGITIDIS: NOT DETECTED
PSEUDOMONAS AERUGINOSA: NOT DETECTED
Proteus species: NOT DETECTED
STREPTOCOCCUS PYOGENES: DETECTED — AB
STREPTOCOCCUS SPECIES: NOT DETECTED
Serratia marcescens: NOT DETECTED
Staphylococcus aureus (BCID): NOT DETECTED
Staphylococcus species: NOT DETECTED
Streptococcus agalactiae: NOT DETECTED
Streptococcus pneumoniae: NOT DETECTED
Vancomycin resistance: NOT DETECTED

## 2016-02-28 LAB — BASIC METABOLIC PANEL
Anion gap: 9 (ref 5–15)
BUN: 73 mg/dL — AB (ref 6–20)
CHLORIDE: 107 mmol/L (ref 101–111)
CO2: 19 mmol/L — AB (ref 22–32)
Calcium: 6.4 mg/dL — CL (ref 8.9–10.3)
Creatinine, Ser: 2.19 mg/dL — ABNORMAL HIGH (ref 0.61–1.24)
GFR calc Af Amer: 33 mL/min — ABNORMAL LOW (ref >60)
GFR, EST NON AFRICAN AMERICAN: 28 mL/min — AB (ref >60)
GLUCOSE: 221 mg/dL — AB (ref 65–99)
POTASSIUM: 3.1 mmol/L — AB (ref 3.5–5.1)
Sodium: 135 mmol/L (ref 135–145)

## 2016-02-28 LAB — CBC
HEMATOCRIT: 29.1 % — AB (ref 40.0–52.0)
Hemoglobin: 9.7 g/dL — ABNORMAL LOW (ref 13.0–18.0)
MCH: 31.1 pg (ref 26.0–34.0)
MCHC: 33.3 g/dL (ref 32.0–36.0)
MCV: 93.4 fL (ref 80.0–100.0)
PLATELETS: 184 10*3/uL (ref 150–440)
RBC: 3.12 MIL/uL — AB (ref 4.40–5.90)
RDW: 13.8 % (ref 11.5–14.5)
WBC: 15.2 10*3/uL — ABNORMAL HIGH (ref 3.8–10.6)

## 2016-02-28 LAB — HEPARIN LEVEL (UNFRACTIONATED)
Heparin Unfractionated: 0.2 IU/mL — ABNORMAL LOW (ref 0.30–0.70)
Heparin Unfractionated: 0.29 IU/mL — ABNORMAL LOW (ref 0.30–0.70)
Heparin Unfractionated: 0.31 IU/mL (ref 0.30–0.70)

## 2016-02-28 LAB — URINE CULTURE: Culture: NO GROWTH

## 2016-02-28 LAB — ECHOCARDIOGRAM COMPLETE
Height: 72 in
WEIGHTICAEL: 2987.67 [oz_av]

## 2016-02-28 LAB — GLUCOSE, CAPILLARY
GLUCOSE-CAPILLARY: 157 mg/dL — AB (ref 65–99)
GLUCOSE-CAPILLARY: 184 mg/dL — AB (ref 65–99)
GLUCOSE-CAPILLARY: 173 mg/dL — AB (ref 65–99)
GLUCOSE-CAPILLARY: 157 mg/dL — AB (ref 65–99)
GLUCOSE-CAPILLARY: 146 mg/dL — AB (ref 65–99)
GLUCOSE-CAPILLARY: 139 mg/dL — AB (ref 65–99)
GLUCOSE-CAPILLARY: 166 mg/dL — AB (ref 65–99)
GLUCOSE-CAPILLARY: 160 mg/dL — AB (ref 65–99)
Glucose-Capillary: 117 mg/dL — ABNORMAL HIGH (ref 65–99)
Glucose-Capillary: 145 mg/dL — ABNORMAL HIGH (ref 65–99)
Glucose-Capillary: 156 mg/dL — ABNORMAL HIGH (ref 65–99)
Glucose-Capillary: 168 mg/dL — ABNORMAL HIGH (ref 65–99)
Glucose-Capillary: 182 mg/dL — ABNORMAL HIGH (ref 65–99)
Glucose-Capillary: 196 mg/dL — ABNORMAL HIGH (ref 65–99)
Glucose-Capillary: 236 mg/dL — ABNORMAL HIGH (ref 65–99)
Glucose-Capillary: 304 mg/dL — ABNORMAL HIGH (ref 65–99)

## 2016-02-28 LAB — TRIGLYCERIDES: TRIGLYCERIDES: 82 mg/dL (ref <150)

## 2016-02-28 LAB — HEMOGLOBIN A1C: Hgb A1c MFr Bld: 7.5 % — ABNORMAL HIGH (ref 4.0–6.0)

## 2016-02-28 MED ORDER — VITAL HIGH PROTEIN PO LIQD: 1000.0000 mL | ORAL | Status: DC

## 2016-02-28 MED ORDER — DOCUSATE SODIUM 50 MG/5ML PO LIQD
100.0000 mg | Freq: Two times a day (BID) | ORAL | Status: DC
  Administered 2016-02-28 – 2016-03-01 (×5): 100 mg
  Filled 2016-02-28 (×5): qty 10

## 2016-02-28 MED ORDER — SODIUM CHLORIDE 0.9 % IV SOLN
INTRAVENOUS | Status: DC
  Administered 2016-02-28: 1 [IU]/h via INTRAVENOUS
  Administered 2016-02-29: 1.8 [IU]/h via INTRAVENOUS
  Administered 2016-02-29: 1.4 [IU]/h via INTRAVENOUS
  Administered 2016-02-29: 01:00:00 via INTRAVENOUS
  Administered 2016-03-02: 11.2 [IU]/h via INTRAVENOUS
  Administered 2016-03-02: 10.4 [IU]/h via INTRAVENOUS
  Administered 2016-03-02: 3.3 [IU]/h via INTRAVENOUS
  Administered 2016-03-02: 7 [IU]/h via INTRAVENOUS
  Administered 2016-03-04: 7.4 [IU]/h via INTRAVENOUS
  Administered 2016-03-05: 04:00:00 via INTRAVENOUS
  Filled 2016-02-28 (×7): qty 2.5

## 2016-02-28 MED ORDER — ASPIRIN 81 MG PO CHEW
81.0000 mg | CHEWABLE_TABLET | Freq: Every day | ORAL | Status: DC
  Administered 2016-02-28 – 2016-03-07 (×9): 81 mg
  Filled 2016-02-28 (×9): qty 1

## 2016-02-28 MED ORDER — VITAL AF 1.2 CAL PO LIQD
1000.0000 mL | ORAL | Status: DC
  Administered 2016-02-28 – 2016-02-29 (×3): 1000 mL

## 2016-02-28 MED ORDER — PENICILLIN G POTASSIUM 5000000 UNITS IJ SOLR
4.0000 10*6.[IU] | Freq: Four times a day (QID) | INTRAVENOUS | Status: DC
  Administered 2016-02-28 – 2016-03-03 (×16): 4 10*6.[IU] via INTRAVENOUS
  Filled 2016-02-28 (×20): qty 4

## 2016-02-28 MED ORDER — POTASSIUM CHLORIDE 20 MEQ/15ML (10%) PO SOLN: 40.0000 meq | Freq: Once | ORAL | Status: DC

## 2016-02-28 MED ORDER — PROPOFOL 1000 MG/100ML IV EMUL
0.0000 ug/kg/min | INTRAVENOUS | Status: DC
  Administered 2016-02-28: 5 ug/kg/min via INTRAVENOUS
  Administered 2016-02-28: 10.2 ug/kg/min via INTRAVENOUS
  Administered 2016-02-29 (×2): 20 ug/kg/min via INTRAVENOUS
  Administered 2016-02-29: 10 ug/kg/min via INTRAVENOUS
  Administered 2016-03-01 – 2016-03-03 (×6): 20 ug/kg/min via INTRAVENOUS
  Administered 2016-03-03 – 2016-03-04 (×2): 25 ug/kg/min via INTRAVENOUS
  Administered 2016-03-04: 20 ug/kg/min via INTRAVENOUS
  Administered 2016-03-04: 40 ug/kg/min via INTRAVENOUS
  Administered 2016-03-04: 35 ug/kg/min via INTRAVENOUS
  Administered 2016-03-05: 20 ug/kg/min via INTRAVENOUS
  Administered 2016-03-05: 35 ug/kg/min via INTRAVENOUS
  Administered 2016-03-05: 30 ug/kg/min via INTRAVENOUS
  Administered 2016-03-05: 40 ug/kg/min via INTRAVENOUS
  Administered 2016-03-06 (×3): 30 ug/kg/min via INTRAVENOUS
  Administered 2016-03-06: 35 ug/kg/min via INTRAVENOUS
  Administered 2016-03-07: 15 ug/kg/min via INTRAVENOUS
  Administered 2016-03-07: 25 ug/kg/min via INTRAVENOUS
  Administered 2016-03-07: 30 ug/kg/min via INTRAVENOUS
  Administered 2016-03-07: 25 ug/kg/min via INTRAVENOUS
  Administered 2016-03-08: 30 ug/kg/min via INTRAVENOUS
  Filled 2016-02-28 (×31): qty 100

## 2016-02-28 MED ORDER — GABAPENTIN 600 MG PO TABS
300.0000 mg | ORAL_TABLET | Freq: Every day | ORAL | Status: DC
  Administered 2016-02-28 – 2016-03-07 (×9): 300 mg
  Filled 2016-02-28 (×9): qty 1

## 2016-02-28 MED ORDER — SODIUM CHLORIDE 0.9% FLUSH
10.0000 mL | INTRAVENOUS | Status: DC | PRN
  Filled 2016-02-28: qty 40

## 2016-02-28 MED ORDER — FREE WATER
100.0000 mL | Freq: Three times a day (TID) | Status: DC
  Administered 2016-02-28 – 2016-03-02 (×9): 100 mL

## 2016-02-28 MED ORDER — BUDESONIDE 0.5 MG/2ML IN SUSP
0.5000 mg | Freq: Two times a day (BID) | RESPIRATORY_TRACT | Status: DC
  Administered 2016-02-28 – 2016-03-08 (×19): 0.5 mg via RESPIRATORY_TRACT
  Filled 2016-02-28 (×19): qty 2

## 2016-02-28 MED ORDER — HEPARIN BOLUS VIA INFUSION
1300.0000 [IU] | Freq: Once | INTRAVENOUS | Status: AC
  Administered 2016-02-28: 1300 [IU] via INTRAVENOUS
  Filled 2016-02-28: qty 1300

## 2016-02-28 MED ORDER — HEPARIN BOLUS VIA INFUSION
1200.0000 [IU] | Freq: Once | INTRAVENOUS | Status: AC
  Administered 2016-02-28: 1200 [IU] via INTRAVENOUS
  Filled 2016-02-28: qty 1200

## 2016-02-28 NOTE — Progress Notes (Signed)
Inpatient Diabetes Program Recommendations  AACE/ADA: New Consensus Statement on Inpatient Glycemic Control (2015)  Target Ranges:  Prepandial:   less than 140 mg/dL      Peak postprandial:   less than 180 mg/dL (1-2 hours)      Critically ill patients:  140 - 180 mg/dL  Results for ELDO, WOHLER (MRN [id number]) as of 02/28/2016 08:59  Ref. Range 02/27/2016 07:09 02/27/2016 12:01 02/27/2016 17:07 02/27/2016 20:23 02/27/2016 22:36 02/28/2016 00:03 02/28/2016 03:21 02/28/2016 07:42  Glucose-Capillary Latest Ref Range: 65-99 mg/dL 295 (H) 206 (H) 221 (H) 236 (H) 260 (H) 304 (H) 236 (H) 156 (H)   Review of Glycemic Control  Diabetes history: DM2 Outpatient Diabetes medications: Lantus 15 units QHS, Novolog 5 units TID with meals, Metformin XR 1500 mg QPM with supper Current orders for Inpatient glycemic control: Lantus 15 units QPM, Novolog 0-15 units Q4H  Inpatient Diabetes Program Recommendations: Insulin - Basal: Please consider increasing Lantus to 20 units QPM.  Thanks, Barnie Alderman, RN, MSN, CDE Diabetes Coordinator Inpatient Diabetes Program [phone] (Team Pager from [place] to [place]) [phone] ([facility] office) [phone] [facility] office) [phone] [facility] [place] office)

## 2016-02-28 NOTE — Progress Notes (Signed)
ANTICOAGULATION CONSULT NOTE - Initial Consult  Pharmacy Consult for heparin Indication: chest pain/ACS  Allergies  Allergen Reactions  . Tape Rash    Patient Measurements: Height: 6' (182.9 cm) Weight: 186 lb 11.7 oz (84.7 kg) IBW/kg (Calculated) : 77.6 Heparin Dosing Weight: 81.6 kg  Vital Signs: Temp: 99.4 F (37.4 C) (04/04 1200) Temp Source: Oral (04/04 1200) BP: 101/51 mmHg (04/04 1300) Pulse Rate: 91 (04/04 1300)  Labs:  Recent Labs  02/28/2016 1914 02/27/16 0055 02/27/16 0331 02/27/16 0339 02/27/16 0832 02/27/16 1322 02/27/16 2344 02/28/16 0431 02/28/16 0953  HGB  --  11.1* 10.9*  --   --   --   --  9.7*  --   HCT  --  33.2* 33.0*  --   --   --   --  29.1*  --   PLT  --  174 158  --   --   --   --  184  --   APTT  --   --   --  34  --   --   --   --   --   LABPROT  --   --   --  17.3*  --   --   --   --   --   INR  --   --   --  1.40  --   --   --   --   --   HEPARINUNFRC  --   --   --   --   --  0.13* 0.20*  --  0.29*  CREATININE  --  2.50* 2.59*  --   --   --   --  2.19*  --   TROPONINI 2.90* 7.28*  --   --  8.10*  --   --   --   --     Estimated Creatinine Clearance: 33 mL/min (by C-G formula based on Cr of 2.19).   Medical History: Past Medical History  Diagnosis Date  . CHF (congestive heart failure) ([place])   . DM (diabetes mellitus) ([place])   . Pancreatic cyst   . Skin cancer   . DDD (degenerative disc disease), thoracic   . Renal failure   . Respiratory failure (HCC)     Medications:  Infusions:  . feeding supplement (VITAL AF 1.2 CAL) 1,000 mL (02/28/16 1250)  . fentaNYL infusion INTRAVENOUS 50 mcg/hr (02/28/16 1245)  . heparin 1,400 Units/hr (02/28/16 0700)  . insulin (NOVOLIN-R) infusion 2.3 Units/hr (02/28/16 1317)  . norepinephrine 15 mcg/min (02/28/16 1036)  . propofol (DIPRIVAN) infusion 20 mcg/kg/min (02/28/16 1259)    Assessment: 16 yom with rising troponin, pharmacy consulted to dose heparin for ACS.  Goal of Therapy:   Heparin level 0.3-0.7 units/ml Monitor platelets by anticoagulation protocol: Yes   Plan:  Heparin level is slightly below goal so will bolus heparin 1300 units iv once and increase infusion to 1550 units/hr. Will recheck a HL in 8 hours.    Ulice Dash D, Pharm.D., BCPS Clinical Pharmacist 02/28/2016,2:07 PM

## 2016-02-28 NOTE — Progress Notes (Signed)
   Subjective: s/p R knee I&D for Septic Joint, 03/10/2016 Intubated  Objective: Vital signs in last 24 hours: Temp:  [98.8 F (37.1 C)-100.9 F (38.3 C)] 100.5 F (38.1 C) (04/04 0400) Pulse Rate:  [82-98] 86 (04/04 0715) Resp:  [11-26] 22 (04/04 0715) BP: (73-127)/(42-117) 96/57 mmHg (04/04 0715) SpO2:  [93 %-100 %] 98 % (04/04 0715) FiO2 (%):  [35 %-40 %] 35 % (04/04 0400) Weight:  [84.7 kg (186 lb 11.7 oz)] 84.7 kg (186 lb 11.7 oz) (04/04 0415)  Intake/Output from previous day: 04/03 0701 - 04/04 0700 In: 7313.6 [I.V.:6315.6; IV Piggyback:998] Out: 845 [Urine:805; Drains:40] Intake/Output this shift:     Recent Labs  03/10/2016 1339 02/27/16 0055 02/27/16 0331 02/28/16 0431  HGB 11.8* 11.1* 10.9* 9.7*    Recent Labs  02/27/16 0331 02/28/16 0431  WBC 11.2* 15.2*  RBC 3.58* 3.12*  HCT 33.0* 29.1*  PLT 158 184    Recent Labs  02/27/16 0331 02/28/16 0431  NA 134* 135  K 3.9 3.1*  CL 100* 107  CO2 22 19*  BUN 78* 73*  CREATININE 2.59* 2.19*  GLUCOSE 351* 221*  CALCIUM 7.7* 6.4*    Recent Labs  02/27/16 0339  INR 1.40    EXAM General - Patient is intubated Extremity - + right knee effusion, mild warth, no erythema. Right calf soft. Dressing - Hemovac discontinued today, dressing changed  Past Medical History  Diagnosis Date  . CHF (congestive heart failure) ([place])   . DM (diabetes mellitus) ([place])   . Pancreatic cyst   . Skin cancer   . DDD (degenerative disc disease), thoracic   . Renal failure   . Respiratory failure (HCC)     Assessment/Plan:       Principal Problem:   Septic arthritis of knee, right (HCC) Active Problems:   ARF (acute renal failure) (HCC)   Acute bronchitis   Hyponatremia   NSTEMI (non-ST elevated myocardial infarction) (HCC)   Pressure ulcer   Septic shock (HCC)   Acute respiratory failure (HCC)  Estimated body mass index is 25.32 kg/(m^2) as calculated from the following:   Height as of this encounter: 6'  (1.829 m).   Weight as of this encounter: 84.7 kg (186 lb 11.7 oz).  Plan: Continue with IV abx, ID following hemovac removed today   DVT Prophylaxis - heparin  T. Rachelle Hora, PA-C [facility] 02/28/2016, 8:41 AM

## 2016-02-28 NOTE — Clinical Social Work Note (Signed)
CSW was consulted by admitting physician for skilled rehab placement. Patient has not been able to be evaluated yet by PT and as of last evening, patient is now on a ventilator. CSW will follow up with family should patient remain on ventilator for an extended period of time. If patient is able to come off the ventilator and is medically cleared to participate with PT, CSW will follow up on recommendations made. Shela Leff MSW,LCSW

## 2016-02-28 NOTE — Consult Note (Signed)
[facility]  CARDIOLOGY CONSULT NOTE  Patient ID: John Schultz MRN: [id number] DOB/AGE: Jan 22, 1941 75 y.o.  Admit date: 03/21/2016 Referring Physician Dr. Anselm Jungling Primary Physician   Primary Cardiologist Dr. Ubaldo Glassing Reason for Consultation septic shock, wide complex tachycardia  HPI: Pt is a 75 yo male admitted with a septic knee. Pt also has history of cad s/p cabg, dm and ckd. Had 4 days of right knee pain with fevers and chills. This was lavaged and post lavage procedure developed wide complex tachycardia and progressive hypotension consistant with sepsis. He was intubated and sedated. His serum troponin was elevated  Felt to be secondary to demand ischemia vs acs. He is currently intubated and sedated   ROS Unable to obtain ros due to sedation  Past Medical History  Diagnosis Date  . CHF (congestive heart failure) ([place])   . DM (diabetes mellitus) ([place])   . Pancreatic cyst   . Skin cancer   . DDD (degenerative disc disease), thoracic   . Renal failure   . Respiratory failure (HCC)     Family History  Problem Relation Age of Onset  . CAD Mother   . Hypertension Mother   . Hypertension Father   . Prostate cancer Father   . Colon cancer Brother     Social History   Social History  . Marital Status: Married    Spouse Name: N/A  . Number of Children: N/A  . Years of Education: N/A   Occupational History  . Not on file.   Social History Main Topics  . Smoking status: Never Smoker   . Smokeless tobacco: Never Used  . Alcohol Use: No  . Drug Use: No  . Sexual Activity: Not on file   Other Topics Concern  . Not on file   Social History Narrative    Past Surgical History  Procedure Laterality Date  . Aortoiliac bypass    . Cholecystectomy    . Kyphoplasty       Prescriptions prior to admission  Medication Sig Dispense Refill Last Dose  . acetaminophen (TYLENOL) 500 MG tablet Take 1,000 mg by mouth every 8 (eight)  hours as needed for mild pain, moderate pain or fever.   03/23/2016 at Unknown time  . alendronate (FOSAMAX) 70 MG tablet Take 70 mg by mouth once a week. Take with a full glass of water on an empty stomach. Take on Sunday.   02/19/2016  . amLODipine (NORVASC) 5 MG tablet Take 5 mg by mouth daily.   02/24/2016  . aspirin 81 MG EC tablet Take 81 mg by mouth daily. Swallow whole.   02/24/2016  . B Complex Vitamins (VITAMIN B-COMPLEX) TABS Take 1 tablet by mouth daily.   02/24/2016  . calcium-vitamin D (OSCAL WITH D) 500-200 MG-UNIT tablet Take 1 tablet by mouth daily.   02/24/2016  . gabapentin (NEURONTIN) 300 MG capsule Take 300-600 mg by mouth See admin instructions. Take 1 capsule by mouth in the morning, Take 1 capsule by mouth at lunch, and take 2 capsules (600mg ) by mouth every night at bedtime.   02/24/2016  . insulin aspart (NOVOLOG) 100 UNIT/ML FlexPen Inject 5 Units into the skin 3 (three) times daily with meals. **If <80 do not take! If >200 use 2 units every 50 over 200.**   03/24/2016 at Unknown time  . insulin glargine (LANTUS) 100 unit/mL SOPN Inject 15 Units into the skin every evening.   02/23/2016  . isosorbide mononitrate (IMDUR) 30  MG 24 hr tablet Take 30 mg by mouth daily.   02/24/2016  . lovastatin (MEVACOR) 40 MG tablet Take 80 mg by mouth at bedtime.   02/23/2016  . metFORMIN (GLUCOPHAGE) 500 MG tablet Take 1,500 mg by mouth daily with supper.   02/23/2016  . Multiple Vitamin (MULTIVITAMIN) tablet Take 1 tablet by mouth daily.   02/24/2016  . torsemide (DEMADEX) 20 MG tablet Take 40 mg by mouth every morning.   02/24/2016  . valsartan (DIOVAN) 160 MG tablet Take 80-160 mg by mouth See admin instructions. Take 1/2 tablet (80mg ) by mouth in the morning, and 1 tablet by mouth every night at bedtime.   02/24/2016    Physical Exam: Blood pressure 96/57, pulse 86, temperature 100.5 F (38.1 C), temperature source Oral, resp. rate 22, height 6' (1.829 m), weight 84.7 kg (186 lb 11.7 oz), SpO2 98 %.    Wt Readings from Last 1 Encounters:  02/28/16 84.7 kg (186 lb 11.7 oz)     General appearance: sedated Head: Normocephalic, without obvious abnormality, atraumatic Resp: rhonchi bilaterally Cardio: irregularly irregular rhythm GI: soft, non-tender; bowel sounds normal; no masses,  no organomegaly Extremities: edema 2+ edema Neurologic: Mental status: alertness: sedated  Labs:   Lab Results  Component Value Date   WBC 15.2* 02/28/2016   HGB 9.7* 02/28/2016   HCT 29.1* 02/28/2016   MCV 93.4 02/28/2016   PLT 184 02/28/2016    Recent Labs Lab 02/27/16 0055  02/28/16 0431  NA 132*  < > 135  K 4.1  < > 3.1*  CL 97*  < > 107  CO2 20*  < > 19*  BUN 76*  < > 73*  CREATININE 2.50*  < > 2.19*  CALCIUM 7.6*  < > 6.4*  PROT 6.1*  --   --   BILITOT 1.4*  --   --   ALKPHOS 58  --   --   ALT 21  --   --   AST 51*  --   --   GLUCOSE 374*  < > 221*  < > = values in this interval not displayed. Lab Results  Component Value Date   CKTOTAL 104 03/18/2015   CKMB 10.5* 03/18/2015   TROPONINI 8.10* 02/27/2016        ASSESSMENT AND PLAN:  Pt with history of cad s/p cabg admitted with a septic knee. Treated with lavage with post op course complicated by sepsis requiring levophed for pressure support and intubation . Is on antibiotics. Troponin is elevated. May be secondary to demand with sepsis but may also represent progression of cad. When improved form sepsis, will need further evaluation. Signed: Teodoro Spray MD, [facility] 02/28/2016, 7:39 AM

## 2016-02-28 NOTE — H&P (Addendum)
[facility] Consultation      Name: John Schultz MRN: [id number] DOB: 04-04-1941    ADMISSION DATE:  03/21/2016    CHIEF COMPLAINT:   Acute resp distress  HISTORY OF PRESENT ILLNESS   Patient intubated last night, remains on full vent support On vasopressors  Review of Systems  Unable to perform ROS: critical illness      VITAL SIGNS    Temp:  [98.8 F (37.1 C)-100.9 F (38.3 C)] 100.5 F (38.1 C) (04/04 0400) Pulse Rate:  [82-98] 86 (04/04 0700) Resp:  [11-28] 22 (04/04 0700) BP: (73-127)/(42-117) 95/57 mmHg (04/04 0700) SpO2:  [93 %-100 %] 100 % (04/04 0700) FiO2 (%):  [35 %-40 %] 35 % (04/04 0400) Weight:  [186 lb 11.7 oz (84.7 kg)] 186 lb 11.7 oz (84.7 kg) (04/04 0415) HEMODYNAMICS: CVP:  [7 mmHg-29 mmHg] 10 mmHg VENTILATOR SETTINGS: Vent Mode:  [-] PRVC FiO2 (%):  [35 %-40 %] 35 % Set Rate:  [22 bmp] 22 bmp Vt Set:  [500 mL] 500 mL PEEP:  [5 cmH20] 5 cmH20 INTAKE / OUTPUT:  Intake/Output Summary (Last 24 hours) at 02/28/16 0724 Last data filed at 02/28/16 0600  Gross per 24 hour  Intake 7120.8 ml  Output    845 ml  Net 6275.8 ml       PHYSICAL EXAM   Physical Exam  Constitutional: No distress.  intubated  HENT:  Head: Normocephalic and atraumatic.  Eyes: Pupils are equal, round, and reactive to light. No scleral icterus.  Neck: Normal range of motion. Neck supple.  Cardiovascular: Normal rate and regular rhythm.   No murmur heard. Pulmonary/Chest: No respiratory distress. He has no wheezes. He has no rales.  Abdominal: Soft. He exhibits no distension. There is no tenderness.  Musculoskeletal: He exhibits no edema.  Neurological:  GCS<8T  Skin: Skin is warm. No rash noted. He is not diaphoretic.      LABS   LABS:  CBC  Recent Labs Lab 02/27/16 0055 02/27/16 0331 02/28/16 0431  WBC 12.7* 11.2* 15.2*  HGB 11.1* 10.9* 9.7*  HCT 33.2* 33.0* 29.1*  PLT 174 158 184   Coag's  Recent Labs Lab  02/27/16 0339  APTT 34  INR 1.40   BMET  Recent Labs Lab 02/27/16 0055 02/27/16 0331 02/28/16 0431  NA 132* 134* 135  K 4.1 3.9 3.1*  CL 97* 100* 107  CO2 20* 22 19*  BUN 76* 78* 73*  CREATININE 2.50* 2.59* 2.19*  GLUCOSE 374* 351* 221*   Electrolytes  Recent Labs Lab 02/27/16 0055 02/27/16 0331 02/28/16 0431  CALCIUM 7.6* 7.7* 6.4*  MG  --  1.8  --    Sepsis Markers  Recent Labs Lab 02/28/2016 1914 02/27/16 0339 02/27/16 1200  LATICACIDVEN 2.5* 2.2* 1.3  PROCALCITON  --  22.50  --    ABG  Recent Labs Lab 03/13/2016 2146 02/27/16 0423 02/27/16 1646  PHART 7.40 7.34* 7.34*  PCO2ART 32 43 35  PO2ART 113* 93 92   Liver Enzymes  Recent Labs Lab 02/27/16 0055  AST 51*  ALT 21  ALKPHOS 58  BILITOT 1.4*  ALBUMIN 2.4*   Cardiac Enzymes  Recent Labs Lab 03/08/2016 1914 02/27/16 0055 02/27/16 0832  TROPONINI 2.90* 7.28* 8.10*   Glucose  Recent Labs Lab 02/27/16 1201 02/27/16 1707 02/27/16 2023 02/27/16 2236 02/28/16 0003 02/28/16 0321  GLUCAP 206* 221* 236* 260* 304* 236*     Recent Results (from the past 240 hour(s))  Blood culture (  routine x 2)     Status: None (Preliminary result)   Collection Time: 03/07/2016 12:59 PM  Result Value Ref Range Status   Specimen Description BLOOD LEFT FOREARM  Final   Special Requests BOTTLES DRAWN AEROBIC AND ANAEROBIC  1CC  Final   Culture NO GROWTH 1 DAY  Final   Report Status PENDING  Incomplete  Rapid Influenza A&B Antigens ([place] only)     Status: None   Collection Time: 02/28/2016  1:00 PM  Result Value Ref Range Status   Influenza A ([facility]) NEGATIVE NEGATIVE Final   Influenza B ([facility]) NEGATIVE NEGATIVE Final  Blood culture (routine x 2)     Status: None (Preliminary result)   Collection Time: 03/19/2016  1:40 PM  Result Value Ref Range Status   Specimen Description BLOOD LEFT FOREARM  Final   Special Requests BOTTLES DRAWN AEROBIC AND ANAEROBIC  4CC  Final   Culture NO GROWTH 1 DAY  Final    Report Status PENDING  Incomplete  Body fluid culture     Status: None (Preliminary result)   Collection Time: 03/23/2016  3:05 PM  Result Value Ref Range Status   Specimen Description R Knee  Final   Special Requests NONE  Final   Gram Stain   Final    MODERATE WBC SEEN MODERATE GRAM POSITIVE COCCI IN PAIRS AND CHAINS    Culture   Final    HEAVY GROWTH GROUP A STREP (S.PYOGENES) ISOLATED There is no known Penicillin Resistant Beta Streptococcus in the [place] For patients that are Penicillin-allergic, Erythromycin is 85-94% susceptible, and Clindamycin is 80% susceptible.  Contact Microbiology within 7 days if sensitivity testing is  required.      Report Status PENDING  Incomplete  Body fluid culture     Status: None (Preliminary result)   Collection Time: 03/09/2016  4:10 PM  Result Value Ref Range Status   Specimen Description R Knee  Final   Special Requests NONE  Final   Gram Stain   Final    MANY WBC SEEN MANY GRAM POSITIVE COCCI IN PAIRS AND CHAINS    Culture   Final    HEAVY GROWTH GROUP A STREP (S.PYOGENES) ISOLATED There is no known Penicillin Resistant Beta Streptococcus in the [place] For patients that are Penicillin-allergic, Erythromycin is 85-94% susceptible, and Clindamycin is 80% susceptible.  Contact Microbiology within 7 days if sensitivity testing is  required.      Report Status PENDING  Incomplete  MRSA PCR Screening     Status: None   Collection Time: 02/27/16  3:35 AM  Result Value Ref Range Status   MRSA by PCR NEGATIVE NEGATIVE Final    Comment:        The GeneXpert MRSA Assay (FDA approved for NASAL specimens only), is one component of a comprehensive MRSA colonization surveillance program. It is not intended to diagnose MRSA infection nor to guide or monitor treatment for MRSA infections.      Current facility-administered medications:  .  0.9 %  sodium chloride infusion, , Intravenous, Continuous, Mikael Spray, NP, Last Rate: 125 mL/hr at  02/28/16 0600 .  0.9 %  sodium chloride infusion, 750 mL, Intravenous, PRN, Mikael Spray, NP, Last Rate: 500 mL/hr at 02/28/16 0200, 750 mL at 02/28/16 0200 .  acetaminophen (TYLENOL) tablet 650 mg, 650 mg, Oral, Q6H PRN **OR** acetaminophen (TYLENOL) suppository 650 mg, 650 mg, Rectal, Q6H PRN, Idelle Crouch, MD .  antiseptic oral rinse (CPC / CETYLPYRIDINIUM CHLORIDE  0.05%) solution 7 mL, 7 mL, Mouth Rinse, BID, Mikael Spray, NP, 7 mL at 02/27/16 2231 .  antiseptic oral rinse (CPC / CETYLPYRIDINIUM CHLORIDE 0.05%) solution 7 mL, 7 mL, Mouth Rinse, q12n4p, Vaughan Basta, MD, 7 mL at 02/27/16 1600 .  aspirin EC tablet 81 mg, 81 mg, Oral, Daily, Idelle Crouch, MD, 81 mg at 02/27/16 0951 .  bisacodyl (DULCOLAX) suppository 10 mg, 10 mg, Rectal, Daily PRN, Idelle Crouch, MD .  chlorhexidine (PERIDEX) 0.12 % solution 15 mL, 15 mL, Mouth Rinse, BID, Vaughan Basta, MD, 15 mL at 02/27/16 2229 .  clindamycin (CLEOCIN) IVPB 900 mg, 900 mg, Intravenous, 3 times per day, Leonel Ramsay, MD, 900 mg at 02/28/16 0520 .  docusate sodium (COLACE) capsule 100 mg, 100 mg, Oral, BID, Idelle Crouch, MD, 100 mg at 02/27/16 0951 .  fentaNYL 2568mg in NS 2533m(1059mml) infusion-PREMIX, 40 mcg/hr, Intravenous, Continuous, KurFlora LippsD, Last Rate: 5 mL/hr at 02/28/16 0600, 50 mcg/hr at 02/28/16 0600 .  gabapentin (NEURONTIN) capsule 300 mg, 300 mg, Oral, QHS, Harmeet Singh, MD, 300 mg at 02/27/16 2229 .  haloperidol lactate (HALDOL) injection 1 mg, 1 mg, Intravenous, Q6H PRN, RimTheodoro GristD .  heparin ADULT infusion 100 units/mL (25000 units/250 mL), 1,400 Units/hr, Intravenous, Continuous, VaiVaughan BastaD, Last Rate: 14 mL/hr at 02/28/16 0600, 1,400 Units/hr at 02/28/16 0600 .  insulin aspart (novoLOG) injection 0-15 Units, 0-15 Units, Subcutaneous, 6 times per day, RakRigoberto NoelD, 5 Units at 02/28/16 0321 .  insulin glargine (LANTUS) injection 15 Units, 15  Units, Subcutaneous, QPM, JefIdelle CrouchD, 15 Units at 02/27/16 2233 .  ipratropium-albuterol (DUONEB) 0.5-2.5 (3) MG/3ML nebulizer solution 3 mL, 3 mL, Nebulization, QID, JefIdelle CrouchD, 3 mL at 02/27/16 1939 .  mometasone-formoterol (DULERA) 200-5 MCG/ACT inhaler 2 puff, 2 puff, Inhalation, BID, JefIdelle CrouchD, 2 puff at 02/27/16 0810 .  morphine 2 MG/ML injection 2 mg, 2 mg, Intravenous, Q2H PRN, JefIdelle CrouchD, 2 mg at 02/27/16 0041 .  norepinephrine (LEVOPHED) 4mg54m D5W 250mL74mmix infusion, 0-40 mcg/min, Intravenous, Titrated, VaibhVaughan Basta Last Rate: 48.8 mL/hr at 02/28/16 0600, 13.013 mcg/min at 02/28/16 0600 .  ondansetron (ZOFRAN) tablet 4 mg, 4 mg, Oral, Q6H PRN **OR** ondansetron (ZOFRAN) injection 4 mg, 4 mg, Intravenous, Q6H PRN, JeffrIdelle Crouch.  pantoprazole (PROTONIX) injection 40 mg, 40 mg, Intravenous, Q24H, MagadMikael Spray 40 mg at 02/27/16 0956 .  penicillin G potassium 4 Million Units in dextrose 5 % 250 mL IVPB, 4 Million Units, Intravenous, 6 times per day, DavidLeonel Ramsay 4 Million Units at 02/28/16 0424 .  potassium chloride 20 MEQ/15ML (10%) solution 40 mEq, 40 mEq, Per Tube, Once, DanieRaylene Miyamoto.  pravastatin (PRAVACHOL) tablet 40 mg, 40 mg, Oral, q1800, JeffrIdelle Crouch 40 mg at 02/27/16 1800 .  sodium chloride flush (NS) 0.9 % injection 3 mL, 3 mL, Intravenous, Q12H, JeffrIdelle Crouch 3 mL at 02/27/16 2231  IMAGING    Dg Abd 1 View  02/27/2016  CLINICAL DATA:  Nasogastric tube placement EXAM: ABDOMEN - 1 VIEW COMPARISON:  02/27/2016 at 17:08 FINDINGS: Nasogastric tube is incompletely imaged but it extends into the stomach and appears to curl up into the fundus although the tip is off the superior edge of the image. Visible abdominal gas pattern is grossly unremarkable. IMPRESSION: Nasogastric tube reaches the stomach, tip is probably in  the fundus. Electronically Signed   By: Andreas Newport  M.D.   On: 02/27/2016 22:02   Dg Abd 1 View  02/27/2016  CLINICAL DATA:  OG tube placement EXAM: ABDOMEN - 1 VIEW COMPARISON:  Chest x-ray same day FINDINGS: There is normal small bowel gas pattern. Surgical clips are noted in right and left upper abdomen. Prior vertebroplasty noted upper lumbar spine. The patient is status post median sternotomy. No NG tube is identified. Partially visualized endotracheal tube with tip about 3 cm above the carina. Right IJ central line is unchanged in position. IMPRESSION: The patient is status post median sternotomy. No NG tube is identified. Clinical correlation is necessary. Partially visualized endotracheal tube with tip about 3 cm above the carina. Right IJ central line is unchanged in position. I discussed with patient's nurse in ICU, Diane Electronically Signed   By: Lahoma Crocker M.D.   On: 02/27/2016 17:47   Dg Chest Port 1 View  02/27/2016  CLINICAL DATA:  Endotracheal tube placement, respiratory failure. EXAM: PORTABLE CHEST 1 VIEW COMPARISON:  Same day. FINDINGS: Stable cardiomediastinal silhouette. Status post coronary artery bypass graft. Endotracheal tube is seen in grossly good position projected over tracheal air shadow with distal tip 3 cm above the carina. Atherosclerosis of thoracic aorta is noted. Hypoinflation of the lungs is noted. Mild bibasilar subsegmental atelectasis is noted with associated left pleural effusion. No pneumothorax is noted. Right internal jugular catheter is unchanged in position. IMPRESSION: Endotracheal tube in grossly good position. Hypoinflation of the lungs is noted. Stable bibasilar subsegmental atelectasis with associated mild left pleural effusion. Electronically Signed   By: Marijo Conception, M.D.   On: 02/27/2016 16:23      Indwelling Urinary Catheter continued, requirement due to   Reason to continue Indwelling Urinary Catheter for strict Intake/Output monitoring for hemodynamic instability   Central Line continued,  requirement due to             INDWELLING DEVICES:: RT IJ CVL 4/3>>> ETT 8.0 4/3>>>  MICRO DATA: MRSA PCR negative Urine  Blood Resp  WOUND CX: MANY GRAM POSITIVE COCCI IN PAIRS AND CHAINS  ANTIMICROBIALS: Zosyn and Vancomycin>>4/3 Clindamycin 4/3>>>>>    ASSESSMENT/PLAN  75 yo white male admitted to ICU for acute septic shock with resp distress from severe acidosis and septic arthritis Patient at high risk for cardiac arrest and death, intubated for acute resp failure complicated by NSTEMI and ARF  PULMONARY 1.Respiratory Failure -continue Full MV support -continue Bronchodilator Therapy -Wean Fio2 and PEEP as tolerated -will perform SAT/SBt when respiratory parameters are met    CARDIOVASCULAR-NSTEMI Septic shock -use vasopressors to keep MAP>65 -follow ABG and LA -follow up cultures -follow ID recs -may consdier stress dose steroids -follow up cardiology recs    RENAL Follow chem 7 -ARf from ATN-cont IVFs, check UO  GASTROINTESTINAL Start TF's if tube available  HEMATOLOGIC Follow CBC  INFECTIOUS Infected RT knee -abx as prescribed -follow ortho recs  NEUROLOGIC encphalopathy from acidosis Sedation-RAS goal -1    I have personally obtained a history, examined the patient, evaluated laboratory and independently reviewed  imaging results, formulated the assessment and plan and placed orders.  The Patient requires high complexity decision making for assessment and support, frequent evaluation and titration of therapies, application of advanced monitoring technologies and extensive interpretation of multiple databases. Critical Care Time devoted to patient care services described in this note is 35 minutes.   Overall, patient is critically ill, prognosis is guarded. Patient at  high risk for cardiac arrest and death.    Corrin Parker, M.D.  [facility]  Medical Director [place] Director  [facility] Cardio-Pulmonary Department

## 2016-02-28 NOTE — Progress Notes (Signed)
Discussed with Kathlee Nations, RN from [facility] about patients alert calcium of 6.4, which corrects to 7.6 due to an albumin level of 2.4.  Dr. Titus Mould aware.

## 2016-02-28 NOTE — Progress Notes (Signed)
Initial Nutrition Assessment    INTERVENTION:   EN: received verbal order from MD Kasa to start TF via OG tube. Recommend starting Vital AF 1.2 at rate of 20 ml/hr, initial goal of 70 ml/hr providing 2016 kcals,  126 g of protein and 1361 mL of free water. Insulin drip to be started and NS at 125 ml/hr discontinued today, pt also starting on diprivan. Will reassess TF and goal rate with calories from diprivan taken into account on follow-up  NUTRITION DIAGNOSIS:   Inadequate oral intake related to acute illness as evidenced by NPO status.  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, respiratory distress with severe acidosis requiring intubation on 02/27/16.  Patient is currently intubated on ventilator support, sedated, on levophed MV: 11 L/min Temp (24hrs), Avg:99.9 F (37.7 C), Min:98.8 F (37.1 C), Max:100.9 F (38.3 C)   Past Medical History  Diagnosis Date  . CHF (congestive heart failure) ([place])   . DM (diabetes mellitus) ([place])   . Pancreatic cyst   . Skin cancer   . DDD (degenerative disc disease), thoracic   . Renal failure   . Respiratory failure ([place])      Diet Order:  Diet NPO time specified Except for: Sips with Meds   Digestive System:16 french OG to LIS with bilious drainage with tip in stomach, abdomen soft, BS active  Skin:   (stage I pressure ulcer on buttock)  Last BM:  03/18/2016   Glucose Profile:   Recent Labs  02/28/16 0003 02/28/16 0321 02/28/16 0742  GLUCAP 304* 236* 156*    Meds: ss novolog, lantus, potassium chloride, NS at 125 ml/hr, fentanyl drip, levophed   Height:   Ht Readings from Last 1 Encounters:  02/27/16 6' (1.829 m)    Weight: wt trend relatively stable as per weight encounters  Wt  Readings from Last 1 Encounters:  02/28/16 186 lb 11.7 oz (84.7 kg)   Wt Readings from Last 10 Encounters:  02/28/16 186 lb 11.7 oz (84.7 kg)  10/21/15 180 lb (81.647 kg)  04/24/15 188 lb (85.276 kg)    BMI:  Body mass index is 25.32 kg/(m^2).  Estimated Nutritional Needs:   Kcal:  2085 kcals (BEE 1625, Ve: 11, Tmax: 38.3) using wt of 84.7 kg  Protein:  128-170 g (1.5-2.0 g/kg)   Fluid:  >2 Liters  EDUCATION NEEDS:   Education needs no appropriate at this time  [place], [place], [place] [phone] Pager  [phone] Weekend/On-Call Pager

## 2016-02-28 NOTE — Progress Notes (Signed)
*  PRELIMINARY RESULTS* Echocardiogram 2D Echocardiogram has been performed.  John Schultz 02/28/2016, 1:25 PM

## 2016-02-28 NOTE — Progress Notes (Signed)
ANTICOAGULATION CONSULT NOTE - Initial Consult  Pharmacy Consult for heparin Indication: chest pain/ACS  Allergies  Allergen Reactions  . Tape Rash    Patient Measurements: Height: 6' (182.9 cm) Weight: 178 lb 12.7 oz (81.1 kg) IBW/kg (Calculated) : 77.6 Heparin Dosing Weight: 81.6 kg  Vital Signs: Temp: 100.9 F (38.3 C) (04/03 2000) Temp Source: Oral (04/03 2000) BP: 88/48 mmHg (04/03 2130) Pulse Rate: 96 (04/03 2130)  Labs:  Recent Labs  03/09/2016 1339 03/24/2016 1914 02/27/16 0055 02/27/16 0331 02/27/16 0339 02/27/16 0832 02/27/16 1322 02/27/16 2344  HGB 11.8*  --  11.1* 10.9*  --   --   --   --   HCT 35.5*  --  33.2* 33.0*  --   --   --   --   PLT 162  --  174 158  --   --   --   --   APTT  --   --   --   --  34  --   --   --   LABPROT  --   --   --   --  17.3*  --   --   --   INR  --   --   --   --  1.40  --   --   --   HEPARINUNFRC  --   --   --   --   --   --  0.13* 0.20*  CREATININE 2.73*  --  2.50* 2.59*  --   --   --   --   TROPONINI  --  2.90* 7.28*  --   --  8.10*  --   --     Estimated Creatinine Clearance: 27.9 mL/min (by C-G formula based on Cr of 2.59).   Medical History: Past Medical History  Diagnosis Date  . CHF (congestive heart failure) ([place])   . DM (diabetes mellitus) ([place])   . Pancreatic cyst   . Skin cancer   . DDD (degenerative disc disease), thoracic   . Renal failure   . Respiratory failure ([facility])     Medications:  Infusions:  . sodium chloride 125 mL/hr at 02/27/16 2227  . fentaNYL infusion INTRAVENOUS 40 mcg/hr (02/27/16 1556)  . heparin 1,250 Units/hr (02/28/16 0028)  . norepinephrine 12 mcg/min (02/27/16 2002)    Assessment: 88 yom with rising troponin, pharmacy consulted to dose heparin for ACS.  Goal of Therapy:  Heparin level 0.3-0.7 units/ml Monitor platelets by anticoagulation protocol: Yes   Plan:  Heparin level is below goal so will bolus heparin 2400 units and increase heparin infusion to 1250 units/hr.  Will recheck a HL in 8 hours.   4/3 23:00 heparin level 0.20. 1200 unit bolus and increase rate to 1400 units/hr. Recheck in 8 hours.  Irja Wheless S, Pharm.D., BCPS Clinical Pharmacist 02/28/2016,12:40 AM

## 2016-02-28 NOTE — Clinical Documentation Improvement (Signed)
Critical Care  Can the diagnosis of pressure ulcer be further specified?   Document if pressure ulcer with stage is Present on Admission   Document Site with laterality - Elbow, Back (upper/lower), Sacral, Hip, Buttock, Ankle, Heel, Head, Other (Specify)  Pressure Ulcer Stage - Stage1, Stage 2, Stage 3, Stage 4, Unstageable, Unspecified, Unable to Clinically Determine  Document any associated diagnoses/conditions such as: with gangrene  Other  Clinically Undetermined    Please exercise your independent, professional judgment when responding. A specific answer is not anticipated or expected.   Thank You,  Rolm Gala, RN, [place] [phone]

## 2016-02-28 NOTE — Progress Notes (Signed)
eLink Physician-Brief Progress Note Patient Name: John Schultz DOB: September 18, 1941 MRN: [id number]   Date of Service  02/28/2016  HPI/Events of Note  k low  eICU Interventions  k      Intervention Category Intermediate Interventions: Electrolyte abnormality - evaluation and management  Raylene Miyamoto. 02/28/2016, 5:24 AM

## 2016-02-28 NOTE — Care Management Note (Signed)
Case Management Note  Patient Details  Name: John Schultz MRN: [id number] Date of Birth: 1941-06-29  Subjective/Objective:                  Patient vented and sedated. I met with patient's wife and his sister in law was also present. Patient has had home health services but wife cannot remember name of agency. Patient is not on O2 at home per wife.   Action/Plan: RNCM will continue to follow progression.   Expected Discharge Date:                  Expected Discharge Plan:     In-House Referral:     Discharge planning Services  CM Consult  Post Acute Care Choice:    Choice offered to:  Spouse  DME Arranged:    DME Agency:     HH Arranged:    [place] Agency:     Status of Service:  In process, will continue to follow  Medicare Important Message Given:    Date Medicare IM Given:    Medicare IM give by:    Date Additional Medicare IM Given:    Additional Medicare Important Message give by:     If discussed at [place] of Stay Meetings, dates discussed:    Additional Comments:  Marshell Garfinkel, RN 02/28/2016, 2:03 PM

## 2016-02-28 NOTE — Progress Notes (Signed)
Subjective:  Patient known to our practice from outpatient. He is followed for CKD st 3 Admitted for septic rt knee.  Also Dx with NSTEMI  Intubated yesterday afternoon Currently on ventilator support Family at bedside     Objective:  Vital signs in last 24 hours:  Temp:  [99 F (37.2 C)-100.9 F (38.3 C)] 99.6 F (37.6 C) (04/04 0800) Pulse Rate:  [85-98] 90 (04/04 1100) Resp:  [11-26] 22 (04/04 1100) BP: (73-127)/(42-117) 105/56 mmHg (04/04 1100) SpO2:  [93 %-100 %] 99 % (04/04 1100) FiO2 (%):  [30 %-35 %] 30 % (04/04 1100) Weight:  [84.7 kg (186 lb 11.7 oz)] 84.7 kg (186 lb 11.7 oz) (04/04 0415)  Weight change: 3.052 kg (6 lb 11.7 oz) Filed Weights   03/21/2016 1223 02/27/16 0330 02/28/16 0415  Weight: 81.647 kg (180 lb) 81.1 kg (178 lb 12.7 oz) 84.7 kg (186 lb 11.7 oz)    Intake/Output:    Intake/Output Summary (Last 24 hours) at 02/28/16 1222 Last data filed at 02/28/16 0800  Gross per 24 hour  Intake 4029.98 ml  Output    845 ml  Net 3184.98 ml     Physical Exam: General: Critically ill appearing, lethargic  HEENT ETT  Neck supple  Pulm/lungs Vent assisted  CVS/Heart Regular, soft systolic murmur  Abdomen:  Soft, non distended  Extremities: No peripheral edema, rt knee drain in place  Neurologic: sedated  Skin: Heel skin breakdown    Foley       Basic Metabolic Panel:   Recent Labs Lab 03/25/2016 1339 02/27/16 0055 02/27/16 0331 02/28/16 0431  NA 130* 132* 134* 135  K 4.3 4.1 3.9 3.1*  CL 90* 97* 100* 107  CO2 25 20* 22 19*  GLUCOSE 319* 374* 351* 221*  BUN 79* 76* 78* 73*  CREATININE 2.73* 2.50* 2.59* 2.19*  CALCIUM 8.6* 7.6* 7.7* 6.4*  MG  --   --  1.8  --      CBC:  Recent Labs Lab 03/22/2016 1339 02/27/16 0055 02/27/16 0331 02/28/16 0431  WBC 16.8* 12.7* 11.2* 15.2*  HGB 11.8* 11.1* 10.9* 9.7*  HCT 35.5* 33.2* 33.0* 29.1*  MCV 91.6 93.0 92.2 93.4  PLT 162 174 158 184      Microbiology:  Recent Results (from the  past 720 hour(s))  Blood culture (routine x 2)     Status: None (Preliminary result)   Collection Time: 03/06/2016 12:59 PM  Result Value Ref Range Status   Specimen Description BLOOD LEFT FOREARM  Final   Special Requests BOTTLES DRAWN AEROBIC AND ANAEROBIC  1CC  Final   Culture  Setup Time   Final    GRAM POSITIVE COCCI AEROBIC BOTTLE ONLY CRITICAL VALUE NOTED.  VALUE IS CONSISTENT WITH PREVIOUSLY REPORTED AND CALLED VALUE.    Culture   Final    GROUP A STREP (S.PYOGENES) ISOLATED AEROBIC BOTTLE ONLY SUSCEPTIBILITIES TO FOLLOW    Report Status PENDING  Incomplete  Rapid Influenza A&B Antigens ([facility] only)     Status: None   Collection Time: 03/18/2016  1:00 PM  Result Value Ref Range Status   Influenza A ([facility]) NEGATIVE NEGATIVE Final   Influenza B ([facility]) NEGATIVE NEGATIVE Final  Blood culture (routine x 2)     Status: None (Preliminary result)   Collection Time: 03/08/2016  1:40 PM  Result Value Ref Range Status   Specimen Description BLOOD LEFT FOREARM  Final   Special Requests BOTTLES DRAWN AEROBIC AND ANAEROBIC  4CC  Final  Culture  Setup Time   Final    GRAM POSITIVE COCCI IN BOTH AEROBIC AND ANAEROBIC BOTTLES CRITICAL RESULT CALLED TO, READ BACK BY AND VERIFIED WITH: [place] AT [id number] 02/27/16 CAF    Culture   Final    GROUP A STREP (S.PYOGENES) ISOLATED IN BOTH AEROBIC AND ANAEROBIC BOTTLES SUSCEPTIBILITIES TO FOLLOW    Report Status PENDING  Incomplete  Blood Culture ID Panel (Reflexed)     Status: Abnormal   Collection Time: 03/25/2016  1:40 PM  Result Value Ref Range Status   Enterococcus species NOT DETECTED NOT DETECTED Final   Vancomycin resistance NOT DETECTED NOT DETECTED Final   Listeria monocytogenes NOT DETECTED NOT DETECTED Final   Staphylococcus species NOT DETECTED NOT DETECTED Final   Staphylococcus aureus NOT DETECTED NOT DETECTED Final   Methicillin resistance NOT DETECTED NOT DETECTED Final   Streptococcus species NOT DETECTED NOT DETECTED Final    Streptococcus agalactiae NOT DETECTED NOT DETECTED Final   Streptococcus pneumoniae NOT DETECTED NOT DETECTED Final   Streptococcus pyogenes DETECTED (A) NOT DETECTED Final    Comment: CRITICAL RESULT CALLED TO, READ BACK BY AND VERIFIED WITH: NATE COOKSON AT 0426 02/27/16 CAF    Acinetobacter baumannii NOT DETECTED NOT DETECTED Final   Enterobacteriaceae species NOT DETECTED NOT DETECTED Final   Enterobacter cloacae complex NOT DETECTED NOT DETECTED Final   Escherichia coli NOT DETECTED NOT DETECTED Final   Klebsiella oxytoca NOT DETECTED NOT DETECTED Final   Klebsiella pneumoniae NOT DETECTED NOT DETECTED Final   Proteus species NOT DETECTED NOT DETECTED Final   Serratia marcescens NOT DETECTED NOT DETECTED Final   Carbapenem resistance NOT DETECTED NOT DETECTED Final   Haemophilus influenzae NOT DETECTED NOT DETECTED Final   Neisseria meningitidis NOT DETECTED NOT DETECTED Final   Pseudomonas aeruginosa NOT DETECTED NOT DETECTED Final   Candida albicans NOT DETECTED NOT DETECTED Final   Candida glabrata NOT DETECTED NOT DETECTED Final   Candida krusei NOT DETECTED NOT DETECTED Final   Candida parapsilosis NOT DETECTED NOT DETECTED Final   Candida tropicalis NOT DETECTED NOT DETECTED Final  Body fluid culture     Status: None (Preliminary result)   Collection Time: 03/03/2016  3:05 PM  Result Value Ref Range Status   Specimen Description R Knee  Final   Special Requests NONE  Final   Gram Stain   Final    MODERATE WBC SEEN MODERATE GRAM POSITIVE COCCI IN PAIRS AND CHAINS    Culture   Final    HEAVY GROWTH GROUP A STREP (S.PYOGENES) ISOLATED There is no known Penicillin Resistant Beta Streptococcus in the [place] For patients that are Penicillin-allergic, Erythromycin is 85-94% susceptible, and Clindamycin is 80% susceptible.  Contact Microbiology within 7 days if sensitivity testing is  required.      Report Status PENDING  Incomplete  Body fluid culture     Status: None (Preliminary  result)   Collection Time: 03/24/2016  4:10 PM  Result Value Ref Range Status   Specimen Description R Knee  Final   Special Requests NONE  Final   Gram Stain   Final    MANY WBC SEEN MANY GRAM POSITIVE COCCI IN PAIRS AND CHAINS    Culture   Final    HEAVY GROWTH GROUP A STREP (S.PYOGENES) ISOLATED There is no known Penicillin Resistant Beta Streptococcus in the [place] For patients that are Penicillin-allergic, Erythromycin is 85-94% susceptible, and Clindamycin is 80% susceptible.  Contact Microbiology within 7 days if sensitivity testing is  required.      Report Status PENDING  Incomplete  MRSA PCR Screening     Status: None   Collection Time: 02/27/16  3:35 AM  Result Value Ref Range Status   MRSA by PCR NEGATIVE NEGATIVE Final    Comment:        The GeneXpert MRSA Assay (FDA approved for NASAL specimens only), is one component of a comprehensive MRSA colonization surveillance program. It is not intended to diagnose MRSA infection nor to guide or monitor treatment for MRSA infections.   Urine culture     Status: None   Collection Time: 02/27/16  8:30 AM  Result Value Ref Range Status   Specimen Description URINE, RANDOM  Final   Special Requests NONE  Final   Culture NO GROWTH 1 DAY  Final   Report Status 02/28/2016 FINAL  Final    Coagulation Studies:  Recent Labs  02/27/16 0339  LABPROT 17.3*  INR 1.40    Urinalysis:  Recent Labs  02/27/16 0830  COLORURINE YELLOW*  LABSPEC 1.018  PHURINE 5.0  GLUCOSEU 50*  HGBUR 2+*  BILIRUBINUR NEGATIVE  KETONESUR NEGATIVE  PROTEINUR 30*  NITRITE NEGATIVE  LEUKOCYTESUR NEGATIVE      Imaging: Dg Chest 2 View  03/08/2016  CLINICAL DATA:  Nausea, vomiting, diarrhea and cough. Also weakness, congestion, fever and chills. History of CHF, diabetes. EXAM: CHEST  2 VIEW COMPARISON:  Chest x-rays dated 04/24/2015 and 03/20/2015. FINDINGS: Study is again hypoinspiratory with crowding of the perihilar bronchovascular  markings. Given the low lung volumes, lungs appear clear. Cardiomediastinal silhouette is stable in size and configuration. Median sternotomy wires are stable in alignment. Multiple old compression fracture deformities are seen within the upper lumbar spine, 1 of which is status post previous vertebroplasty. No evidence of acute osseous abnormality. Surgical clips again noted within the upper abdomen. IMPRESSION: Low lung volumes. No evidence of acute cardiopulmonary abnormality. No evidence of pneumonia. Electronically Signed   By: Franki Cabot M.D.   On: 03/17/2016 13:40   Dg Abd 1 View  02/27/2016  CLINICAL DATA:  Nasogastric tube placement EXAM: ABDOMEN - 1 VIEW COMPARISON:  02/27/2016 at 17:08 FINDINGS: Nasogastric tube is incompletely imaged but it extends into the stomach and appears to curl up into the fundus although the tip is off the superior edge of the image. Visible abdominal gas pattern is grossly unremarkable. IMPRESSION: Nasogastric tube reaches the stomach, tip is probably in the fundus. Electronically Signed   By: Andreas Newport M.D.   On: 02/27/2016 22:02   Dg Abd 1 View  02/27/2016  CLINICAL DATA:  OG tube placement EXAM: ABDOMEN - 1 VIEW COMPARISON:  Chest x-ray same day FINDINGS: There is normal small bowel gas pattern. Surgical clips are noted in right and left upper abdomen. Prior vertebroplasty noted upper lumbar spine. The patient is status post median sternotomy. No NG tube is identified. Partially visualized endotracheal tube with tip about 3 cm above the carina. Right IJ central line is unchanged in position. IMPRESSION: The patient is status post median sternotomy. No NG tube is identified. Clinical correlation is necessary. Partially visualized endotracheal tube with tip about 3 cm above the carina. Right IJ central line is unchanged in position. I discussed with patient's nurse in ICU, Diane Electronically Signed   By: Lahoma Crocker M.D.   On: 02/27/2016 17:47   Dg Chest Port  1 View  02/27/2016  CLINICAL DATA:  Endotracheal tube placement, respiratory failure. EXAM: PORTABLE CHEST 1 VIEW  COMPARISON:  Same day. FINDINGS: Stable cardiomediastinal silhouette. Status post coronary artery bypass graft. Endotracheal tube is seen in grossly good position projected over tracheal air shadow with distal tip 3 cm above the carina. Atherosclerosis of thoracic aorta is noted. Hypoinflation of the lungs is noted. Mild bibasilar subsegmental atelectasis is noted with associated left pleural effusion. No pneumothorax is noted. Right internal jugular catheter is unchanged in position. IMPRESSION: Endotracheal tube in grossly good position. Hypoinflation of the lungs is noted. Stable bibasilar subsegmental atelectasis with associated mild left pleural effusion. Electronically Signed   By: Marijo Conception, M.D.   On: 02/27/2016 16:23   Dg Chest Port 1 View  02/27/2016  CLINICAL DATA:  Central line placement.  Initial encounter. EXAM: PORTABLE CHEST 1 VIEW COMPARISON:  Chest radiograph performed earlier today at 3:52 a.m. FINDINGS: A right IJ line is noted ending about the mid to distal SVC. The lungs are hypoexpanded. Bibasilar airspace opacities may reflect atelectasis or possibly pneumonia. Mild vascular crowding is noted. Small bilateral pleural effusions are suspected. No pneumothorax is seen. The cardiomediastinal silhouette is mildly enlarged. The patient is status post median sternotomy, with evidence of prior CABG. No acute osseous abnormalities are identified. Scattered clips are noted about the upper abdomen. IMPRESSION: 1. Right IJ line noted ending about the mid to distal SVC. 2. Lungs hypoexpanded. Bibasilar airspace opacities may reflect atelectasis or possibly pneumonia. 3. Suspect small bilateral pleural effusions.  Mild cardiomegaly. Electronically Signed   By: Garald Balding M.D.   On: 02/27/2016 05:57   Dg Chest Port 1 View  02/27/2016  CLINICAL DATA:  Acute onset of respiratory  failure. Initial encounter. EXAM: PORTABLE CHEST 1 VIEW COMPARISON:  Chest radiograph performed 03/19/2016 FINDINGS: The lungs are hypoexpanded. Bibasilar airspace opacities may reflect atelectasis or pneumonia. No definite pleural effusion or pneumothorax is seen. The cardiomediastinal silhouette is borderline normal in size. The patient is status post median sternotomy, with evidence of prior CABG. Scattered clips are noted at the upper abdomen. No acute osseous abnormalities are identified. IMPRESSION: Lungs hypoexpanded. Bibasilar airspace opacities may reflect atelectasis or pneumonia. Electronically Signed   By: Garald Balding M.D.   On: 02/27/2016 04:04   Dg Knee Complete 4 Views Right  03/19/2016  CLINICAL DATA:  Acute knee pain.  Initial encounter. EXAM: RIGHT KNEE - COMPLETE 4+ VIEW COMPARISON:  None. FINDINGS: There is no evidence of acute fracture, subluxation or dislocation. A small knee effusion is present. No focal bony lesions are present. Heavy vascular calcifications are present. IMPRESSION: Small knee effusion without acute bony abnormality. Heavy vascular calcifications. Electronically Signed   By: Margarette Canada M.D.   On: 03/11/2016 13:39     Medications:   . feeding supplement (VITAL AF 1.2 CAL)    . fentaNYL infusion INTRAVENOUS Stopped (02/28/16 1048)  . heparin 1,400 Units/hr (02/28/16 0700)  . insulin (NOVOLIN-R) infusion 1 Units/hr (02/28/16 1105)  . norepinephrine 15 mcg/min (02/28/16 1036)  . propofol (DIPRIVAN) infusion 15 mcg/kg/min (02/28/16 1053)   . antiseptic oral rinse  7 mL Mouth Rinse q12n4p  . aspirin  81 mg Per Tube Daily  . budesonide (PULMICORT) nebulizer solution  0.5 mg Nebulization BID  . chlorhexidine  15 mL Mouth Rinse BID  . clindamycin (CLEOCIN) IV  900 mg Intravenous 3 times per day  . docusate  100 mg Per Tube BID  . free water  100 mL Per Tube 3 times per day  . gabapentin  300 mg Per Tube QHS  .  ipratropium-albuterol  3 mL Nebulization QID  .  pantoprazole (PROTONIX) IV  40 mg Intravenous Q24H  . pencillin G potassium IV  4 Million Units Intravenous 6 times per day  . potassium chloride  40 mEq Per Tube Once  . pravastatin  40 mg Oral q1800  . sodium chloride flush  3 mL Intravenous Q12H   acetaminophen **OR** acetaminophen, bisacodyl, haloperidol lactate, morphine injection, ondansetron **OR** ondansetron (ZOFRAN) IV  Assessment/ Plan:  75 y.o. Caucasian male  with significant medical history of coronary disease with four-vessel CABG in 1998, gallbladder rupture, extensive surgery, Diabetes with complications of retinopathy, peripheral neuropathy, peripheral vascular disease, Aorto iliac bypass, angioplasty and stent in his left leg, kyphoplasty for chronic back pain, CKD st 3 with Baseline Cr 1.5/ GFR 46  1. 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 Rt Knee septic arthritis 3. NSTEMI 4. DM-2 with CKD 5. Hypotension 6. Acute resp faliure. Intubated 4/3  Plan: Difficult situation. Patient is known vasculopath. With new NSTEMI - may need cardiac cath which will likely worsen his renal function. Concurrent septic knee is not helping.  Continue volume optimization.  S Cr is a little better. UOP 800 cc Continue supportive care    LOS: 2 John Schultz 4/4/201712:22 PM

## 2016-02-28 NOTE — Progress Notes (Signed)
[facility] INFECTIOUS DISEASE PROGRESS NOTE Date of Admission:  03/04/2016     ID: John Schultz is a 75 y.o. male with  Grp A strep septic R knee and bacteremia  Principal Problem:   Septic arthritis of knee, right (HCC) Active Problems:   ARF (acute renal failure) (HCC)   Acute bronchitis   Hyponatremia   NSTEMI (non-ST elevated myocardial infarction) (HCC)   Pressure ulcer   Septic shock (HCC)   Acute respiratory failure (HCC)   Subjective: Remains intubated, on pressor, sedated. Fever last pm   ROS  intubated  Medications:  Antibiotics Given (last 72 hours)    Date/Time Action Medication Dose Rate   02/27/16 0442 Given   vancomycin (VANCOCIN) 1,250 mg in sodium chloride 0.9 % 250 mL IVPB 1,250 mg 166.7 mL/hr   02/27/16 1742 Given   clindamycin (CLEOCIN) IVPB 900 mg 900 mg 100 mL/hr   02/27/16 1848 Given   penicillin G potassium 4 Million Units in dextrose 5 % 250 mL IVPB 4 Million Units 250 mL/hr   02/27/16 2113 Given   penicillin G potassium 4 Million Units in dextrose 5 % 250 mL IVPB 4 Million Units 250 mL/hr   02/27/16 2228 Given   clindamycin (CLEOCIN) IVPB 900 mg 900 mg 100 mL/hr   02/28/16 0021 Given   penicillin G potassium 4 Million Units in dextrose 5 % 250 mL IVPB 4 Million Units 250 mL/hr   02/28/16 0424 Given   penicillin G potassium 4 Million Units in dextrose 5 % 250 mL IVPB 4 Million Units 250 mL/hr   02/28/16 0520 Given   clindamycin (CLEOCIN) IVPB 900 mg 900 mg 100 mL/hr   02/28/16 1026 Given   penicillin G potassium 4 Million Units in dextrose 5 % 250 mL IVPB 4 Million Units 250 mL/hr   02/28/16 1303 Given   clindamycin (CLEOCIN) IVPB 900 mg 900 mg 100 mL/hr     . antiseptic oral rinse  7 mL Mouth Rinse q12n4p  . aspirin  81 mg Per Tube Daily  . budesonide (PULMICORT) nebulizer solution  0.5 mg Nebulization BID  . chlorhexidine  15 mL Mouth Rinse BID  . clindamycin (CLEOCIN) IV  900 mg Intravenous 3 times per day  . docusate  100 mg  Per Tube BID  . free water  100 mL Per Tube 3 times per day  . gabapentin  300 mg Per Tube QHS  . heparin  1,300 Units Intravenous Once  . ipratropium-albuterol  3 mL Nebulization QID  . pantoprazole (PROTONIX) IV  40 mg Intravenous Q24H  . pencillin G potassium IV  4 Million Units Intravenous 4 times per day  . potassium chloride  40 mEq Per Tube Once  . pravastatin  40 mg Oral q1800  . sodium chloride flush  3 mL Intravenous Q12H    Objective: Vital signs in last 24 hours: Temp:  [99 F (37.2 C)-100.9 F (38.3 C)] 99.4 F (37.4 C) (04/04 1200) Pulse Rate:  [85-98] 91 (04/04 1300) Resp:  [11-26] 22 (04/04 1300) BP: (73-127)/(42-117) 101/51 mmHg (04/04 1300) SpO2:  [95 %-100 %] 98 % (04/04 1300) FiO2 (%):  [30 %-35 %] 30 % (04/04 1300) Weight:  [84.7 kg (186 lb 11.7 oz)] 84.7 kg (186 lb 11.7 oz) (04/04 0415) Constitutional: intubated, critically ill appearing.  HENT: perrla, eomi Mouth/Throat: Oropharynx - etet in place  Cardiovascular: tachy, reg Pulmonary/Chest: mech bs Abdominal: Soft. Bowel sounds are normal. He exhibits no distension. There is no tenderness.  Lymphadenopathy: He has no cervical adenopathy.  Neurological: intubated, sedated  Skin: Skin is warm and dry. No rash noted. No erythema.  Psychiatric: sedated Ext - L knee wrapped post op  Lab Results  Recent Labs  02/27/16 0331 02/28/16 0431  WBC 11.2* 15.2*  HGB 10.9* 9.7*  HCT 33.0* 29.1*  NA 134* 135  K 3.9 3.1*  CL 100* 107  CO2 22 19*  BUN 78* 73*  CREATININE 2.59* 2.19*    Microbiology: Results for orders placed or performed during the hospital encounter of 03/07/2016  Blood culture (routine x 2)     Status: None (Preliminary result)   Collection Time: 02/29/2016 12:59 PM  Result Value Ref Range Status   Specimen Description BLOOD LEFT FOREARM  Final   Special Requests BOTTLES DRAWN AEROBIC AND ANAEROBIC  1CC  Final   Culture  Setup Time   Final    GRAM POSITIVE COCCI AEROBIC BOTTLE  ONLY CRITICAL VALUE NOTED.  VALUE IS CONSISTENT WITH PREVIOUSLY REPORTED AND CALLED VALUE.    Culture   Final    GROUP A STREP (S.PYOGENES) ISOLATED AEROBIC BOTTLE ONLY SUSCEPTIBILITIES TO FOLLOW    Report Status PENDING  Incomplete  Rapid Influenza A&B Antigens ([facility] only)     Status: None   Collection Time: 03/06/2016  1:00 PM  Result Value Ref Range Status   Influenza A ([facility]) NEGATIVE NEGATIVE Final   Influenza B ([facility]) NEGATIVE NEGATIVE Final  Blood culture (routine x 2)     Status: None (Preliminary result)   Collection Time: 03/18/2016  1:40 PM  Result Value Ref Range Status   Specimen Description BLOOD LEFT FOREARM  Final   Special Requests BOTTLES DRAWN AEROBIC AND ANAEROBIC  4CC  Final   Culture  Setup Time   Final    GRAM POSITIVE COCCI IN BOTH AEROBIC AND ANAEROBIC BOTTLES CRITICAL RESULT CALLED TO, READ BACK BY AND VERIFIED WITH: Cowley AT [id number] 02/27/16 [facility]    Culture   Final    GROUP A STREP (S.PYOGENES) ISOLATED IN BOTH AEROBIC AND ANAEROBIC BOTTLES SUSCEPTIBILITIES TO FOLLOW    Report Status PENDING  Incomplete  Blood Culture ID Panel (Reflexed)     Status: Abnormal   Collection Time: 02/29/2016  1:40 PM  Result Value Ref Range Status   Enterococcus species NOT DETECTED NOT DETECTED Final   Vancomycin resistance NOT DETECTED NOT DETECTED Final   Listeria monocytogenes NOT DETECTED NOT DETECTED Final   Staphylococcus species NOT DETECTED NOT DETECTED Final   Staphylococcus aureus NOT DETECTED NOT DETECTED Final   Methicillin resistance NOT DETECTED NOT DETECTED Final   Streptococcus species NOT DETECTED NOT DETECTED Final   Streptococcus agalactiae NOT DETECTED NOT DETECTED Final   Streptococcus pneumoniae NOT DETECTED NOT DETECTED Final   Streptococcus pyogenes DETECTED (A) NOT DETECTED Final    Comment: CRITICAL RESULT CALLED TO, READ BACK BY AND VERIFIED WITH: NATE COOKSON AT 0426 02/27/16 CAF    Acinetobacter baumannii NOT DETECTED NOT DETECTED Final    Enterobacteriaceae species NOT DETECTED NOT DETECTED Final   Enterobacter cloacae complex NOT DETECTED NOT DETECTED Final   Escherichia coli NOT DETECTED NOT DETECTED Final   Klebsiella oxytoca NOT DETECTED NOT DETECTED Final   Klebsiella pneumoniae NOT DETECTED NOT DETECTED Final   Proteus species NOT DETECTED NOT DETECTED Final   Serratia marcescens NOT DETECTED NOT DETECTED Final   Carbapenem resistance NOT DETECTED NOT DETECTED Final   Haemophilus influenzae NOT DETECTED NOT DETECTED Final   Neisseria meningitidis  NOT DETECTED NOT DETECTED Final   Pseudomonas aeruginosa NOT DETECTED NOT DETECTED Final   Candida albicans NOT DETECTED NOT DETECTED Final   Candida glabrata NOT DETECTED NOT DETECTED Final   Candida krusei NOT DETECTED NOT DETECTED Final   Candida parapsilosis NOT DETECTED NOT DETECTED Final   Candida tropicalis NOT DETECTED NOT DETECTED Final  Body fluid culture     Status: None (Preliminary result)   Collection Time: 03/04/2016  3:05 PM  Result Value Ref Range Status   Specimen Description R Knee  Final   Special Requests NONE  Final   Gram Stain   Final    MODERATE WBC SEEN MODERATE GRAM POSITIVE COCCI IN PAIRS AND CHAINS    Culture   Final    HEAVY GROWTH GROUP A STREP (S.PYOGENES) ISOLATED There is no known Penicillin Resistant Beta Streptococcus in the [place] For patients that are Penicillin-allergic, Erythromycin is 85-94% susceptible, and Clindamycin is 80% susceptible.  Contact Microbiology within 7 days if sensitivity testing is  required.      Report Status PENDING  Incomplete  Body fluid culture     Status: None (Preliminary result)   Collection Time: 03/10/2016  4:10 PM  Result Value Ref Range Status   Specimen Description R Knee  Final   Special Requests NONE  Final   Gram Stain   Final    MANY WBC SEEN MANY GRAM POSITIVE COCCI IN PAIRS AND CHAINS    Culture   Final    HEAVY GROWTH GROUP A STREP (S.PYOGENES) ISOLATED There is no known Penicillin  Resistant Beta Streptococcus in the [place] For patients that are Penicillin-allergic, Erythromycin is 85-94% susceptible, and Clindamycin is 80% susceptible.  Contact Microbiology within 7 days if sensitivity testing is  required.      Report Status PENDING  Incomplete  MRSA PCR Screening     Status: None   Collection Time: 02/27/16  3:35 AM  Result Value Ref Range Status   MRSA by PCR NEGATIVE NEGATIVE Final    Comment:        The GeneXpert MRSA Assay (FDA approved for NASAL specimens only), is one component of a comprehensive MRSA colonization surveillance program. It is not intended to diagnose MRSA infection nor to guide or monitor treatment for MRSA infections.   Urine culture     Status: None   Collection Time: 02/27/16  8:30 AM  Result Value Ref Range Status   Specimen Description URINE, RANDOM  Final   Special Requests NONE  Final   Culture NO GROWTH 1 DAY  Final   Report Status 02/28/2016 FINAL  Final    Studies/Results: Dg Abd 1 View  02/27/2016  CLINICAL DATA:  Nasogastric tube placement EXAM: ABDOMEN - 1 VIEW COMPARISON:  02/27/2016 at 17:08 FINDINGS: Nasogastric tube is incompletely imaged but it extends into the stomach and appears to curl up into the fundus although the tip is off the superior edge of the image. Visible abdominal gas pattern is grossly unremarkable. IMPRESSION: Nasogastric tube reaches the stomach, tip is probably in the fundus. Electronically Signed   By: Andreas Newport M.D.   On: 02/27/2016 22:02   Dg Abd 1 View  02/27/2016  CLINICAL DATA:  OG tube placement EXAM: ABDOMEN - 1 VIEW COMPARISON:  Chest x-ray same day FINDINGS: There is normal small bowel gas pattern. Surgical clips are noted in right and left upper abdomen. Prior vertebroplasty noted upper lumbar spine. The patient is status post median sternotomy. No NG tube is  identified. Partially visualized endotracheal tube with tip about 3 cm above the carina. Right IJ central line is unchanged in  position. IMPRESSION: The patient is status post median sternotomy. No NG tube is identified. Clinical correlation is necessary. Partially visualized endotracheal tube with tip about 3 cm above the carina. Right IJ central line is unchanged in position. I discussed with patient's nurse in ICU, Diane Electronically Signed   By: Lahoma Crocker M.D.   On: 02/27/2016 17:47   Dg Chest Port 1 View  02/27/2016  CLINICAL DATA:  Endotracheal tube placement, respiratory failure. EXAM: PORTABLE CHEST 1 VIEW COMPARISON:  Same day. FINDINGS: Stable cardiomediastinal silhouette. Status post coronary artery bypass graft. Endotracheal tube is seen in grossly good position projected over tracheal air shadow with distal tip 3 cm above the carina. Atherosclerosis of thoracic aorta is noted. Hypoinflation of the lungs is noted. Mild bibasilar subsegmental atelectasis is noted with associated left pleural effusion. No pneumothorax is noted. Right internal jugular catheter is unchanged in position. IMPRESSION: Endotracheal tube in grossly good position. Hypoinflation of the lungs is noted. Stable bibasilar subsegmental atelectasis with associated mild left pleural effusion. Electronically Signed   By: Marijo Conception, M.D.   On: 02/27/2016 16:23   Dg Chest Port 1 View  02/27/2016  CLINICAL DATA:  Central line placement.  Initial encounter. EXAM: PORTABLE CHEST 1 VIEW COMPARISON:  Chest radiograph performed earlier today at 3:52 a.m. FINDINGS: A right IJ line is noted ending about the mid to distal SVC. The lungs are hypoexpanded. Bibasilar airspace opacities may reflect atelectasis or possibly pneumonia. Mild vascular crowding is noted. Small bilateral pleural effusions are suspected. No pneumothorax is seen. The cardiomediastinal silhouette is mildly enlarged. The patient is status post median sternotomy, with evidence of prior CABG. No acute osseous abnormalities are identified. Scattered clips are noted about the upper abdomen.  IMPRESSION: 1. Right IJ line noted ending about the mid to distal SVC. 2. Lungs hypoexpanded. Bibasilar airspace opacities may reflect atelectasis or possibly pneumonia. 3. Suspect small bilateral pleural effusions.  Mild cardiomegaly. Electronically Signed   By: Garald Balding M.D.   On: 02/27/2016 05:57   Dg Chest Port 1 View  02/27/2016  CLINICAL DATA:  Acute onset of respiratory failure. Initial encounter. EXAM: PORTABLE CHEST 1 VIEW COMPARISON:  Chest radiograph performed 03/23/2016 FINDINGS: The lungs are hypoexpanded. Bibasilar airspace opacities may reflect atelectasis or pneumonia. No definite pleural effusion or pneumothorax is seen. The cardiomediastinal silhouette is borderline normal in size. The patient is status post median sternotomy, with evidence of prior CABG. Scattered clips are noted at the upper abdomen. No acute osseous abnormalities are identified. IMPRESSION: Lungs hypoexpanded. Bibasilar airspace opacities may reflect atelectasis or pneumonia. Electronically Signed   By: Garald Balding M.D.   On: 02/27/2016 04:04    Assessment/Plan: John Schultz is a 75 y.o. male with Group A strep septic knee (Synovial WBC > 300K), sepsis, hypotensive, intubated, no obvious sourse with no evidence cellulitis and no PNA on cxr.   Recommendations Continue  pcn and clinda Check echo Cntinue supportive care Discussed with wife.  Thank you very much for the consult. Will follow with you.  Kayela Humphres P   02/28/2016, 2:23 PM

## 2016-02-29 ENCOUNTER — Inpatient Hospital Stay: Admit: 2016-02-29 | Payer: Commercial Managed Care - HMO

## 2016-02-29 DIAGNOSIS — B950 Streptococcus, group A, as the cause of diseases classified elsewhere: Secondary | ICD-10-CM

## 2016-02-29 LAB — BODY FLUID CULTURE

## 2016-02-29 LAB — CULTURE, BLOOD (ROUTINE X 2)

## 2016-02-29 LAB — GLUCOSE, CAPILLARY
GLUCOSE-CAPILLARY: 140 mg/dL — AB (ref 65–99)
GLUCOSE-CAPILLARY: 142 mg/dL — AB (ref 65–99)
GLUCOSE-CAPILLARY: 149 mg/dL — AB (ref 65–99)
GLUCOSE-CAPILLARY: 150 mg/dL — AB (ref 65–99)
GLUCOSE-CAPILLARY: 155 mg/dL — AB (ref 65–99)
GLUCOSE-CAPILLARY: 156 mg/dL — AB (ref 65–99)
GLUCOSE-CAPILLARY: 159 mg/dL — AB (ref 65–99)
GLUCOSE-CAPILLARY: 163 mg/dL — AB (ref 65–99)
GLUCOSE-CAPILLARY: 173 mg/dL — AB (ref 65–99)
GLUCOSE-CAPILLARY: 174 mg/dL — AB (ref 65–99)
GLUCOSE-CAPILLARY: 192 mg/dL — AB (ref 65–99)
Glucose-Capillary: 142 mg/dL — ABNORMAL HIGH (ref 65–99)
Glucose-Capillary: 192 mg/dL — ABNORMAL HIGH (ref 65–99)
Glucose-Capillary: 165 mg/dL — ABNORMAL HIGH (ref 65–99)
Glucose-Capillary: 177 mg/dL — ABNORMAL HIGH (ref 65–99)
Glucose-Capillary: 181 mg/dL — ABNORMAL HIGH (ref 65–99)
Glucose-Capillary: 160 mg/dL — ABNORMAL HIGH (ref 65–99)
Glucose-Capillary: 128 mg/dL — ABNORMAL HIGH (ref 65–99)
Glucose-Capillary: 162 mg/dL — ABNORMAL HIGH (ref 65–99)
Glucose-Capillary: 144 mg/dL — ABNORMAL HIGH (ref 65–99)
Glucose-Capillary: 140 mg/dL — ABNORMAL HIGH (ref 65–99)
Glucose-Capillary: 134 mg/dL — ABNORMAL HIGH (ref 65–99)

## 2016-02-29 LAB — BASIC METABOLIC PANEL
ANION GAP: 7 (ref 5–15)
BUN: 65 mg/dL — ABNORMAL HIGH (ref 6–20)
CHLORIDE: 105 mmol/L (ref 101–111)
CO2: 20 mmol/L — ABNORMAL LOW (ref 22–32)
CREATININE: 1.91 mg/dL — AB (ref 0.61–1.24)
Calcium: 6.2 mg/dL — CL (ref 8.9–10.3)
GFR calc non Af Amer: 33 mL/min — ABNORMAL LOW (ref >60)
GFR, EST AFRICAN AMERICAN: 38 mL/min — AB (ref >60)
Glucose, Bld: 173 mg/dL — ABNORMAL HIGH (ref 65–99)
Potassium: 3.5 mmol/L (ref 3.5–5.1)
SODIUM: 132 mmol/L — AB (ref 135–145)

## 2016-02-29 LAB — PROCALCITONIN: Procalcitonin: 16.65 ng/mL

## 2016-02-29 LAB — CBC
HEMATOCRIT: 27.4 % — AB (ref 40.0–52.0)
HEMOGLOBIN: 9.2 g/dL — AB (ref 13.0–18.0)
MCH: 30.4 pg (ref 26.0–34.0)
MCHC: 33.7 g/dL (ref 32.0–36.0)
MCV: 90.3 fL (ref 80.0–100.0)
PLATELETS: 177 10*3/uL (ref 150–440)
RBC: 3.04 MIL/uL — AB (ref 4.40–5.90)
RDW: 14.2 % (ref 11.5–14.5)
WBC: 18.3 10*3/uL — ABNORMAL HIGH (ref 3.8–10.6)

## 2016-02-29 LAB — HEPARIN LEVEL (UNFRACTIONATED): Heparin Unfractionated: 0.3 IU/mL (ref 0.30–0.70)

## 2016-02-29 LAB — ALBUMIN: ALBUMIN: 1.5 g/dL — AB (ref 3.5–5.0)

## 2016-02-29 MED ORDER — ANTISEPTIC ORAL RINSE SOLUTION (CORINZ)
7.0000 mL | Freq: Four times a day (QID) | OROMUCOSAL | Status: DC
  Administered 2016-02-29 – 2016-03-08 (×33): 7 mL via OROMUCOSAL
  Filled 2016-02-29 (×37): qty 7

## 2016-02-29 MED ORDER — PRO-STAT SUGAR FREE PO LIQD
30.0000 mL | Freq: Two times a day (BID) | ORAL | Status: DC
  Administered 2016-02-29 – 2016-03-05 (×11): 30 mL

## 2016-02-29 MED ORDER — VITAL AF 1.2 CAL PO LIQD
1000.0000 mL | ORAL | Status: DC
  Administered 2016-02-29 – 2016-03-07 (×9): 1000 mL

## 2016-02-29 MED ORDER — CHLORHEXIDINE GLUCONATE 0.12% ORAL RINSE (MEDLINE KIT)
15.0000 mL | Freq: Two times a day (BID) | OROMUCOSAL | Status: DC
  Administered 2016-02-29 – 2016-03-07 (×16): 15 mL via OROMUCOSAL
  Filled 2016-02-29 (×19): qty 15

## 2016-02-29 NOTE — Progress Notes (Signed)
[facility] INFECTIOUS DISEASE PROGRESS NOTE Date of Admission:  03/23/2016     ID: John Schultz is a 75 y.o. male with  Grp A strep septic R knee and bacteremia  Principal Problem:   Septic arthritis of knee, right (HCC) Active Problems:   ARF (acute renal failure) (HCC)   Acute bronchitis   Hyponatremia   NSTEMI (non-ST elevated myocardial infarction) (HCC)   Pressure ulcer   Septic shock (HCC)   Acute respiratory failure (HCC)   Subjective: Remains intubated, decreasing on pressor, sedated. No fever since 4/3   ROS  intubated  Medications:  Antibiotics Given (last 72 hours)    Date/Time Action Medication Dose Rate   02/27/16 0442 Given   vancomycin (VANCOCIN) 1,250 mg in sodium chloride 0.9 % 250 mL IVPB 1,250 mg 166.7 mL/hr   02/27/16 1742 Given   clindamycin (CLEOCIN) IVPB 900 mg 900 mg 100 mL/hr   02/27/16 1848 Given   penicillin G potassium 4 Million Units in dextrose 5 % 250 mL IVPB 4 Million Units 250 mL/hr   02/27/16 2113 Given   penicillin G potassium 4 Million Units in dextrose 5 % 250 mL IVPB 4 Million Units 250 mL/hr   02/27/16 2228 Given   clindamycin (CLEOCIN) IVPB 900 mg 900 mg 100 mL/hr   02/28/16 0021 Given   penicillin G potassium 4 Million Units in dextrose 5 % 250 mL IVPB 4 Million Units 250 mL/hr   02/28/16 0424 Given   penicillin G potassium 4 Million Units in dextrose 5 % 250 mL IVPB 4 Million Units 250 mL/hr   02/28/16 0520 Given   clindamycin (CLEOCIN) IVPB 900 mg 900 mg 100 mL/hr   02/28/16 1026 Given   penicillin G potassium 4 Million Units in dextrose 5 % 250 mL IVPB 4 Million Units 250 mL/hr   02/28/16 1303 Given   clindamycin (CLEOCIN) IVPB 900 mg 900 mg 100 mL/hr   02/28/16 1824 Given   penicillin G potassium 4 Million Units in dextrose 5 % 250 mL IVPB 4 Million Units 250 mL/hr   02/28/16 2140 Given   clindamycin (CLEOCIN) IVPB 900 mg 900 mg 100 mL/hr   02/29/16 0033 Given   penicillin G potassium 4 Million Units in dextrose 5  % 250 mL IVPB 4 Million Units 250 mL/hr   02/29/16 0519 Given   clindamycin (CLEOCIN) IVPB 900 mg 900 mg 100 mL/hr   02/29/16 0610 Given   penicillin G potassium 4 Million Units in dextrose 5 % 250 mL IVPB 4 Million Units 250 mL/hr   02/29/16 1140 Given   penicillin G potassium 4 Million Units in dextrose 5 % 250 mL IVPB 4 Million Units 250 mL/hr     . antiseptic oral rinse  7 mL Mouth Rinse QID  . aspirin  81 mg Per Tube Daily  . budesonide (PULMICORT) nebulizer solution  0.5 mg Nebulization BID  . chlorhexidine gluconate (SAGE KIT)  15 mL Mouth Rinse BID  . clindamycin (CLEOCIN) IV  900 mg Intravenous 3 times per day  . docusate  100 mg Per Tube BID  . free water  100 mL Per Tube 3 times per day  . gabapentin  300 mg Per Tube QHS  . ipratropium-albuterol  3 mL Nebulization QID  . pantoprazole (PROTONIX) IV  40 mg Intravenous Q24H  . pencillin G potassium IV  4 Million Units Intravenous 4 times per day  . potassium chloride  40 mEq Per Tube Once  . pravastatin  40 mg Oral q1800  . sodium chloride flush  3 mL Intravenous Q12H    Objective: Vital signs in last 24 hours: Temp:  [98.1 F (36.7 C)-99.9 F (37.7 C)] 98.9 F (37.2 C) (04/05 1200) Pulse Rate:  [78-94] 89 (04/05 1200) Resp:  [22-29] 25 (04/05 1200) BP: (94-122)/(47-106) 122/106 mmHg (04/05 1200) SpO2:  [94 %-100 %] 100 % (04/05 1200) FiO2 (%):  [30 %] 30 % (04/05 1200) Weight:  [85 kg (187 lb 6.3 oz)] 85 kg (187 lb 6.3 oz) (04/05 0500) Constitutional: intubated, critically ill appearing.  HENT: perrla, eomi Mouth/Throat: Oropharynx - etet in place  Cardiovascular: tachy, reg Pulmonary/Chest: mech bs Abdominal: Soft. Bowel sounds are normal. He exhibits no distension. There is no tenderness.  Lymphadenopathy: He has no cervical adenopathy.  Neurological: intubated, sedated  Skin: Skin is warm and dry. No rash noted. No erythema.  Psychiatric: sedated Ext - L knee wrapped post op  Lab Results  Recent  Labs  02/28/16 0431 02/29/16 0513 02/29/16 1137  WBC 15.2* 18.3*  --   HGB 9.7* 9.2*  --   HCT 29.1* 27.4*  --   NA 135  --  132*  K 3.1*  --  3.5  CL 107  --  105  CO2 19*  --  20*  BUN 73*  --  65*  CREATININE 2.19*  --  1.91*    Microbiology: Results for orders placed or performed during the hospital encounter of 03/18/2016  Blood culture (routine x 2)     Status: None (Preliminary result)   Collection Time: 03/25/2016 12:59 PM  Result Value Ref Range Status   Specimen Description BLOOD LEFT FOREARM  Final   Special Requests BOTTLES DRAWN AEROBIC AND ANAEROBIC  1CC  Final   Culture  Setup Time   Final    GRAM POSITIVE COCCI AEROBIC BOTTLE ONLY CRITICAL VALUE NOTED.  VALUE IS CONSISTENT WITH PREVIOUSLY REPORTED AND CALLED VALUE.    Culture STREPTOCOCCUS PYOGENES AEROBIC BOTTLE ONLY   Final   Report Status PENDING  Incomplete   Organism ID, Bacteria STREPTOCOCCUS PYOGENES  Final      Susceptibility   Streptococcus pyogenes - MIC*    CLINDAMYCIN Value in next row Sensitive      SENSITIVE0.25    AMPICILLIN Value in next row Sensitive      SENSITIVE0.25    ERYTHROMYCIN Value in next row Sensitive      SENSITIVE0.12    VANCOMYCIN Value in next row Sensitive      SENSITIVE0.5    CEFTRIAXONE Value in next row Sensitive      SENSITIVE0.12    LEVOFLOXACIN Value in next row Sensitive      SENSITIVE0.5    * STREPTOCOCCUS PYOGENES  Rapid Influenza A&B Antigens ([facility] only)     Status: None   Collection Time: 03/17/2016  1:00 PM  Result Value Ref Range Status   Influenza A ([facility]) NEGATIVE NEGATIVE Final   Influenza B ([facility]) NEGATIVE NEGATIVE Final  Blood culture (routine x 2)     Status: None   Collection Time: 02/27/2016  1:40 PM  Result Value Ref Range Status   Specimen Description BLOOD LEFT FOREARM  Final   Special Requests BOTTLES DRAWN AEROBIC AND ANAEROBIC  4CC  Final   Culture  Setup Time   Final    GRAM POSITIVE COCCI IN BOTH AEROBIC AND ANAEROBIC BOTTLES CRITICAL  RESULT CALLED TO, READ BACK BY AND VERIFIED WITH: NATE COOKSON AT 8309 02/27/16 CAF  Culture   Final    STREPTOCOCCUS PYOGENES IN BOTH AEROBIC AND ANAEROBIC BOTTLES    Report Status 02/29/2016 FINAL  Final   Organism ID, Bacteria STREPTOCOCCUS PYOGENES  Final      Susceptibility   Streptococcus pyogenes - MIC*    CLINDAMYCIN Value in next row Sensitive      SENSITIVE<=0.25    AMPICILLIN Value in next row Sensitive      SENSITIVE<=0.25    ERYTHROMYCIN Value in next row Sensitive      SENSITIVE0.12    VANCOMYCIN Value in next row Sensitive      SENSITIVE0.5    CEFTRIAXONE Value in next row Sensitive      SENSITIVE0.12    LEVOFLOXACIN Value in next row Sensitive      SENSITIVE0.5    * STREPTOCOCCUS PYOGENES  Blood Culture ID Panel (Reflexed)     Status: Abnormal   Collection Time: 03/11/2016  1:40 PM  Result Value Ref Range Status   Enterococcus species NOT DETECTED NOT DETECTED Final   Vancomycin resistance NOT DETECTED NOT DETECTED Final   Listeria monocytogenes NOT DETECTED NOT DETECTED Final   Staphylococcus species NOT DETECTED NOT DETECTED Final   Staphylococcus aureus NOT DETECTED NOT DETECTED Final   Methicillin resistance NOT DETECTED NOT DETECTED Final   Streptococcus species NOT DETECTED NOT DETECTED Final   Streptococcus agalactiae NOT DETECTED NOT DETECTED Final   Streptococcus pneumoniae NOT DETECTED NOT DETECTED Final   Streptococcus pyogenes DETECTED (A) NOT DETECTED Final    Comment: CRITICAL RESULT CALLED TO, READ BACK BY AND VERIFIED WITH: NATE COOKSON AT 0426 02/27/16 CAF    Acinetobacter baumannii NOT DETECTED NOT DETECTED Final   Enterobacteriaceae species NOT DETECTED NOT DETECTED Final   Enterobacter cloacae complex NOT DETECTED NOT DETECTED Final   Escherichia coli NOT DETECTED NOT DETECTED Final   Klebsiella oxytoca NOT DETECTED NOT DETECTED Final   Klebsiella pneumoniae NOT DETECTED NOT DETECTED Final   Proteus species NOT DETECTED NOT DETECTED Final    Serratia marcescens NOT DETECTED NOT DETECTED Final   Carbapenem resistance NOT DETECTED NOT DETECTED Final   Haemophilus influenzae NOT DETECTED NOT DETECTED Final   Neisseria meningitidis NOT DETECTED NOT DETECTED Final   Pseudomonas aeruginosa NOT DETECTED NOT DETECTED Final   Candida albicans NOT DETECTED NOT DETECTED Final   Candida glabrata NOT DETECTED NOT DETECTED Final   Candida krusei NOT DETECTED NOT DETECTED Final   Candida parapsilosis NOT DETECTED NOT DETECTED Final   Candida tropicalis NOT DETECTED NOT DETECTED Final  Body fluid culture     Status: None   Collection Time: 03/04/2016  3:05 PM  Result Value Ref Range Status   Specimen Description R Knee  Final   Special Requests NONE  Final   Gram Stain   Final    MODERATE WBC SEEN MODERATE GRAM POSITIVE COCCI IN PAIRS AND CHAINS    Culture   Final    HEAVY GROWTH GROUP A STREP (S.PYOGENES) ISOLATED There is no known Penicillin Resistant Beta Streptococcus in the [place] For patients that are Penicillin-allergic, Erythromycin is 85-94% susceptible, and Clindamycin is 80% susceptible.  Contact Microbiology within 7 days if sensitivity testing is  required.   NO ANAEROBES ISOLATED    Report Status 02/29/2016 FINAL  Final  Body fluid culture     Status: None   Collection Time: 02/27/2016  4:10 PM  Result Value Ref Range Status   Specimen Description R Knee  Final   Special Requests NONE  Final  Gram Stain   Final    MANY WBC SEEN MANY GRAM POSITIVE COCCI IN PAIRS AND CHAINS    Culture   Final    HEAVY GROWTH GROUP A STREP (S.PYOGENES) ISOLATED There is no known Penicillin Resistant Beta Streptococcus in the [place] For patients that are Penicillin-allergic, Erythromycin is 85-94% susceptible, and Clindamycin is 80% susceptible.  Contact Microbiology within 7 days if sensitivity testing is  required.   NO ANAEROBES ISOLATED    Report Status 02/29/2016 FINAL  Final  MRSA PCR Screening     Status: None   Collection Time:  02/27/16  3:35 AM  Result Value Ref Range Status   MRSA by PCR NEGATIVE NEGATIVE Final    Comment:        The GeneXpert MRSA Assay (FDA approved for NASAL specimens only), is one component of a comprehensive MRSA colonization surveillance program. It is not intended to diagnose MRSA infection nor to guide or monitor treatment for MRSA infections.   Urine culture     Status: None   Collection Time: 02/27/16  8:30 AM  Result Value Ref Range Status   Specimen Description URINE, RANDOM  Final   Special Requests NONE  Final   Culture NO GROWTH 1 DAY  Final   Report Status 02/28/2016 FINAL  Final    Studies/Results: Dg Abd 1 View  02/27/2016  CLINICAL DATA:  Nasogastric tube placement EXAM: ABDOMEN - 1 VIEW COMPARISON:  02/27/2016 at 17:08 FINDINGS: Nasogastric tube is incompletely imaged but it extends into the stomach and appears to curl up into the fundus although the tip is off the superior edge of the image. Visible abdominal gas pattern is grossly unremarkable. IMPRESSION: Nasogastric tube reaches the stomach, tip is probably in the fundus. Electronically Signed   By: Andreas Newport M.D.   On: 02/27/2016 22:02   Dg Abd 1 View  02/27/2016  CLINICAL DATA:  OG tube placement EXAM: ABDOMEN - 1 VIEW COMPARISON:  Chest x-ray same day FINDINGS: There is normal small bowel gas pattern. Surgical clips are noted in right and left upper abdomen. Prior vertebroplasty noted upper lumbar spine. The patient is status post median sternotomy. No NG tube is identified. Partially visualized endotracheal tube with tip about 3 cm above the carina. Right IJ central line is unchanged in position. IMPRESSION: The patient is status post median sternotomy. No NG tube is identified. Clinical correlation is necessary. Partially visualized endotracheal tube with tip about 3 cm above the carina. Right IJ central line is unchanged in position. I discussed with patient's nurse in ICU, Diane Electronically Signed   By:  Lahoma Crocker M.D.   On: 02/27/2016 17:47   Dg Chest Port 1 View  02/27/2016  CLINICAL DATA:  Endotracheal tube placement, respiratory failure. EXAM: PORTABLE CHEST 1 VIEW COMPARISON:  Same day. FINDINGS: Stable cardiomediastinal silhouette. Status post coronary artery bypass graft. Endotracheal tube is seen in grossly good position projected over tracheal air shadow with distal tip 3 cm above the carina. Atherosclerosis of thoracic aorta is noted. Hypoinflation of the lungs is noted. Mild bibasilar subsegmental atelectasis is noted with associated left pleural effusion. No pneumothorax is noted. Right internal jugular catheter is unchanged in position. IMPRESSION: Endotracheal tube in grossly good position. Hypoinflation of the lungs is noted. Stable bibasilar subsegmental atelectasis with associated mild left pleural effusion. Electronically Signed   By: Marijo Conception, M.D.   On: 02/27/2016 16:23    Assessment/Plan: John Schultz is a 75 y.o. male  with Group A strep septic knee (Synovial WBC > 300K), sepsis, hypotensive, intubated, no obvious sourse with no evidence cellulitis and no PNA on cxr.  Echo neg for vegetation  Recommendations Continue  pcn and clinda Continue supportive care Discussed with wife.  Thank you very much for the consult. Will follow with you.  Sumaiyah Markert P   02/29/2016, 1:20 PM

## 2016-02-29 NOTE — Progress Notes (Signed)
Notified Dr. Mortimer Fries of critical Ca 6.2 given orders to obtain albumin level than correct the Calcium value based on the results if Calcium is low notify pharmacy to manage Calcium level

## 2016-02-29 NOTE — Progress Notes (Signed)
Patient has been resting well on propofol and fentanyl throughout the night.  Urine output has been adequate and vital signs have been stable.  Patient is still on the insulin gtt due to ICU glycemic protocol (tube feeds are not yet at goal), CBG's have been 140's-160's.  Will continue to monitor.

## 2016-02-29 NOTE — Care Management (Signed)
Barrier to discharge- intubated and vented - attempt spontaneous trial, continuing to wean pressures.  Off sedation

## 2016-02-29 NOTE — Progress Notes (Signed)
Pt in stable condition at this time. Report given to [facility]. Pt remains sedated on vent

## 2016-02-29 NOTE — Progress Notes (Addendum)
ANTICOAGULATION CONSULT NOTE - Initial Consult  Pharmacy Consult for heparin Indication: chest pain/ACS  Allergies  Allergen Reactions  . Tape Rash    Patient Measurements: Height: 6' (182.9 cm) Weight: 186 lb 11.7 oz (84.7 kg) IBW/kg (Calculated) : 77.6 Heparin Dosing Weight: 81.6 kg  Vital Signs: Temp: 98.6 F (37 C) (04/04 2000) Temp Source: Oral (04/04 2000) BP: 106/58 mmHg (04/05 0000) Pulse Rate: 82 (04/05 0000)  Labs:  Recent Labs  02/25/2016 1914 02/27/16 0055 02/27/16 0331 02/27/16 0339 02/27/16 0832  02/27/16 2344 02/28/16 0431 02/28/16 0953 02/28/16 2220  HGB  --  11.1* 10.9*  --   --   --   --  9.7*  --   --   HCT  --  33.2* 33.0*  --   --   --   --  29.1*  --   --   PLT  --  174 158  --   --   --   --  184  --   --   APTT  --   --   --  34  --   --   --   --   --   --   LABPROT  --   --   --  17.3*  --   --   --   --   --   --   INR  --   --   --  1.40  --   --   --   --   --   --   HEPARINUNFRC  --   --   --   --   --   < > 0.20*  --  0.29* 0.31  CREATININE  --  2.50* 2.59*  --   --   --   --  2.19*  --   --   TROPONINI 2.90* 7.28*  --   --  8.10*  --   --   --   --   --   < > = values in this interval not displayed.  Estimated Creatinine Clearance: 33 mL/min (by C-G formula based on Cr of 2.19).   Medical History: Past Medical History  Diagnosis Date  . CHF (congestive heart failure) ([place])   . DM (diabetes mellitus) ([place])   . Pancreatic cyst   . Skin cancer   . DDD (degenerative disc disease), thoracic   . Renal failure   . Respiratory failure (HCC)     Medications:  Infusions:  . feeding supplement (VITAL AF 1.2 CAL) 1,000 mL (02/28/16 2000)  . fentaNYL infusion INTRAVENOUS 50 mcg/hr (02/28/16 2000)  . heparin 1,550 Units/hr (02/28/16 2000)  . insulin (NOVOLIN-R) infusion 2.2 Units/hr (02/28/16 2354)  . norepinephrine 15 mcg/min (02/28/16 2259)  . propofol (DIPRIVAN) infusion 20 mcg/kg/min (02/28/16 2000)    Assessment: 89 yom  with rising troponin, pharmacy consulted to dose heparin for ACS.  Goal of Therapy:  Heparin level 0.3-0.7 units/ml Monitor platelets by anticoagulation protocol: Yes   Plan:  Heparin level is slightly below goal so will bolus heparin 1300 units iv once and increase infusion to 1550 units/hr. Will recheck a HL in 8 hours.   4/4 PM heparin level 0.31. Recheck with AM labs to confirm.  4/5 AM heparin level 0.30. Continue current regimen. Recheck with CBC tomorrow AM.   Sylvan Lahm S, Pharm.D., BCPS Clinical Pharmacist 02/29/2016,12:48 AM

## 2016-02-29 NOTE — Progress Notes (Signed)
Nutrition Follow-up    INTERVENTION:   EN: with current diprivan, recommend modifying goal rate to 55 ml/hr with addition of Prostat BID. Continue to assess  NUTRITION DIAGNOSIS:   Inadequate oral intake related to acute illness as evidenced by NPO status.  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, respiratory distress with severe acidosis requiring intubation on 02/27/16  Patient is currently intubated on ventilator support, levophed at 13 mcg/min MV: 13 L/min Temp (24hrs), Avg:98.9 F (37.2 C), Min:98.1 F (36.7 C), Max:100 F (37.8 C)  Propofol: 10.2 ml/hr (270 kcals in 24 hours)  Diet Order:  Diet NPO time specified Except for: Sips with Meds   EN: tolerating Vital AF 1.2 at rate of 40 ml/hr, current goal rate of 70 ml/hr  Skin:   (stage I pressure ulcer on buttock)  Last BM:  03/24/2016    Recent Labs Lab 02/27/16 0331 02/28/16 0431 02/29/16 1137  NA 134* 135 132*  K 3.9 3.1* 3.5  CL 100* 107 105  CO2 22 19* 20*  BUN 78* 73* 65*  CREATININE 2.59* 2.19* 1.91*  CALCIUM 7.7* 6.4* 6.2*  MG 1.8  --   --   GLUCOSE 351* 221* 173*    Meds: insulin drip, levophed, diprivan  Height:   Ht Readings from Last 1 Encounters:  02/28/16 6' (1.829 m)    Weight:   Wt Readings from Last 1 Encounters:  02/29/16 187 lb 6.3 oz (85 kg)    Filed Weights   02/27/16 0330 02/28/16 0415 02/29/16 0500  Weight: 178 lb 12.7 oz (81.1 kg) 186 lb 11.7 oz (84.7 kg) 187 lb 6.3 oz (85 kg)    BMI:  Body mass index is 25.41 kg/(m^2).  Estimated Nutritional Needs:   Kcal:  2085 kcals (BEE 1625, Ve: 11, Tmax: 38.3) using wt of 84.7 kg  Protein:  128-170 g (1.5-2.0 g/kg)   Fluid:  >2 Liters  EDUCATION NEEDS:   Education needs no appropriate  at this time  [place], [place], [place] [phone] Pager  [phone] Weekend/On-Call Pager

## 2016-02-29 NOTE — Progress Notes (Signed)
[facility] Consultation      Date: 02/29/2016,   MRN# [id number] KOLTEN RYBACK [birth year]    AdmissionWeight: 180 lb (81.647 kg)                 CurrentWeight: 187 lb 6.3 oz (85 kg)   CHIEF COMPLAINT:   Follow up resp failure   HISTORY OF PRESENT ILLNESS   Remains intubated,sedated On vasopressors  Blood  Cultures and wound cultures Group A strep +POS     MEDICATIONS    Home Medication:  No current outpatient prescriptions on file.  Current Medication:  Current facility-administered medications:  .  acetaminophen (TYLENOL) tablet 650 mg, 650 mg, Oral, Q6H PRN **OR** acetaminophen (TYLENOL) suppository 650 mg, 650 mg, Rectal, Q6H PRN, Idelle Crouch, MD .  antiseptic oral rinse solution (CORINZ), 7 mL, Mouth Rinse, QID, [name], MD, 7 mL at 02/29/16 0450 .  aspirin chewable tablet 81 mg, 81 mg, Per Tube, Daily, [name], MD, 81 mg at 02/28/16 1027 .  bisacodyl (DULCOLAX) suppository 10 mg, 10 mg, Rectal, Daily PRN, Idelle Crouch, MD .  budesonide (PULMICORT) nebulizer solution 0.5 mg, 0.5 mg, Nebulization, BID, [name], MD, 0.5 mg at 02/29/16 0751 .  chlorhexidine gluconate (SAGE KIT) (PERIDEX) 0.12 % solution 15 mL, 15 mL, Mouth Rinse, BID, [name], MD .  clindamycin (CLEOCIN) IVPB 900 mg, 900 mg, Intravenous, 3 times per day, Leonel Ramsay, MD, 900 mg at 02/29/16 0519 .  docusate (COLACE) 50 MG/5ML liquid 100 mg, 100 mg, Per Tube, BID, [name], MD, 100 mg at 02/28/16 1027 .  feeding supplement (VITAL AF 1.2 CAL) liquid 1,000 mL, 1,000 mL, Per Tube, Continuous, [name], MD, Last Rate: 40 mL/hr at 02/29/16 0800, 1,000 mL at 02/29/16 0800 .  fentaNYL 258mg in NS 2528m(1032mml) infusion-PREMIX, 40 mcg/hr, Intravenous, Continuous, KurFlora LippsD, Last Rate: 5 mL/hr at 02/29/16 0800, 50 mcg/hr at 02/29/16 0800 .  free water 100 mL, 100 mL, Per Tube, 3 times per day, KurFlora LippsD, 100 mL at 02/29/16 0600 .   gabapentin (NEURONTIN) tablet 300 mg, 300 mg, Per Tube, QHS, KurFlora LippsD, 300 mg at 02/28/16 2140 .  haloperidol lactate (HALDOL) injection 1 mg, 1 mg, Intravenous, Q6H PRN, RimTheodoro GristD .  heparin ADULT infusion 100 units/mL (25000 units/250 mL), 1,550 Units/hr, Intravenous, Continuous, KurFlora LippsD, Last Rate: 15.5 mL/hr at 02/29/16 0800, 1,550 Units/hr at 02/29/16 0800 .  insulin regular (NOVOLIN R,HUMULIN R) 250 Units in sodium chloride 0.9 % 250 mL (1 Units/mL) infusion, , Intravenous, Continuous, KurFlora LippsD, Last Rate: 2.3 mL/hr at 02/29/16 0823, 2.3 Units/hr at 02/29/16 0823 .  ipratropium-albuterol (DUONEB) 0.5-2.5 (3) MG/3ML nebulizer solution 3 mL, 3 mL, Nebulization, QID, JefIdelle CrouchD, 3 mL at 02/29/16 0751 .  morphine 2 MG/ML injection 2 mg, 2 mg, Intravenous, Q2H PRN, JefIdelle CrouchD, 2 mg at 02/27/16 0041 .  norepinephrine (LEVOPHED) 4mg68m D5W 250mL24mmix infusion, 0-40 mcg/min, Intravenous, Titrated, VaibhVaughan Basta Last Rate: 56.3 mL/hr at 02/29/16 0800, 15.013 mcg/min at 02/29/16 0800 .  ondansetron (ZOFRAN) tablet 4 mg, 4 mg, Oral, Q6H PRN **OR** ondansetron (ZOFRAN) injection 4 mg, 4 mg, Intravenous, Q6H PRN, JeffrIdelle Crouch.  pantoprazole (PROTONIX) injection 40 mg, 40 mg, Intravenous, Q24H, Magadalene S Tukov, NP, 40 mg at 02/28/16 1027 .  penicillin G potassium 4 Million Units in dextrose 5 % 250 mL IVPB, 4 Million  Units, Intravenous, 4 times per day, [name], MD, 4 Million Units at 02/29/16 0610 .  potassium chloride 20 MEQ/15ML (10%) solution 40 mEq, 40 mEq, Per Tube, Once, Raylene Miyamoto, MD .  pravastatin (PRAVACHOL) tablet 40 mg, 40 mg, Oral, q1800, Idelle Crouch, MD, 40 mg at 02/28/16 1815 .  propofol (DIPRIVAN) 1000 MG/100ML infusion, 0-50 mcg/kg/min, Intravenous, Continuous, [name], MD, Last Rate: 10.2 mL/hr at 02/29/16 0800, 20.071 mcg/kg/min at 02/29/16 0800 .  sodium chloride flush (NS) 0.9 % injection  10-40 mL, 10-40 mL, Intracatheter, PRN, [name], MD .  sodium chloride flush (NS) 0.9 % injection 3 mL, 3 mL, Intravenous, Q12H, Idelle Crouch, MD, 3 mL at 02/28/16 2200    ALLERGIES   Tape     REVIEW OF SYSTEMS   Review of Systems  Unable to perform ROS: critical illness     VS: BP 105/55 mmHg  Pulse 79  Temp(Src) 98.8 F (37.1 C) (Oral)  Resp 22  Ht 6' (1.829 m)  Wt 187 lb 6.3 oz (85 kg)  BMI 25.41 kg/m2  SpO2 97%     PHYSICAL EXAM  Physical Exam  Constitutional: No distress.  HENT:  Head: Normocephalic and atraumatic.  Eyes: Pupils are equal, round, and reactive to light. No scleral icterus.  Neck: Normal range of motion. Neck supple.  Cardiovascular: Normal rate and regular rhythm.   No murmur heard. Pulmonary/Chest: No respiratory distress. He has no wheezes. He has rales.  resp distress  Abdominal: Soft. He exhibits no distension. There is no tenderness.  Musculoskeletal: He exhibits edema.  RT knee with wound dressing  Neurological: He displays normal reflexes. Coordination normal.  gcs<8T  Skin: Skin is warm. No rash noted. He is diaphoretic.        LABS    Recent Labs     03/09/2016  1339  02/27/16  0055  02/27/16  0331  02/27/16  0339  02/28/16  0431  02/29/16  0513  HGB  11.8*  11.1*  10.9*   --   9.7*  9.2*  HCT  35.5*  33.2*  33.0*   --   29.1*  27.4*  MCV  91.6  93.0  92.2   --   93.4  90.3  WBC  16.8*  12.7*  11.2*   --   15.2*  18.3*  BUN  79*  76*  78*   --   73*   --   CREATININE  2.73*  2.50*  2.59*   --   2.19*   --   GLUCOSE  319*  374*  351*   --   221*   --   CALCIUM  8.6*  7.6*  7.7*   --   6.4*   --   INR   --    --    --   1.40   --    --   ,     CULTURE RESULTS   Recent Results (from the past 240 hour(s))  Blood culture (routine x 2)     Status: None (Preliminary result)   Collection Time: 03/02/2016 12:59 PM  Result Value Ref Range Status   Specimen Description BLOOD LEFT FOREARM  Final   Special  Requests BOTTLES DRAWN AEROBIC AND ANAEROBIC  1CC  Final   Culture  Setup Time   Final    GRAM POSITIVE COCCI AEROBIC BOTTLE ONLY CRITICAL VALUE NOTED.  VALUE IS CONSISTENT WITH PREVIOUSLY REPORTED AND CALLED VALUE.  Culture   Final    GROUP A STREP (S.PYOGENES) ISOLATED AEROBIC BOTTLE ONLY SUSCEPTIBILITIES TO FOLLOW    Report Status PENDING  Incomplete  Rapid Influenza A&B Antigens ([place] only)     Status: None   Collection Time: 03/15/2016  1:00 PM  Result Value Ref Range Status   Influenza A ([place]) NEGATIVE NEGATIVE Final   Influenza B ([facility]) NEGATIVE NEGATIVE Final  Blood culture (routine x 2)     Status: None   Collection Time: 03/06/2016  1:40 PM  Result Value Ref Range Status   Specimen Description BLOOD LEFT FOREARM  Final   Special Requests BOTTLES DRAWN AEROBIC AND ANAEROBIC  4CC  Final   Culture  Setup Time   Final    GRAM POSITIVE COCCI IN BOTH AEROBIC AND ANAEROBIC BOTTLES CRITICAL RESULT CALLED TO, READ BACK BY AND VERIFIED WITH: [place] AT 6122 02/27/16 CAF    Culture   Final    STREPTOCOCCUS PYOGENES IN BOTH AEROBIC AND ANAEROBIC BOTTLES    Report Status 02/29/2016 FINAL  Final   Organism ID, Bacteria STREPTOCOCCUS PYOGENES  Final      Susceptibility   Streptococcus pyogenes - MIC*    CLINDAMYCIN Value in next row Sensitive      SENSITIVE<=0.25    AMPICILLIN Value in next row Sensitive      SENSITIVE<=0.25    ERYTHROMYCIN Value in next row Sensitive      SENSITIVE0.12    VANCOMYCIN Value in next row Sensitive      SENSITIVE0.5    CEFTRIAXONE Value in next row Sensitive      SENSITIVE0.12    LEVOFLOXACIN Value in next row Sensitive      SENSITIVE0.5    * STREPTOCOCCUS PYOGENES  Blood Culture ID Panel (Reflexed)     Status: Abnormal   Collection Time: 03/13/2016  1:40 PM  Result Value Ref Range Status   Enterococcus species NOT DETECTED NOT DETECTED Final   Vancomycin resistance NOT DETECTED NOT DETECTED Final   Listeria monocytogenes NOT DETECTED NOT  DETECTED Final   Staphylococcus species NOT DETECTED NOT DETECTED Final   Staphylococcus aureus NOT DETECTED NOT DETECTED Final   Methicillin resistance NOT DETECTED NOT DETECTED Final   Streptococcus species NOT DETECTED NOT DETECTED Final   Streptococcus agalactiae NOT DETECTED NOT DETECTED Final   Streptococcus pneumoniae NOT DETECTED NOT DETECTED Final   Streptococcus pyogenes DETECTED (A) NOT DETECTED Final    Comment: CRITICAL RESULT CALLED TO, READ BACK BY AND VERIFIED WITH: NATE COOKSON AT 0426 02/27/16 CAF    Acinetobacter baumannii NOT DETECTED NOT DETECTED Final   Enterobacteriaceae species NOT DETECTED NOT DETECTED Final   Enterobacter cloacae complex NOT DETECTED NOT DETECTED Final   Escherichia coli NOT DETECTED NOT DETECTED Final   Klebsiella oxytoca NOT DETECTED NOT DETECTED Final   Klebsiella pneumoniae NOT DETECTED NOT DETECTED Final   Proteus species NOT DETECTED NOT DETECTED Final   Serratia marcescens NOT DETECTED NOT DETECTED Final   Carbapenem resistance NOT DETECTED NOT DETECTED Final   Haemophilus influenzae NOT DETECTED NOT DETECTED Final   Neisseria meningitidis NOT DETECTED NOT DETECTED Final   Pseudomonas aeruginosa NOT DETECTED NOT DETECTED Final   Candida albicans NOT DETECTED NOT DETECTED Final   Candida glabrata NOT DETECTED NOT DETECTED Final   Candida krusei NOT DETECTED NOT DETECTED Final   Candida parapsilosis NOT DETECTED NOT DETECTED Final   Candida tropicalis NOT DETECTED NOT DETECTED Final  Body fluid culture     Status: None (  Preliminary result)   Collection Time: 03/08/2016  3:05 PM  Result Value Ref Range Status   Specimen Description R Knee  Final   Special Requests NONE  Final   Gram Stain   Final    MODERATE WBC SEEN MODERATE GRAM POSITIVE COCCI IN PAIRS AND CHAINS    Culture   Final    HEAVY GROWTH GROUP A STREP (S.PYOGENES) ISOLATED There is no known Penicillin Resistant Beta Streptococcus in the [place] For patients that are  Penicillin-allergic, Erythromycin is 85-94% susceptible, and Clindamycin is 80% susceptible.  Contact Microbiology within 7 days if sensitivity testing is  required.      Report Status PENDING  Incomplete  Body fluid culture     Status: None (Preliminary result)   Collection Time: 03/08/2016  4:10 PM  Result Value Ref Range Status   Specimen Description R Knee  Final   Special Requests NONE  Final   Gram Stain   Final    MANY WBC SEEN MANY GRAM POSITIVE COCCI IN PAIRS AND CHAINS    Culture   Final    HEAVY GROWTH GROUP A STREP (S.PYOGENES) ISOLATED There is no known Penicillin Resistant Beta Streptococcus in the [place] For patients that are Penicillin-allergic, Erythromycin is 85-94% susceptible, and Clindamycin is 80% susceptible.  Contact Microbiology within 7 days if sensitivity testing is  required.      Report Status PENDING  Incomplete  MRSA PCR Screening     Status: None   Collection Time: 02/27/16  3:35 AM  Result Value Ref Range Status   MRSA by PCR NEGATIVE NEGATIVE Final    Comment:        The GeneXpert MRSA Assay (FDA approved for NASAL specimens only), is one component of a comprehensive MRSA colonization surveillance program. It is not intended to diagnose MRSA infection nor to guide or monitor treatment for MRSA infections.   Urine culture     Status: None   Collection Time: 02/27/16  8:30 AM  Result Value Ref Range Status   Specimen Description URINE, RANDOM  Final   Special Requests NONE  Final   Culture NO GROWTH 1 DAY  Final   Report Status 02/28/2016 FINAL  Final          IMAGING    Dg Chest 2 View  03/13/2016  CLINICAL DATA:  Nausea, vomiting, diarrhea and cough. Also weakness, congestion, fever and chills. History of CHF, diabetes. EXAM: CHEST  2 VIEW COMPARISON:  Chest x-rays dated 04/24/2015 and 03/20/2015. FINDINGS: Study is again hypoinspiratory with crowding of the perihilar bronchovascular markings. Given the low lung volumes, lungs appear  clear. Cardiomediastinal silhouette is stable in size and configuration. Median sternotomy wires are stable in alignment. Multiple old compression fracture deformities are seen within the upper lumbar spine, 1 of which is status post previous vertebroplasty. No evidence of acute osseous abnormality. Surgical clips again noted within the upper abdomen. IMPRESSION: Low lung volumes. No evidence of acute cardiopulmonary abnormality. No evidence of pneumonia. Electronically Signed   By: Franki Cabot M.D.   On: 03/05/2016 13:40   Dg Abd 1 View  02/27/2016  CLINICAL DATA:  Nasogastric tube placement EXAM: ABDOMEN - 1 VIEW COMPARISON:  02/27/2016 at 17:08 FINDINGS: Nasogastric tube is incompletely imaged but it extends into the stomach and appears to curl up into the fundus although the tip is off the superior edge of the image. Visible abdominal gas pattern is grossly unremarkable. IMPRESSION: Nasogastric tube reaches the stomach, tip is probably  in the fundus. Electronically Signed   By: Andreas Newport M.D.   On: 02/27/2016 22:02   Dg Abd 1 View  02/27/2016  CLINICAL DATA:  OG tube placement EXAM: ABDOMEN - 1 VIEW COMPARISON:  Chest x-ray same day FINDINGS: There is normal small bowel gas pattern. Surgical clips are noted in right and left upper abdomen. Prior vertebroplasty noted upper lumbar spine. The patient is status post median sternotomy. No NG tube is identified. Partially visualized endotracheal tube with tip about 3 cm above the carina. Right IJ central line is unchanged in position. IMPRESSION: The patient is status post median sternotomy. No NG tube is identified. Clinical correlation is necessary. Partially visualized endotracheal tube with tip about 3 cm above the carina. Right IJ central line is unchanged in position. I discussed with patient's nurse in ICU, Diane Electronically Signed   By: Lahoma Crocker M.D.   On: 02/27/2016 17:47   Dg Chest Port 1 View  02/27/2016  CLINICAL DATA:  Endotracheal  tube placement, respiratory failure. EXAM: PORTABLE CHEST 1 VIEW COMPARISON:  Same day. FINDINGS: Stable cardiomediastinal silhouette. Status post coronary artery bypass graft. Endotracheal tube is seen in grossly good position projected over tracheal air shadow with distal tip 3 cm above the carina. Atherosclerosis of thoracic aorta is noted. Hypoinflation of the lungs is noted. Mild bibasilar subsegmental atelectasis is noted with associated left pleural effusion. No pneumothorax is noted. Right internal jugular catheter is unchanged in position. IMPRESSION: Endotracheal tube in grossly good position. Hypoinflation of the lungs is noted. Stable bibasilar subsegmental atelectasis with associated mild left pleural effusion. Electronically Signed   By: Marijo Conception, M.D.   On: 02/27/2016 16:23   Dg Chest Port 1 View  02/27/2016  CLINICAL DATA:  Central line placement.  Initial encounter. EXAM: PORTABLE CHEST 1 VIEW COMPARISON:  Chest radiograph performed earlier today at 3:52 a.m. FINDINGS: A right IJ line is noted ending about the mid to distal SVC. The lungs are hypoexpanded. Bibasilar airspace opacities may reflect atelectasis or possibly pneumonia. Mild vascular crowding is noted. Small bilateral pleural effusions are suspected. No pneumothorax is seen. The cardiomediastinal silhouette is mildly enlarged. The patient is status post median sternotomy, with evidence of prior CABG. No acute osseous abnormalities are identified. Scattered clips are noted about the upper abdomen. IMPRESSION: 1. Right IJ line noted ending about the mid to distal SVC. 2. Lungs hypoexpanded. Bibasilar airspace opacities may reflect atelectasis or possibly pneumonia. 3. Suspect small bilateral pleural effusions.  Mild cardiomegaly. Electronically Signed   By: Garald Balding M.D.   On: 02/27/2016 05:57   Dg Chest Port 1 View  02/27/2016  CLINICAL DATA:  Acute onset of respiratory failure. Initial encounter. EXAM: PORTABLE CHEST 1  VIEW COMPARISON:  Chest radiograph performed 03/09/2016 FINDINGS: The lungs are hypoexpanded. Bibasilar airspace opacities may reflect atelectasis or pneumonia. No definite pleural effusion or pneumothorax is seen. The cardiomediastinal silhouette is borderline normal in size. The patient is status post median sternotomy, with evidence of prior CABG. Scattered clips are noted at the upper abdomen. No acute osseous abnormalities are identified. IMPRESSION: Lungs hypoexpanded. Bibasilar airspace opacities may reflect atelectasis or pneumonia. Electronically Signed   By: Garald Balding M.D.   On: 02/27/2016 04:04   Dg Knee Complete 4 Views Right  03/17/2016  CLINICAL DATA:  Acute knee pain.  Initial encounter. EXAM: RIGHT KNEE - COMPLETE 4+ VIEW COMPARISON:  None. FINDINGS: There is no evidence of acute fracture, subluxation or dislocation. A  small knee effusion is present. No focal bony lesions are present. Heavy vascular calcifications are present. IMPRESSION: Small knee effusion without acute bony abnormality. Heavy vascular calcifications. Electronically Signed   By: Margarette Canada M.D.   On: 03/03/2016 13:39        ASSESSMENT/PLAN    Indwelling Urinary Catheter continued, requirement due to   Reason to continue Indwelling Urinary Catheter for strict Intake/Output monitoring for hemodynamic instability   Central Line continued, requirement due to             INDWELLING DEVICES:: RT IJ CVL 4/3>>> ETT 8.0 4/3>>>  MICRO DATA: MRSA PCR negative Urine  Blood>> group A strep Resp  WOUND WN:UUVOZ A strep ANTIMICROBIALS: Zosyn and Vancomycin>>4/3 Clindamycin 4/3>>>>> PCN 4/3>>>>   ASSESSMENT/PLAN  75 yo white male admitted to ICU for acute septic shock with resp distress from severe acidosis and septic arthritis-GROUP A STREP Bacteremia  Patient at high risk for cardiac arrest and death, intubated for acute resp failure complicated by NSTEMI and  ARF  PULMONARY 1.Respiratory Failure -continue Full MV support -continue Bronchodilator Therapy -Wean Fio2 and PEEP as tolerated -will perform SAT/SBt when respiratory parameters are met    CARDIOVASCULAR-NSTEMI Septic shock -use vasopressors to keep MAP>65 -follow ABG and LA -follow ID recs -may consdier stress dose steroids -follow up cardiology recs    RENAL Follow chem 7 -ARF from ATN-cont IVFs, check UO  GASTROINTESTINAL Start TF's if tube available  HEMATOLOGIC Follow CBC  INFECTIOUS Infected RT knee -abx as prescribed -follow ortho recs -follow ID recs  NEUROLOGIC encphalopathy from acidosis Sedation-RAS goal -1 -wean sedation and assess neuro status    I have personally obtained a history, examined the patient, evaluated laboratory and independently reviewed imaging results, formulated the assessment and plan and placed orders.  The Patient requires high complexity decision making for assessment and support, frequent evaluation and titration of therapies, application of advanced monitoring technologies and extensive interpretation of multiple databases. Critical Care Time devoted to patient care services described in this note is 35 minutes.   Overall, patient is critically ill, prognosis is guarded. Patient at high risk for cardiac arrest and death.    Corrin Parker, M.D.  [facility]  Medical Director [place] Director [facility] Cardio-Pulmonary Department

## 2016-02-29 NOTE — Progress Notes (Signed)
Subjective:  Patient known to our practice from outpatient. He is followed for CKD st 3 Admitted for septic rt knee.  Also Dx with NSTEMI  Intubated 4/3 Currently on ventilator support Family at bedside UOP 1375 cc     Objective:  Vital signs in last 24 hours:  Temp:  [98.1 F (36.7 C)-99.9 F (37.7 C)] 98.8 F (37.1 C) (04/05 0800) Pulse Rate:  [78-91] 85 (04/05 0900) Resp:  [22-23] 23 (04/05 0900) BP: (94-112)/(47-63) 94/47 mmHg (04/05 0900) SpO2:  [94 %-100 %] 94 % (04/05 0900) FiO2 (%):  [30 %] 30 % (04/05 1012) Weight:  [85 kg (187 lb 6.3 oz)] 85 kg (187 lb 6.3 oz) (04/05 0500)  Weight change: 0.3 kg (10.6 oz) Filed Weights   02/27/16 0330 02/28/16 0415 02/29/16 0500  Weight: 81.1 kg (178 lb 12.7 oz) 84.7 kg (186 lb 11.7 oz) 85 kg (187 lb 6.3 oz)    Intake/Output:    Intake/Output Summary (Last 24 hours) at 02/29/16 1103 Last data filed at 02/29/16 1000  Gross per 24 hour  Intake 3493.73 ml  Output   1625 ml  Net 1868.73 ml     Physical Exam: General: Critically ill appearing, lethargic  HEENT ETT  Neck supple  Pulm/lungs Vent assisted  CVS/Heart Regular, soft systolic murmur  Abdomen:  Soft, non distended  Extremities: No peripheral edema, rt knee drain    Neurologic: Sedated, opens eyes to sound  Skin: Heel skin breakdown    Foley       Basic Metabolic Panel:   Recent Labs Lab 03/24/2016 1339 02/27/16 0055 02/27/16 0331 02/28/16 0431  NA 130* 132* 134* 135  K 4.3 4.1 3.9 3.1*  CL 90* 97* 100* 107  CO2 25 20* 22 19*  GLUCOSE 319* 374* 351* 221*  BUN 79* 76* 78* 73*  CREATININE 2.73* 2.50* 2.59* 2.19*  CALCIUM 8.6* 7.6* 7.7* 6.4*  MG  --   --  1.8  --      CBC:  Recent Labs Lab 03/02/2016 1339 02/27/16 0055 02/27/16 0331 02/28/16 0431 02/29/16 0513  WBC 16.8* 12.7* 11.2* 15.2* 18.3*  HGB 11.8* 11.1* 10.9* 9.7* 9.2*  HCT 35.5* 33.2* 33.0* 29.1* 27.4*  MCV 91.6 93.0 92.2 93.4 90.3  PLT 162 174 158 184 177       Microbiology:  Recent Results (from the past 720 hour(s))  Blood culture (routine x 2)     Status: None (Preliminary result)   Collection Time: 03/21/2016 12:59 PM  Result Value Ref Range Status   Specimen Description BLOOD LEFT FOREARM  Final   Special Requests BOTTLES DRAWN AEROBIC AND ANAEROBIC  1CC  Final   Culture  Setup Time   Final    GRAM POSITIVE COCCI AEROBIC BOTTLE ONLY CRITICAL VALUE NOTED.  VALUE IS CONSISTENT WITH PREVIOUSLY REPORTED AND CALLED VALUE.    Culture STREPTOCOCCUS PYOGENES AEROBIC BOTTLE ONLY   Final   Report Status PENDING  Incomplete   Organism ID, Bacteria STREPTOCOCCUS PYOGENES  Final      Susceptibility   Streptococcus pyogenes - MIC*    CLINDAMYCIN Value in next row Sensitive      SENSITIVE0.25    AMPICILLIN Value in next row Sensitive      SENSITIVE0.25    ERYTHROMYCIN Value in next row Sensitive      SENSITIVE0.12    VANCOMYCIN Value in next row Sensitive      SENSITIVE0.5    CEFTRIAXONE Value in next row Sensitive  SENSITIVE0.12    LEVOFLOXACIN Value in next row Sensitive      SENSITIVE0.5    * STREPTOCOCCUS PYOGENES  Rapid Influenza A&B Antigens ([facility] only)     Status: None   Collection Time: 03/11/2016  1:00 PM  Result Value Ref Range Status   Influenza A ([place]) NEGATIVE NEGATIVE Final   Influenza B ([facility]) NEGATIVE NEGATIVE Final  Blood culture (routine x 2)     Status: None   Collection Time: 03/02/2016  1:40 PM  Result Value Ref Range Status   Specimen Description BLOOD LEFT FOREARM  Final   Special Requests BOTTLES DRAWN AEROBIC AND ANAEROBIC  4CC  Final   Culture  Setup Time   Final    GRAM POSITIVE COCCI IN BOTH AEROBIC AND ANAEROBIC BOTTLES CRITICAL RESULT CALLED TO, READ BACK BY AND VERIFIED WITH: [place] AT 2671 02/27/16 CAF    Culture   Final    STREPTOCOCCUS PYOGENES IN BOTH AEROBIC AND ANAEROBIC BOTTLES    Report Status 02/29/2016 FINAL  Final   Organism ID, Bacteria STREPTOCOCCUS PYOGENES  Final       Susceptibility   Streptococcus pyogenes - MIC*    CLINDAMYCIN Value in next row Sensitive      SENSITIVE<=0.25    AMPICILLIN Value in next row Sensitive      SENSITIVE<=0.25    ERYTHROMYCIN Value in next row Sensitive      SENSITIVE0.12    VANCOMYCIN Value in next row Sensitive      SENSITIVE0.5    CEFTRIAXONE Value in next row Sensitive      SENSITIVE0.12    LEVOFLOXACIN Value in next row Sensitive      SENSITIVE0.5    * STREPTOCOCCUS PYOGENES  Blood Culture ID Panel (Reflexed)     Status: Abnormal   Collection Time: 03/19/2016  1:40 PM  Result Value Ref Range Status   Enterococcus species NOT DETECTED NOT DETECTED Final   Vancomycin resistance NOT DETECTED NOT DETECTED Final   Listeria monocytogenes NOT DETECTED NOT DETECTED Final   Staphylococcus species NOT DETECTED NOT DETECTED Final   Staphylococcus aureus NOT DETECTED NOT DETECTED Final   Methicillin resistance NOT DETECTED NOT DETECTED Final   Streptococcus species NOT DETECTED NOT DETECTED Final   Streptococcus agalactiae NOT DETECTED NOT DETECTED Final   Streptococcus pneumoniae NOT DETECTED NOT DETECTED Final   Streptococcus pyogenes DETECTED (A) NOT DETECTED Final    Comment: CRITICAL RESULT CALLED TO, READ BACK BY AND VERIFIED WITH: NATE COOKSON AT 0426 02/27/16 CAF    Acinetobacter baumannii NOT DETECTED NOT DETECTED Final   Enterobacteriaceae species NOT DETECTED NOT DETECTED Final   Enterobacter cloacae complex NOT DETECTED NOT DETECTED Final   Escherichia coli NOT DETECTED NOT DETECTED Final   Klebsiella oxytoca NOT DETECTED NOT DETECTED Final   Klebsiella pneumoniae NOT DETECTED NOT DETECTED Final   Proteus species NOT DETECTED NOT DETECTED Final   Serratia marcescens NOT DETECTED NOT DETECTED Final   Carbapenem resistance NOT DETECTED NOT DETECTED Final   Haemophilus influenzae NOT DETECTED NOT DETECTED Final   Neisseria meningitidis NOT DETECTED NOT DETECTED Final   Pseudomonas aeruginosa NOT DETECTED NOT  DETECTED Final   Candida albicans NOT DETECTED NOT DETECTED Final   Candida glabrata NOT DETECTED NOT DETECTED Final   Candida krusei NOT DETECTED NOT DETECTED Final   Candida parapsilosis NOT DETECTED NOT DETECTED Final   Candida tropicalis NOT DETECTED NOT DETECTED Final  Body fluid culture     Status: None (Preliminary result)  Collection Time: 02/29/2016  3:05 PM  Result Value Ref Range Status   Specimen Description R Knee  Final   Special Requests NONE  Final   Gram Stain   Final    MODERATE WBC SEEN MODERATE GRAM POSITIVE COCCI IN PAIRS AND CHAINS    Culture   Final    HEAVY GROWTH GROUP A STREP (S.PYOGENES) ISOLATED There is no known Penicillin Resistant Beta Streptococcus in the [place] For patients that are Penicillin-allergic, Erythromycin is 85-94% susceptible, and Clindamycin is 80% susceptible.  Contact Microbiology within 7 days if sensitivity testing is  required.      Report Status PENDING  Incomplete  Body fluid culture     Status: None (Preliminary result)   Collection Time: 03/10/2016  4:10 PM  Result Value Ref Range Status   Specimen Description R Knee  Final   Special Requests NONE  Final   Gram Stain   Final    MANY WBC SEEN MANY GRAM POSITIVE COCCI IN PAIRS AND CHAINS    Culture   Final    HEAVY GROWTH GROUP A STREP (S.PYOGENES) ISOLATED There is no known Penicillin Resistant Beta Streptococcus in the [place] For patients that are Penicillin-allergic, Erythromycin is 85-94% susceptible, and Clindamycin is 80% susceptible.  Contact Microbiology within 7 days if sensitivity testing is  required.      Report Status PENDING  Incomplete  MRSA PCR Screening     Status: None   Collection Time: 02/27/16  3:35 AM  Result Value Ref Range Status   MRSA by PCR NEGATIVE NEGATIVE Final    Comment:        The GeneXpert MRSA Assay (FDA approved for NASAL specimens only), is one component of a comprehensive MRSA colonization surveillance program. It is not intended to  diagnose MRSA infection nor to guide or monitor treatment for MRSA infections.   Urine culture     Status: None   Collection Time: 02/27/16  8:30 AM  Result Value Ref Range Status   Specimen Description URINE, RANDOM  Final   Special Requests NONE  Final   Culture NO GROWTH 1 DAY  Final   Report Status 02/28/2016 FINAL  Final    Coagulation Studies:  Recent Labs  02/27/16 0339  LABPROT 17.3*  INR 1.40    Urinalysis:  Recent Labs  02/27/16 0830  COLORURINE YELLOW*  LABSPEC 1.018  PHURINE 5.0  GLUCOSEU 50*  HGBUR 2+*  BILIRUBINUR NEGATIVE  KETONESUR NEGATIVE  PROTEINUR 30*  NITRITE NEGATIVE  LEUKOCYTESUR NEGATIVE      Imaging: Dg Abd 1 View  02/27/2016  CLINICAL DATA:  Nasogastric tube placement EXAM: ABDOMEN - 1 VIEW COMPARISON:  02/27/2016 at 17:08 FINDINGS: Nasogastric tube is incompletely imaged but it extends into the stomach and appears to curl up into the fundus although the tip is off the superior edge of the image. Visible abdominal gas pattern is grossly unremarkable. IMPRESSION: Nasogastric tube reaches the stomach, tip is probably in the fundus. Electronically Signed   By: Andreas Newport M.D.   On: 02/27/2016 22:02   Dg Abd 1 View  02/27/2016  CLINICAL DATA:  OG tube placement EXAM: ABDOMEN - 1 VIEW COMPARISON:  Chest x-ray same day FINDINGS: There is normal small bowel gas pattern. Surgical clips are noted in right and left upper abdomen. Prior vertebroplasty noted upper lumbar spine. The patient is status post median sternotomy. No NG tube is identified. Partially visualized endotracheal tube with tip about 3 cm above the carina. Right  IJ central line is unchanged in position. IMPRESSION: The patient is status post median sternotomy. No NG tube is identified. Clinical correlation is necessary. Partially visualized endotracheal tube with tip about 3 cm above the carina. Right IJ central line is unchanged in position. I discussed with patient's nurse in ICU,  Diane Electronically Signed   By: Lahoma Crocker M.D.   On: 02/27/2016 17:47   Dg Chest Port 1 View  02/27/2016  CLINICAL DATA:  Endotracheal tube placement, respiratory failure. EXAM: PORTABLE CHEST 1 VIEW COMPARISON:  Same day. FINDINGS: Stable cardiomediastinal silhouette. Status post coronary artery bypass graft. Endotracheal tube is seen in grossly good position projected over tracheal air shadow with distal tip 3 cm above the carina. Atherosclerosis of thoracic aorta is noted. Hypoinflation of the lungs is noted. Mild bibasilar subsegmental atelectasis is noted with associated left pleural effusion. No pneumothorax is noted. Right internal jugular catheter is unchanged in position. IMPRESSION: Endotracheal tube in grossly good position. Hypoinflation of the lungs is noted. Stable bibasilar subsegmental atelectasis with associated mild left pleural effusion. Electronically Signed   By: Marijo Conception, M.D.   On: 02/27/2016 16:23     Medications:   . feeding supplement (VITAL AF 1.2 CAL) 1,000 mL (02/29/16 0800)  . fentaNYL infusion INTRAVENOUS 50 mcg/hr (02/29/16 0800)  . heparin 1,550 Units/hr (02/29/16 0800)  . insulin (NOVOLIN-R) infusion 2.3 Units/hr (02/29/16 1015)  . norepinephrine 15 mcg/min (02/29/16 0901)  . propofol (DIPRIVAN) infusion 20.071 mcg/kg/min (02/29/16 0800)   . antiseptic oral rinse  7 mL Mouth Rinse QID  . aspirin  81 mg Per Tube Daily  . budesonide (PULMICORT) nebulizer solution  0.5 mg Nebulization BID  . chlorhexidine gluconate (SAGE KIT)  15 mL Mouth Rinse BID  . clindamycin (CLEOCIN) IV  900 mg Intravenous 3 times per day  . docusate  100 mg Per Tube BID  . free water  100 mL Per Tube 3 times per day  . gabapentin  300 mg Per Tube QHS  . ipratropium-albuterol  3 mL Nebulization QID  . pantoprazole (PROTONIX) IV  40 mg Intravenous Q24H  . pencillin G potassium IV  4 Million Units Intravenous 4 times per day  . potassium chloride  40 mEq Per Tube Once  .  pravastatin  40 mg Oral q1800  . sodium chloride flush  3 mL Intravenous Q12H   acetaminophen **OR** acetaminophen, bisacodyl, haloperidol lactate, morphine injection, ondansetron **OR** ondansetron (ZOFRAN) IV, sodium chloride flush  Assessment/ Plan:  75 y.o. Caucasian male  with significant medical history of coronary disease with four-vessel CABG in 1998, gallbladder rupture, extensive surgery, Diabetes with complications of retinopathy, peripheral neuropathy, peripheral vascular disease, Aorto iliac bypass, angioplasty and stent in his left leg, kyphoplasty for chronic back pain, CKD st 3 with Baseline Cr 1.5/ GFR 46  1. 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 Rt Knee septic arthritis 3. NSTEMI 4. DM-2 with CKD 5. Hypotension 6. Acute resp faliure. Intubated 4/3 7. Hypokalemia  Plan: Difficult situation. Patient is known vasculopath. With new NSTEMI - may need cardiac cath which will likely worsen his renal function. Concurrent septic knee is not helping.  Continue volume optimization.  UOP 1375 Monitor lytes daily    LOS: 3 Chantele Corado 4/5/201711:03 AM

## 2016-03-01 DIAGNOSIS — L8990 Pressure ulcer of unspecified site, unspecified stage: Secondary | ICD-10-CM

## 2016-03-01 LAB — GLUCOSE, CAPILLARY
GLUCOSE-CAPILLARY: 139 mg/dL — AB (ref 65–99)
GLUCOSE-CAPILLARY: 145 mg/dL — AB (ref 65–99)
GLUCOSE-CAPILLARY: 148 mg/dL — AB (ref 65–99)
GLUCOSE-CAPILLARY: 151 mg/dL — AB (ref 65–99)
GLUCOSE-CAPILLARY: 157 mg/dL — AB (ref 65–99)
GLUCOSE-CAPILLARY: 158 mg/dL — AB (ref 65–99)
GLUCOSE-CAPILLARY: 173 mg/dL — AB (ref 65–99)
GLUCOSE-CAPILLARY: 186 mg/dL — AB (ref 65–99)
GLUCOSE-CAPILLARY: 202 mg/dL — AB (ref 65–99)
GLUCOSE-CAPILLARY: 223 mg/dL — AB (ref 65–99)
Glucose-Capillary: 166 mg/dL — ABNORMAL HIGH (ref 65–99)
Glucose-Capillary: 170 mg/dL — ABNORMAL HIGH (ref 65–99)
Glucose-Capillary: 176 mg/dL — ABNORMAL HIGH (ref 65–99)
Glucose-Capillary: 167 mg/dL — ABNORMAL HIGH (ref 65–99)
Glucose-Capillary: 170 mg/dL — ABNORMAL HIGH (ref 65–99)
Glucose-Capillary: 199 mg/dL — ABNORMAL HIGH (ref 65–99)
Glucose-Capillary: 154 mg/dL — ABNORMAL HIGH (ref 65–99)
Glucose-Capillary: 143 mg/dL — ABNORMAL HIGH (ref 65–99)
Glucose-Capillary: 138 mg/dL — ABNORMAL HIGH (ref 65–99)

## 2016-03-01 LAB — CBC
HCT: 27.3 % — ABNORMAL LOW (ref 40.0–52.0)
Hemoglobin: 9.1 g/dL — ABNORMAL LOW (ref 13.0–18.0)
MCH: 30.6 pg (ref 26.0–34.0)
MCHC: 33.4 g/dL (ref 32.0–36.0)
MCV: 91.5 fL (ref 80.0–100.0)
PLATELETS: 178 10*3/uL (ref 150–440)
RBC: 2.98 MIL/uL — AB (ref 4.40–5.90)
RDW: 14.8 % — ABNORMAL HIGH (ref 11.5–14.5)
WBC: 15.7 10*3/uL — ABNORMAL HIGH (ref 3.8–10.6)

## 2016-03-01 LAB — BASIC METABOLIC PANEL
Anion gap: 8 (ref 5–15)
BUN: 63 mg/dL — AB (ref 6–20)
CHLORIDE: 100 mmol/L — AB (ref 101–111)
CO2: 19 mmol/L — ABNORMAL LOW (ref 22–32)
CREATININE: 2.07 mg/dL — AB (ref 0.61–1.24)
Calcium: 5.9 mg/dL — CL (ref 8.9–10.3)
GFR calc Af Amer: 35 mL/min — ABNORMAL LOW (ref >60)
GFR calc non Af Amer: 30 mL/min — ABNORMAL LOW (ref >60)
GLUCOSE: 209 mg/dL — AB (ref 65–99)
Potassium: 3.7 mmol/L (ref 3.5–5.1)
SODIUM: 127 mmol/L — AB (ref 135–145)

## 2016-03-01 LAB — HEPARIN LEVEL (UNFRACTIONATED)
Heparin Unfractionated: 0.29 IU/mL — ABNORMAL LOW (ref 0.30–0.70)
Heparin Unfractionated: 0.38 IU/mL (ref 0.30–0.70)

## 2016-03-01 LAB — PHOSPHORUS: Phosphorus: 3.1 mg/dL (ref 2.5–4.6)

## 2016-03-01 LAB — MAGNESIUM: Magnesium: 1.7 mg/dL (ref 1.7–2.4)

## 2016-03-01 MED ORDER — INSULIN ASPART 100 UNIT/ML ~~LOC~~ SOLN
2.0000 [IU] | SUBCUTANEOUS | Status: DC
  Administered 2016-03-01: 2 [IU] via SUBCUTANEOUS
  Administered 2016-03-01 – 2016-03-02 (×2): 6 [IU] via SUBCUTANEOUS
  Filled 2016-03-01: qty 6
  Filled 2016-03-01: qty 2
  Filled 2016-03-01: qty 6

## 2016-03-01 MED ORDER — HYDROCORTISONE NA SUCCINATE PF 100 MG IJ SOLR
50.0000 mg | Freq: Four times a day (QID) | INTRAMUSCULAR | Status: DC
  Administered 2016-03-01 – 2016-03-05 (×17): 50 mg via INTRAVENOUS
  Filled 2016-03-01 (×17): qty 2

## 2016-03-01 MED ORDER — MAGNESIUM SULFATE 2 GM/50ML IV SOLN
2.0000 g | Freq: Once | INTRAVENOUS | Status: AC
  Administered 2016-03-01: 2 g via INTRAVENOUS
  Filled 2016-03-01: qty 50

## 2016-03-01 MED ORDER — FAMOTIDINE IN NACL 20-0.9 MG/50ML-% IV SOLN
20.0000 mg | Freq: Two times a day (BID) | INTRAVENOUS | Status: DC
  Administered 2016-03-01 – 2016-03-04 (×7): 20 mg via INTRAVENOUS
  Filled 2016-03-01 (×8): qty 50

## 2016-03-01 MED ORDER — BISACODYL 10 MG RE SUPP
10.0000 mg | Freq: Every day | RECTAL | Status: DC
  Administered 2016-03-01: 10 mg via RECTAL
  Filled 2016-03-01: qty 1

## 2016-03-01 MED ORDER — VASOPRESSIN 20 UNIT/ML IV SOLN
0.0300 [IU]/min | INTRAVENOUS | Status: DC
  Administered 2016-03-01 – 2016-03-04 (×4): 0.03 [IU]/min via INTRAVENOUS
  Filled 2016-03-01 (×6): qty 2

## 2016-03-01 NOTE — Progress Notes (Signed)
Subjective:  Patient known to our practice from outpatient. He is followed for CKD st 3 Admitted for septic rt knee.  Also Dx with NSTEMI  Intubated 4/3 Currently on ventilator support UOP 1060 cc Insulin, Levophed, Propofol, fentanyl    Objective:  Vital signs in last 24 hours:  Temp:  [97.7 F (36.5 C)-100.2 F (37.9 C)] 98.8 F (37.1 C) (04/06 0715) Pulse Rate:  [77-94] 79 (04/06 0800) Resp:  [16-29] 22 (04/06 0800) BP: (80-122)/(46-106) 95/48 mmHg (04/06 0800) SpO2:  [94 %-100 %] 100 % (04/06 0800) FiO2 (%):  [30 %] 30 % (04/06 0800) Weight:  [88.8 kg (195 lb 12.3 oz)] 88.8 kg (195 lb 12.3 oz) (04/06 0512)  Weight change: 3.8 kg (8 lb 6 oz) Filed Weights   02/28/16 0415 02/29/16 0500 03/01/16 0512  Weight: 84.7 kg (186 lb 11.7 oz) 85 kg (187 lb 6.3 oz) 88.8 kg (195 lb 12.3 oz)    Intake/Output:    Intake/Output Summary (Last 24 hours) at 03/01/16 0825 Last data filed at 03/01/16 0800  Gross per 24 hour  Intake 3916.83 ml  Output    860 ml  Net 3056.83 ml     Physical Exam: General: Critically ill appearing, lethargic  HEENT ETT  Neck supple  Pulm/lungs Vent assisted  CVS/Heart Regular, soft systolic murmur  Abdomen:  Soft, non distended  Extremities: No peripheral edema, rt knee drain    Neurologic: Sedated, opens eyes to sound  Skin: Heel skin breakdown    Foley       Basic Metabolic Panel:   Recent Labs Lab 03/05/2016 1339 02/27/16 0055 02/27/16 0331 02/28/16 0431 02/29/16 1137  NA 130* 132* 134* 135 132*  K 4.3 4.1 3.9 3.1* 3.5  CL 90* 97* 100* 107 105  CO2 25 20* 22 19* 20*  GLUCOSE 319* 374* 351* 221* 173*  BUN 79* 76* 78* 73* 65*  CREATININE 2.73* 2.50* 2.59* 2.19* 1.91*  CALCIUM 8.6* 7.6* 7.7* 6.4* 6.2*  MG  --   --  1.8  --   --      CBC:  Recent Labs Lab 02/27/16 0055 02/27/16 0331 02/28/16 0431 02/29/16 0513 03/01/16 0744  WBC 12.7* 11.2* 15.2* 18.3* 15.7*  HGB 11.1* 10.9* 9.7* 9.2* 9.1*  HCT 33.2* 33.0* 29.1*  27.4* 27.3*  MCV 93.0 92.2 93.4 90.3 91.5  PLT 174 158 184 177 178      Microbiology:  Recent Results (from the past 720 hour(s))  Blood culture (routine x 2)     Status: None (Preliminary result)   Collection Time: 02/29/2016 12:59 PM  Result Value Ref Range Status   Specimen Description BLOOD LEFT FOREARM  Final   Special Requests BOTTLES DRAWN AEROBIC AND ANAEROBIC  1CC  Final   Culture  Setup Time   Final    GRAM POSITIVE COCCI AEROBIC BOTTLE ONLY CRITICAL VALUE NOTED.  VALUE IS CONSISTENT WITH PREVIOUSLY REPORTED AND CALLED VALUE.    Culture STREPTOCOCCUS PYOGENES AEROBIC BOTTLE ONLY   Final   Report Status PENDING  Incomplete   Organism ID, Bacteria STREPTOCOCCUS PYOGENES  Final      Susceptibility   Streptococcus pyogenes - MIC*    CLINDAMYCIN Value in next row Sensitive      SENSITIVE0.25    AMPICILLIN Value in next row Sensitive      SENSITIVE0.25    ERYTHROMYCIN Value in next row Sensitive      SENSITIVE0.12    VANCOMYCIN Value in next row Sensitive  SENSITIVE0.5    CEFTRIAXONE Value in next row Sensitive      SENSITIVE0.12    LEVOFLOXACIN Value in next row Sensitive      SENSITIVE0.5    * STREPTOCOCCUS PYOGENES  Rapid Influenza A&B Antigens ([facility] only)     Status: None   Collection Time: 02/27/2016  1:00 PM  Result Value Ref Range Status   Influenza A ([place]) NEGATIVE NEGATIVE Final   Influenza B ([facility]) NEGATIVE NEGATIVE Final  Blood culture (routine x 2)     Status: None   Collection Time: 03/04/2016  1:40 PM  Result Value Ref Range Status   Specimen Description BLOOD LEFT FOREARM  Final   Special Requests BOTTLES DRAWN AEROBIC AND ANAEROBIC  4CC  Final   Culture  Setup Time   Final    GRAM POSITIVE COCCI IN BOTH AEROBIC AND ANAEROBIC BOTTLES CRITICAL RESULT CALLED TO, READ BACK BY AND VERIFIED WITH: [place] AT 3557 02/27/16 CAF    Culture   Final    STREPTOCOCCUS PYOGENES IN BOTH AEROBIC AND ANAEROBIC BOTTLES    Report Status 02/29/2016 FINAL   Final   Organism ID, Bacteria STREPTOCOCCUS PYOGENES  Final      Susceptibility   Streptococcus pyogenes - MIC*    CLINDAMYCIN Value in next row Sensitive      SENSITIVE<=0.25    AMPICILLIN Value in next row Sensitive      SENSITIVE<=0.25    ERYTHROMYCIN Value in next row Sensitive      SENSITIVE0.12    VANCOMYCIN Value in next row Sensitive      SENSITIVE0.5    CEFTRIAXONE Value in next row Sensitive      SENSITIVE0.12    LEVOFLOXACIN Value in next row Sensitive      SENSITIVE0.5    * STREPTOCOCCUS PYOGENES  Blood Culture ID Panel (Reflexed)     Status: Abnormal   Collection Time: 03/22/2016  1:40 PM  Result Value Ref Range Status   Enterococcus species NOT DETECTED NOT DETECTED Final   Vancomycin resistance NOT DETECTED NOT DETECTED Final   Listeria monocytogenes NOT DETECTED NOT DETECTED Final   Staphylococcus species NOT DETECTED NOT DETECTED Final   Staphylococcus aureus NOT DETECTED NOT DETECTED Final   Methicillin resistance NOT DETECTED NOT DETECTED Final   Streptococcus species NOT DETECTED NOT DETECTED Final   Streptococcus agalactiae NOT DETECTED NOT DETECTED Final   Streptococcus pneumoniae NOT DETECTED NOT DETECTED Final   Streptococcus pyogenes DETECTED (A) NOT DETECTED Final    Comment: CRITICAL RESULT CALLED TO, READ BACK BY AND VERIFIED WITH: NATE COOKSON AT 0426 02/27/16 CAF    Acinetobacter baumannii NOT DETECTED NOT DETECTED Final   Enterobacteriaceae species NOT DETECTED NOT DETECTED Final   Enterobacter cloacae complex NOT DETECTED NOT DETECTED Final   Escherichia coli NOT DETECTED NOT DETECTED Final   Klebsiella oxytoca NOT DETECTED NOT DETECTED Final   Klebsiella pneumoniae NOT DETECTED NOT DETECTED Final   Proteus species NOT DETECTED NOT DETECTED Final   Serratia marcescens NOT DETECTED NOT DETECTED Final   Carbapenem resistance NOT DETECTED NOT DETECTED Final   Haemophilus influenzae NOT DETECTED NOT DETECTED Final   Neisseria meningitidis NOT  DETECTED NOT DETECTED Final   Pseudomonas aeruginosa NOT DETECTED NOT DETECTED Final   Candida albicans NOT DETECTED NOT DETECTED Final   Candida glabrata NOT DETECTED NOT DETECTED Final   Candida krusei NOT DETECTED NOT DETECTED Final   Candida parapsilosis NOT DETECTED NOT DETECTED Final   Candida tropicalis NOT DETECTED NOT  DETECTED Final  Body fluid culture     Status: None   Collection Time: 02/25/2016  3:05 PM  Result Value Ref Range Status   Specimen Description R Knee  Final   Special Requests NONE  Final   Gram Stain   Final    MODERATE WBC SEEN MODERATE GRAM POSITIVE COCCI IN PAIRS AND CHAINS    Culture   Final    HEAVY GROWTH GROUP A STREP (S.PYOGENES) ISOLATED There is no known Penicillin Resistant Beta Streptococcus in the [place] For patients that are Penicillin-allergic, Erythromycin is 85-94% susceptible, and Clindamycin is 80% susceptible.  Contact Microbiology within 7 days if sensitivity testing is  required.   NO ANAEROBES ISOLATED    Report Status 02/29/2016 FINAL  Final  Body fluid culture     Status: None   Collection Time: 03/10/2016  4:10 PM  Result Value Ref Range Status   Specimen Description R Knee  Final   Special Requests NONE  Final   Gram Stain   Final    MANY WBC SEEN MANY GRAM POSITIVE COCCI IN PAIRS AND CHAINS    Culture   Final    HEAVY GROWTH GROUP A STREP (S.PYOGENES) ISOLATED There is no known Penicillin Resistant Beta Streptococcus in the [place] For patients that are Penicillin-allergic, Erythromycin is 85-94% susceptible, and Clindamycin is 80% susceptible.  Contact Microbiology within 7 days if sensitivity testing is  required.   NO ANAEROBES ISOLATED    Report Status 02/29/2016 FINAL  Final  MRSA PCR Screening     Status: None   Collection Time: 02/27/16  3:35 AM  Result Value Ref Range Status   MRSA by PCR NEGATIVE NEGATIVE Final    Comment:        The GeneXpert MRSA Assay (FDA approved for NASAL specimens only), is one component of  a comprehensive MRSA colonization surveillance program. It is not intended to diagnose MRSA infection nor to guide or monitor treatment for MRSA infections.   Urine culture     Status: None   Collection Time: 02/27/16  8:30 AM  Result Value Ref Range Status   Specimen Description URINE, RANDOM  Final   Special Requests NONE  Final   Culture NO GROWTH 1 DAY  Final   Report Status 02/28/2016 FINAL  Final    Coagulation Studies: No results for input(s): LABPROT, INR in the last 72 hours.  Urinalysis:  Recent Labs  02/27/16 0830  COLORURINE YELLOW*  LABSPEC 1.018  PHURINE 5.0  GLUCOSEU 50*  HGBUR 2+*  BILIRUBINUR NEGATIVE  KETONESUR NEGATIVE  PROTEINUR 30*  NITRITE NEGATIVE  LEUKOCYTESUR NEGATIVE      Imaging: No results found.   Medications:   . feeding supplement (VITAL AF 1.2 CAL) 1,000 mL (03/01/16 0700)  . fentaNYL infusion INTRAVENOUS 50 mcg/hr (03/01/16 0700)  . heparin 1,550 Units/hr (03/01/16 0700)  . insulin (NOVOLIN-R) infusion 2.8 mL/hr at 03/01/16 0728  . norepinephrine 12 mcg/min (03/01/16 0700)  . propofol (DIPRIVAN) infusion 20 mcg/kg/min (03/01/16 0728)   . antiseptic oral rinse  7 mL Mouth Rinse QID  . aspirin  81 mg Per Tube Daily  . budesonide (PULMICORT) nebulizer solution  0.5 mg Nebulization BID  . chlorhexidine gluconate (SAGE KIT)  15 mL Mouth Rinse BID  . clindamycin (CLEOCIN) IV  900 mg Intravenous 3 times per day  . docusate  100 mg Per Tube BID  . feeding supplement (PRO-STAT SUGAR FREE 64)  30 mL Per Tube BID  . free water  100 mL Per Tube 3 times per day  . gabapentin  300 mg Per Tube QHS  . ipratropium-albuterol  3 mL Nebulization QID  . pantoprazole (PROTONIX) IV  40 mg Intravenous Q24H  . pencillin G potassium IV  4 Million Units Intravenous 4 times per day  . potassium chloride  40 mEq Per Tube Once  . pravastatin  40 mg Oral q1800  . sodium chloride flush  3 mL Intravenous Q12H   acetaminophen **OR** acetaminophen,  bisacodyl, haloperidol lactate, morphine injection, ondansetron **OR** ondansetron (ZOFRAN) IV, sodium chloride flush  Assessment/ Plan:  75 y.o. Caucasian male  with significant medical history of coronary disease with four-vessel CABG in 1998, gallbladder rupture, extensive surgery, Diabetes with complications of retinopathy, peripheral neuropathy, peripheral vascular disease, Aorto iliac bypass, angioplasty and stent in his left leg, kyphoplasty for chronic back pain, CKD st 3 with Baseline Cr 1.5/ GFR 46  1. 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 Rt Knee septic arthritis 3. NSTEMI 4. DM-2 with CKD 5. Hypotension 6. Acute resp faliure. Intubated 4/3 7. Hypokalemia  Plan:  Continue volume optimization.  UOP 100 Monitor lytes daily Supportive care    LOS: 4 Zabdi Mis 4/6/20178:25 AM

## 2016-03-01 NOTE — Progress Notes (Signed)
ANTICOAGULATION CONSULT NOTE - Follow up [facility] for heparin Indication: chest pain/ACS  Allergies  Allergen Reactions  . Tape Rash    Patient Measurements: Height: 6' (182.9 cm) Weight: 195 lb 12.3 oz (88.8 kg) IBW/kg (Calculated) : 77.6 Heparin Dosing Weight: 81.6 kg  Vital Signs: Temp: 98.9 F (37.2 C) (04/06 1600) Temp Source: Oral (04/06 1600) BP: 103/54 mmHg (04/06 1700) Pulse Rate: 72 (04/06 1700)  Labs:  Recent Labs  02/28/16 0431  02/29/16 0513 02/29/16 0601 02/29/16 1137 03/01/16 0744 03/01/16 1634  HGB 9.7*  --  9.2*  --   --  9.1*  --   HCT 29.1*  --  27.4*  --   --  27.3*  --   PLT 184  --  177  --   --  178  --   HEPARINUNFRC  --   < >  --  0.30  --  0.29* 0.38  CREATININE 2.19*  --   --   --  1.91* 2.07*  --   < > = values in this interval not displayed.  Estimated Creatinine Clearance: 34.9 mL/min (by C-G formula based on Cr of 2.07).   Medical History: Past Medical History  Diagnosis Date  . CHF (congestive heart failure) ([place])   . DM (diabetes mellitus) ([place])   . Pancreatic cyst   . Skin cancer   . DDD (degenerative disc disease), thoracic   . Renal failure   . Respiratory failure (H[facility])     Medications:  Infusions:  . feeding supplement (VITAL AF 1.2 CAL) 1,000 mL (03/01/16 1559)  . fentaNYL infusion INTRAVENOUS 50 mcg/hr (03/01/16 1000)  . heparin 1,650 Units/hr (03/01/16 0816)  . insulin (NOVOLIN-R) infusion 1.6 mL/hr at 03/01/16 1630  . norepinephrine 8 mcg/min (03/01/16 1626)  . propofol (DIPRIVAN) infusion 15 mcg/kg/min (03/01/16 1700)  . vasopressin (PITRESSIN) infusion - *FOR SHOCK* 0.03 Units/min (03/01/16 1145)    Assessment: 75 yom with rising troponin, pharmacy consulted to dose heparin for ACS/NSTEMI.  Goal of Therapy:  Heparin level 0.3-0.7 units/ml Monitor platelets by anticoagulation protocol: Yes   Plan:  4/4 PM heparin level 0.31. Recheck with AM labs to confirm.  4/5 AM heparin level  0.30. Continue current regimen. Recheck with CBC tomorrow AM.   4/6 AM HL 0.30. As patient has been hovering around the lower limit of therapeutic, will bump up rate slightly to 1650 units/hr (an increase by 100 units per hr). Will recheck level in 8 hrs.    4/6 PM: Heparin level therapeutic at 0.38. CCU RN confirms heparin drip running at 1650 units/hr; no line issues or s/sx of bleeding noted. Will continue heparin drip at current rate. Will recheck anti-Xa level in 8h to confirm and CBC in AM. Hgb and plt count stable from yesterday.   Pharmacy will continue to follow.   Rayna Sexton, PharmD, BCPS Clinical Pharmacist 03/01/2016 5:27 PM

## 2016-03-01 NOTE — Progress Notes (Signed)
[facility] Consultation      Date: 03/01/2016,   MRN# [id number] John Schultz 10/24/1941    AdmissionWeight: 180 lb (81.647 kg)                 CurrentWeight: 195 lb 12.3 oz (88.8 kg)   CHIEF COMPLAINT:   Follow up resp failure   HISTORY OF PRESENT ILLNESS   Remains intubated,sedated On vasopressors  Blood  Cultures and wound cultures Group A strep +POS Patient failed SAT/SBT yesterday due to resp muscle fatigue     MEDICATIONS    Home Medication:  No current outpatient prescriptions on file.  Current Medication:  Current facility-administered medications:  .  acetaminophen (TYLENOL) tablet 650 mg, 650 mg, Oral, Q6H PRN **OR** acetaminophen (TYLENOL) suppository 650 mg, 650 mg, Rectal, Q6H PRN, Idelle Crouch, MD .  antiseptic oral rinse solution (CORINZ), 7 mL, Mouth Rinse, QID, [name], MD, 7 mL at 03/01/16 0457 .  aspirin chewable tablet 81 mg, 81 mg, Per Tube, Daily, [name], MD, 81 mg at 02/29/16 1033 .  bisacodyl (DULCOLAX) suppository 10 mg, 10 mg, Rectal, Daily PRN, Idelle Crouch, MD .  budesonide (PULMICORT) nebulizer solution 0.5 mg, 0.5 mg, Nebulization, BID, [name], MD, 0.5 mg at 03/01/16 0737 .  chlorhexidine gluconate (SAGE KIT) (PERIDEX) 0.12 % solution 15 mL, 15 mL, Mouth Rinse, BID, [name], MD, 15 mL at 03/01/16 0800 .  clindamycin (CLEOCIN) IVPB 900 mg, 900 mg, Intravenous, 3 times per day, Leonel Ramsay, MD, 900 mg at 03/01/16 0500 .  docusate (COLACE) 50 MG/5ML liquid 100 mg, 100 mg, Per Tube, BID, [name], MD, 100 mg at 02/29/16 2159 .  feeding supplement (PRO-STAT SUGAR FREE 64) liquid 30 mL, 30 mL, Per Tube, BID, [name], MD, 30 mL at 02/29/16 2206 .  feeding supplement (VITAL AF 1.2 CAL) liquid 1,000 mL, 1,000 mL, Per Tube, Continuous, [name], MD, Last Rate: 55 mL/hr at 03/01/16 0700, 1,000 mL at 03/01/16 0700 .  fentaNYL 2557mg in NS 2540m(1089mml) infusion-PREMIX, 40 mcg/hr,  Intravenous, Continuous, KurFlora LippsD, Last Rate: 5 mL/hr at 03/01/16 0700, 50 mcg/hr at 03/01/16 0700 .  free water 100 mL, 100 mL, Per Tube, 3 times per day, KurFlora LippsD, 100 mL at 03/01/16 0500 .  gabapentin (NEURONTIN) tablet 300 mg, 300 mg, Per Tube, QHS, KurFlora LippsD, 300 mg at 02/29/16 2159 .  haloperidol lactate (HALDOL) injection 1 mg, 1 mg, Intravenous, Q6H PRN, RimTheodoro GristD .  heparin ADULT infusion 100 units/mL (25000 units/250 mL), 1,550 Units/hr, Intravenous, Continuous, KurFlora LippsD, Last Rate: 15.5 mL/hr at 03/01/16 0700, 1,550 Units/hr at 03/01/16 0700 .  insulin regular (NOVOLIN R,HUMULIN R) 250 Units in sodium chloride 0.9 % 250 mL (1 Units/mL) infusion, , Intravenous, Continuous, KurFlora LippsD, Last Rate: 2.1 mL/hr at 03/01/16 0630 .  ipratropium-albuterol (DUONEB) 0.5-2.5 (3) MG/3ML nebulizer solution 3 mL, 3 mL, Nebulization, QID, JefIdelle CrouchD, 3 mL at 03/01/16 0736 .  morphine 2 MG/ML injection 2 mg, 2 mg, Intravenous, Q2H PRN, JefIdelle CrouchD, 2 mg at 02/27/16 0041 .  norepinephrine (LEVOPHED) 4mg59m D5W 250mL38mmix infusion, 0-40 mcg/min, Intravenous, Titrated, VaibhVaughan Basta Last Rate: 45 mL/hr at 03/01/16 0700, 12 mcg/min at 03/01/16 0700 .  ondansetron (ZOFRAN) tablet 4 mg, 4 mg, Oral, Q6H PRN **OR** ondansetron (ZOFRAN) injection 4 mg, 4 mg, Intravenous, Q6H PRN, JeffrIdelle Crouch.  pantoprazole (PROTONIX)  injection 40 mg, 40 mg, Intravenous, Q24H, Mikael Spray, NP, 40 mg at 02/29/16 0902 .  penicillin G potassium 4 Million Units in dextrose 5 % 250 mL IVPB, 4 Million Units, Intravenous, 4 times per day, [name], MD, 4 Million Units at 03/01/16 0455 .  potassium chloride 20 MEQ/15ML (10%) solution 40 mEq, 40 mEq, Per Tube, Once, Raylene Miyamoto, MD .  pravastatin (PRAVACHOL) tablet 40 mg, 40 mg, Oral, q1800, Idelle Crouch, MD, 40 mg at 02/29/16 1823 .  propofol (DIPRIVAN) 1000 MG/100ML infusion, 0-50  mcg/kg/min, Intravenous, Continuous, [name], MD, Last Rate: 10.2 mL/hr at 03/01/16 0728, 20 mcg/kg/min at 03/01/16 0728 .  sodium chloride flush (NS) 0.9 % injection 10-40 mL, 10-40 mL, Intracatheter, PRN, [name], MD .  sodium chloride flush (NS) 0.9 % injection 3 mL, 3 mL, Intravenous, Q12H, Idelle Crouch, MD, 3 mL at 02/29/16 1000    ALLERGIES   Tape     REVIEW OF SYSTEMS   Review of Systems  Unable to perform ROS: critical illness     VS: BP 99/51 mmHg  Pulse 77  Temp(Src) 98.8 F (37.1 C) (Oral)  Resp 22  Ht 6' (1.829 m)  Wt 195 lb 12.3 oz (88.8 kg)  BMI 26.55 kg/m2  SpO2 100%     PHYSICAL EXAM  Physical Exam  Constitutional: No distress.  HENT:  Head: Normocephalic and atraumatic.  Eyes: Pupils are equal, round, and reactive to light. No scleral icterus.  Neck: Normal range of motion. Neck supple.  Cardiovascular: Normal rate and regular rhythm.   No murmur heard. Pulmonary/Chest: No respiratory distress. He has no wheezes. He has rales.  resp distress  Abdominal: Soft. He exhibits no distension. There is no tenderness.  Musculoskeletal: He exhibits edema.  RT knee with wound dressing  Neurological: He displays normal reflexes. Coordination normal.  gcs<8T  Skin: Skin is warm. No rash noted. He is diaphoretic.        LABS    Recent Labs     02/28/16  0431  02/29/16  0513  02/29/16  1137  HGB  9.7*  9.2*   --   HCT  29.1*  27.4*   --   MCV  93.4  90.3   --   WBC  15.2*  18.3*   --   BUN  73*   --   65*  CREATININE  2.19*   --   1.91*  GLUCOSE  221*   --   173*  CALCIUM  6.4*   --   6.2*  ,     CULTURE RESULTS   Recent Results (from the past 240 hour(s))  Blood culture (routine x 2)     Status: None (Preliminary result)   Collection Time: 03/22/2016 12:59 PM  Result Value Ref Range Status   Specimen Description BLOOD LEFT FOREARM  Final   Special Requests BOTTLES DRAWN AEROBIC AND ANAEROBIC  1CC  Final   Culture  Setup  Time   Final    GRAM POSITIVE COCCI AEROBIC BOTTLE ONLY CRITICAL VALUE NOTED.  VALUE IS CONSISTENT WITH PREVIOUSLY REPORTED AND CALLED VALUE.    Culture STREPTOCOCCUS PYOGENES AEROBIC BOTTLE ONLY   Final   Report Status PENDING  Incomplete   Organism ID, Bacteria STREPTOCOCCUS PYOGENES  Final      Susceptibility   Streptococcus pyogenes - MIC*    CLINDAMYCIN Value in next row Sensitive      SENSITIVE0.25    AMPICILLIN Value in  next row Sensitive      SENSITIVE0.25    ERYTHROMYCIN Value in next row Sensitive      SENSITIVE0.12    VANCOMYCIN Value in next row Sensitive      SENSITIVE0.5    CEFTRIAXONE Value in next row Sensitive      SENSITIVE0.12    LEVOFLOXACIN Value in next row Sensitive      SENSITIVE0.5    * STREPTOCOCCUS PYOGENES  Rapid Influenza A&B Antigens ([facility] only)     Status: None   Collection Time: 03/01/2016  1:00 PM  Result Value Ref Range Status   Influenza A ([place]) NEGATIVE NEGATIVE Final   Influenza B ([facility]) NEGATIVE NEGATIVE Final  Blood culture (routine x 2)     Status: None   Collection Time: 03/13/2016  1:40 PM  Result Value Ref Range Status   Specimen Description BLOOD LEFT FOREARM  Final   Special Requests BOTTLES DRAWN AEROBIC AND ANAEROBIC  4CC  Final   Culture  Setup Time   Final    GRAM POSITIVE COCCI IN BOTH AEROBIC AND ANAEROBIC BOTTLES CRITICAL RESULT CALLED TO, READ BACK BY AND VERIFIED WITH: NATE COOKSON AT 7209 02/27/16 CAF    Culture   Final    STREPTOCOCCUS PYOGENES IN BOTH AEROBIC AND ANAEROBIC BOTTLES    Report Status 02/29/2016 FINAL  Final   Organism ID, Bacteria STREPTOCOCCUS PYOGENES  Final      Susceptibility   Streptococcus pyogenes - MIC*    CLINDAMYCIN Value in next row Sensitive      SENSITIVE<=0.25    AMPICILLIN Value in next row Sensitive      SENSITIVE<=0.25    ERYTHROMYCIN Value in next row Sensitive      SENSITIVE0.12    VANCOMYCIN Value in next row Sensitive      SENSITIVE0.5    CEFTRIAXONE Value in next row  Sensitive      SENSITIVE0.12    LEVOFLOXACIN Value in next row Sensitive      SENSITIVE0.5    * STREPTOCOCCUS PYOGENES  Blood Culture ID Panel (Reflexed)     Status: Abnormal   Collection Time: 03/11/2016  1:40 PM  Result Value Ref Range Status   Enterococcus species NOT DETECTED NOT DETECTED Final   Vancomycin resistance NOT DETECTED NOT DETECTED Final   Listeria monocytogenes NOT DETECTED NOT DETECTED Final   Staphylococcus species NOT DETECTED NOT DETECTED Final   Staphylococcus aureus NOT DETECTED NOT DETECTED Final   Methicillin resistance NOT DETECTED NOT DETECTED Final   Streptococcus species NOT DETECTED NOT DETECTED Final   Streptococcus agalactiae NOT DETECTED NOT DETECTED Final   Streptococcus pneumoniae NOT DETECTED NOT DETECTED Final   Streptococcus pyogenes DETECTED (A) NOT DETECTED Final    Comment: CRITICAL RESULT CALLED TO, READ BACK BY AND VERIFIED WITH: NATE COOKSON AT 0426 02/27/16 CAF    Acinetobacter baumannii NOT DETECTED NOT DETECTED Final   Enterobacteriaceae species NOT DETECTED NOT DETECTED Final   Enterobacter cloacae complex NOT DETECTED NOT DETECTED Final   Escherichia coli NOT DETECTED NOT DETECTED Final   Klebsiella oxytoca NOT DETECTED NOT DETECTED Final   Klebsiella pneumoniae NOT DETECTED NOT DETECTED Final   Proteus species NOT DETECTED NOT DETECTED Final   Serratia marcescens NOT DETECTED NOT DETECTED Final   Carbapenem resistance NOT DETECTED NOT DETECTED Final   Haemophilus influenzae NOT DETECTED NOT DETECTED Final   Neisseria meningitidis NOT DETECTED NOT DETECTED Final   Pseudomonas aeruginosa NOT DETECTED NOT DETECTED Final   Candida albicans NOT DETECTED  NOT DETECTED Final   Candida glabrata NOT DETECTED NOT DETECTED Final   Candida krusei NOT DETECTED NOT DETECTED Final   Candida parapsilosis NOT DETECTED NOT DETECTED Final   Candida tropicalis NOT DETECTED NOT DETECTED Final  Body fluid culture     Status: None   Collection Time:  03/04/2016  3:05 PM  Result Value Ref Range Status   Specimen Description R Knee  Final   Special Requests NONE  Final   Gram Stain   Final    MODERATE WBC SEEN MODERATE GRAM POSITIVE COCCI IN PAIRS AND CHAINS    Culture   Final    HEAVY GROWTH GROUP A STREP (S.PYOGENES) ISOLATED There is no known Penicillin Resistant Beta Streptococcus in the [place] For patients that are Penicillin-allergic, Erythromycin is 85-94% susceptible, and Clindamycin is 80% susceptible.  Contact Microbiology within 7 days if sensitivity testing is  required.   NO ANAEROBES ISOLATED    Report Status 02/29/2016 FINAL  Final  Body fluid culture     Status: None   Collection Time: 03/04/2016  4:10 PM  Result Value Ref Range Status   Specimen Description R Knee  Final   Special Requests NONE  Final   Gram Stain   Final    MANY WBC SEEN MANY GRAM POSITIVE COCCI IN PAIRS AND CHAINS    Culture   Final    HEAVY GROWTH GROUP A STREP (S.PYOGENES) ISOLATED There is no known Penicillin Resistant Beta Streptococcus in the [place] For patients that are Penicillin-allergic, Erythromycin is 85-94% susceptible, and Clindamycin is 80% susceptible.  Contact Microbiology within 7 days if sensitivity testing is  required.   NO ANAEROBES ISOLATED    Report Status 02/29/2016 FINAL  Final  MRSA PCR Screening     Status: None   Collection Time: 02/27/16  3:35 AM  Result Value Ref Range Status   MRSA by PCR NEGATIVE NEGATIVE Final    Comment:        The GeneXpert MRSA Assay (FDA approved for NASAL specimens only), is one component of a comprehensive MRSA colonization surveillance program. It is not intended to diagnose MRSA infection nor to guide or monitor treatment for MRSA infections.   Urine culture     Status: None   Collection Time: 02/27/16  8:30 AM  Result Value Ref Range Status   Specimen Description URINE, RANDOM  Final   Special Requests NONE  Final   Culture NO GROWTH 1 DAY  Final   Report Status 02/28/2016  FINAL  Final          IMAGING    Dg Chest 2 View  02/25/2016  CLINICAL DATA:  Nausea, vomiting, diarrhea and cough. Also weakness, congestion, fever and chills. History of CHF, diabetes. EXAM: CHEST  2 VIEW COMPARISON:  Chest x-rays dated 04/24/2015 and 03/20/2015. FINDINGS: Study is again hypoinspiratory with crowding of the perihilar bronchovascular markings. Given the low lung volumes, lungs appear clear. Cardiomediastinal silhouette is stable in size and configuration. Median sternotomy wires are stable in alignment. Multiple old compression fracture deformities are seen within the upper lumbar spine, 1 of which is status post previous vertebroplasty. No evidence of acute osseous abnormality. Surgical clips again noted within the upper abdomen. IMPRESSION: Low lung volumes. No evidence of acute cardiopulmonary abnormality. No evidence of pneumonia. Electronically Signed   By: Franki Cabot M.D.   On: 02/28/2016 13:40   Dg Abd 1 View  02/27/2016  CLINICAL DATA:  Nasogastric tube placement EXAM: ABDOMEN - 1  VIEW COMPARISON:  02/27/2016 at 17:08 FINDINGS: Nasogastric tube is incompletely imaged but it extends into the stomach and appears to curl up into the fundus although the tip is off the superior edge of the image. Visible abdominal gas pattern is grossly unremarkable. IMPRESSION: Nasogastric tube reaches the stomach, tip is probably in the fundus. Electronically Signed   By: Andreas Newport M.D.   On: 02/27/2016 22:02   Dg Abd 1 View  02/27/2016  CLINICAL DATA:  OG tube placement EXAM: ABDOMEN - 1 VIEW COMPARISON:  Chest x-ray same day FINDINGS: There is normal small bowel gas pattern. Surgical clips are noted in right and left upper abdomen. Prior vertebroplasty noted upper lumbar spine. The patient is status post median sternotomy. No NG tube is identified. Partially visualized endotracheal tube with tip about 3 cm above the carina. Right IJ central line is unchanged in position. IMPRESSION:  The patient is status post median sternotomy. No NG tube is identified. Clinical correlation is necessary. Partially visualized endotracheal tube with tip about 3 cm above the carina. Right IJ central line is unchanged in position. I discussed with patient's nurse in ICU, Diane Electronically Signed   By: Lahoma Crocker M.D.   On: 02/27/2016 17:47   Dg Chest Port 1 View  02/27/2016  CLINICAL DATA:  Endotracheal tube placement, respiratory failure. EXAM: PORTABLE CHEST 1 VIEW COMPARISON:  Same day. FINDINGS: Stable cardiomediastinal silhouette. Status post coronary artery bypass graft. Endotracheal tube is seen in grossly good position projected over tracheal air shadow with distal tip 3 cm above the carina. Atherosclerosis of thoracic aorta is noted. Hypoinflation of the lungs is noted. Mild bibasilar subsegmental atelectasis is noted with associated left pleural effusion. No pneumothorax is noted. Right internal jugular catheter is unchanged in position. IMPRESSION: Endotracheal tube in grossly good position. Hypoinflation of the lungs is noted. Stable bibasilar subsegmental atelectasis with associated mild left pleural effusion. Electronically Signed   By: Marijo Conception, M.D.   On: 02/27/2016 16:23   Dg Chest Port 1 View  02/27/2016  CLINICAL DATA:  Central line placement.  Initial encounter. EXAM: PORTABLE CHEST 1 VIEW COMPARISON:  Chest radiograph performed earlier today at 3:52 a.m. FINDINGS: A right IJ line is noted ending about the mid to distal SVC. The lungs are hypoexpanded. Bibasilar airspace opacities may reflect atelectasis or possibly pneumonia. Mild vascular crowding is noted. Small bilateral pleural effusions are suspected. No pneumothorax is seen. The cardiomediastinal silhouette is mildly enlarged. The patient is status post median sternotomy, with evidence of prior CABG. No acute osseous abnormalities are identified. Scattered clips are noted about the upper abdomen. IMPRESSION: 1. Right IJ line  noted ending about the mid to distal SVC. 2. Lungs hypoexpanded. Bibasilar airspace opacities may reflect atelectasis or possibly pneumonia. 3. Suspect small bilateral pleural effusions.  Mild cardiomegaly. Electronically Signed   By: Garald Balding M.D.   On: 02/27/2016 05:57   Dg Chest Port 1 View  02/27/2016  CLINICAL DATA:  Acute onset of respiratory failure. Initial encounter. EXAM: PORTABLE CHEST 1 VIEW COMPARISON:  Chest radiograph performed 03/04/2016 FINDINGS: The lungs are hypoexpanded. Bibasilar airspace opacities may reflect atelectasis or pneumonia. No definite pleural effusion or pneumothorax is seen. The cardiomediastinal silhouette is borderline normal in size. The patient is status post median sternotomy, with evidence of prior CABG. Scattered clips are noted at the upper abdomen. No acute osseous abnormalities are identified. IMPRESSION: Lungs hypoexpanded. Bibasilar airspace opacities may reflect atelectasis or pneumonia. Electronically Signed  By: Garald Balding M.D.   On: 02/27/2016 04:04   Dg Knee Complete 4 Views Right  03/19/2016  CLINICAL DATA:  Acute knee pain.  Initial encounter. EXAM: RIGHT KNEE - COMPLETE 4+ VIEW COMPARISON:  None. FINDINGS: There is no evidence of acute fracture, subluxation or dislocation. A small knee effusion is present. No focal bony lesions are present. Heavy vascular calcifications are present. IMPRESSION: Small knee effusion without acute bony abnormality. Heavy vascular calcifications. Electronically Signed   By: Margarette Canada M.D.   On: 03/17/2016 13:39        ASSESSMENT/PLAN    Indwelling Urinary Catheter continued, requirement due to   Reason to continue Indwelling Urinary Catheter for strict Intake/Output monitoring for hemodynamic instability   Central Line continued, requirement due to      INDWELLING DEVICES:: RT IJ CVL 4/3>>> ETT 8.0 4/3>>>  MICRO DATA: MRSA PCR negative Urine  Blood>> group A strep Resp  WOUND  OZ:YYQMG A strep ANTIMICROBIALS: Zosyn and Vancomycin>>4/3 Clindamycin 4/3>>>>> PCN 4/3>>>>   ASSESSMENT/PLAN  75 yo white male admitted to ICU for acute septic shock with resp distress from severe acidosis and septic arthritis-GROUP A STREP Bacteremia  Patient at high risk for cardiac arrest and death, intubated for acute resp failure complicated by NSTEMI and ARF  PULMONARY 1.Respiratory Failure-will attempt SAT/SBT this AM when wife arrives -continue Full MV support -continue Bronchodilator Therapy -Wean Fio2 and PEEP as tolerated -will perform SAT/SBT when respiratory parameters are met  CARDIOVASCULAR-NSTEMI Septic shock -use vasopressors to keep MAP>65 -follow ABG and LA -follow ID recs -may consdier stress dose steroids -follow up cardiology recs    RENAL Follow chem 7 -ARF from ATN-cont IVFs, check UO  GASTROINTESTINAL Started TF's  HEMATOLOGIC Follow CBC  INFECTIOUS Infected RT knee -abx as prescribed -follow ortho recs -follow ID recs  NEUROLOGIC encphalopathy from acidosis Sedation-RAS goal -1 -wean sedation and assess neuro status    I have personally obtained a history, examined the patient, evaluated laboratory and independently reviewed imaging results, formulated the assessment and plan and placed orders.  The Patient requires high complexity decision making for assessment and support, frequent evaluation and titration of therapies, application of advanced monitoring technologies and extensive interpretation of multiple databases. Critical Care Time devoted to patient care services described in this note is 35 minutes.   Overall, patient is critically ill, prognosis is guarded. Patient at high risk for cardiac arrest and death.    Corrin Parker, M.D.  [facility]  Medical Director [place] Director [facility] Cardio-Pulmonary Department

## 2016-03-01 NOTE — Progress Notes (Signed)
Nutrition Follow-up     INTERVENTION:  -Continue vital 1.2 at 54ml/hr, goal rate to meet nutritional needs with prostat BID.     NUTRITION DIAGNOSIS:   Inadequate oral intake related to acute illness as evidenced by NPO status.    GOAL:   Provide needs based on ASPEN/SCCM guidelines    MONITOR:   Vent status, Labs, I & O's, Weight trends, Skin, TF tolerance  REASON FOR ASSESSMENT:   Ventilator    ASSESSMENT:   75 yo male admitted with septic shock from arthritis with recent aspiration of knee joint, respiratory distress with severe acidosis requiring intubation on 02/27/16  Pt failed weaning trial this am, remains on vent.  Medications reviewed: adding vasopressin this am, KCL, levophed, diprivan providing 270 kcals/d Labs reviewed: Na 127, BUN 63, creatinine 2.07, calcium 5.9, glucose 209   Diet Order:  Diet NPO time specified Except for: Sips with Meds  Skin:  Reviewed, no issues  Last BM:  03/19/2016  Height:   Ht Readings from Last 1 Encounters:  02/28/16 6' (1.829 m)    Weight:   Wt Readings from Last 1 Encounters:  03/01/16 195 lb 12.3 oz (88.8 kg)    Ideal Body Weight:     BMI:  Body mass index is 26.55 kg/(m^2).  Estimated Nutritional Needs:   Kcal:  2085 kcals (BEE 1625, Ve: 11, Tmax: 38.3) using wt of 84.7 kg  Protein:  128-170 g (1.5-2.0 g/kg)   Fluid:  >2 Liters  EDUCATION NEEDS:   Education needs no appropriate at this time  Chrisangel Eskenazi B. Zenia Resides, [place], [place] (pager) Weekend/On-Call pager [phone])

## 2016-03-01 NOTE — Progress Notes (Signed)
[place] [facility]  SUBJECTIVE: Intubated and sedated   Filed Vitals:   03/01/16 1005 03/01/16 1100 03/01/16 1200 03/01/16 1300  BP: 107/55 98/50 93/45  109/56  Pulse: 95 83 81 77  Temp:   98.8 F (37.1 C)   TempSrc:   Oral   Resp: 29 22 22 22   Height:      Weight:      SpO2: 100% 100% 100% 99%    Intake/Output Summary (Last 24 hours) at 03/01/16 1354 Last data filed at 03/01/16 1330  Gross per 24 hour  Intake 4545.73 ml  Output   1050 ml  Net 3495.73 ml    LABS: Basic Metabolic Panel:  Recent Labs  02/29/16 1137 03/01/16 0744  NA 132* 127*  K 3.5 3.7  CL 105 100*  CO2 20* 19*  GLUCOSE 173* 209*  BUN 65* 63*  CREATININE 1.91* 2.07*  CALCIUM 6.2* 5.9*   Liver Function Tests:  Recent Labs  02/29/16 0513  ALBUMIN 1.5*   No results for input(s): LIPASE, AMYLASE in the last 72 hours. CBC:  Recent Labs  02/29/16 0513 03/01/16 0744  WBC 18.3* 15.7*  HGB 9.2* 9.1*  HCT 27.4* 27.3*  MCV 90.3 91.5  PLT 177 178   Cardiac Enzymes: No results for input(s): CKTOTAL, CKMB, CKMBINDEX, TROPONINI in the last 72 hours. BNP: Invalid input(s): POCBNP D-Dimer: No results for input(s): DDIMER in the last 72 hours. Hemoglobin A1C: No results for input(s): HGBA1C in the last 72 hours. Fasting Lipid Panel:  Recent Labs  02/28/16 1031  TRIG 82   Thyroid Function Tests: No results for input(s): TSH, T4TOTAL, T3FREE, THYROIDAB in the last 72 hours.  Invalid input(s): FREET3 Anemia Panel: No results for input(s): VITAMINB12, FOLATE, FERRITIN, TIBC, IRON, RETICCTPCT in the last 72 hours.   Physical Exam: Blood pressure 109/56, pulse 77, temperature 98.8 F (37.1 C), temperature source Oral, resp. rate 22, height 6' (1.829 m), weight 88.8 kg (195 lb 12.3 oz), SpO2 99 %.   Wt Readings from Last 1 Encounters:  03/01/16 88.8 kg (195 lb 12.3 oz)     General appearance: intubated and sedated Resp: diminished breath sounds  bilaterally Cardio: regular rate and rhythm Extremities: edema 2= Neurologic: Mental status: intubated and sedated  TELEMETRY: Reviewed telemetry pt in sinus rhythm with intermittant lbbb:  ASSESSMENT AND PLAN:  Principal Problem:   Septic arthritis of knee, right (HCC)-s/p lavage. Being followed by orthopaedics Active Problems:   ARF (acute renal failure) (HCC)-ckd. Creatinine stable at approx. 2.0. Will follow   Acute bronchitis-intubated . Attempting wean   Hyponatremia-Na 127 this am. Limit diuresis   NSTEMI (non-ST elevated myocardial infarction) (HCC)-troponin increased to 7.28. Likely secondary to combination of sepsis and cad. Further cardiac workup pending recovery from sepsis. Ef 60-65 % by echo. Will continue with heparin for now and attempt vent wean.    Pressure ulcer   Septic shock (HCC)   Acute respiratory failure (HCC)    Teodoro Spray., MD, [facility] 03/01/2016 1:54 PM

## 2016-03-01 NOTE — Progress Notes (Signed)
Inpatient Diabetes Program Recommendations  AACE/ADA: New Consensus Statement on Inpatient Glycemic Control (2015)  Target Ranges:  Prepandial:   less than 140 mg/dL      Peak postprandial:   less than 180 mg/dL (1-2 hours)      Critically ill patients:  140 - 180 mg/dL   Review of Glycemic Control  Results for John Schultz, John Schultz (MRN [id number]) as of 03/01/2016 10:23  Ref. Range 03/01/2016 05:28 03/01/2016 06:34 03/01/2016 07:37 03/01/2016 08:38 03/01/2016 09:26  Glucose-Capillary Latest Ref Range: 65-99 mg/dL 170 (H) 167 (H) 202 (H) 199 (H) 170 (H)    Diabetes history: DM2 Outpatient Diabetes medications: Lantus 15 units QHS, Novolog 5 units TID with meals, Metformin XR 1500 mg QPM with supper Current orders for Inpatient glycemic control:Phase 2 Adult ICU Glycemic Control order set    Inpatient Diabetes Program Recommendations: Agree with current orders- transition patient when the following are met:  the insulin infusion rate is < 4 units / hour  tube feeds are at goal rate and stable   there are 6 subsequent CBG readings < 180 mg/dL  Gentry Fitz, RN, [place], [place], CDE Diabetes Coordinator Inpatient Diabetes Program  [phone] (Team Pager) [phone] ([place]) 03/01/2016 10:31 AM

## 2016-03-01 NOTE — Progress Notes (Signed)
[place] INFECTIOUS DISEASE PROGRESS NOTE Date of Admission:  03/18/2016     ID: John Schultz is a 75 y.o. male with  Grp A strep septic R knee and bacteremia  Principal Problem:   Septic arthritis of knee, right (HCC) Active Problems:   ARF (acute renal failure) (HCC)   Acute bronchitis   Hyponatremia   NSTEMI (non-ST elevated myocardial infarction) (HCC)   Pressure ulcer   Septic shock (HCC)   Acute respiratory failure (HCC)   Subjective: Remains intubated, decreasing on pressor, sedated. No fever since 4/3. Wbc 15   ROS  intubated  Medications:  Antibiotics Given (last 72 hours)    Date/Time Action Medication Dose Rate   02/27/16 1742 Given   clindamycin (CLEOCIN) IVPB 900 mg 900 mg 100 mL/hr   02/27/16 1848 Given   penicillin G potassium 4 Million Units in dextrose 5 % 250 mL IVPB 4 Million Units 250 mL/hr   02/27/16 2113 Given   penicillin G potassium 4 Million Units in dextrose 5 % 250 mL IVPB 4 Million Units 250 mL/hr   02/27/16 2228 Given   clindamycin (CLEOCIN) IVPB 900 mg 900 mg 100 mL/hr   02/28/16 0021 Given   penicillin G potassium 4 Million Units in dextrose 5 % 250 mL IVPB 4 Million Units 250 mL/hr   02/28/16 0424 Given   penicillin G potassium 4 Million Units in dextrose 5 % 250 mL IVPB 4 Million Units 250 mL/hr   02/28/16 0520 Given   clindamycin (CLEOCIN) IVPB 900 mg 900 mg 100 mL/hr   02/28/16 1026 Given   penicillin G potassium 4 Million Units in dextrose 5 % 250 mL IVPB 4 Million Units 250 mL/hr   02/28/16 1303 Given   clindamycin (CLEOCIN) IVPB 900 mg 900 mg 100 mL/hr   02/28/16 1824 Given   penicillin G potassium 4 Million Units in dextrose 5 % 250 mL IVPB 4 Million Units 250 mL/hr   02/28/16 2140 Given   clindamycin (CLEOCIN) IVPB 900 mg 900 mg 100 mL/hr   02/29/16 0033 Given   penicillin G potassium 4 Million Units in dextrose 5 % 250 mL IVPB 4 Million Units 250 mL/hr   02/29/16 0519 Given   clindamycin (CLEOCIN) IVPB 900 mg 900 mg  100 mL/hr   02/29/16 0610 Given   penicillin G potassium 4 Million Units in dextrose 5 % 250 mL IVPB 4 Million Units 250 mL/hr   02/29/16 1140 Given   penicillin G potassium 4 Million Units in dextrose 5 % 250 mL IVPB 4 Million Units 250 mL/hr   02/29/16 1524 Given   clindamycin (CLEOCIN) IVPB 900 mg 900 mg 100 mL/hr   02/29/16 1823 Given   penicillin G potassium 4 Million Units in dextrose 5 % 250 mL IVPB 4 Million Units 250 mL/hr   02/29/16 2158 Given   clindamycin (CLEOCIN) IVPB 900 mg 900 mg 100 mL/hr   03/01/16 0215 Given   penicillin G potassium 4 Million Units in dextrose 5 % 250 mL IVPB 4 Million Units 250 mL/hr   03/01/16 0455 Given   penicillin G potassium 4 Million Units in dextrose 5 % 250 mL IVPB 4 Million Units 250 mL/hr   03/01/16 0500 Given   clindamycin (CLEOCIN) IVPB 900 mg 900 mg 100 mL/hr   03/01/16 1256 Given   penicillin G potassium 4 Million Units in dextrose 5 % 250 mL IVPB 4 Million Units 250 mL/hr     . antiseptic oral rinse  7  mL Mouth Rinse QID  . aspirin  81 mg Per Tube Daily  . bisacodyl  10 mg Rectal Daily  . budesonide (PULMICORT) nebulizer solution  0.5 mg Nebulization BID  . chlorhexidine gluconate (SAGE KIT)  15 mL Mouth Rinse BID  . clindamycin (CLEOCIN) IV  900 mg Intravenous 3 times per day  . docusate  100 mg Per Tube BID  . famotidine (PEPCID) IV  20 mg Intravenous Q12H  . feeding supplement (PRO-STAT SUGAR FREE 64)  30 mL Per Tube BID  . free water  100 mL Per Tube 3 times per day  . gabapentin  300 mg Per Tube QHS  . hydrocortisone sod succinate (SOLU-CORTEF) inj  50 mg Intravenous Q6H  . ipratropium-albuterol  3 mL Nebulization QID  . pencillin G potassium IV  4 Million Units Intravenous 4 times per day  . potassium chloride  40 mEq Per Tube Once  . pravastatin  40 mg Oral q1800  . sodium chloride flush  3 mL Intravenous Q12H    Objective: Vital signs in last 24 hours: Temp:  [97.7 F (36.5 C)-100.2 F (37.9 C)] 98.8 F (37.1 C)  (04/06 1200) Pulse Rate:  [77-96] 77 (04/06 1300) Resp:  [16-29] 22 (04/06 1300) BP: (80-130)/(45-101) 109/56 mmHg (04/06 1300) SpO2:  [97 %-100 %] 99 % (04/06 1300) FiO2 (%):  [30 %] 30 % (04/06 1300) Weight:  [88.8 kg (195 lb 12.3 oz)] 88.8 kg (195 lb 12.3 oz) (04/06 0512) Constitutional: intubated, critically ill appearing.  HENT: perrla, eomi Mouth/Throat: Oropharynx - etet in place  Cardiovascular: tachy, reg Pulmonary/Chest: mech bs Abdominal: Soft. Bowel sounds are normal. He exhibits no distension. There is no tenderness.  Lymphadenopathy: He has no cervical adenopathy.  Neurological: intubated, sedated  Skin: Skin is warm and dry. No rash noted. No erythema.  Psychiatric: sedated Ext - L knee wrapped post op  Lab Results  Recent Labs  02/29/16 0513 02/29/16 1137 03/01/16 0744  WBC 18.3*  --  15.7*  HGB 9.2*  --  9.1*  HCT 27.4*  --  27.3*  NA  --  132* 127*  K  --  3.5 3.7  CL  --  105 100*  CO2  --  20* 19*  BUN  --  65* 63*  CREATININE  --  1.91* 2.07*    Microbiology: Results for orders placed or performed during the hospital encounter of 02/27/2016  Blood culture (routine x 2)     Status: None (Preliminary result)   Collection Time: 03/10/2016 12:59 PM  Result Value Ref Range Status   Specimen Description BLOOD LEFT FOREARM  Final   Special Requests BOTTLES DRAWN AEROBIC AND ANAEROBIC  1CC  Final   Culture  Setup Time   Final    GRAM POSITIVE COCCI AEROBIC BOTTLE ONLY CRITICAL VALUE NOTED.  VALUE IS CONSISTENT WITH PREVIOUSLY REPORTED AND CALLED VALUE.    Culture STREPTOCOCCUS PYOGENES AEROBIC BOTTLE ONLY   Final   Report Status PENDING  Incomplete   Organism ID, Bacteria STREPTOCOCCUS PYOGENES  Final      Susceptibility   Streptococcus pyogenes - MIC*    CLINDAMYCIN Value in next row Sensitive      SENSITIVE0.25    AMPICILLIN Value in next row Sensitive      SENSITIVE0.25    ERYTHROMYCIN Value in next row Sensitive      SENSITIVE0.12     VANCOMYCIN Value in next row Sensitive      SENSITIVE0.5    CEFTRIAXONE  Value in next row Sensitive      SENSITIVE0.12    LEVOFLOXACIN Value in next row Sensitive      SENSITIVE0.5    * STREPTOCOCCUS PYOGENES  Rapid Influenza A&B Antigens ([facility] only)     Status: None   Collection Time: 03/15/2016  1:00 PM  Result Value Ref Range Status   Influenza A ([place]) NEGATIVE NEGATIVE Final   Influenza B ([facility]) NEGATIVE NEGATIVE Final  Blood culture (routine x 2)     Status: None   Collection Time: 03/13/2016  1:40 PM  Result Value Ref Range Status   Specimen Description BLOOD LEFT FOREARM  Final   Special Requests BOTTLES DRAWN AEROBIC AND ANAEROBIC  4CC  Final   Culture  Setup Time   Final    GRAM POSITIVE COCCI IN BOTH AEROBIC AND ANAEROBIC BOTTLES CRITICAL RESULT CALLED TO, READ BACK BY AND VERIFIED WITH: [place] AT 0092 02/27/16 CAF    Culture   Final    STREPTOCOCCUS PYOGENES IN BOTH AEROBIC AND ANAEROBIC BOTTLES    Report Status 02/29/2016 FINAL  Final   Organism ID, Bacteria STREPTOCOCCUS PYOGENES  Final      Susceptibility   Streptococcus pyogenes - MIC*    CLINDAMYCIN Value in next row Sensitive      SENSITIVE<=0.25    AMPICILLIN Value in next row Sensitive      SENSITIVE<=0.25    ERYTHROMYCIN Value in next row Sensitive      SENSITIVE0.12    VANCOMYCIN Value in next row Sensitive      SENSITIVE0.5    CEFTRIAXONE Value in next row Sensitive      SENSITIVE0.12    LEVOFLOXACIN Value in next row Sensitive      SENSITIVE0.5    * STREPTOCOCCUS PYOGENES  Blood Culture ID Panel (Reflexed)     Status: Abnormal   Collection Time: 03/23/2016  1:40 PM  Result Value Ref Range Status   Enterococcus species NOT DETECTED NOT DETECTED Final   Vancomycin resistance NOT DETECTED NOT DETECTED Final   Listeria monocytogenes NOT DETECTED NOT DETECTED Final   Staphylococcus species NOT DETECTED NOT DETECTED Final   Staphylococcus aureus NOT DETECTED NOT DETECTED Final   Methicillin  resistance NOT DETECTED NOT DETECTED Final   Streptococcus species NOT DETECTED NOT DETECTED Final   Streptococcus agalactiae NOT DETECTED NOT DETECTED Final   Streptococcus pneumoniae NOT DETECTED NOT DETECTED Final   Streptococcus pyogenes DETECTED (A) NOT DETECTED Final    Comment: CRITICAL RESULT CALLED TO, READ BACK BY AND VERIFIED WITH: NATE COOKSON AT 0426 02/27/16 CAF    Acinetobacter baumannii NOT DETECTED NOT DETECTED Final   Enterobacteriaceae species NOT DETECTED NOT DETECTED Final   Enterobacter cloacae complex NOT DETECTED NOT DETECTED Final   Escherichia coli NOT DETECTED NOT DETECTED Final   Klebsiella oxytoca NOT DETECTED NOT DETECTED Final   Klebsiella pneumoniae NOT DETECTED NOT DETECTED Final   Proteus species NOT DETECTED NOT DETECTED Final   Serratia marcescens NOT DETECTED NOT DETECTED Final   Carbapenem resistance NOT DETECTED NOT DETECTED Final   Haemophilus influenzae NOT DETECTED NOT DETECTED Final   Neisseria meningitidis NOT DETECTED NOT DETECTED Final   Pseudomonas aeruginosa NOT DETECTED NOT DETECTED Final   Candida albicans NOT DETECTED NOT DETECTED Final   Candida glabrata NOT DETECTED NOT DETECTED Final   Candida krusei NOT DETECTED NOT DETECTED Final   Candida parapsilosis NOT DETECTED NOT DETECTED Final   Candida tropicalis NOT DETECTED NOT DETECTED Final  Body fluid culture  Status: None   Collection Time: 03/24/2016  3:05 PM  Result Value Ref Range Status   Specimen Description R Knee  Final   Special Requests NONE  Final   Gram Stain   Final    MODERATE WBC SEEN MODERATE GRAM POSITIVE COCCI IN PAIRS AND CHAINS    Culture   Final    HEAVY GROWTH GROUP A STREP (S.PYOGENES) ISOLATED There is no known Penicillin Resistant Beta Streptococcus in the [place] For patients that are Penicillin-allergic, Erythromycin is 85-94% susceptible, and Clindamycin is 80% susceptible.  Contact Microbiology within 7 days if sensitivity testing is  required.   NO  ANAEROBES ISOLATED    Report Status 02/29/2016 FINAL  Final  Body fluid culture     Status: None   Collection Time: 03/05/2016  4:10 PM  Result Value Ref Range Status   Specimen Description R Knee  Final   Special Requests NONE  Final   Gram Stain   Final    MANY WBC SEEN MANY GRAM POSITIVE COCCI IN PAIRS AND CHAINS    Culture   Final    HEAVY GROWTH GROUP A STREP (S.PYOGENES) ISOLATED There is no known Penicillin Resistant Beta Streptococcus in the [place] For patients that are Penicillin-allergic, Erythromycin is 85-94% susceptible, and Clindamycin is 80% susceptible.  Contact Microbiology within 7 days if sensitivity testing is  required.   NO ANAEROBES ISOLATED    Report Status 02/29/2016 FINAL  Final  MRSA PCR Screening     Status: None   Collection Time: 02/27/16  3:35 AM  Result Value Ref Range Status   MRSA by PCR NEGATIVE NEGATIVE Final    Comment:        The GeneXpert MRSA Assay (FDA approved for NASAL specimens only), is one component of a comprehensive MRSA colonization surveillance program. It is not intended to diagnose MRSA infection nor to guide or monitor treatment for MRSA infections.   Urine culture     Status: None   Collection Time: 02/27/16  8:30 AM  Result Value Ref Range Status   Specimen Description URINE, RANDOM  Final   Special Requests NONE  Final   Culture NO GROWTH 1 DAY  Final   Report Status 02/28/2016 FINAL  Final  Culture, blood (single) w Reflex to PCR ID Panel     Status: None (Preliminary result)   Collection Time: 02/29/16  2:36 PM  Result Value Ref Range Status   Specimen Description BLOOD LEFT HAND  Final   Special Requests BOTTLES DRAWN AEROBIC AND ANAEROBIC [place]  Final   Culture NO GROWTH < 24 HOURS  Final   Report Status PENDING  Incomplete    Studies/Results: No results found.  Assessment/Plan: John Schultz is a 76 y.o. male with Group A strep septic knee (Synovial WBC > 300K), sepsis, hypotensive, intubated,  no obvious sourse with no evidence cellulitis and no PNA on cxr.  Echo neg for vegetation Fu bcx 4/5 ngtd Recommendations Continue  pcn and clinda Continue supportive care Discussed with wife.  Thank you very much for the consult. Will follow with you.  Ricahrd Schwager P   03/01/2016, 1:47 PM

## 2016-03-01 NOTE — Progress Notes (Signed)
Failed SBT due to increased work of breathing, elevated RR, and rhythm change to LBB with 1st degree AV block.  Vasopressin gtt added.  Able to wean levophed to 81mcg.  Insulin gtt off at 1730.  Sliding scale insulin q 4hr initiated. Stool softener and dulcolax suppository given without result. Bowel sounds hypoactive. Wife at bedside most of day and here during SBT.  Pt was able tofollow commands and stay fairly calm today while awake.

## 2016-03-01 NOTE — Consult Note (Signed)
MEDICATION RELATED CONSULT NOTE - Follow up  Pharmacy Consult for Electrolyte Management Indication: Hypokalemia  Allergies  Allergen Reactions  . Tape Rash    Patient Measurements: Height: 6' (182.9 cm) Weight: 195 lb 12.3 oz (88.8 kg) IBW/kg (Calculated) : 77.6  Vital Signs: Temp: 98.9 F (37.2 C) (04/06 1600) Temp Source: Oral (04/06 1600) BP: 103/54 mmHg (04/06 1700) Pulse Rate: 72 (04/06 1700) Intake/Output from previous day: 04/05 0701 - 04/06 0700 In: 4041.6 [I.V.:1686.6; NG/GT:1205; IV Piggyback:1150] Out: 1060 [Urine:1060] Intake/Output from this shift: Total I/O In: 1501.8 [I.V.:661.8; NG/GT:440; IV Piggyback:400] Out: 340 [Urine:340]  Labs:  Recent Labs  02/28/16 0431 02/29/16 0513 02/29/16 1137 03/01/16 0744  WBC 15.2* 18.3*  --  15.7*  HGB 9.7* 9.2*  --  9.1*  HCT 29.1* 27.4*  --  27.3*  PLT 184 177  --  178  CREATININE 2.19*  --  1.91* 2.07*  MG  --   --   --  1.7  PHOS  --   --   --  3.1  ALBUMIN  --  1.5*  --   --    Estimated Creatinine Clearance: 34.9 mL/min (by C-G formula based on Cr of 2.07).  Assessment: John Schultz is a 76 yo male admitted for NSTEMI. Pharmacy consulted to manage electrolytes in this patient.  Plan:  4/6 K 3.7, checking Mag and Phos.  Will supplement as needed. Pharmacy will continue to follow.  Roe Coombs, PharmD Pharmacy Resident 03/01/2016   Addendum: Mag 1.7, Phos 3.1 - added on to AM labs at 0744 Pt received mag 2 g IV x1 today at 11 AM  Will follow up AM labs.   Rayna Sexton, PharmD, BCPS Clinical Pharmacist 03/01/2016 5:23 PM

## 2016-03-01 NOTE — Progress Notes (Signed)
No significant effusion to left knee.   Less warm. Continue to follwo

## 2016-03-01 NOTE — Progress Notes (Signed)
ANTICOAGULATION CONSULT NOTE - Initial Consult  Pharmacy Consult for heparin Indication: chest pain/ACS  Allergies  Allergen Reactions  . Tape Rash    Patient Measurements: Height: 6' (182.9 cm) Weight: 195 lb 12.3 oz (88.8 kg) IBW/kg (Calculated) : 77.6 Heparin Dosing Weight: 81.6 kg  Vital Signs: Temp: 98.8 F (37.1 C) (04/06 0715) Temp Source: Oral (04/06 0715) BP: 95/48 mmHg (04/06 0800) Pulse Rate: 79 (04/06 0800)  Labs:  Recent Labs  02/27/16 [id number]  02/28/16 0431 02/28/16 0953 02/28/16 2220 02/29/16 0513 02/29/16 0601 02/29/16 1137 03/01/16 0744  HGB  --   < > 9.7*  --   --  9.2*  --   --  9.1*  HCT  --   --  29.1*  --   --  27.4*  --   --  27.3*  PLT  --   --  184  --   --  177  --   --  178  HEPARINUNFRC  --   < >  --  0.29* 0.31  --  0.30  --   --   CREATININE  --   --  2.19*  --   --   --   --  1.91*  --   TROPONINI 8.10*  --   --   --   --   --   --   --   --   < > = values in this interval not displayed.  Estimated Creatinine Clearance: 37.8 mL/min (by C-G formula based on Cr of 1.91).   Medical History: Past Medical History  Diagnosis Date  . CHF (congestive heart failure) ([place])   . DM (diabetes mellitus) ([place])   . Pancreatic cyst   . Skin cancer   . DDD (degenerative disc disease), thoracic   . Renal failure   . Respiratory failure (HCC)     Medications:  Infusions:  . feeding supplement (VITAL AF 1.2 CAL) 1,000 mL (03/01/16 0700)  . fentaNYL infusion INTRAVENOUS 50 mcg/hr (03/01/16 0700)  . heparin 1,550 Units/hr (03/01/16 0700)  . insulin (NOVOLIN-R) infusion 2.1 mL/hr at 03/01/16 0630  . norepinephrine 12 mcg/min (03/01/16 0700)  . propofol (DIPRIVAN) infusion 20 mcg/kg/min (03/01/16 0728)    Assessment: 33 yom with rising troponin, pharmacy consulted to dose heparin for ACS.  Goal of Therapy:  Heparin level 0.3-0.7 units/ml Monitor platelets by anticoagulation protocol: Yes   Plan:  4/4 PM heparin level 0.31. Recheck with AM  labs to confirm.  4/5 AM heparin level 0.30. Continue current regimen. Recheck with CBC tomorrow AM.   4/6 AM HL 0.30. As patient has been hovering around the lower limit of therapeutic, will bump up rate slightly to 1650 units/hr (an increase by 100 units per hr). Will recheck level in 8 hrs.    Roe Coombs, PharmD Pharmacy Resident 03/01/2016

## 2016-03-01 NOTE — Consult Note (Signed)
MEDICATION RELATED CONSULT NOTE - INITIAL   Pharmacy Consult for Electrolyte Management Indication: Hypokalemia  Allergies  Allergen Reactions  . Tape Rash    Patient Measurements: Height: 6' (182.9 cm) Weight: 195 lb 12.3 oz (88.8 kg) IBW/kg (Calculated) : 77.6  Vital Signs: Temp: 98.8 F (37.1 C) (04/06 1200) Temp Source: Oral (04/06 1200) BP: 114/54 mmHg (04/06 1400) Pulse Rate: 77 (04/06 1400) Intake/Output from previous day: 04/05 0701 - 04/06 0700 In: 4041.6 [I.V.:1686.6; NG/GT:1205; IV Piggyback:1150] Out: 1060 [Urine:1060] Intake/Output from this shift: Total I/O In: 1199.5 [I.V.:469.5; NG/GT:330; IV Piggyback:400] Out: 240 [Urine:240]  Labs:  Recent Labs  02/28/16 0431 02/29/16 0513 02/29/16 1137 03/01/16 0744  WBC 15.2* 18.3*  --  15.7*  HGB 9.7* 9.2*  --  9.1*  HCT 29.1* 27.4*  --  27.3*  PLT 184 177  --  178  CREATININE 2.19*  --  1.91* 2.07*  ALBUMIN  --  1.5*  --   --    Estimated Creatinine Clearance: 34.9 mL/min (by C-G formula based on Cr of 2.07).   Microbiology: Recent Results (from the past 720 hour(s))  Blood culture (routine x 2)     Status: None (Preliminary result)   Collection Time: 02/25/2016 12:59 PM  Result Value Ref Range Status   Specimen Description BLOOD LEFT FOREARM  Final   Special Requests BOTTLES DRAWN AEROBIC AND ANAEROBIC  1CC  Final   Culture  Setup Time   Final    GRAM POSITIVE COCCI AEROBIC BOTTLE ONLY CRITICAL VALUE NOTED.  VALUE IS CONSISTENT WITH PREVIOUSLY REPORTED AND CALLED VALUE.    Culture STREPTOCOCCUS PYOGENES AEROBIC BOTTLE ONLY   Final   Report Status PENDING  Incomplete   Organism ID, Bacteria STREPTOCOCCUS PYOGENES  Final      Susceptibility   Streptococcus pyogenes - MIC*    CLINDAMYCIN Value in next row Sensitive      SENSITIVE0.25    AMPICILLIN Value in next row Sensitive      SENSITIVE0.25    ERYTHROMYCIN Value in next row Sensitive      SENSITIVE0.12    VANCOMYCIN Value in next row  Sensitive      SENSITIVE0.5    CEFTRIAXONE Value in next row Sensitive      SENSITIVE0.12    LEVOFLOXACIN Value in next row Sensitive      SENSITIVE0.5    * STREPTOCOCCUS PYOGENES  Rapid Influenza A&B Antigens ([facility] only)     Status: None   Collection Time: 03/18/2016  1:00 PM  Result Value Ref Range Status   Influenza A ([facility]) NEGATIVE NEGATIVE Final   Influenza B ([facility]) NEGATIVE NEGATIVE Final  Blood culture (routine x 2)     Status: None   Collection Time: 03/18/2016  1:40 PM  Result Value Ref Range Status   Specimen Description BLOOD LEFT FOREARM  Final   Special Requests BOTTLES DRAWN AEROBIC AND ANAEROBIC  4CC  Final   Culture  Setup Time   Final    GRAM POSITIVE COCCI IN BOTH AEROBIC AND ANAEROBIC BOTTLES CRITICAL RESULT CALLED TO, READ BACK BY AND VERIFIED WITH: NATE COOKSON AT 6237 02/27/16 CAF    Culture   Final    STREPTOCOCCUS PYOGENES IN BOTH AEROBIC AND ANAEROBIC BOTTLES    Report Status 02/29/2016 FINAL  Final   Organism ID, Bacteria STREPTOCOCCUS PYOGENES  Final      Susceptibility   Streptococcus pyogenes - MIC*    CLINDAMYCIN Value in next row Sensitive  SENSITIVE<=0.25    AMPICILLIN Value in next row Sensitive      SENSITIVE<=0.25    ERYTHROMYCIN Value in next row Sensitive      SENSITIVE0.12    VANCOMYCIN Value in next row Sensitive      SENSITIVE0.5    CEFTRIAXONE Value in next row Sensitive      SENSITIVE0.12    LEVOFLOXACIN Value in next row Sensitive      SENSITIVE0.5    * STREPTOCOCCUS PYOGENES  Blood Culture ID Panel (Reflexed)     Status: Abnormal   Collection Time: 02/28/2016  1:40 PM  Result Value Ref Range Status   Enterococcus species NOT DETECTED NOT DETECTED Final   Vancomycin resistance NOT DETECTED NOT DETECTED Final   Listeria monocytogenes NOT DETECTED NOT DETECTED Final   Staphylococcus species NOT DETECTED NOT DETECTED Final   Staphylococcus aureus NOT DETECTED NOT DETECTED Final   Methicillin resistance NOT DETECTED NOT DETECTED  Final   Streptococcus species NOT DETECTED NOT DETECTED Final   Streptococcus agalactiae NOT DETECTED NOT DETECTED Final   Streptococcus pneumoniae NOT DETECTED NOT DETECTED Final   Streptococcus pyogenes DETECTED (A) NOT DETECTED Final    Comment: CRITICAL RESULT CALLED TO, READ BACK BY AND VERIFIED WITH: NATE COOKSON AT 0426 02/27/16 CAF    Acinetobacter baumannii NOT DETECTED NOT DETECTED Final   Enterobacteriaceae species NOT DETECTED NOT DETECTED Final   Enterobacter cloacae complex NOT DETECTED NOT DETECTED Final   Escherichia coli NOT DETECTED NOT DETECTED Final   Klebsiella oxytoca NOT DETECTED NOT DETECTED Final   Klebsiella pneumoniae NOT DETECTED NOT DETECTED Final   Proteus species NOT DETECTED NOT DETECTED Final   Serratia marcescens NOT DETECTED NOT DETECTED Final   Carbapenem resistance NOT DETECTED NOT DETECTED Final   Haemophilus influenzae NOT DETECTED NOT DETECTED Final   Neisseria meningitidis NOT DETECTED NOT DETECTED Final   Pseudomonas aeruginosa NOT DETECTED NOT DETECTED Final   Candida albicans NOT DETECTED NOT DETECTED Final   Candida glabrata NOT DETECTED NOT DETECTED Final   Candida krusei NOT DETECTED NOT DETECTED Final   Candida parapsilosis NOT DETECTED NOT DETECTED Final   Candida tropicalis NOT DETECTED NOT DETECTED Final  Body fluid culture     Status: None   Collection Time: 03/22/2016  3:05 PM  Result Value Ref Range Status   Specimen Description R Knee  Final   Special Requests NONE  Final   Gram Stain   Final    MODERATE WBC SEEN MODERATE GRAM POSITIVE COCCI IN PAIRS AND CHAINS    Culture   Final    HEAVY GROWTH GROUP A STREP (S.PYOGENES) ISOLATED There is no known Penicillin Resistant Beta Streptococcus in the [place] For patients that are Penicillin-allergic, Erythromycin is 85-94% susceptible, and Clindamycin is 80% susceptible.  Contact Microbiology within 7 days if sensitivity testing is  required.   NO ANAEROBES ISOLATED    Report Status  02/29/2016 FINAL  Final  Body fluid culture     Status: None   Collection Time: 03/13/2016  4:10 PM  Result Value Ref Range Status   Specimen Description R Knee  Final   Special Requests NONE  Final   Gram Stain   Final    MANY WBC SEEN MANY GRAM POSITIVE COCCI IN PAIRS AND CHAINS    Culture   Final    HEAVY GROWTH GROUP A STREP (S.PYOGENES) ISOLATED There is no known Penicillin Resistant Beta Streptococcus in the [place] For patients that are Penicillin-allergic, Erythromycin is 85-94% susceptible,  and Clindamycin is 80% susceptible.  Contact Microbiology within 7 days if sensitivity testing is  required.   NO ANAEROBES ISOLATED    Report Status 02/29/2016 FINAL  Final  MRSA PCR Screening     Status: None   Collection Time: 02/27/16  3:35 AM  Result Value Ref Range Status   MRSA by PCR NEGATIVE NEGATIVE Final    Comment:        The GeneXpert MRSA Assay (FDA approved for NASAL specimens only), is one component of a comprehensive MRSA colonization surveillance program. It is not intended to diagnose MRSA infection nor to guide or monitor treatment for MRSA infections.   Urine culture     Status: None   Collection Time: 02/27/16  8:30 AM  Result Value Ref Range Status   Specimen Description URINE, RANDOM  Final   Special Requests NONE  Final   Culture NO GROWTH 1 DAY  Final   Report Status 02/28/2016 FINAL  Final  Culture, blood (single) w Reflex to PCR ID Panel     Status: None (Preliminary result)   Collection Time: 02/29/16  2:36 PM  Result Value Ref Range Status   Specimen Description BLOOD LEFT HAND  Final   Special Requests BOTTLES DRAWN AEROBIC AND ANAEROBIC [place]  Final   Culture NO GROWTH < 24 HOURS  Final   Report Status PENDING  Incomplete    Medical History: Past Medical History  Diagnosis Date  . CHF (congestive heart failure) ([place])   . DM (diabetes mellitus) ([place])   . Pancreatic cyst   . Skin cancer   . DDD (degenerative disc disease), thoracic    . Renal failure   . Respiratory failure (HCC)     Medications:  Scheduled:  . antiseptic oral rinse  7 mL Mouth Rinse QID  . aspirin  81 mg Per Tube Daily  . bisacodyl  10 mg Rectal Daily  . budesonide (PULMICORT) nebulizer solution  0.5 mg Nebulization BID  . chlorhexidine gluconate (SAGE KIT)  15 mL Mouth Rinse BID  . clindamycin (CLEOCIN) IV  900 mg Intravenous 3 times per day  . docusate  100 mg Per Tube BID  . famotidine (PEPCID) IV  20 mg Intravenous Q12H  . feeding supplement (PRO-STAT SUGAR FREE 64)  30 mL Per Tube BID  . free water  100 mL Per Tube 3 times per day  . gabapentin  300 mg Per Tube QHS  . hydrocortisone sod succinate (SOLU-CORTEF) inj  50 mg Intravenous Q6H  . ipratropium-albuterol  3 mL Nebulization QID  . pencillin G potassium IV  4 Million Units Intravenous 4 times per day  . potassium chloride  40 mEq Per Tube Once  . pravastatin  40 mg Oral q1800  . sodium chloride flush  3 mL Intravenous Q12H    Assessment: John Schultz is a 75 yo male admitted for NSTEMI. Pharmacy consulted to manage electrolytes in this patient.  Plan:  4/6 K 3.7, checking Mag and Phos.  Will supplement as needed. Pharmacy will continue to follow.  Roe Coombs, PharmD Pharmacy Resident 03/01/2016

## 2016-03-01 NOTE — Progress Notes (Signed)
Patient remains sedated on the vent, resting comfortably. Attempts to titrate levophed off, increased to 47mcg. No further changes on assessment. See [facility] for further assessment.

## 2016-03-02 DIAGNOSIS — E871 Hypo-osmolality and hyponatremia: Secondary | ICD-10-CM

## 2016-03-02 LAB — BLOOD GAS, ARTERIAL
Acid-base deficit: 10.8 mmol/L — ABNORMAL HIGH (ref 0.0–2.0)
Allens test (pass/fail): POSITIVE — AB
BICARBONATE: 15.3 meq/L — AB (ref 21.0–28.0)
FIO2: 0.3
LHR: 22 {breaths}/min
O2 Saturation: 95.5 %
PEEP/CPAP: 5 cmH2O
Patient temperature: 37
VT: 500 mL
pCO2 arterial: 34 mmHg (ref 32.0–48.0)
pH, Arterial: 7.26 — ABNORMAL LOW (ref 7.350–7.450)
pO2, Arterial: 90 mmHg (ref 83.0–108.0)

## 2016-03-02 LAB — GLUCOSE, CAPILLARY
GLUCOSE-CAPILLARY: 396 mg/dL — AB (ref 65–99)
GLUCOSE-CAPILLARY: 408 mg/dL — AB (ref 65–99)
GLUCOSE-CAPILLARY: 438 mg/dL — AB (ref 65–99)
GLUCOSE-CAPILLARY: 322 mg/dL — AB (ref 65–99)
GLUCOSE-CAPILLARY: 283 mg/dL — AB (ref 65–99)
GLUCOSE-CAPILLARY: 213 mg/dL — AB (ref 65–99)
GLUCOSE-CAPILLARY: 167 mg/dL — AB (ref 65–99)
GLUCOSE-CAPILLARY: 164 mg/dL — AB (ref 65–99)
GLUCOSE-CAPILLARY: 147 mg/dL — AB (ref 65–99)
Glucose-Capillary: 140 mg/dL — ABNORMAL HIGH (ref 65–99)
Glucose-Capillary: 167 mg/dL — ABNORMAL HIGH (ref 65–99)
Glucose-Capillary: 174 mg/dL — ABNORMAL HIGH (ref 65–99)
Glucose-Capillary: 185 mg/dL — ABNORMAL HIGH (ref 65–99)
Glucose-Capillary: 256 mg/dL — ABNORMAL HIGH (ref 65–99)
Glucose-Capillary: 274 mg/dL — ABNORMAL HIGH (ref 65–99)
Glucose-Capillary: 323 mg/dL — ABNORMAL HIGH (ref 65–99)
Glucose-Capillary: 376 mg/dL — ABNORMAL HIGH (ref 65–99)
Glucose-Capillary: 383 mg/dL — ABNORMAL HIGH (ref 65–99)
Glucose-Capillary: 397 mg/dL — ABNORMAL HIGH (ref 65–99)

## 2016-03-02 LAB — BASIC METABOLIC PANEL
Anion gap: 11 (ref 5–15)
Anion gap: 11 (ref 5–15)
BUN: 78 mg/dL — ABNORMAL HIGH (ref 6–20)
BUN: 81 mg/dL — AB (ref 6–20)
CALCIUM: 5.7 mg/dL — AB (ref 8.9–10.3)
CHLORIDE: 96 mmol/L — AB (ref 101–111)
CO2: 15 mmol/L — AB (ref 22–32)
CO2: 17 mmol/L — ABNORMAL LOW (ref 22–32)
CREATININE: 2.09 mg/dL — AB (ref 0.61–1.24)
Calcium: 5.7 mg/dL — CL (ref 8.9–10.3)
Chloride: 94 mmol/L — ABNORMAL LOW (ref 101–111)
Creatinine, Ser: 2.14 mg/dL — ABNORMAL HIGH (ref 0.61–1.24)
GFR calc Af Amer: 34 mL/min — ABNORMAL LOW (ref >60)
GFR calc Af Amer: 34 mL/min — ABNORMAL LOW (ref >60)
GFR calc non Af Amer: 30 mL/min — ABNORMAL LOW (ref >60)
GFR, EST NON AFRICAN AMERICAN: 29 mL/min — AB (ref >60)
GLUCOSE: 444 mg/dL — AB (ref 65–99)
GLUCOSE: 209 mg/dL — AB (ref 65–99)
POTASSIUM: 3.5 mmol/L (ref 3.5–5.1)
Potassium: 4.1 mmol/L (ref 3.5–5.1)
Sodium: 120 mmol/L — ABNORMAL LOW (ref 135–145)
Sodium: 124 mmol/L — ABNORMAL LOW (ref 135–145)

## 2016-03-02 LAB — CBC
HEMATOCRIT: 24.6 % — AB (ref 40.0–52.0)
Hemoglobin: 8.2 g/dL — ABNORMAL LOW (ref 13.0–18.0)
MCH: 30.4 pg (ref 26.0–34.0)
MCHC: 33.5 g/dL (ref 32.0–36.0)
MCV: 90.9 fL (ref 80.0–100.0)
Platelets: 148 10*3/uL — ABNORMAL LOW (ref 150–440)
RBC: 2.71 MIL/uL — ABNORMAL LOW (ref 4.40–5.90)
RDW: 14.8 % — AB (ref 11.5–14.5)
WBC: 12.8 10*3/uL — ABNORMAL HIGH (ref 3.8–10.6)

## 2016-03-02 LAB — PROCALCITONIN: Procalcitonin: 7.12 ng/mL

## 2016-03-02 LAB — MAGNESIUM: Magnesium: 2.2 mg/dL (ref 1.7–2.4)

## 2016-03-02 LAB — PHOSPHORUS: Phosphorus: 5.3 mg/dL — ABNORMAL HIGH (ref 2.5–4.6)

## 2016-03-02 LAB — HEPARIN LEVEL (UNFRACTIONATED): Heparin Unfractionated: 0.57 IU/mL (ref 0.30–0.70)

## 2016-03-02 LAB — TRIGLYCERIDES: Triglycerides: 58 mg/dL (ref <150)

## 2016-03-02 LAB — LACTIC ACID, PLASMA: LACTIC ACID, VENOUS: 1.3 mmol/L (ref 0.5–2.0)

## 2016-03-02 MED ORDER — SODIUM CHLORIDE 0.9 % IV SOLN
1.0000 g | Freq: Once | INTRAVENOUS | Status: AC
  Administered 2016-03-02: 1 g via INTRAVENOUS
  Filled 2016-03-02: qty 10

## 2016-03-02 MED ORDER — SODIUM CHLORIDE 0.9 % IV SOLN
2.0000 g | Freq: Once | INTRAVENOUS | Status: DC
  Filled 2016-03-02: qty 20

## 2016-03-02 MED ORDER — HEPARIN SODIUM (PORCINE) 5000 UNIT/ML IJ SOLN
5000.0000 [IU] | Freq: Three times a day (TID) | INTRAMUSCULAR | Status: DC
  Administered 2016-03-02 – 2016-03-07 (×15): 5000 [IU] via SUBCUTANEOUS
  Filled 2016-03-02 (×15): qty 1

## 2016-03-02 MED ORDER — STERILE WATER FOR INJECTION IV SOLN
INTRAVENOUS | Status: DC
  Administered 2016-03-02 – 2016-03-04 (×5): via INTRAVENOUS
  Filled 2016-03-02 (×9): qty 850

## 2016-03-02 MED ORDER — SODIUM CHLORIDE 0.9 % IV SOLN
0.0000 ug/min | INTRAVENOUS | Status: DC
  Administered 2016-03-02 – 2016-03-04 (×2): 2 ug/min via INTRAVENOUS
  Filled 2016-03-02 (×3): qty 4

## 2016-03-02 MED ORDER — BISACODYL 10 MG RE SUPP: 10.0000 mg | Freq: Once | RECTAL | Status: DC

## 2016-03-02 NOTE — Progress Notes (Signed)
Right knee has no effusion, no evidence of recurrence of infection. Will continue to follow.

## 2016-03-02 NOTE — Progress Notes (Signed)
Pt is resting comfortably at this time. Pt is in stable condition. No signs of pain using the CPOT scale. Pt has low output per foley. E link notified. Pt sodium has increased to 124. [place] notified. Pt has had 2 BM on my shift.  Pt remains on ventilator support at this time.

## 2016-03-02 NOTE — Consult Note (Addendum)
MEDICATION RELATED CONSULT NOTE - Follow up  Pharmacy Consult for Electrolyte Management Indication: Hypokalemia  Allergies  Allergen Reactions  . Tape Rash    Patient Measurements: Height: 6' (182.9 cm) Weight: 195 lb 12.3 oz (88.8 kg) IBW/kg (Calculated) : 77.6  Vital Signs: Temp: 98 F (36.7 C) (04/07 0400) Temp Source: Axillary (04/07 0400) BP: 89/47 mmHg (04/07 0700) Pulse Rate: 77 (04/07 0700) Intake/Output from previous day: 04/06 0701 - 04/07 0700 In: 3205.4 [I.V.:1439; NG/GT:766.3; IV Piggyback:1000] Out: 745 [Urine:745] Intake/Output from this shift:    Labs:  Recent Labs  02/29/16 0513 02/29/16 1137 03/01/16 0744 03/02/16 0740  WBC 18.3*  --  15.7* 12.8*  HGB 9.2*  --  9.1* 8.2*  HCT 27.4*  --  27.3* 24.6*  PLT 177  --  178 148*  CREATININE  --  1.91* 2.07* 2.09*  MG  --   --  1.7 2.2  PHOS  --   --  3.1 5.3*  ALBUMIN 1.5*  --   --   --    Estimated Creatinine Clearance: 34.6 mL/min (by C-G formula based on Cr of 2.09).  Assessment: John Schultz is a 75 yo male admitted for NSTEMI. Pharmacy consulted to manage electrolytes in this patient.  Plan:  K, mag WNL. Phos is high at 5.3. Will f/u AM labs.   Ulice Dash, PharmD Clinical Pharmacist  03/02/2016   Ca= 5.7, Alb (4/5)= 1.5, CCa= 7.7  Will replace calcium with 1 g Ca gluconate iv once and f/u am labs.   Ulice Dash, PharmD Clinical Pharmacist

## 2016-03-02 NOTE — Progress Notes (Signed)
[place] INFECTIOUS DISEASE PROGRESS NOTE Date of Admission:  03/09/2016     ID: John Schultz is a 75 y.o. male with  Grp A strep septic R knee and bacteremia  Principal Problem:   Septic arthritis of knee, right (HCC) Active Problems:   ARF (acute renal failure) (HCC)   Acute bronchitis   Hyponatremia   NSTEMI (non-ST elevated myocardial infarction) (HCC)   Pressure ulcer   Septic shock (HCC)   Acute respiratory failure (HCC)   Subjective: Remains intubated, decreasing on pressor - almost off, sedated. No fever since 4/3.  Having issues with elevated glucoses and low NA Some agitation - unable to wean from vent  ROS  intubated  Medications:  Antibiotics Given (last 72 hours)    Date/Time Action Medication Dose Rate   02/28/16 1824 Given   penicillin G potassium 4 Million Units in dextrose 5 % 250 mL IVPB 4 Million Units 250 mL/hr   02/28/16 2140 Given   clindamycin (CLEOCIN) IVPB 900 mg 900 mg 100 mL/hr   02/29/16 0033 Given   penicillin G potassium 4 Million Units in dextrose 5 % 250 mL IVPB 4 Million Units 250 mL/hr   02/29/16 0519 Given   clindamycin (CLEOCIN) IVPB 900 mg 900 mg 100 mL/hr   02/29/16 0610 Given   penicillin G potassium 4 Million Units in dextrose 5 % 250 mL IVPB 4 Million Units 250 mL/hr   02/29/16 1140 Given   penicillin G potassium 4 Million Units in dextrose 5 % 250 mL IVPB 4 Million Units 250 mL/hr   02/29/16 1524 Given   clindamycin (CLEOCIN) IVPB 900 mg 900 mg 100 mL/hr   02/29/16 1823 Given   penicillin G potassium 4 Million Units in dextrose 5 % 250 mL IVPB 4 Million Units 250 mL/hr   02/29/16 2158 Given   clindamycin (CLEOCIN) IVPB 900 mg 900 mg 100 mL/hr   03/01/16 0215 Given   penicillin G potassium 4 Million Units in dextrose 5 % 250 mL IVPB 4 Million Units 250 mL/hr   03/01/16 0455 Given   penicillin G potassium 4 Million Units in dextrose 5 % 250 mL IVPB 4 Million Units 250 mL/hr   03/01/16 0500 Given   clindamycin  (CLEOCIN) IVPB 900 mg 900 mg 100 mL/hr   03/01/16 1256 Given   penicillin G potassium 4 Million Units in dextrose 5 % 250 mL IVPB 4 Million Units 250 mL/hr   03/01/16 1400 Given   clindamycin (CLEOCIN) IVPB 900 mg 900 mg 100 mL/hr   03/01/16 1741 Given   penicillin G potassium 4 Million Units in dextrose 5 % 250 mL IVPB 4 Million Units 250 mL/hr   03/01/16 2102 Given   clindamycin (CLEOCIN) IVPB 900 mg 900 mg 100 mL/hr   03/02/16 0037 Given   penicillin G potassium 4 Million Units in dextrose 5 % 250 mL IVPB 4 Million Units 250 mL/hr   03/02/16 0509 Given   clindamycin (CLEOCIN) IVPB 900 mg 900 mg 100 mL/hr   03/02/16 0604 Given   penicillin G potassium 4 Million Units in dextrose 5 % 250 mL IVPB 4 Million Units 250 mL/hr   03/02/16 1319 Given   penicillin G potassium 4 Million Units in dextrose 5 % 250 mL IVPB 4 Million Units 250 mL/hr   03/02/16 1424 Given   clindamycin (CLEOCIN) IVPB 900 mg 900 mg 100 mL/hr     . antiseptic oral rinse  7 mL Mouth Rinse QID  . aspirin  81 mg Per Tube Daily  . budesonide (PULMICORT) nebulizer solution  0.5 mg Nebulization BID  . chlorhexidine gluconate (SAGE KIT)  15 mL Mouth Rinse BID  . clindamycin (CLEOCIN) IV  900 mg Intravenous 3 times per day  . docusate  100 mg Per Tube BID  . famotidine (PEPCID) IV  20 mg Intravenous Q12H  . feeding supplement (PRO-STAT SUGAR FREE 64)  30 mL Per Tube BID  . gabapentin  300 mg Per Tube QHS  . heparin subcutaneous  5,000 Units Subcutaneous 3 times per day  . hydrocortisone sod succinate (SOLU-CORTEF) inj  50 mg Intravenous Q6H  . ipratropium-albuterol  3 mL Nebulization QID  . pencillin G potassium IV  4 Million Units Intravenous 4 times per day  . potassium chloride  40 mEq Per Tube Once  . pravastatin  40 mg Oral q1800  . sodium chloride flush  3 mL Intravenous Q12H    Objective: Vital signs in last 24 hours: Temp:  [97.1 F (36.2 C)-98.9 F (37.2 C)] 98.2 F (36.8 C) (04/07 1200) Pulse Rate:   [72-97] 72 (04/07 1300) Resp:  [14-24] 14 (04/07 1300) BP: (78-116)/(40-70) 79/40 mmHg (04/07 1300) SpO2:  [97 %-100 %] 99 % (04/07 1300) FiO2 (%):  [30 %] 30 % (04/07 1200) Constitutional: intubated, critically ill appearing.  HENT: perrla, eomi Mouth/Throat: Oropharynx - etet in place  Cardiovascular: tachy, reg Pulmonary/Chest: mech bs Abdominal: Soft. Bowel sounds are normal. He exhibits no distension. There is no tenderness.  Lymphadenopathy: He has no cervical adenopathy.  Neurological: intubated, sedated  Skin: Skin is warm and dry. No rash noted. No erythema.  Psychiatric: sedated Ext - L knee wrapped post op  Lab Results  Recent Labs  03/01/16 0744 03/02/16 0740  WBC 15.7* 12.8*  HGB 9.1* 8.2*  HCT 27.3* 24.6*  NA 127* 120*  K 3.7 4.1  CL 100* 94*  CO2 19* 15*  BUN 63* 78*  CREATININE 2.07* 2.09*    Microbiology: Results for orders placed or performed during the hospital encounter of 03/17/2016  Blood culture (routine x 2)     Status: None (Preliminary result)   Collection Time: 02/27/2016 12:59 PM  Result Value Ref Range Status   Specimen Description BLOOD LEFT FOREARM  Final   Special Requests BOTTLES DRAWN AEROBIC AND ANAEROBIC  1CC  Final   Culture  Setup Time   Final    GRAM POSITIVE COCCI AEROBIC BOTTLE ONLY CRITICAL VALUE NOTED.  VALUE IS CONSISTENT WITH PREVIOUSLY REPORTED AND CALLED VALUE.    Culture STREPTOCOCCUS PYOGENES AEROBIC BOTTLE ONLY   Final   Report Status PENDING  Incomplete   Organism ID, Bacteria STREPTOCOCCUS PYOGENES  Final      Susceptibility   Streptococcus pyogenes - MIC*    CLINDAMYCIN Value in next row Sensitive      SENSITIVE0.25    AMPICILLIN Value in next row Sensitive      SENSITIVE0.25    ERYTHROMYCIN Value in next row Sensitive      SENSITIVE0.12    VANCOMYCIN Value in next row Sensitive      SENSITIVE0.5    CEFTRIAXONE Value in next row Sensitive      SENSITIVE0.12    LEVOFLOXACIN Value in next row Sensitive       SENSITIVE0.5    * STREPTOCOCCUS PYOGENES  Rapid Influenza A&B Antigens ([facility] only)     Status: None   Collection Time: 03/11/2016  1:00 PM  Result Value Ref Range Status   Influenza  A ([place]) NEGATIVE NEGATIVE Final   Influenza B ([place]) NEGATIVE NEGATIVE Final  Blood culture (routine x 2)     Status: None   Collection Time: 03/02/2016  1:40 PM  Result Value Ref Range Status   Specimen Description BLOOD LEFT FOREARM  Final   Special Requests BOTTLES DRAWN AEROBIC AND ANAEROBIC  4CC  Final   Culture  Setup Time   Final    GRAM POSITIVE COCCI IN BOTH AEROBIC AND ANAEROBIC BOTTLES CRITICAL RESULT CALLED TO, READ BACK BY AND VERIFIED WITH: [place] AT 2248 02/27/16 CAF    Culture   Final    STREPTOCOCCUS PYOGENES IN BOTH AEROBIC AND ANAEROBIC BOTTLES    Report Status 02/29/2016 FINAL  Final   Organism ID, Bacteria STREPTOCOCCUS PYOGENES  Final      Susceptibility   Streptococcus pyogenes - MIC*    CLINDAMYCIN Value in next row Sensitive      SENSITIVE<=0.25    AMPICILLIN Value in next row Sensitive      SENSITIVE<=0.25    ERYTHROMYCIN Value in next row Sensitive      SENSITIVE0.12    VANCOMYCIN Value in next row Sensitive      SENSITIVE0.5    CEFTRIAXONE Value in next row Sensitive      SENSITIVE0.12    LEVOFLOXACIN Value in next row Sensitive      SENSITIVE0.5    * STREPTOCOCCUS PYOGENES  Blood Culture ID Panel (Reflexed)     Status: Abnormal   Collection Time: 03/21/2016  1:40 PM  Result Value Ref Range Status   Enterococcus species NOT DETECTED NOT DETECTED Final   Vancomycin resistance NOT DETECTED NOT DETECTED Final   Listeria monocytogenes NOT DETECTED NOT DETECTED Final   Staphylococcus species NOT DETECTED NOT DETECTED Final   Staphylococcus aureus NOT DETECTED NOT DETECTED Final   Methicillin resistance NOT DETECTED NOT DETECTED Final   Streptococcus species NOT DETECTED NOT DETECTED Final   Streptococcus agalactiae NOT DETECTED NOT DETECTED Final   Streptococcus  pneumoniae NOT DETECTED NOT DETECTED Final   Streptococcus pyogenes DETECTED (A) NOT DETECTED Final    Comment: CRITICAL RESULT CALLED TO, READ BACK BY AND VERIFIED WITH: NATE COOKSON AT 0426 02/27/16 CAF    Acinetobacter baumannii NOT DETECTED NOT DETECTED Final   Enterobacteriaceae species NOT DETECTED NOT DETECTED Final   Enterobacter cloacae complex NOT DETECTED NOT DETECTED Final   Escherichia coli NOT DETECTED NOT DETECTED Final   Klebsiella oxytoca NOT DETECTED NOT DETECTED Final   Klebsiella pneumoniae NOT DETECTED NOT DETECTED Final   Proteus species NOT DETECTED NOT DETECTED Final   Serratia marcescens NOT DETECTED NOT DETECTED Final   Carbapenem resistance NOT DETECTED NOT DETECTED Final   Haemophilus influenzae NOT DETECTED NOT DETECTED Final   Neisseria meningitidis NOT DETECTED NOT DETECTED Final   Pseudomonas aeruginosa NOT DETECTED NOT DETECTED Final   Candida albicans NOT DETECTED NOT DETECTED Final   Candida glabrata NOT DETECTED NOT DETECTED Final   Candida krusei NOT DETECTED NOT DETECTED Final   Candida parapsilosis NOT DETECTED NOT DETECTED Final   Candida tropicalis NOT DETECTED NOT DETECTED Final  Body fluid culture     Status: None   Collection Time: 03/09/2016  3:05 PM  Result Value Ref Range Status   Specimen Description R Knee  Final   Special Requests NONE  Final   Gram Stain   Final    MODERATE WBC SEEN MODERATE GRAM POSITIVE COCCI IN PAIRS AND CHAINS    Culture  Final    HEAVY GROWTH GROUP A STREP (S.PYOGENES) ISOLATED There is no known Penicillin Resistant Beta Streptococcus in the [place] For patients that are Penicillin-allergic, Erythromycin is 85-94% susceptible, and Clindamycin is 80% susceptible.  Contact Microbiology within 7 days if sensitivity testing is  required.   NO ANAEROBES ISOLATED    Report Status 02/29/2016 FINAL  Final  Body fluid culture     Status: None   Collection Time: 03/09/2016  4:10 PM  Result Value Ref Range Status    Specimen Description R Knee  Final   Special Requests NONE  Final   Gram Stain   Final    MANY WBC SEEN MANY GRAM POSITIVE COCCI IN PAIRS AND CHAINS    Culture   Final    HEAVY GROWTH GROUP A STREP (S.PYOGENES) ISOLATED There is no known Penicillin Resistant Beta Streptococcus in the [place] For patients that are Penicillin-allergic, Erythromycin is 85-94% susceptible, and Clindamycin is 80% susceptible.  Contact Microbiology within 7 days if sensitivity testing is  required.   NO ANAEROBES ISOLATED    Report Status 02/29/2016 FINAL  Final  MRSA PCR Screening     Status: None   Collection Time: 02/27/16  3:35 AM  Result Value Ref Range Status   MRSA by PCR NEGATIVE NEGATIVE Final    Comment:        The GeneXpert MRSA Assay (FDA approved for NASAL specimens only), is one component of a comprehensive MRSA colonization surveillance program. It is not intended to diagnose MRSA infection nor to guide or monitor treatment for MRSA infections.   Urine culture     Status: None   Collection Time: 02/27/16  8:30 AM  Result Value Ref Range Status   Specimen Description URINE, RANDOM  Final   Special Requests NONE  Final   Culture NO GROWTH 1 DAY  Final   Report Status 02/28/2016 FINAL  Final  Culture, blood (single) w Reflex to PCR ID Panel     Status: None (Preliminary result)   Collection Time: 02/29/16  2:36 PM  Result Value Ref Range Status   Specimen Description BLOOD LEFT HAND  Final   Special Requests BOTTLES DRAWN AEROBIC AND ANAEROBIC [place]  Final   Culture NO GROWTH 2 DAYS  Final   Report Status PENDING  Incomplete    Studies/Results: No results found.  Assessment/Plan: KRISTA SOM is a 75 y.o. male with Group A strep septic knee (Synovial WBC > 300K), sepsis, hypotensive, intubated, no obvious sourse with no evidence cellulitis and no PNA on cxr.  Echo neg for vegetation Fu bcx 4/5 ngtd Dr Rudene Christians following for the knee - getting PT to  start Recommendations Continue  pcn  Can dc clinda Continue supportive care Discussed with wife.  Thank you very much for the consult. Will follow with you.  Atsushi Yom P   03/02/2016, 3:38 PM

## 2016-03-02 NOTE — Progress Notes (Signed)
[facility] Consultation      Date: 03/02/2016,   MRN# [id number] John Schultz 1941/06/26    AdmissionWeight: 180 lb (81.647 kg)                 CurrentWeight: 195 lb 12.3 oz (88.8 kg)   CHIEF COMPLAINT:   Follow up resp failure   HISTORY OF PRESENT ILLNESS   Remains intubated,sedated On vasopressors  Blood  Cultures and wound cultures Group A strep +POS Patient failed SAT/SBT multiple times due to resp muscle fatigue Will attempt SAT/SBT again today when wife arrives     MEDICATIONS    Home Medication:  No current outpatient prescriptions on file.  Current Medication:  Current facility-administered medications:  .  acetaminophen (TYLENOL) tablet 650 mg, 650 mg, Oral, Q6H PRN **OR** acetaminophen (TYLENOL) suppository 650 mg, 650 mg, Rectal, Q6H PRN, Idelle Crouch, MD .  antiseptic oral rinse solution (CORINZ), 7 mL, Mouth Rinse, QID, [name], MD, 7 mL at 03/02/16 0347 .  aspirin chewable tablet 81 mg, 81 mg, Per Tube, Daily, [name], MD, 81 mg at 03/01/16 1104 .  bisacodyl (DULCOLAX) suppository 10 mg, 10 mg, Rectal, Daily, [name], MD, 10 mg at 03/01/16 1104 .  budesonide (PULMICORT) nebulizer solution 0.5 mg, 0.5 mg, Nebulization, BID, [name], MD, 0.5 mg at 03/01/16 2007 .  chlorhexidine gluconate (SAGE KIT) (PERIDEX) 0.12 % solution 15 mL, 15 mL, Mouth Rinse, BID, [name], MD, 15 mL at 03/01/16 2046 .  clindamycin (CLEOCIN) IVPB 900 mg, 900 mg, Intravenous, 3 times per day, Leonel Ramsay, MD, 900 mg at 03/02/16 0509 .  docusate (COLACE) 50 MG/5ML liquid 100 mg, 100 mg, Per Tube, BID, [name], MD, 100 mg at 03/01/16 2102 .  famotidine (PEPCID) IVPB 20 mg premix, 20 mg, Intravenous, Q12H, [name], MD, 20 mg at 03/01/16 2102 .  feeding supplement (PRO-STAT SUGAR FREE 64) liquid 30 mL, 30 mL, Per Tube, BID, [name], MD, 30 mL at 03/01/16 2100 .  feeding supplement (VITAL AF 1.2 CAL) liquid 1,000 mL, 1,000  mL, Per Tube, Continuous, [name], MD, Last Rate: 55 mL/hr at 03/01/16 1559, 1,000 mL at 03/01/16 1559 .  fentaNYL 2564mg in NS 2516m(1029mml) infusion-PREMIX, 40 mcg/hr, Intravenous, Continuous, KurFlora LippsD, Last Rate: 4 mL/hr at 03/02/16 0448, 40 mcg/hr at 03/02/16 0448 .  free water 100 mL, 100 mL, Per Tube, 3 times per day, KurFlora LippsD, 100 mL at 03/02/16 0613 .  gabapentin (NEURONTIN) tablet 300 mg, 300 mg, Per Tube, QHS, KurFlora LippsD, 300 mg at 03/01/16 2102 .  haloperidol lactate (HALDOL) injection 1 mg, 1 mg, Intravenous, Q6H PRN, RimTheodoro GristD .  heparin ADULT infusion 100 units/mL (25000 units/250 mL), 1,650 Units/hr, Intravenous, Continuous, AllVena RuaPH, Last Rate: 16.5 mL/hr at 03/01/16 2030, 1,650 Units/hr at 03/01/16 2030 .  hydrocortisone sodium succinate (SOLU-CORTEF) 100 MG injection 50 mg, 50 mg, Intravenous, Q6H, KurFlora LippsD, 50 mg at 03/02/16 0441 .  insulin aspart (novoLOG) injection 2-6 Units, 2-6 Units, Subcutaneous, 6 times per day, KurFlora LippsD, Stopped at 03/02/16 0800 .  insulin regular (NOVOLIN R,HUMULIN R) 250 Units in sodium chloride 0.9 % 250 mL (1 Units/mL) infusion, , Intravenous, Continuous, KurFlora LippsD, Last Rate: 11.2 mL/hr at 03/02/16 0704, 11.2 Units/hr at 03/02/16 0704 .  ipratropium-albuterol (DUONEB) 0.5-2.5 (3) MG/3ML nebulizer solution 3 mL, 3 mL, Nebulization, QID, JefIdelle CrouchD, 3 mL at 03/01/16  2007 .  morphine 2 MG/ML injection 2 mg, 2 mg, Intravenous, Q2H PRN, Idelle Crouch, MD, 2 mg at 02/27/16 0041 .  norepinephrine (LEVOPHED) 82m in D5W 2556mpremix infusion, 0-40 mcg/min, Intravenous, Titrated, VaVaughan BastaMD, Last Rate: 30 mL/hr at 03/02/16 0038, 8 mcg/min at 03/02/16 0038 .  ondansetron (ZOFRAN) tablet 4 mg, 4 mg, Oral, Q6H PRN **OR** ondansetron (ZOFRAN) injection 4 mg, 4 mg, Intravenous, Q6H PRN, JeIdelle CrouchMD .  penicillin G potassium 4 Million Units in dextrose 5 % 250 mL  IVPB, 4 Million Units, Intravenous, 4 times per day, KuFlora LippsMD, 4 Million Units at 03/02/16 0604 .  potassium chloride 20 MEQ/15ML (10%) solution 40 mEq, 40 mEq, Per Tube, Once, DaRaylene MiyamotoMD .  pravastatin (PRAVACHOL) tablet 40 mg, 40 mg, Oral, q1800, JeIdelle CrouchMD, 40 mg at 03/01/16 1741 .  propofol (DIPRIVAN) 1000 MG/100ML infusion, 0-50 mcg/kg/min, Intravenous, Continuous, KuFlora LippsMD, Last Rate: 10.2 mL/hr at 03/02/16 0035, 20 mcg/kg/min at 03/02/16 0035 .  sodium chloride flush (NS) 0.9 % injection 10-40 mL, 10-40 mL, Intracatheter, PRN, KuFlora LippsMD .  sodium chloride flush (NS) 0.9 % injection 3 mL, 3 mL, Intravenous, Q12H, JeIdelle CrouchMD, 3 mL at 03/01/16 2106 .  vasopressin (PITRESSIN) 40 Units in sodium chloride 0.9 % 250 mL (0.16 Units/mL) infusion, 0.03 Units/min, Intravenous, Continuous, KuFlora LippsMD, Last Rate: 11.3 mL/hr at 03/01/16 2030, 0.03 Units/min at 03/01/16 2030    ALLERGIES   Tape     REVIEW OF SYSTEMS   Review of Systems  Unable to perform ROS: critical illness     VS: BP 93/48 mmHg  Pulse 77  Temp(Src) 98 F (36.7 C) (Axillary)  Resp 22  Ht 6' (1.829 m)  Wt 195 lb 12.3 oz (88.8 kg)  BMI 26.55 kg/m2  SpO2 98%     PHYSICAL EXAM  Physical Exam  Constitutional: No distress.  HENT:  Head: Normocephalic and atraumatic.  Eyes: Pupils are equal, round, and reactive to light. No scleral icterus.  Neck: Normal range of motion. Neck supple.  Cardiovascular: Normal rate and regular rhythm.   No murmur heard. Pulmonary/Chest: No respiratory distress. He has no wheezes. He has rales.  resp distress  Abdominal: Soft. He exhibits no distension. There is no tenderness.  Musculoskeletal: He exhibits edema.  RT knee with wound dressing  Neurological: He displays normal reflexes. Coordination normal.  gcs<8T  Skin: Skin is warm. No rash noted. He is diaphoretic.        LABS    Recent Labs     02/29/16  0513   02/29/16  1137  03/01/16  0744  HGB  9.2*   --   9.1*  HCT  27.4*   --   27.3*  MCV  90.3   --   91.5  WBC  18.3*   --   15.7*  BUN   --   65*  63*  CREATININE   --   1.91*  2.07*  GLUCOSE   --   173*  209*  CALCIUM   --   6.2*  5.9*  ,     CULTURE RESULTS   Recent Results (from the past 240 hour(s))  Blood culture (routine x 2)     Status: None (Preliminary result)   Collection Time: 03/09/2016 12:59 PM  Result Value Ref Range Status   Specimen Description BLOOD LEFT FOREARM  Final   Special Requests BOTTLES DRAWN AEROBIC  AND ANAEROBIC  1CC  Final   Culture  Setup Time   Final    GRAM POSITIVE COCCI AEROBIC BOTTLE ONLY CRITICAL VALUE NOTED.  VALUE IS CONSISTENT WITH PREVIOUSLY REPORTED AND CALLED VALUE.    Culture STREPTOCOCCUS PYOGENES AEROBIC BOTTLE ONLY   Final   Report Status PENDING  Incomplete   Organism ID, Bacteria STREPTOCOCCUS PYOGENES  Final      Susceptibility   Streptococcus pyogenes - MIC*    CLINDAMYCIN Value in next row Sensitive      SENSITIVE0.25    AMPICILLIN Value in next row Sensitive      SENSITIVE0.25    ERYTHROMYCIN Value in next row Sensitive      SENSITIVE0.12    VANCOMYCIN Value in next row Sensitive      SENSITIVE0.5    CEFTRIAXONE Value in next row Sensitive      SENSITIVE0.12    LEVOFLOXACIN Value in next row Sensitive      SENSITIVE0.5    * STREPTOCOCCUS PYOGENES  Rapid Influenza A&B Antigens ([facility] only)     Status: None   Collection Time: 03/05/2016  1:00 PM  Result Value Ref Range Status   Influenza A ([facility]) NEGATIVE NEGATIVE Final   Influenza B ([facility]) NEGATIVE NEGATIVE Final  Blood culture (routine x 2)     Status: None   Collection Time: 03/13/2016  1:40 PM  Result Value Ref Range Status   Specimen Description BLOOD LEFT FOREARM  Final   Special Requests BOTTLES DRAWN AEROBIC AND ANAEROBIC  4CC  Final   Culture  Setup Time   Final    GRAM POSITIVE COCCI IN BOTH AEROBIC AND ANAEROBIC BOTTLES CRITICAL RESULT CALLED TO, READ BACK  BY AND VERIFIED WITH: NATE COOKSON AT 0426 02/27/16 CAF    Culture   Final    STREPTOCOCCUS PYOGENES IN BOTH AEROBIC AND ANAEROBIC BOTTLES    Report Status 02/29/2016 FINAL  Final   Organism ID, Bacteria STREPTOCOCCUS PYOGENES  Final      Susceptibility   Streptococcus pyogenes - MIC*    CLINDAMYCIN Value in next row Sensitive      SENSITIVE<=0.25    AMPICILLIN Value in next row Sensitive      SENSITIVE<=0.25    ERYTHROMYCIN Value in next row Sensitive      SENSITIVE0.12    VANCOMYCIN Value in next row Sensitive      SENSITIVE0.5    CEFTRIAXONE Value in next row Sensitive      SENSITIVE0.12    LEVOFLOXACIN Value in next row Sensitive      SENSITIVE0.5    * STREPTOCOCCUS PYOGENES  Blood Culture ID Panel (Reflexed)     Status: Abnormal   Collection Time: 03/23/2016  1:40 PM  Result Value Ref Range Status   Enterococcus species NOT DETECTED NOT DETECTED Final   Vancomycin resistance NOT DETECTED NOT DETECTED Final   Listeria monocytogenes NOT DETECTED NOT DETECTED Final   Staphylococcus species NOT DETECTED NOT DETECTED Final   Staphylococcus aureus NOT DETECTED NOT DETECTED Final   Methicillin resistance NOT DETECTED NOT DETECTED Final   Streptococcus species NOT DETECTED NOT DETECTED Final   Streptococcus agalactiae NOT DETECTED NOT DETECTED Final   Streptococcus pneumoniae NOT DETECTED NOT DETECTED Final   Streptococcus pyogenes DETECTED (A) NOT DETECTED Final    Comment: CRITICAL RESULT CALLED TO, READ BACK BY AND VERIFIED WITH: NATE COOKSON AT 0426 02/27/16 CAF    Acinetobacter baumannii NOT DETECTED NOT DETECTED Final   Enterobacteriaceae species NOT DETECTED NOT DETECTED  Final   Enterobacter cloacae complex NOT DETECTED NOT DETECTED Final   Escherichia coli NOT DETECTED NOT DETECTED Final   Klebsiella oxytoca NOT DETECTED NOT DETECTED Final   Klebsiella pneumoniae NOT DETECTED NOT DETECTED Final   Proteus species NOT DETECTED NOT DETECTED Final   Serratia marcescens NOT  DETECTED NOT DETECTED Final   Carbapenem resistance NOT DETECTED NOT DETECTED Final   Haemophilus influenzae NOT DETECTED NOT DETECTED Final   Neisseria meningitidis NOT DETECTED NOT DETECTED Final   Pseudomonas aeruginosa NOT DETECTED NOT DETECTED Final   Candida albicans NOT DETECTED NOT DETECTED Final   Candida glabrata NOT DETECTED NOT DETECTED Final   Candida krusei NOT DETECTED NOT DETECTED Final   Candida parapsilosis NOT DETECTED NOT DETECTED Final   Candida tropicalis NOT DETECTED NOT DETECTED Final  Body fluid culture     Status: None   Collection Time: 03/11/2016  3:05 PM  Result Value Ref Range Status   Specimen Description R Knee  Final   Special Requests NONE  Final   Gram Stain   Final    MODERATE WBC SEEN MODERATE GRAM POSITIVE COCCI IN PAIRS AND CHAINS    Culture   Final    HEAVY GROWTH GROUP A STREP (S.PYOGENES) ISOLATED There is no known Penicillin Resistant Beta Streptococcus in the [place] For patients that are Penicillin-allergic, Erythromycin is 85-94% susceptible, and Clindamycin is 80% susceptible.  Contact Microbiology within 7 days if sensitivity testing is  required.   NO ANAEROBES ISOLATED    Report Status 02/29/2016 FINAL  Final  Body fluid culture     Status: None   Collection Time: 03/06/2016  4:10 PM  Result Value Ref Range Status   Specimen Description R Knee  Final   Special Requests NONE  Final   Gram Stain   Final    MANY WBC SEEN MANY GRAM POSITIVE COCCI IN PAIRS AND CHAINS    Culture   Final    HEAVY GROWTH GROUP A STREP (S.PYOGENES) ISOLATED There is no known Penicillin Resistant Beta Streptococcus in the [place] For patients that are Penicillin-allergic, Erythromycin is 85-94% susceptible, and Clindamycin is 80% susceptible.  Contact Microbiology within 7 days if sensitivity testing is  required.   NO ANAEROBES ISOLATED    Report Status 02/29/2016 FINAL  Final  MRSA PCR Screening     Status: None   Collection Time: 02/27/16  3:35 AM  Result  Value Ref Range Status   MRSA by PCR NEGATIVE NEGATIVE Final    Comment:        The GeneXpert MRSA Assay (FDA approved for NASAL specimens only), is one component of a comprehensive MRSA colonization surveillance program. It is not intended to diagnose MRSA infection nor to guide or monitor treatment for MRSA infections.   Urine culture     Status: None   Collection Time: 02/27/16  8:30 AM  Result Value Ref Range Status   Specimen Description URINE, RANDOM  Final   Special Requests NONE  Final   Culture NO GROWTH 1 DAY  Final   Report Status 02/28/2016 FINAL  Final  Culture, blood (single) w Reflex to PCR ID Panel     Status: None (Preliminary result)   Collection Time: 02/29/16  2:36 PM  Result Value Ref Range Status   Specimen Description BLOOD LEFT HAND  Final   Special Requests BOTTLES DRAWN AEROBIC AND ANAEROBIC [place]  Final   Culture NO GROWTH 2 DAYS  Final   Report Status PENDING  Incomplete  IMAGING    Dg Chest 2 View  03/05/2016  CLINICAL DATA:  Nausea, vomiting, diarrhea and cough. Also weakness, congestion, fever and chills. History of CHF, diabetes. EXAM: CHEST  2 VIEW COMPARISON:  Chest x-rays dated 04/24/2015 and 03/20/2015. FINDINGS: Study is again hypoinspiratory with crowding of the perihilar bronchovascular markings. Given the low lung volumes, lungs appear clear. Cardiomediastinal silhouette is stable in size and configuration. Median sternotomy wires are stable in alignment. Multiple old compression fracture deformities are seen within the upper lumbar spine, 1 of which is status post previous vertebroplasty. No evidence of acute osseous abnormality. Surgical clips again noted within the upper abdomen. IMPRESSION: Low lung volumes. No evidence of acute cardiopulmonary abnormality. No evidence of pneumonia. Electronically Signed   By: Franki Cabot M.D.   On: 03/19/2016 13:40   Dg Abd 1 View  02/27/2016  CLINICAL DATA:  Nasogastric tube  placement EXAM: ABDOMEN - 1 VIEW COMPARISON:  02/27/2016 at 17:08 FINDINGS: Nasogastric tube is incompletely imaged but it extends into the stomach and appears to curl up into the fundus although the tip is off the superior edge of the image. Visible abdominal gas pattern is grossly unremarkable. IMPRESSION: Nasogastric tube reaches the stomach, tip is probably in the fundus. Electronically Signed   By: Andreas Newport M.D.   On: 02/27/2016 22:02   Dg Abd 1 View  02/27/2016  CLINICAL DATA:  OG tube placement EXAM: ABDOMEN - 1 VIEW COMPARISON:  Chest x-ray same day FINDINGS: There is normal small bowel gas pattern. Surgical clips are noted in right and left upper abdomen. Prior vertebroplasty noted upper lumbar spine. The patient is status post median sternotomy. No NG tube is identified. Partially visualized endotracheal tube with tip about 3 cm above the carina. Right IJ central line is unchanged in position. IMPRESSION: The patient is status post median sternotomy. No NG tube is identified. Clinical correlation is necessary. Partially visualized endotracheal tube with tip about 3 cm above the carina. Right IJ central line is unchanged in position. I discussed with patient's nurse in ICU, Diane Electronically Signed   By: Lahoma Crocker M.D.   On: 02/27/2016 17:47   Dg Chest Port 1 View  02/27/2016  CLINICAL DATA:  Endotracheal tube placement, respiratory failure. EXAM: PORTABLE CHEST 1 VIEW COMPARISON:  Same day. FINDINGS: Stable cardiomediastinal silhouette. Status post coronary artery bypass graft. Endotracheal tube is seen in grossly good position projected over tracheal air shadow with distal tip 3 cm above the carina. Atherosclerosis of thoracic aorta is noted. Hypoinflation of the lungs is noted. Mild bibasilar subsegmental atelectasis is noted with associated left pleural effusion. No pneumothorax is noted. Right internal jugular catheter is unchanged in position. IMPRESSION: Endotracheal tube in grossly  good position. Hypoinflation of the lungs is noted. Stable bibasilar subsegmental atelectasis with associated mild left pleural effusion. Electronically Signed   By: Marijo Conception, M.D.   On: 02/27/2016 16:23   Dg Chest Port 1 View  02/27/2016  CLINICAL DATA:  Central line placement.  Initial encounter. EXAM: PORTABLE CHEST 1 VIEW COMPARISON:  Chest radiograph performed earlier today at 3:52 a.m. FINDINGS: A right IJ line is noted ending about the mid to distal SVC. The lungs are hypoexpanded. Bibasilar airspace opacities may reflect atelectasis or possibly pneumonia. Mild vascular crowding is noted. Small bilateral pleural effusions are suspected. No pneumothorax is seen. The cardiomediastinal silhouette is mildly enlarged. The patient is status post median sternotomy, with evidence of prior CABG. No acute osseous abnormalities  are identified. Scattered clips are noted about the upper abdomen. IMPRESSION: 1. Right IJ line noted ending about the mid to distal SVC. 2. Lungs hypoexpanded. Bibasilar airspace opacities may reflect atelectasis or possibly pneumonia. 3. Suspect small bilateral pleural effusions.  Mild cardiomegaly. Electronically Signed   By: Garald Balding M.D.   On: 02/27/2016 05:57   Dg Chest Port 1 View  02/27/2016  CLINICAL DATA:  Acute onset of respiratory failure. Initial encounter. EXAM: PORTABLE CHEST 1 VIEW COMPARISON:  Chest radiograph performed 03/17/2016 FINDINGS: The lungs are hypoexpanded. Bibasilar airspace opacities may reflect atelectasis or pneumonia. No definite pleural effusion or pneumothorax is seen. The cardiomediastinal silhouette is borderline normal in size. The patient is status post median sternotomy, with evidence of prior CABG. Scattered clips are noted at the upper abdomen. No acute osseous abnormalities are identified. IMPRESSION: Lungs hypoexpanded. Bibasilar airspace opacities may reflect atelectasis or pneumonia. Electronically Signed   By: Garald Balding M.D.    On: 02/27/2016 04:04   Dg Knee Complete 4 Views Right  03/06/2016  CLINICAL DATA:  Acute knee pain.  Initial encounter. EXAM: RIGHT KNEE - COMPLETE 4+ VIEW COMPARISON:  None. FINDINGS: There is no evidence of acute fracture, subluxation or dislocation. A small knee effusion is present. No focal bony lesions are present. Heavy vascular calcifications are present. IMPRESSION: Small knee effusion without acute bony abnormality. Heavy vascular calcifications. Electronically Signed   By: Margarette Canada M.D.   On: 03/13/2016 13:39        ASSESSMENT/PLAN    Indwelling Urinary Catheter continued, requirement due to   Reason to continue Indwelling Urinary Catheter for strict Intake/Output monitoring for hemodynamic instability   Central Line continued, requirement due to      INDWELLING DEVICES:: RT IJ CVL 4/3>>> ETT 8.0 4/3>>>  MICRO DATA: MRSA PCR negative Urine  Blood>> group A strep Resp  WOUND YO:VZCHY A strep ANTIMICROBIALS: Zosyn and Vancomycin>>4/3 Clindamycin 4/3>>>>> PCN 4/3>>>>   ASSESSMENT/PLAN  75 yo white male admitted to ICU for acute septic shock with resp distress from severe acidosis and septic arthritis-GROUP A STREP Bacteremia  Patient at high risk for cardiac arrest and death, intubated for acute resp failure complicated by NSTEMI and ARF  PULMONARY 1.Respiratory Failure-multiple failed  SAT/SBT's will try again this AM when wife arrives -continue Full MV support -continue Bronchodilator Therapy -Wean Fio2 and PEEP as tolerated   CARDIOVASCULAR-NSTEMI Septic shock -use vasopressors to keep MAP>65 -follow ABG and LA as needed -follow ID recs -may consdier stress dose steroids -follow up cardiology recs    RENAL Follow chem 7 -ARF from ATN-cont IVFs, check UO  GASTROINTESTINAL Started TF's  HEMATOLOGIC Follow CBC  INFECTIOUS Infected RT knee -abx as prescribed -follow ortho recs -follow ID recs  NEUROLOGIC encphalopathy from  acidosis Sedation-RAS goal -1 -wean sedation and assess neuro status    I have personally obtained a history, examined the patient, evaluated laboratory and independently reviewed imaging results, formulated the assessment and plan and placed orders.  The Patient requires high complexity decision making for assessment and support, frequent evaluation and titration of therapies, application of advanced monitoring technologies and extensive interpretation of multiple databases. Critical Care Time devoted to patient care services described in this note is 35 minutes.   Overall, patient is critically ill, prognosis is guarded. Patient at high risk for cardiac arrest and death.    Corrin Parker, M.D.  [facility]  Medical Director [place] Director [facility]  Department

## 2016-03-02 NOTE — Progress Notes (Signed)
[place]  SUBJECTIVE: intubated and sedated   Filed Vitals:   03/02/16 0700 03/02/16 0800 03/02/16 0900 03/02/16 1000  BP: 89/47 99/47 104/47 107/52  Pulse: 77 74 85 97  Temp:    98.4 F (36.9 C)  TempSrc:    Oral  Resp: 15 19 22 21   Height:      Weight:      SpO2: 99% 98% 100% 97%    Intake/Output Summary (Last 24 hours) at 03/02/16 1106 Last data filed at 03/02/16 0906  Gross per 24 hour  Intake 2891.79 ml  Output    620 ml  Net 2271.79 ml    LABS: Basic Metabolic Panel:  Recent Labs  03/01/16 0744 03/02/16 0740  NA 127* 120*  K 3.7 4.1  CL 100* 94*  CO2 19* 15*  GLUCOSE 209* 444*  BUN 63* 78*  CREATININE 2.07* 2.09*  CALCIUM 5.9* 5.7*  MG 1.7 2.2  PHOS 3.1 5.3*   Liver Function Tests:  Recent Labs  02/29/16 0513  ALBUMIN 1.5*   No results for input(s): LIPASE, AMYLASE in the last 72 hours. CBC:  Recent Labs  03/01/16 0744 03/02/16 0740  WBC 15.7* 12.8*  HGB 9.1* 8.2*  HCT 27.3* 24.6*  MCV 91.5 90.9  PLT 178 148*   Cardiac Enzymes: No results for input(s): CKTOTAL, CKMB, CKMBINDEX, TROPONINI in the last 72 hours. BNP: Invalid input(s): POCBNP D-Dimer: No results for input(s): DDIMER in the last 72 hours. Hemoglobin A1C: No results for input(s): HGBA1C in the last 72 hours. Fasting Lipid Panel: No results for input(s): CHOL, HDL, LDLCALC, TRIG, CHOLHDL, LDLDIRECT in the last 72 hours. Thyroid Function Tests: No results for input(s): TSH, T4TOTAL, T3FREE, THYROIDAB in the last 72 hours.  Invalid input(s): FREET3 Anemia Panel: No results for input(s): VITAMINB12, FOLATE, FERRITIN, TIBC, IRON, RETICCTPCT in the last 72 hours.   Physical Exam: Blood pressure 107/52, pulse 97, temperature 98.4 F (36.9 C), temperature source Oral, resp. rate 21, height 6' (1.829 m), weight 88.8 kg (195 lb 12.3 oz), SpO2 97 %.   Wt Readings from Last 1 Encounters:  03/01/16 88.8 kg (195 lb 12.3 oz)     General  appearance: intuated and sedated Resp: breathing with vent Cardio: regular rate and rhythm GI: normal findings: bowel sounds normal Neurologic: Mental status: intubated and sedated  TELEMETRY: Reviewed telemetry pt in sr with lbbb:  ASSESSMENT AND PLAN:  Principal Problem:   Septic arthritis of knee, right (HCC)-being followed by ortho and intensivists Active Problems:   ARF (acute renal failure) (HCC)-creatinine 2.09, slighly up from yesterday.    Acute bronchitis   Hyponatremia   NSTEMI (non-ST elevated myocardial infarction) (HCC)-ok to stop heprin. Would use prophylactic enoxaparin. Defer any invasive cardiac work up pending recovery from sepsis   Pressure ulcer   Septic shock (HCC)-still on pressors and abx   Acute respiratory failure (HCC)-wean attempts.     Teodoro Spray., MD, [facility] 03/02/2016 11:06 AM

## 2016-03-02 NOTE — Progress Notes (Signed)
E link notified of pt Na+ 124, and Ca+ 5.4.  Pharmacy already put orders in to replace.   E link called at 1735 notified of pt having low output today. Bladder scanner showed no urine being retained.  Total output for today is 200 cc.   Will continue to assess.

## 2016-03-02 NOTE — Progress Notes (Signed)
ANTICOAGULATION CONSULT NOTE - Follow up [place] for heparin Indication: chest pain/ACS  Allergies  Allergen Reactions  . Tape Rash    Patient Measurements: Height: 6' (182.9 cm) Weight: 195 lb 12.3 oz (88.8 kg) IBW/kg (Calculated) : 77.6 Heparin Dosing Weight: 81.6 kg  Vital Signs: Temp: 97.1 F (36.2 C) (04/07 0014) Temp Source: Axillary (04/07 0014) BP: 90/70 mmHg (04/07 0500) Pulse Rate: 84 (04/07 0500)  Labs:  Recent Labs  02/29/16 0513  02/29/16 1137 03/01/16 0744 03/01/16 1634 03/02/16 0233  HGB 9.2*  --   --  9.1*  --   --   HCT 27.4*  --   --  27.3*  --   --   PLT 177  --   --  178  --   --   HEPARINUNFRC  --   < >  --  0.29* 0.38 0.57  CREATININE  --   --  1.91* 2.07*  --   --   < > = values in this interval not displayed.  Estimated Creatinine Clearance: 34.9 mL/min (by C-G formula based on Cr of 2.07).   Medical History: Past Medical History  Diagnosis Date  . CHF (congestive heart failure) ([place])   . DM (diabetes mellitus) ([place])   . Pancreatic cyst   . Skin cancer   . DDD (degenerative disc disease), thoracic   . Renal failure   . Respiratory failure ([facility])     Medications:  Infusions:  . feeding supplement (VITAL AF 1.2 CAL) 1,000 mL (03/01/16 1559)  . fentaNYL infusion INTRAVENOUS 40 mcg/hr (03/02/16 0448)  . heparin 1,650 Units/hr (03/01/16 2030)  . insulin (NOVOLIN-R) infusion 3.3 Units/hr (03/02/16 0501)  . norepinephrine 8 mcg/min (03/02/16 0038)  . propofol (DIPRIVAN) infusion 20 mcg/kg/min (03/02/16 0035)  . vasopressin (PITRESSIN) infusion - *FOR SHOCK* 0.03 Units/min (03/01/16 2030)    Assessment: 75 yom with rising troponin, pharmacy consulted to dose heparin for ACS/NSTEMI.  Goal of Therapy:  Heparin level 0.3-0.7 units/ml Monitor platelets by anticoagulation protocol: Yes   Plan:  4/4 PM heparin level 0.31. Recheck with AM labs to confirm.  4/5 AM heparin level 0.30. Continue current regimen. Recheck  with CBC tomorrow AM.   4/6 AM HL 0.30. As patient has been hovering around the lower limit of therapeutic, will bump up rate slightly to 1650 units/hr (an increase by 100 units per hr). Will recheck level in 8 hrs.    4/6 PM: Heparin level therapeutic at 0.38. CCU RN confirms heparin drip running at 1650 units/hr; no line issues or s/sx of bleeding noted. Will continue heparin drip at current rate. Will recheck anti-Xa level in 8h to confirm and CBC in AM. Hgb and plt count stable from yesterday.   4/7 02:30 heparin level 0.57. Continue current regimen. Recheck with CBC tomorrow AM.  Pharmacy will continue to follow.   Rayna Sexton, PharmD, BCPS Clinical Pharmacist 03/02/2016 5:12 AM

## 2016-03-02 NOTE — Progress Notes (Signed)
Inpatient Diabetes Program Recommendations  AACE/ADA: New Consensus Statement on Inpatient Glycemic Control (2015)  Target Ranges:  Prepandial:   less than 140 mg/dL      Peak postprandial:   less than 180 mg/dL (1-2 hours)      Critically ill patients:  140 - 180 mg/dL   Review of Glycemic Control  Diabetes history: DM2 Outpatient Diabetes medications: Lantus 15 units QHS, Novolog 5 units TID with meals, Metformin XR 1500 mg QPM with supper Current orders for Inpatient glycemic control:Phase 2 Adult ICU Glycemic Control order set    Inpatient Diabetes Program Recommendations: Patient failed coming off the insulin drip yesterday because he was not given basal insulin 2 hours before the insulin drip was stopped.    Transition patient when the following are met:  the insulin infusion rate is < 4 units / hour  tube feeds are at goal rate and stable   there are 6 subsequent CBG readings < 180 mg/dL   Please follow the protocol to establish Lantus basal rate , correction rate and the tube feed coverage (if it is restarted) when transitioning from phase 2 to phase 3.   Gentry Fitz, RN, BA, MHA, CDE Diabetes Coordinator Inpatient Diabetes Program  [phone] (Team Pager) [phone] ([place]) 03/02/2016 9:13 AM

## 2016-03-02 NOTE — Progress Notes (Signed)
Nutrition Follow-up    INTERVENTION:   EN: With current diprivan, recommend continuing current TF regimen. Free water flushes discontinued per MD order due to hyponatremia.Continue to assess   NUTRITION DIAGNOSIS:   Inadequate oral intake related to acute illness as evidenced by NPO status. Being addressed via TF  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, respiratory distress with severe acidosis requiring intubation on 02/27/16  Patient is currently intubated on ventilator support MV: 12 L/min Temp (24hrs), Avg:98.1 F (36.7 C), Min:97.1 F (36.2 C), Max:98.9 F (37.2 C)  Diprivan: 237 kcals in past 24 hours  Diet Order:  Diet NPO time specified Except for: Sips with Meds  Skin:  Reviewed, no issues  Last BM:  03/02/16   Recent Labs Lab 02/27/16 0331  02/29/16 1137 03/01/16 0744 03/02/16 0740  NA 134*  < > 132* 127* 120*  K 3.9  < > 3.5 3.7 4.1  CL 100*  < > 105 100* 94*  CO2 22  < > 20* 19* 15*  BUN 78*  < > 65* 63* 78*  CREATININE 2.59*  < > 1.91* 2.07* 2.09*  CALCIUM 7.7*  < > 6.2* 5.9* 5.7*  MG 1.8  --   --  1.7 2.2  PHOS  --   --   --  3.1 5.3*  GLUCOSE 351*  < > 173* 209* 444*  < > = values in this interval not displayed.  Meds: insulin drip restarted, diprivan, levophed, vasopressin   Height:   Ht Readings from Last 1 Encounters:  02/28/16 6' (1.829 m)    Weight:   Wt Readings from Last 1 Encounters:  03/01/16 195 lb 12.3 oz (88.8 kg)   Filed Weights   02/28/16 0415 02/29/16 0500 03/01/16 0512  Weight: 186 lb 11.7 oz (84.7 kg) 187 lb 6.3 oz (85 kg) 195 lb 12.3 oz (88.8 kg)    BMI:  Body mass index is 26.55 kg/(m^2).  Estimated Nutritional Needs:   Kcal:  2085 kcals (BEE 1625, Ve: 11, Tmax: 38.3) using  wt of 84.7 kg  Protein:  128-170 g (1.5-2.0 g/kg)   Fluid:  >2 Liters  EDUCATION NEEDS:   Education needs no appropriate at this time  [place], [place], [place] [phone] Pager  [phone] Weekend/On-Call Pager

## 2016-03-02 NOTE — Care Management (Signed)
Discussed during progression that anticipate will successfully extubate within the next several days.  Bicarb drip for sodium of 120.  Palliative care involvement not indicated.  Unsure at present if patient will require long term antibiotics

## 2016-03-02 NOTE — Progress Notes (Signed)
Patient remains intubated on the vent, levo decreased to 53mcg, BP remains WDL. Vasopressin continues, Insulin gtt restarted per MD order. Further assessment unchanged. See CHL for further assessment. Will continue to assess for changes/need.

## 2016-03-02 NOTE — Plan of Care (Signed)
Problem: Education: Goal: Knowledge of [facility] Education information/materials will improve Outcome: Completed/Met Date Met:  03/02/16 Pt wife given welcome packet.Questions regarding  plan of care, medications, and tests being preformed were answered. Pain management was discussed with pt wife. No further questions at this time.

## 2016-03-02 NOTE — Progress Notes (Signed)
Per Dr.Kasa, keep pt on insulin drip, do not transition to Phase 3 at this time.

## 2016-03-02 NOTE — Progress Notes (Signed)
Subjective:  Patient known to our practice from outpatient. He is followed for CKD st 3 Admitted for septic rt knee.  Also Dx with NSTEMI  Intubated 4/3 Currently on ventilator support UOP 745 cc Insulin, Levophed, Propofol, fentanyl Serum sodium is noted to be worsening    Objective:  Vital signs in last 24 hours:  Temp:  [97.1 F (36.2 C)-98.9 F (37.2 C)] 98.4 F (36.9 C) (04/07 1000) Pulse Rate:  [72-97] 97 (04/07 1000) Resp:  [15-24] 21 (04/07 1000) BP: (89-116)/(44-70) 107/52 mmHg (04/07 1000) SpO2:  [97 %-100 %] 97 % (04/07 1000) FiO2 (%):  [30 %] 30 % (04/07 0809)  Weight change:  Filed Weights   02/28/16 0415 02/29/16 0500 03/01/16 0512  Weight: 84.7 kg (186 lb 11.7 oz) 85 kg (187 lb 6.3 oz) 88.8 kg (195 lb 12.3 oz)    Intake/Output:    Intake/Output Summary (Last 24 hours) at 03/02/16 1029 Last data filed at 03/02/16 0906  Gross per 24 hour  Intake 3111.74 ml  Output    620 ml  Net 2491.74 ml     Physical Exam: General: Critically ill appearing, lethargic  HEENT ETT  Neck supple  Pulm/lungs Vent assisted  CVS/Heart Regular, soft systolic murmur  Abdomen:  Soft, non distended  Extremities: No peripheral edema, rt knee drain    Neurologic: Sedated,    Skin: Heel skin breakdown    Foley       Basic Metabolic Panel:   Recent Labs Lab 02/27/16 0331 02/28/16 0431 02/29/16 1137 03/01/16 0744 03/02/16 0740  NA 134* 135 132* 127* 120*  K 3.9 3.1* 3.5 3.7 4.1  CL 100* 107 105 100* 94*  CO2 22 19* 20* 19* 15*  GLUCOSE 351* 221* 173* 209* 444*  BUN 78* 73* 65* 63* 78*  CREATININE 2.59* 2.19* 1.91* 2.07* 2.09*  CALCIUM 7.7* 6.4* 6.2* 5.9* 5.7*  MG 1.8  --   --  1.7 2.2  PHOS  --   --   --  3.1 5.3*     CBC:  Recent Labs Lab 02/27/16 0331 02/28/16 0431 02/29/16 0513 03/01/16 0744 03/02/16 0740  WBC 11.2* 15.2* 18.3* 15.7* 12.8*  HGB 10.9* 9.7* 9.2* 9.1* 8.2*  HCT 33.0* 29.1* 27.4* 27.3* 24.6*  MCV 92.2 93.4 90.3 91.5 90.9  PLT  158 184 177 178 148*      Microbiology:  Recent Results (from the past 720 hour(s))  Blood culture (routine x 2)     Status: None (Preliminary result)   Collection Time: 03/23/2016 12:59 PM  Result Value Ref Range Status   Specimen Description BLOOD LEFT FOREARM  Final   Special Requests BOTTLES DRAWN AEROBIC AND ANAEROBIC  1CC  Final   Culture  Setup Time   Final    GRAM POSITIVE COCCI AEROBIC BOTTLE ONLY CRITICAL VALUE NOTED.  VALUE IS CONSISTENT WITH PREVIOUSLY REPORTED AND CALLED VALUE.    Culture STREPTOCOCCUS PYOGENES AEROBIC BOTTLE ONLY   Final   Report Status PENDING  Incomplete   Organism ID, Bacteria STREPTOCOCCUS PYOGENES  Final      Susceptibility   Streptococcus pyogenes - MIC*    CLINDAMYCIN Value in next row Sensitive      SENSITIVE0.25    AMPICILLIN Value in next row Sensitive      SENSITIVE0.25    ERYTHROMYCIN Value in next row Sensitive      SENSITIVE0.12    VANCOMYCIN Value in next row Sensitive      SENSITIVE0.5    CEFTRIAXONE  Value in next row Sensitive      SENSITIVE0.12    LEVOFLOXACIN Value in next row Sensitive      SENSITIVE0.5    * STREPTOCOCCUS PYOGENES  Rapid Influenza A&B Antigens ([facility] only)     Status: None   Collection Time: 03/13/2016  1:00 PM  Result Value Ref Range Status   Influenza A ([place]) NEGATIVE NEGATIVE Final   Influenza B ([facility]) NEGATIVE NEGATIVE Final  Blood culture (routine x 2)     Status: None   Collection Time: 03/01/2016  1:40 PM  Result Value Ref Range Status   Specimen Description BLOOD LEFT FOREARM  Final   Special Requests BOTTLES DRAWN AEROBIC AND ANAEROBIC  4CC  Final   Culture  Setup Time   Final    GRAM POSITIVE COCCI IN BOTH AEROBIC AND ANAEROBIC BOTTLES CRITICAL RESULT CALLED TO, READ BACK BY AND VERIFIED WITH: [place] AT 1829 02/27/16 CAF    Culture   Final    STREPTOCOCCUS PYOGENES IN BOTH AEROBIC AND ANAEROBIC BOTTLES    Report Status 02/29/2016 FINAL  Final   Organism ID, Bacteria STREPTOCOCCUS  PYOGENES  Final      Susceptibility   Streptococcus pyogenes - MIC*    CLINDAMYCIN Value in next row Sensitive      SENSITIVE<=0.25    AMPICILLIN Value in next row Sensitive      SENSITIVE<=0.25    ERYTHROMYCIN Value in next row Sensitive      SENSITIVE0.12    VANCOMYCIN Value in next row Sensitive      SENSITIVE0.5    CEFTRIAXONE Value in next row Sensitive      SENSITIVE0.12    LEVOFLOXACIN Value in next row Sensitive      SENSITIVE0.5    * STREPTOCOCCUS PYOGENES  Blood Culture ID Panel (Reflexed)     Status: Abnormal   Collection Time: 03/04/2016  1:40 PM  Result Value Ref Range Status   Enterococcus species NOT DETECTED NOT DETECTED Final   Vancomycin resistance NOT DETECTED NOT DETECTED Final   Listeria monocytogenes NOT DETECTED NOT DETECTED Final   Staphylococcus species NOT DETECTED NOT DETECTED Final   Staphylococcus aureus NOT DETECTED NOT DETECTED Final   Methicillin resistance NOT DETECTED NOT DETECTED Final   Streptococcus species NOT DETECTED NOT DETECTED Final   Streptococcus agalactiae NOT DETECTED NOT DETECTED Final   Streptococcus pneumoniae NOT DETECTED NOT DETECTED Final   Streptococcus pyogenes DETECTED (A) NOT DETECTED Final    Comment: CRITICAL RESULT CALLED TO, READ BACK BY AND VERIFIED WITH: NATE COOKSON AT 0426 02/27/16 CAF    Acinetobacter baumannii NOT DETECTED NOT DETECTED Final   Enterobacteriaceae species NOT DETECTED NOT DETECTED Final   Enterobacter cloacae complex NOT DETECTED NOT DETECTED Final   Escherichia coli NOT DETECTED NOT DETECTED Final   Klebsiella oxytoca NOT DETECTED NOT DETECTED Final   Klebsiella pneumoniae NOT DETECTED NOT DETECTED Final   Proteus species NOT DETECTED NOT DETECTED Final   Serratia marcescens NOT DETECTED NOT DETECTED Final   Carbapenem resistance NOT DETECTED NOT DETECTED Final   Haemophilus influenzae NOT DETECTED NOT DETECTED Final   Neisseria meningitidis NOT DETECTED NOT DETECTED Final   Pseudomonas  aeruginosa NOT DETECTED NOT DETECTED Final   Candida albicans NOT DETECTED NOT DETECTED Final   Candida glabrata NOT DETECTED NOT DETECTED Final   Candida krusei NOT DETECTED NOT DETECTED Final   Candida parapsilosis NOT DETECTED NOT DETECTED Final   Candida tropicalis NOT DETECTED NOT DETECTED Final  Body fluid  culture     Status: None   Collection Time: 03/04/2016  3:05 PM  Result Value Ref Range Status   Specimen Description R Knee  Final   Special Requests NONE  Final   Gram Stain   Final    MODERATE WBC SEEN MODERATE GRAM POSITIVE COCCI IN PAIRS AND CHAINS    Culture   Final    HEAVY GROWTH GROUP A STREP (S.PYOGENES) ISOLATED There is no known Penicillin Resistant Beta Streptococcus in the [place] For patients that are Penicillin-allergic, Erythromycin is 85-94% susceptible, and Clindamycin is 80% susceptible.  Contact Microbiology within 7 days if sensitivity testing is  required.   NO ANAEROBES ISOLATED    Report Status 02/29/2016 FINAL  Final  Body fluid culture     Status: None   Collection Time: 02/25/2016  4:10 PM  Result Value Ref Range Status   Specimen Description R Knee  Final   Special Requests NONE  Final   Gram Stain   Final    MANY WBC SEEN MANY GRAM POSITIVE COCCI IN PAIRS AND CHAINS    Culture   Final    HEAVY GROWTH GROUP A STREP (S.PYOGENES) ISOLATED There is no known Penicillin Resistant Beta Streptococcus in the [place] For patients that are Penicillin-allergic, Erythromycin is 85-94% susceptible, and Clindamycin is 80% susceptible.  Contact Microbiology within 7 days if sensitivity testing is  required.   NO ANAEROBES ISOLATED    Report Status 02/29/2016 FINAL  Final  MRSA PCR Screening     Status: None   Collection Time: 02/27/16  3:35 AM  Result Value Ref Range Status   MRSA by PCR NEGATIVE NEGATIVE Final    Comment:        The GeneXpert MRSA Assay (FDA approved for NASAL specimens only), is one component of a comprehensive MRSA  colonization surveillance program. It is not intended to diagnose MRSA infection nor to guide or monitor treatment for MRSA infections.   Urine culture     Status: None   Collection Time: 02/27/16  8:30 AM  Result Value Ref Range Status   Specimen Description URINE, RANDOM  Final   Special Requests NONE  Final   Culture NO GROWTH 1 DAY  Final   Report Status 02/28/2016 FINAL  Final  Culture, blood (single) w Reflex to PCR ID Panel     Status: None (Preliminary result)   Collection Time: 02/29/16  2:36 PM  Result Value Ref Range Status   Specimen Description BLOOD LEFT HAND  Final   Special Requests BOTTLES DRAWN AEROBIC AND ANAEROBIC [place]  Final   Culture NO GROWTH 2 DAYS  Final   Report Status PENDING  Incomplete    Coagulation Studies: No results for input(s): LABPROT, INR in the last 72 hours.  Urinalysis: No results for input(s): COLORURINE, LABSPEC, PHURINE, GLUCOSEU, HGBUR, BILIRUBINUR, KETONESUR, PROTEINUR, UROBILINOGEN, NITRITE, LEUKOCYTESUR in the last 72 hours.  Invalid input(s): APPERANCEUR    Imaging: No results found.   Medications:   . feeding supplement (VITAL AF 1.2 CAL) 1,000 mL (03/01/16 1559)  . fentaNYL infusion INTRAVENOUS 10 mcg/hr (03/02/16 0913)  . insulin (NOVOLIN-R) infusion 16.5 Units/hr (03/02/16 0906)  . norepinephrine 3 mcg/min (03/02/16 0912)  . propofol (DIPRIVAN) infusion 5 mcg/kg/min (03/02/16 0906)  . vasopressin (PITRESSIN) infusion - *FOR SHOCK* 0.03 Units/min (03/01/16 2030)   . antiseptic oral rinse  7 mL Mouth Rinse QID  . aspirin  81 mg Per Tube Daily  . bisacodyl  10 mg Rectal Once  .  budesonide (PULMICORT) nebulizer solution  0.5 mg Nebulization BID  . chlorhexidine gluconate (SAGE KIT)  15 mL Mouth Rinse BID  . clindamycin (CLEOCIN) IV  900 mg Intravenous 3 times per day  . docusate  100 mg Per Tube BID  . famotidine (PEPCID) IV  20 mg Intravenous Q12H  . feeding supplement (PRO-STAT SUGAR FREE 64)  30 mL Per  Tube BID  . gabapentin  300 mg Per Tube QHS  . heparin subcutaneous  5,000 Units Subcutaneous 3 times per day  . hydrocortisone sod succinate (SOLU-CORTEF) inj  50 mg Intravenous Q6H  . ipratropium-albuterol  3 mL Nebulization QID  . pencillin G potassium IV  4 Million Units Intravenous 4 times per day  . potassium chloride  40 mEq Per Tube Once  . pravastatin  40 mg Oral q1800  . sodium chloride flush  3 mL Intravenous Q12H   acetaminophen **OR** acetaminophen, haloperidol lactate, morphine injection, ondansetron **OR** ondansetron (ZOFRAN) IV, sodium chloride flush  Assessment/ Plan:  75 y.o. Caucasian male  with significant medical history of coronary disease with four-vessel CABG in 1998, gallbladder rupture, extensive surgery, Diabetes with complications of retinopathy, peripheral neuropathy, peripheral vascular disease, Aorto iliac bypass, angioplasty and stent in his left leg, kyphoplasty for chronic back pain, CKD st 3 with Baseline Cr 1.5/ GFR 46  1. 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 Rt Knee septic arthritis 3. NSTEMI 4. DM-2 with CKD 5. Hypotension 6. Acute resp faliure. Intubated 4/3 7. Hypokalemia 8. Hyponatremia  Plan:  Continue volume optimization.  UOP 745 cc With sodium level worsening, agree with discontinuing pre-water flushes Try to makes medications in saline instead of D 5 Critical care team planning on starting bicarbonate replacement No acute indication for dialysis.  Will follow   LOS: 5 John Schultz 4/7/201710:29 AM

## 2016-03-02 NOTE — Evaluation (Signed)
Physical Therapy Evaluation Patient Details Name: John Schultz MRN: [id number] DOB: 06-14-1941 Today's Date: 03/02/2016   History of Present Illness  Pt is a 75 y.o. male with PMH of HTN, CHF, CKD, diabetes and thoracic DDD (s/p kyphoplasty), s/p median sternotomy.  Pt presented with R knee pain, fever, SOB and cough.  Pt was admitted septic arthritis (02/29/2016).  Pt s/p R knee aspiration 03/07/2016.  Pt was initially admitted to the general floor.  pt was transferred to ICU due to septic shock (02-27-16).  Pt was intubated (02-27-16), displayed an NSTEMI (02-27-16) and was sedated on 02-27-16.        Clinical Impression  Prior to admission pt was independent with quad cane.  Pt lives with wife, son and son's family.  Pt's history was obtained from pt's wife.  Pt was assessed and PROM was performed for B dorsiflexion, knee flexion and extension, hip flexion, and hip internal and external rotation PROM (see LE assessment for details).  Pt's wife was educated on LE PROM technique and repositioning techniques and demonstrated and verbalized good understanding.  Due to aforementioned function and strength deficits, pt is in need of skilled physical therapy.  It is recommended that pt is discharged to SNF (pending progress) when medically appropriate.     Follow Up Recommendations SNF (pending progress)    Equipment Recommendations   (TBD upon further assessment )    Recommendations for Other Services       Precautions / Restrictions Precautions Precautions: Other (comment) Precaution Comments: mechanical ventilation, elevate HOB >30 degrees, R IJ central line, nasogastric tube Restrictions Weight Bearing Restrictions: No      Mobility  Bed Mobility               General bed mobility comments: not assessed due to sedation   Transfers                    Ambulation/Gait                Stairs            Wheelchair Mobility    Modified Rankin (Stroke Patients  Only)       Balance                                             Pertinent Vitals/Pain Pain Assessment: Faces Faces Pain Scale: Hurts little more Pain Location: when moving R knee  Pain Intervention(s): Limited activity within patient's tolerance;Monitored during session;Premedicated before session;Repositioned  See flow sheet for vitals.     Home Living Family/patient expects to be discharged to:: Private residence Living Arrangements: Children;Spouse/significant other Available Help at Discharge: Family   Home Access: Stairs to enter   Technical brewer of Steps: 3 Home Layout: Two level (pt lives on first floor ) Home Equipment: Cane - quad Additional Comments: History obtained from pt's wife     Prior Function Level of Independence: Independent with assistive device(s) (quad cane )         Comments: pt's wife reported that he had one fall in the last 6 months      Hand Dominance        Extremity/Trunk Assessment   Upper Extremity Assessment: Generalized weakness           Lower Extremity Assessment: Generalized weakness    Dosiflexion: R: -8; L: -3  degrees Knee flexion: R: 105; L: 120 degrees Knee Extension: R: -14; L: -9 degrees Hip Internal rotation (knee partially extended, hip flexed): R: slightly before neutral; L: Slightly past neutral  Hip external rotation (knee partially extended, hip flexed): B WFL  Hip flexion: R: 110; L: 120 degrees  All measurement taken supine in bed.    Cervical / Trunk Assessment: Normal  Communication   Communication: Other (comment) (intubated and sedated )  Cognition Arousal/Alertness: Lethargic   Overall Cognitive Status: Difficult to assess (intubation and sedation)                      General Comments   Nursing was contacted and cleared pt for physical therapy.  Session was modified due to sedation.  Pt's wife was present during the entire session.  PT was consulted to  prevent stiffness and maintain ROM.      Exercises Total Joint Exercises Knee Flexion: PROM;Both General Exercises - Lower Extremity Ankle Circles/Pumps: PROM;Both Knee extension: PROM; both  Hip flexion: PROM; both Hip internal rotation: PROM; both  All performed supine in bed.       Assessment/Plan    PT Assessment Patient needs continued PT services  PT Diagnosis Generalized weakness   PT Problem List Decreased strength;Decreased range of motion;Decreased activity tolerance;Decreased mobility;Pain  PT Treatment Interventions DME instruction;Gait training;Stair training;Functional mobility training;Therapeutic activities;Therapeutic exercise;Patient/family education   PT Goals (Current goals can be found in the Care Plan section) Acute Rehab PT Goals Patient Stated Goal: to have better mobility  PT Goal Formulation: With family Time For Goal Achievement: 03/16/16 Potential to Achieve Goals: Fair    Frequency Min 2X/week   Barriers to discharge        Co-evaluation               End of Session Equipment Utilized During Treatment: Oxygen (intubation) Activity Tolerance: Patient limited by lethargy Patient left: in bed;with nursing/sitter in room Nurse Communication: Precautions         Time: [id number] PT Time Calculation (min) (ACUTE ONLY): 24 min   Charges:         PT G Codes:       Mittie Bodo, SPT Mittie Bodo 03/02/2016, 4:42 PM

## 2016-03-02 NOTE — Plan of Care (Signed)
Problem: Safety: Goal: Ability to remain free from injury will improve Outcome: Progressing Pt bed in low position, Bed alarm is on at this time. Pt's wife verbally expressed understanding how to use the call light when husband required help.

## 2016-03-02 NOTE — Progress Notes (Signed)
Received call from [place], [place], regarding critical lab value Ca 5.7 (lab correct to 7.4) and Na 124 and notified eMD Dr. Oletta Darter.

## 2016-03-03 ENCOUNTER — Inpatient Hospital Stay: Payer: Commercial Managed Care - HMO

## 2016-03-03 DIAGNOSIS — N179 Acute kidney failure, unspecified: Secondary | ICD-10-CM

## 2016-03-03 LAB — BASIC METABOLIC PANEL
ANION GAP: 12 (ref 5–15)
ANION GAP: 13 (ref 5–15)
BUN: 89 mg/dL — AB (ref 6–20)
BUN: 87 mg/dL — ABNORMAL HIGH (ref 6–20)
CALCIUM: 5.7 mg/dL — AB (ref 8.9–10.3)
CHLORIDE: 91 mmol/L — AB (ref 101–111)
CO2: 20 mmol/L — AB (ref 22–32)
CO2: 20 mmol/L — AB (ref 22–32)
CREATININE: 2.1 mg/dL — AB (ref 0.61–1.24)
Calcium: 5.4 mg/dL — CL (ref 8.9–10.3)
Chloride: 92 mmol/L — ABNORMAL LOW (ref 101–111)
Creatinine, Ser: 2.03 mg/dL — ABNORMAL HIGH (ref 0.61–1.24)
GFR calc Af Amer: 34 mL/min — ABNORMAL LOW (ref >60)
GFR calc Af Amer: 36 mL/min — ABNORMAL LOW (ref >60)
GFR calc non Af Amer: 31 mL/min — ABNORMAL LOW (ref >60)
GFR, EST NON AFRICAN AMERICAN: 30 mL/min — AB (ref >60)
GLUCOSE: 157 mg/dL — AB (ref 65–99)
Glucose, Bld: 177 mg/dL — ABNORMAL HIGH (ref 65–99)
POTASSIUM: 3.2 mmol/L — AB (ref 3.5–5.1)
Potassium: 3.5 mmol/L (ref 3.5–5.1)
Sodium: 124 mmol/L — ABNORMAL LOW (ref 135–145)
Sodium: 124 mmol/L — ABNORMAL LOW (ref 135–145)

## 2016-03-03 LAB — HEMOGLOBIN AND HEMATOCRIT, BLOOD
HCT: 21.2 % — ABNORMAL LOW (ref 40.0–52.0)
Hemoglobin: 7.4 g/dL — ABNORMAL LOW (ref 13.0–18.0)

## 2016-03-03 LAB — CULTURE, BLOOD (ROUTINE X 2)

## 2016-03-03 LAB — MAGNESIUM: Magnesium: 2.2 mg/dL (ref 1.7–2.4)

## 2016-03-03 LAB — GLUCOSE, CAPILLARY
GLUCOSE-CAPILLARY: 124 mg/dL — AB (ref 65–99)
GLUCOSE-CAPILLARY: 137 mg/dL — AB (ref 65–99)
GLUCOSE-CAPILLARY: 144 mg/dL — AB (ref 65–99)
GLUCOSE-CAPILLARY: 145 mg/dL — AB (ref 65–99)
GLUCOSE-CAPILLARY: 145 mg/dL — AB (ref 65–99)
GLUCOSE-CAPILLARY: 146 mg/dL — AB (ref 65–99)
GLUCOSE-CAPILLARY: 156 mg/dL — AB (ref 65–99)
GLUCOSE-CAPILLARY: 170 mg/dL — AB (ref 65–99)
GLUCOSE-CAPILLARY: 172 mg/dL — AB (ref 65–99)
GLUCOSE-CAPILLARY: 174 mg/dL — AB (ref 65–99)
GLUCOSE-CAPILLARY: 199 mg/dL — AB (ref 65–99)
Glucose-Capillary: 124 mg/dL — ABNORMAL HIGH (ref 65–99)
Glucose-Capillary: 130 mg/dL — ABNORMAL HIGH (ref 65–99)
Glucose-Capillary: 138 mg/dL — ABNORMAL HIGH (ref 65–99)
Glucose-Capillary: 144 mg/dL — ABNORMAL HIGH (ref 65–99)
Glucose-Capillary: 150 mg/dL — ABNORMAL HIGH (ref 65–99)
Glucose-Capillary: 150 mg/dL — ABNORMAL HIGH (ref 65–99)
Glucose-Capillary: 151 mg/dL — ABNORMAL HIGH (ref 65–99)
Glucose-Capillary: 154 mg/dL — ABNORMAL HIGH (ref 65–99)
Glucose-Capillary: 157 mg/dL — ABNORMAL HIGH (ref 65–99)
Glucose-Capillary: 161 mg/dL — ABNORMAL HIGH (ref 65–99)
Glucose-Capillary: 172 mg/dL — ABNORMAL HIGH (ref 65–99)
Glucose-Capillary: 193 mg/dL — ABNORMAL HIGH (ref 65–99)
Glucose-Capillary: 193 mg/dL — ABNORMAL HIGH (ref 65–99)

## 2016-03-03 LAB — BLOOD GAS, ARTERIAL
Acid-base deficit: 0.8 mmol/L (ref 0.0–2.0)
Allens test (pass/fail): POSITIVE — AB
BICARBONATE: 22.6 meq/L (ref 21.0–28.0)
FIO2: 0.3
O2 Saturation: 96.5 %
PATIENT TEMPERATURE: 37
PEEP/CPAP: 5 cmH2O
PH ART: 7.47 — AB (ref 7.350–7.450)
PO2 ART: 80 mmHg — AB (ref 83.0–108.0)
PRESSURE SUPPORT: 10 cmH2O
pCO2 arterial: 31 mmHg — ABNORMAL LOW (ref 32.0–48.0)

## 2016-03-03 LAB — CBC
HEMATOCRIT: 21.8 % — AB (ref 40.0–52.0)
Hemoglobin: 7.4 g/dL — ABNORMAL LOW (ref 13.0–18.0)
MCH: 29.9 pg (ref 26.0–34.0)
MCHC: 34 g/dL (ref 32.0–36.0)
MCV: 87.9 fL (ref 80.0–100.0)
PLATELETS: 139 10*3/uL — AB (ref 150–440)
RBC: 2.49 MIL/uL — ABNORMAL LOW (ref 4.40–5.90)
RDW: 14 % (ref 11.5–14.5)
WBC: 11.8 10*3/uL — AB (ref 3.8–10.6)

## 2016-03-03 LAB — OCCULT BLOOD X 1 CARD TO LAB, STOOL: Fecal Occult Bld: POSITIVE — AB

## 2016-03-03 LAB — PHOSPHORUS: Phosphorus: 4.5 mg/dL (ref 2.5–4.6)

## 2016-03-03 MED ORDER — SODIUM CHLORIDE 0.9 % IV SOLN
1.0000 g | Freq: Once | INTRAVENOUS | Status: AC
  Administered 2016-03-03: 1 g via INTRAVENOUS
  Filled 2016-03-03: qty 10

## 2016-03-03 MED ORDER — PENICILLIN G POTASSIUM 5000000 UNITS IJ SOLR
Freq: Four times a day (QID) | INTRAMUSCULAR | Status: DC
  Administered 2016-03-03 – 2016-03-04 (×3): via INTRAVENOUS
  Filled 2016-03-03 (×6): qty 250

## 2016-03-03 MED ORDER — PENICILLIN G POTASSIUM 5000000 UNITS IJ SOLR: 4.0000 10*6.[IU] | Freq: Four times a day (QID) | INTRAVENOUS | Status: DC

## 2016-03-03 MED ORDER — POTASSIUM CHLORIDE 20 MEQ/15ML (10%) PO SOLN
20.0000 meq | ORAL | Status: AC
  Administered 2016-03-03 (×2): 20 meq
  Filled 2016-03-03 (×2): qty 15

## 2016-03-03 NOTE — Clinical Social Work Note (Signed)
Clinical Social Work Assessment  Patient Details  Name: John Schultz MRN: [id number] Date of Birth: [birth year]  Date of referral:  03/03/16               Reason for consult:  Facility Placement, Discharge Planning                Permission sought to share information with:  Family Supports, Customer service manager Permission granted to share information::  Yes, Verbal Permission Granted  Name::     John Schultz [phone]   Agency::  SNF  Relationship::  self  Contact Information:  yes  Housing/Transportation Living arrangements for the past 2 months:  Single Family Home Source of Information:  Spouse Patient Interpreter Needed:  None Criminal Activity/Legal Involvement Pertinent to Current Situation/Hospitalization:  No - Comment as needed Significant Relationships:  Spouse, Adult Children Lives with:    Wife and adult son and daughter in Law Do you feel safe going back to the place where you live?  Yes Need for family participation in patient care:  Yes (Comment)  Care giving concerns: Wife reports she would like to see his infection managed and him breathing on his own.    Social Worker assessment / plan:  LCSW met with wife John Schultz and discussed future after healthcare options.  SNF  List was provided and verbal consent was given to send out  Request for bed offers to SNF in the [place] area. Before patient came to ICU he was mobile and only required minimal assistance. He was ambulatory and used a cane. He was able to dress and bath and feed himself. He has CHF,CKD,diabetes,hypertension they are planning to dialysate him to help fight the  infection. He is currently on ventilator and is sedated. Patient was oriented to person ,place and situation prior to sedation. He is a full code. His insurance is medicare/humana gold plus HMO. HCPOA will consult with family members to discuss future health care options.  Employment status:  Retired Forensic scientist:     PT Recommendations:  Not assessed at this time Information / Referral to community resources:  [place]  Patient/Family's Response to care: They are optimistic he is improving and feel the dialysis machine will assist with reducing the infection in patients body  Patient/Family's Understanding of and Emotional Response to Diagnosis, Current Treatment, and Prognosis:  [place] family consulting with several specialist daily and are a great support to each other  Emotional Assessment Appearance:  Appears stated age Attitude/Demeanor/Rapport:  Unable to Assess Affect (typically observed):  Unable to Assess Orientation:  Oriented to Self, Oriented to Place, Oriented to  Time, Oriented to Situation Alcohol / Substance use:  Never Used Psych involvement (Current and /or in the community):  No (Comment)  Discharge Needs  Concerns to be addressed:  Discharge Planning Concerns Readmission within the last 30 days:  No Current discharge risk:  None Barriers to Discharge:  Continued Medical Work up   John Reamer, LCSW 03/03/2016, 4:08 PM

## 2016-03-03 NOTE — Progress Notes (Signed)
RT placed patient on SBT 10/5 per verbal order from Dr. Stevenson Clinch.  Patient became agitated and Dr. Stevenson Clinch ordered STAT ABG and EKG due to rhythm changes on monitor.  Patient placed back on previous vent settings of PRVC 620, 18, 30%, +5 at [id number] without complication.  Patient appears in less distress at this time, will continue to monitor.

## 2016-03-03 NOTE — Progress Notes (Addendum)
Performed sedation vacation. Patient tolerated spontaneous mode for about 30 minutes. Dr. Augustina Mood made aware of patient becoming tachypneic in the 30s and heart rhythm showing wide QRS complex. EKG was ordered and he was shown to have A Fib with RVR. Per Dr. Augustina Mood, turn patient back to controlled rate on the ventilator and keep sedated. Marland Kitchen

## 2016-03-03 NOTE — NC FL2 (Signed)
[place] LEVEL OF CARE SCREENING TOOL     IDENTIFICATION  Patient Name: John Schultz Birthdate: 04/02/1941 Sex: male Admission Date (Current Location): 03/21/2016  [place] and [place] Number:  Engineering geologist and Address:  [facility], [address], [place], [place] [address]      Provider Number: [id number]  Attending Physician Name and Address:  [name], MD  Relative Name and Phone Number:       Current Level of Care: Hospital Recommended Level of Care: [place] Prior Approval Number:    Date Approved/Denied:   PASRR Number:  [id number]  Discharge Plan: SNF    Current Diagnoses: Patient Active Problem List   Diagnosis Date Noted  . NSTEMI (non-ST elevated myocardial infarction) ([place]) 02/27/2016  . Pressure ulcer 02/27/2016  . Septic shock ([place])   . Acute respiratory failure ([place])   . Septic arthritis of knee, right ([place]) 03/25/2016  . ARF (acute renal failure) ([place]) 03/13/2016  . Acute bronchitis 03/05/2016  . Hyponatremia 03/09/2016    Orientation RESPIRATION BLADDER Height & Weight     Self, Time, Situation, Place  Vent Continent Weight: 223 lb 12.3 oz (101.5 kg) Height:  6' (182.9 cm)  BEHAVIORAL SYMPTOMS/MOOD NEUROLOGICAL BOWEL NUTRITION STATUS      Continent Diet (Diabetic)  AMBULATORY STATUS COMMUNICATION OF NEEDS Skin   Supervision Verbally Normal                       Personal Care Assistance Level of Assistance  Bathing, Feeding, Dressing, Total care Bathing Assistance: Limited assistance Feeding assistance: Limited assistance Dressing Assistance: Limited assistance Total Care Assistance: Maximum assistance   Functional Limitations Info  Sight, Hearing, Speech Sight Info: Adequate Hearing Info: Adequate Speech Info: Adequate    SPECIAL CARE FACTORS FREQUENCY                       Contractures Contractures Info: Not present    Additional Factors Info   Code Status, Allergies Code Status Info: Full Allergies Info: Tape           Current Medications (03/03/2016):  This is the current hospital active medication list Current Facility-Administered Medications  Medication Dose Route Frequency Provider Last Rate Last Dose  . acetaminophen (TYLENOL) tablet 650 mg  650 mg Oral Q6H PRN Idelle Crouch, MD       Or  . acetaminophen (TYLENOL) suppository 650 mg  650 mg Rectal Q6H PRN Idelle Crouch, MD      . antiseptic oral rinse solution (CORINZ)  7 mL Mouth Rinse QID [name], MD   7 mL at 03/03/16 1529  . aspirin chewable tablet 81 mg  81 mg Per Tube Daily [name], MD   81 mg at 03/03/16 1119  . budesonide (PULMICORT) nebulizer solution 0.5 mg  0.5 mg Nebulization BID [name], MD   0.5 mg at 03/03/16 0925  . calcium gluconate 1 g in sodium chloride 0.9 % 100 mL IVPB  1 g Intravenous Once Vishal Mungal, MD   1 g at 03/03/16 1529  . chlorhexidine gluconate (SAGE KIT) (PERIDEX) 0.12 % solution 15 mL  15 mL Mouth Rinse BID [name], MD   15 mL at 03/03/16 0800  . famotidine (PEPCID) IVPB 20 mg premix  20 mg Intravenous Q12H [name], MD   20 mg at 03/03/16 1119  . feeding supplement (PRO-STAT SUGAR FREE 64) liquid 30 mL  30 mL Per Tube BID [name], MD   30 mL at 03/03/16 1119  . feeding supplement (VITAL AF 1.2 CAL) liquid 1,000 mL  1,000 mL Per Tube Continuous [name], MD 55 mL/hr at 03/02/16 1600 1,000 mL at 03/02/16 1600  . fentaNYL 2552mg in NS 2540m(1034mml) infusion-PREMIX  40 mcg/hr Intravenous Continuous KurFlora LippsD 4 mL/hr at 03/03/16 1230 40 mcg/hr at 03/03/16 1230  . gabapentin (NEURONTIN) tablet 300 mg  300 mg Per Tube QHS KurFlora LippsD   300 mg at 03/02/16 2204  . haloperidol lactate (HALDOL) injection 1 mg  1 mg Intravenous Q6H PRN RimTheodoro GristD      . heparin injection 5,000 Units  5,000 Units Subcutaneous 3 times per day KurFlora LippsD   5,000 Units at 03/03/16 1528  . hydrocortisone sodium  succinate (SOLU-CORTEF) 100 MG injection 50 mg  50 mg Intravenous Q6H KurFlora LippsD   50 mg at 03/03/16 1528  . insulin regular (NOVOLIN R,HUMULIN R) 250 Units in sodium chloride 0.9 % 250 mL (1 Units/mL) infusion   Intravenous Continuous KurFlora LippsD 7.2 mL/hr at 03/03/16 1558 7.2 Units/hr at 03/03/16 1558  . ipratropium-albuterol (DUONEB) 0.5-2.5 (3) MG/3ML nebulizer solution 3 mL  3 mL Nebulization QID JefIdelle CrouchD   3 mL at 03/03/16 1522  . morphine 2 MG/ML injection 2 mg  2 mg Intravenous Q2H PRN JefIdelle CrouchD   2 mg at 02/27/16 0041  . norepinephrine (LEVOPHED) 4 mg in sodium chloride 0.9 % 250 mL (0.016 mg/mL) infusion  0-40 mcg/min Intravenous Titrated Vishal Mungal, MD 7.5 mL/hr at 03/03/16 1158 2 mcg/min at 03/03/16 1158  . ondansetron (ZOFRAN) tablet 4 mg  4 mg Oral Q6H PRN JefIdelle CrouchD       Or  . ondansetron (ZOFsc Investments LLCnjection 4 mg  4 mg Intravenous Q6H PRN JefIdelle CrouchD      . penicillin G potassium 4 Million Units in dextrose 5 % 250 mL IVPB  4 Million Units Intravenous 4 times per day KurFlora LippsD   4 Million Units at 03/03/16 1224  . potassium chloride 20 MEQ/15ML (10%) solution 20 mEq  20 mEq Per Tube Q2H KurFlora LippsD   20 mEq at 03/03/16 1528  . pravastatin (PRAVACHOL) tablet 40 mg  40 mg Oral q1800 JefIdelle CrouchD   40 mg at 03/02/16 1733  . propofol (DIPRIVAN) 1000 MG/100ML infusion  0-50 mcg/kg/min Intravenous Continuous KurFlora LippsD 10.2 mL/hr at 03/03/16 1230 20 mcg/kg/min at 03/03/16 1230  . sodium bicarbonate 150 mEq in sterile water 1,000 mL infusion   Intravenous Continuous KurFlora LippsD 100 mL/hr at 03/03/16 0802    . sodium chloride flush (NS) 0.9 % injection 10-40 mL  10-40 mL Intracatheter PRN KurFlora LippsD      . sodium chloride flush (NS) 0.9 % injection 3 mL  3 mL Intravenous Q12H JefIdelle CrouchD   3 mL at 03/03/16 0900  . vasopressin (PITRESSIN) 40 Units in sodium chloride 0.9 % 250 mL (0.16 Units/mL)  infusion  0.03 Units/min Intravenous Continuous KurFlora LippsD 11.3 mL/hr at 03/03/16 0933 0.03 Units/min at 03/03/16 [id number]  Discharge Medications: Please see discharge summary for a list of discharge medications.  Relevant Imaging Results:  Relevant Lab Results:   Additional Information SSN # [id number] ReamerCSSouth [place]

## 2016-03-03 NOTE — Consult Note (Signed)
MEDICATION RELATED CONSULT NOTE - Follow up  Pharmacy Consult for Electrolyte Management Indication: Hypokalemia  Allergies  Allergen Reactions  . Tape Rash    Patient Measurements: Height: 6' (182.9 cm) Weight: 223 lb 12.3 oz (101.5 kg) IBW/kg (Calculated) : 77.6  Vital Signs: Temp: 98.5 F (36.9 C) (04/08 1200) Temp Source: Axillary (04/08 1200) BP: 110/50 mmHg (04/08 1300) Pulse Rate: 83 (04/08 1300) Intake/Output from previous day: 04/07 0701 - 04/08 0700 In: 6096.8 [I.V.:3101.8; PM:[id number]; IV Piggyback:1510] Out: 375 [Urine:375] Intake/Output from this shift: Total I/O In: 27.2 [I.V.:27.2] Out: 400 [Urine:400]  Labs:  Recent Labs  03/01/16 0744 03/02/16 0740 03/02/16 1542 03/03/16 0506 03/03/16 1049  WBC 15.7* 12.8*  --  11.8*  --   HGB 9.1* 8.2*  --  7.4* 7.4*  HCT 27.3* 24.6*  --  21.8* 21.2*  PLT 178 148*  --  139*  --   CREATININE 2.07* 2.09* 2.14* 2.10* 2.03*  MG 1.7 2.2  --  2.2  --   PHOS 3.1 5.3*  --  4.5  --    Lab Results  Component Value Date   K 3.2* 03/03/2016   Lab Results  Component Value Date   CALCIUM 5.4* 03/03/2016   PHOS 4.5 03/03/2016   Estimated Creatinine Clearance: 40 mL/min (by C-G formula based on Cr of 2.03).  Assessment: RM is a 75 yo male admitted for NSTEMI. Pharmacy consulted to manage electrolytes in this patient.  Plan:  K= 3.2, mag= 2.2. Phos=4.5.  Ca= 5.4  Albumin (02/29/16)= 1.5  Corrected Ca= 7.4. MD replaced calcium with 1 g Ca gluconate iv once 4/8 . Will give KCL po 30meq x q2h x 2 doses.  Will f/u AM labs.   Chinita [place] PharmD Clinical Pharmacist 03/03/2016

## 2016-03-03 NOTE — Progress Notes (Signed)
Pt's hemoglobin at 7.4. M. Patria Mane, NP made aware.  Per NP, get occult stool sample.  Also, made NP aware of calcium level of 5.7.  No new orders given.

## 2016-03-03 NOTE — Progress Notes (Signed)
LCSW obtained a PASSR # updated the Fl2 and sent out SNF bed request for future placement.  [employer] LCSW [phone]

## 2016-03-03 NOTE — Progress Notes (Addendum)
PULMONARY / CRITICAL CARE MEDICINE   Name: John Schultz MRN: [id number] DOB: 12/16/1940    ADMISSION DATE:  03/05/2016  BRIEF HISTORY: 75 year old male past medical history of hypertension, chronic diastolic heart failure, CK, diabetes, presented on April 2 with four-day history of progressive right knee pain fever, shortness of breath, cough. Within 24 hours decompensated respiratory-wise requiring intubation on 04/3.  Seen by orthopedics, had drain placed in right knee with purulent material. Now with mechanical ventilation and slowly been weaned off.  SUBJECTIVE:  No acute events overnight, noted to have drop in hemoglobin, now with positive Hemoccult. STUDIES:  4/4 Echo> EF 60-65%, nl sys fcn  SIGNIFICANT EVENTS: 4/3-4 day history of right knee pain no trauma prior 4 4/3-aspiration of R Knee, with drainage placement in the ER by ortho 4/4-altered mental status, worsening respiratory status, septic, intubated 4/4- R knee hemovac removed by ortho  VITAL SIGNS: Temp:  [98.1 F (36.7 C)-98.7 F (37.1 C)] 98.2 F (36.8 C) (04/08 0800) Pulse Rate:  [67-92] 92 (04/08 1000) Resp:  [14-23] 22 (04/08 1000) BP: (78-128)/(40-58) 126/52 mmHg (04/08 1000) SpO2:  [98 %-100 %] 98 % (04/08 1000) FiO2 (%):  [30 %] 30 % (04/08 0928) Weight:  [223 lb 12.3 oz (101.5 kg)] 223 lb 12.3 oz (101.5 kg) (04/08 0500) HEMODYNAMICS: CVP:  [10 mmHg-13 mmHg] 11 mmHg VENTILATOR SETTINGS: Vent Mode:  [-] PRVC FiO2 (%):  [30 %] 30 % Set Rate:  [22 bmp] 22 bmp Vt Set:  [500 mL-620 mL] 620 mL PEEP:  [5 cmH20] 5 cmH20 Plateau Pressure:  [18 cmH20-25 cmH20] 18 cmH20 INTAKE / OUTPUT:  Intake/Output Summary (Last 24 hours) at 03/03/16 1032 Last data filed at 03/03/16 1022  Gross per 24 hour  Intake 5304.86 ml  Output    375 ml  Net 4929.86 ml    Review of Systems  Unable to perform ROS: intubated    Physical Exam  Constitutional: He is well-developed, well-nourished, and in no distress.  HENT:   Head: Normocephalic and atraumatic.  Right Ear: External ear normal.  Left Ear: External ear normal.  Eyes: Pupils are equal, round, and reactive to light.  Neck: Normal range of motion. Neck supple.  Cardiovascular: Normal rate, regular rhythm and normal heart sounds.   No murmur heard. Pulmonary/Chest:  Intubated, agitated on vent  Abdominal: Soft. Bowel sounds are normal. He exhibits no distension.  Musculoskeletal:  Rknee with mild erythema - no drain present  Neurological:  Confused/agitated on the vent during sedation vacation  Skin: Skin is warm and dry.  Nursing note and vitals reviewed.    LABS:  CBC  Recent Labs Lab 03/01/16 0744 03/02/16 0740 03/03/16 0506  WBC 15.7* 12.8* 11.8*  HGB 9.1* 8.2* 7.4*  HCT 27.3* 24.6* 21.8*  PLT 178 148* 139*   Coag's  Recent Labs Lab 02/27/16 0339  APTT 34  INR 1.40   BMET  Recent Labs Lab 03/02/16 0740 03/02/16 1542 03/03/16 0506  NA 120* 124* 124*  K 4.1 3.5 3.5  CL 94* 96* 92*  CO2 15* 17* 20*  BUN 78* 81* 89*  CREATININE 2.09* 2.14* 2.10*  GLUCOSE 444* 209* 157*   Electrolytes  Recent Labs Lab 03/01/16 0744 03/02/16 0740 03/02/16 1542 03/03/16 0506  CALCIUM 5.9* 5.7* 5.7* 5.7*  MG 1.7 2.2  --  2.2  PHOS 3.1 5.3*  --  4.5   Sepsis Markers  Recent Labs Lab 02/27/16 0339 02/27/16 1200 02/29/16 0513 03/02/16 0740 03/02/16 [place]  2.2* 1.3  --   --  1.3  PROCALCITON 22.50  --  16.65 7.12  --    ABG  Recent Labs Lab 02/27/16 0423 02/27/16 1646 03/02/16 0921  PHART 7.34* 7.34* 7.26*  PCO2ART 43 35 34  PO2ART 93 92 90   Liver Enzymes  Recent Labs Lab 02/27/16 0055 02/29/16 0513  AST 51*  --   ALT 21  --   ALKPHOS 58  --   BILITOT 1.4*  --   ALBUMIN 2.4* 1.5*   Cardiac Enzymes  Recent Labs Lab 03/25/2016 1914 02/27/16 0055 02/27/16 0832  TROPONINI 2.90* 7.28* 8.10*   Glucose  Recent Labs Lab 03/03/16 0501 03/03/16 0606 03/03/16 0707 03/03/16 0808  03/03/16 0907 03/03/16 1004  GLUCAP 154* 172* 199* 174* 193* 172*    Imaging Dg Chest Port 1 View  03/03/2016  CLINICAL DATA:  Ventilator dependent respiratory failure. Followup basilar atelectasis. EXAM: PORTABLE CHEST 1 VIEW COMPARISON:  02/27/2016 and earlier. FINDINGS: Endotracheal tube tip in satisfactory position projecting approximately 4 cm above the carina. Right jugular central venous catheter tip projects over the mid SVC, unchanged. Nasogastric tube looped in the stomach with its tip in the fundus. Markedly suboptimal inspiration with atelectasis in the lung bases, left greater than right, unchanged since the examination 5 days ago. Prior sternotomy CABG. Cardiac silhouette moderately enlarged, unchanged. Pulmonary venous hypertension, increased since the prior examination, without overt edema currently. No new pulmonary parenchymal abnormalities. IMPRESSION: 1. Support apparatus satisfactory. 2. Markedly suboptimal inspiration with bibasilar atelectasis, left greater than right, stable since the examination 5 days ago. 3. Stable cardiomegaly. Pulmonary venous hypertension, increased since the examination 5 days ago, without overt edema currently. Electronically Signed   By: Evangeline Dakin M.D.   On: 03/03/2016 09:35    INDWELLING DEVICES:: RT IJ CVL 4/3>>> ETT 8.0 4/3>>>  MICRO DATA: MRSA PCR negative Urine  Blood 4/2>> Strep pyogenes Blood 4/5>> No growth to date Resp  R knee WOUND [id number] A strep ANTIMICROBIALS:  Zosyn and Vancomycin>>4/3 Clindamycin 4/3>4/7 PCN G 4/3>>>>  ASSESSMENT / PLAN:  75 yo white male admitted to ICU for acute septic shock with resp distress from severe acidosis and septic arthritis-GROUP A STREP Bacteremia  Patient at high risk for cardiac arrest and death, intubated for acute resp failure complicated by NSTEMI and ARF  PULMONARY 1.Respiratory Failure-multiple failed SAT/SBT's. Tried SAT/SBT noted to have tachypnea, and widening QRS on  monitor, placed back on PRVC.  -check BMP/EKG/ABG -continue Full MV support -continue Bronchodilator Therapy -Wean Fio2 and PEEP as tolerated -abg reviewed, 7.47/31, decreased rate to 18   CARDIOVASCULAR-NSTEMI Septic shock -use vasopressors to keep MAP>65 -follow ABG and LA as needed -follow ID recs -stress dose steroids -follow up cardiology recs    RENAL Follow chem 7 -ARF from ATN-cont IVFs, check UO - low UOP - nephro following for ARF, may conisder HD in the next 1-2 days, not acute indication for emergent HD   GASTROINTESTINAL Diarrhea - lactulose, stopped, check KUB Cont with TF's Hemoccult positive with anemia - check KUB, recheck H/H   HEMATOLOGIC Hemoccult positive Follow CBC Transfuse for Hb <7  INFECTIOUS Infected RT knee -abx as prescribed -follow ortho recs -follow ID recs  ENDO Hyperglycemia - on insulin gtt, bicarb gtt in sterile H20, cont with monitor CBG  NEUROLOGIC encphalopathy from acidosis Sedation-RAS goal -1 -wean sedation and assess neuro status  SOCIAL  - wife at bedside updated on plan of care  Thank you for consulting   Pulmonary and Critical Care, Please feel free to contacts [place] with any questions at [phone] (please enter 7-digits).  I have personally obtained a history, examined the patient, evaluated laboratory and imaging results, formulated the assessment and plan and placed orders.  The Patient requires high complexity decision making for assessment and support, frequent evaluation and titration of therapies, application of advanced monitoring technologies and extensive interpretation of multiple databases. Critical Care Time devoted to patient care services described in this note is 50 minutes.   Overall, patient is critically ill, prognosis is guarded. Patient at high risk for cardiac arrest and death.    Vilinda Boehringer, MD [facility] Pager [phone] (please enter 7-digits) On  Call Pager [phone] (please enter 7-digits)  Note: This note was prepared with Dragon dictation along with smaller phrase technology. Any transcriptional errors that result from this process are unintentional.

## 2016-03-03 NOTE — Progress Notes (Signed)
Subjective:  Urine output worse over the past 24 hours. Currently 3 and 75 cc over the preceding 24 hours. BUN up to 89, creatinine about the same at 2.1. Serum sodium also stable at 124. Remains on the ventilator.    Objective:  Vital signs in last 24 hours:  Temp:  [98.1 F (36.7 C)-98.7 F (37.1 C)] 98.2 F (36.8 C) (04/08 0800) Pulse Rate:  [67-92] 92 (04/08 1000) Resp:  [14-23] 22 (04/08 1000) BP: (78-128)/(40-58) 126/52 mmHg (04/08 1000) SpO2:  [98 %-100 %] 98 % (04/08 1000) FiO2 (%):  [30 %] 30 % (04/08 0928) Weight:  [101.5 kg (223 lb 12.3 oz)] 101.5 kg (223 lb 12.3 oz) (04/08 0500)  Weight change:  Filed Weights   02/29/16 0500 03/01/16 0512 03/03/16 0500  Weight: 85 kg (187 lb 6.3 oz) 88.8 kg (195 lb 12.3 oz) 101.5 kg (223 lb 12.3 oz)    Intake/Output:    Intake/Output Summary (Last 24 hours) at 03/03/16 1109 Last data filed at 03/03/16 1022  Gross per 24 hour  Intake 5184.38 ml  Output    375 ml  Net 4809.38 ml     Physical Exam: General: Critically ill appearing, lethargic  HEENT ETT  Neck supple  Pulm/lungs Vent assisted bilateral rhonchi  CVS/Heart Regular, soft systolic murmur  Abdomen:  Soft, non distended, BS present  Extremities: No peripheral edema, rt knee drain    Neurologic: Sedated,  Not following commands  Skin: Heel skin breakdown  GU: Foley       Basic Metabolic Panel:   Recent Labs Lab 02/27/16 0331  02/29/16 1137 03/01/16 0744 03/02/16 0740 03/02/16 1542 03/03/16 0506  NA 134*  < > 132* 127* 120* 124* 124*  K 3.9  < > 3.5 3.7 4.1 3.5 3.5  CL 100*  < > 105 100* 94* 96* 92*  CO2 22  < > 20* 19* 15* 17* 20*  GLUCOSE 351*  < > 173* 209* 444* 209* 157*  BUN 78*  < > 65* 63* 78* 81* 89*  CREATININE 2.59*  < > 1.91* 2.07* 2.09* 2.14* 2.10*  CALCIUM 7.7*  < > 6.2* 5.9* 5.7* 5.7* 5.7*  MG 1.8  --   --  1.7 2.2  --  2.2  PHOS  --   --   --  3.1 5.3*  --  4.5  < > = values in this interval not displayed.   CBC:  Recent  Labs Lab 02/28/16 0431 02/29/16 0513 03/01/16 0744 03/02/16 0740 03/03/16 0506  WBC 15.2* 18.3* 15.7* 12.8* 11.8*  HGB 9.7* 9.2* 9.1* 8.2* 7.4*  HCT 29.1* 27.4* 27.3* 24.6* 21.8*  MCV 93.4 90.3 91.5 90.9 87.9  PLT 184 177 178 148* 139*      Microbiology:  Recent Results (from the past 720 hour(s))  Blood culture (routine x 2)     Status: Abnormal   Collection Time: 03/24/2016 12:59 PM  Result Value Ref Range Status   Specimen Description BLOOD LEFT FOREARM  Final   Special Requests BOTTLES DRAWN AEROBIC AND ANAEROBIC  1CC  Final   Culture  Setup Time   Final    GRAM POSITIVE COCCI AEROBIC BOTTLE ONLY CRITICAL VALUE NOTED.  VALUE IS CONSISTENT WITH PREVIOUSLY REPORTED AND CALLED VALUE.    Culture STREPTOCOCCUS PYOGENES AEROBIC BOTTLE ONLY  (A)  Final   Report Status 03/03/2016 FINAL  Final   Organism ID, Bacteria STREPTOCOCCUS PYOGENES  Final      Susceptibility   Streptococcus pyogenes -  MIC*    CLINDAMYCIN Value in next row Sensitive      SENSITIVE0.25    AMPICILLIN Value in next row Sensitive      SENSITIVE0.25    ERYTHROMYCIN Value in next row Sensitive      SENSITIVE0.12    VANCOMYCIN Value in next row Sensitive      SENSITIVE0.5    CEFTRIAXONE Value in next row Sensitive      SENSITIVE0.12    LEVOFLOXACIN Value in next row Sensitive      SENSITIVE0.5    * STREPTOCOCCUS PYOGENES  Rapid Influenza A&B Antigens ([facility] only)     Status: None   Collection Time: 03/13/2016  1:00 PM  Result Value Ref Range Status   Influenza A ([facility]) NEGATIVE NEGATIVE Final   Influenza B ([facility]) NEGATIVE NEGATIVE Final  Blood culture (routine x 2)     Status: None   Collection Time: 03/10/2016  1:40 PM  Result Value Ref Range Status   Specimen Description BLOOD LEFT FOREARM  Final   Special Requests BOTTLES DRAWN AEROBIC AND ANAEROBIC  4CC  Final   Culture  Setup Time   Final    GRAM POSITIVE COCCI IN BOTH AEROBIC AND ANAEROBIC BOTTLES CRITICAL RESULT CALLED TO, READ BACK BY AND  VERIFIED WITH: NATE COOKSON AT 9470 02/27/16 CAF    Culture   Final    STREPTOCOCCUS PYOGENES IN BOTH AEROBIC AND ANAEROBIC BOTTLES    Report Status 02/29/2016 FINAL  Final   Organism ID, Bacteria STREPTOCOCCUS PYOGENES  Final      Susceptibility   Streptococcus pyogenes - MIC*    CLINDAMYCIN Value in next row Sensitive      SENSITIVE<=0.25    AMPICILLIN Value in next row Sensitive      SENSITIVE<=0.25    ERYTHROMYCIN Value in next row Sensitive      SENSITIVE0.12    VANCOMYCIN Value in next row Sensitive      SENSITIVE0.5    CEFTRIAXONE Value in next row Sensitive      SENSITIVE0.12    LEVOFLOXACIN Value in next row Sensitive      SENSITIVE0.5    * STREPTOCOCCUS PYOGENES  Blood Culture ID Panel (Reflexed)     Status: Abnormal   Collection Time: 02/27/2016  1:40 PM  Result Value Ref Range Status   Enterococcus species NOT DETECTED NOT DETECTED Final   Vancomycin resistance NOT DETECTED NOT DETECTED Final   Listeria monocytogenes NOT DETECTED NOT DETECTED Final   Staphylococcus species NOT DETECTED NOT DETECTED Final   Staphylococcus aureus NOT DETECTED NOT DETECTED Final   Methicillin resistance NOT DETECTED NOT DETECTED Final   Streptococcus species NOT DETECTED NOT DETECTED Final   Streptococcus agalactiae NOT DETECTED NOT DETECTED Final   Streptococcus pneumoniae NOT DETECTED NOT DETECTED Final   Streptococcus pyogenes DETECTED (A) NOT DETECTED Final    Comment: CRITICAL RESULT CALLED TO, READ BACK BY AND VERIFIED WITH: NATE COOKSON AT 0426 02/27/16 CAF    Acinetobacter baumannii NOT DETECTED NOT DETECTED Final   Enterobacteriaceae species NOT DETECTED NOT DETECTED Final   Enterobacter cloacae complex NOT DETECTED NOT DETECTED Final   Escherichia coli NOT DETECTED NOT DETECTED Final   Klebsiella oxytoca NOT DETECTED NOT DETECTED Final   Klebsiella pneumoniae NOT DETECTED NOT DETECTED Final   Proteus species NOT DETECTED NOT DETECTED Final   Serratia marcescens NOT DETECTED  NOT DETECTED Final   Carbapenem resistance NOT DETECTED NOT DETECTED Final   Haemophilus influenzae NOT DETECTED NOT DETECTED Final  Neisseria meningitidis NOT DETECTED NOT DETECTED Final   Pseudomonas aeruginosa NOT DETECTED NOT DETECTED Final   Candida albicans NOT DETECTED NOT DETECTED Final   Candida glabrata NOT DETECTED NOT DETECTED Final   Candida krusei NOT DETECTED NOT DETECTED Final   Candida parapsilosis NOT DETECTED NOT DETECTED Final   Candida tropicalis NOT DETECTED NOT DETECTED Final  Body fluid culture     Status: None   Collection Time: 03/23/2016  3:05 PM  Result Value Ref Range Status   Specimen Description R Knee  Final   Special Requests NONE  Final   Gram Stain   Final    MODERATE WBC SEEN MODERATE GRAM POSITIVE COCCI IN PAIRS AND CHAINS    Culture   Final    HEAVY GROWTH GROUP A STREP (S.PYOGENES) ISOLATED There is no known Penicillin Resistant Beta Streptococcus in the [place] For patients that are Penicillin-allergic, Erythromycin is 85-94% susceptible, and Clindamycin is 80% susceptible.  Contact Microbiology within 7 days if sensitivity testing is  required.   NO ANAEROBES ISOLATED    Report Status 02/29/2016 FINAL  Final  Body fluid culture     Status: None   Collection Time: 03/17/2016  4:10 PM  Result Value Ref Range Status   Specimen Description R Knee  Final   Special Requests NONE  Final   Gram Stain   Final    MANY WBC SEEN MANY GRAM POSITIVE COCCI IN PAIRS AND CHAINS    Culture   Final    HEAVY GROWTH GROUP A STREP (S.PYOGENES) ISOLATED There is no known Penicillin Resistant Beta Streptococcus in the [place] For patients that are Penicillin-allergic, Erythromycin is 85-94% susceptible, and Clindamycin is 80% susceptible.  Contact Microbiology within 7 days if sensitivity testing is  required.   NO ANAEROBES ISOLATED    Report Status 02/29/2016 FINAL  Final  MRSA PCR Screening     Status: None   Collection Time: 02/27/16  3:35 AM  Result Value Ref  Range Status   MRSA by PCR NEGATIVE NEGATIVE Final    Comment:        The GeneXpert MRSA Assay (FDA approved for NASAL specimens only), is one component of a comprehensive MRSA colonization surveillance program. It is not intended to diagnose MRSA infection nor to guide or monitor treatment for MRSA infections.   Urine culture     Status: None   Collection Time: 02/27/16  8:30 AM  Result Value Ref Range Status   Specimen Description URINE, RANDOM  Final   Special Requests NONE  Final   Culture NO GROWTH 1 DAY  Final   Report Status 02/28/2016 FINAL  Final  Culture, blood (single) w Reflex to PCR ID Panel     Status: None (Preliminary result)   Collection Time: 02/29/16  2:36 PM  Result Value Ref Range Status   Specimen Description BLOOD LEFT HAND  Final   Special Requests BOTTLES DRAWN AEROBIC AND ANAEROBIC [place]  Final   Culture NO GROWTH 2 DAYS  Final   Report Status PENDING  Incomplete    Coagulation Studies: No results for input(s): LABPROT, INR in the last 72 hours.  Urinalysis: No results for input(s): COLORURINE, LABSPEC, PHURINE, GLUCOSEU, HGBUR, BILIRUBINUR, KETONESUR, PROTEINUR, UROBILINOGEN, NITRITE, LEUKOCYTESUR in the last 72 hours.  Invalid input(s): APPERANCEUR    Imaging: Dg Chest Port 1 View  03/03/2016  CLINICAL DATA:  Ventilator dependent respiratory failure. Followup basilar atelectasis. EXAM: PORTABLE CHEST 1 VIEW COMPARISON:  02/27/2016 and earlier. FINDINGS: Endotracheal tube  tip in satisfactory position projecting approximately 4 cm above the carina. Right jugular central venous catheter tip projects over the mid SVC, unchanged. Nasogastric tube looped in the stomach with its tip in the fundus. Markedly suboptimal inspiration with atelectasis in the lung bases, left greater than right, unchanged since the examination 5 days ago. Prior sternotomy CABG. Cardiac silhouette moderately enlarged, unchanged. Pulmonary venous hypertension, increased  since the prior examination, without overt edema currently. No new pulmonary parenchymal abnormalities. IMPRESSION: 1. Support apparatus satisfactory. 2. Markedly suboptimal inspiration with bibasilar atelectasis, left greater than right, stable since the examination 5 days ago. 3. Stable cardiomegaly. Pulmonary venous hypertension, increased since the examination 5 days ago, without overt edema currently. Electronically Signed   By: Evangeline Dakin M.D.   On: 03/03/2016 09:35     Medications:   . feeding supplement (VITAL AF 1.2 CAL) 1,000 mL (03/02/16 1600)  . fentaNYL infusion INTRAVENOUS 20 mcg/hr (03/03/16 0832)  . insulin (NOVOLIN-R) infusion 7.8 Units/hr (03/03/16 1022)  . norepinephrine (LEVOPHED) Adult infusion 1 mcg/min (03/03/16 0916)  . propofol (DIPRIVAN) infusion 20 mcg/kg/min (03/03/16 0718)  .  sodium bicarbonate 150 mEq in sterile water 1000 mL infusion 100 mL/hr at 03/03/16 0802  . vasopressin (PITRESSIN) infusion - *FOR SHOCK* 0.03 Units/min (03/03/16 0933)   . antiseptic oral rinse  7 mL Mouth Rinse QID  . aspirin  81 mg Per Tube Daily  . budesonide (PULMICORT) nebulizer solution  0.5 mg Nebulization BID  . chlorhexidine gluconate (SAGE KIT)  15 mL Mouth Rinse BID  . famotidine (PEPCID) IV  20 mg Intravenous Q12H  . feeding supplement (PRO-STAT SUGAR FREE 64)  30 mL Per Tube BID  . gabapentin  300 mg Per Tube QHS  . heparin subcutaneous  5,000 Units Subcutaneous 3 times per day  . hydrocortisone sod succinate (SOLU-CORTEF) inj  50 mg Intravenous Q6H  . ipratropium-albuterol  3 mL Nebulization QID  . pencillin G potassium IV  4 Million Units Intravenous 4 times per day  . potassium chloride  40 mEq Per Tube Once  . pravastatin  40 mg Oral q1800  . sodium chloride flush  3 mL Intravenous Q12H   acetaminophen **OR** acetaminophen, haloperidol lactate, morphine injection, ondansetron **OR** ondansetron (ZOFRAN) IV, sodium chloride flush  Assessment/ Plan:  75 y.o.  Caucasian male  with significant medical history of coronary disease with four-vessel CABG in 1998, gallbladder rupture, extensive surgery, Diabetes with complications of retinopathy, peripheral neuropathy, peripheral vascular disease, Aorto iliac bypass, angioplasty and stent in his left leg, kyphoplasty for chronic back pain, CKD st 3 with Baseline Cr 1.5/ GFR 46  1. 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 Rt Knee septic arthritis 3. NSTEMI 4. DM-2 with CKD 5. Hypotension 6. Acute resp faliure. Intubated 4/3 7. Hypokalemia 8. Hyponatremia 9. Septic shock. 10. Metabolic Acidosis.  Plan: Patient remains critically ill at this point in time. Urine output is in the oliguric range. Discussed case with the patient's wife. We advised that we may need to proceed with renal placement therapy within the next 24-48 hours. She would like to consider this a bit further and talk to her son. For now continue supportive care with IV fluids, pressor therapy, anabolic therapy, and ventilator support. We'll reassess for the need of renal replacement therapy tomorrow. This was also discussed with pulmonary critical care. Overall patient has very guarded prognosis.   LOS: 6 Tekoa Hamor 4/8/201711:09 AM

## 2016-03-03 NOTE — Progress Notes (Signed)
Orthopaedics Patient still intubated. Family at bedside. PT evaluated patient yesterday and performed ROM/stretching to the extremities.  AF Lower extremities - Heels benign without evidence of skin compromise. Mild edema. Good capillary refill. Right knee without erythema. No significant effusion or increased warmth.  Notes from Nephrology, Critical Care, and physical therapy reviewed. Still on Penicillin for treatment of Strep pyogenes. Will continue to follow.  Skylur Fuston P. Holley Bouche M.D.

## 2016-03-03 NOTE — Progress Notes (Signed)
Pt has had multiple watery bowel movements, large in size.  Per Magdelene, NP, insert rectal tube.  Also, CDiff sample not recommended due to pt being on laxatives.

## 2016-03-03 NOTE — Progress Notes (Signed)
Vent set rate decreased to 18 per verbal order from Dr. Stevenson Clinch due to earlier ABG results.

## 2016-03-03 NOTE — Progress Notes (Signed)
LCSW met with patient and his wife. Patient was on a ventilator and sedated so LCSW and family members met to review patient options. Wilkins Elpers [phone] is his HCPOA and accepted the list of SNF for future SNF rehab options for her husband. Verbal consent was given to send information out via the hub to see if bed offers are made for her husband in the near future. LCSW supported wife and collected information to complete assessment and Fl2. LCSW sent out CSW initial referral and Fl2 and will await responses for bed offers.  Shelly Shoultz LCSW

## 2016-03-04 ENCOUNTER — Inpatient Hospital Stay: Payer: Commercial Managed Care - HMO

## 2016-03-04 LAB — BASIC METABOLIC PANEL
ANION GAP: 12 (ref 5–15)
BUN: 92 mg/dL — ABNORMAL HIGH (ref 6–20)
CALCIUM: 5.5 mg/dL — AB (ref 8.9–10.3)
CHLORIDE: 89 mmol/L — AB (ref 101–111)
CO2: 24 mmol/L (ref 22–32)
Creatinine, Ser: 2.11 mg/dL — ABNORMAL HIGH (ref 0.61–1.24)
GFR calc non Af Amer: 29 mL/min — ABNORMAL LOW (ref >60)
GFR, EST AFRICAN AMERICAN: 34 mL/min — AB (ref >60)
GLUCOSE: 152 mg/dL — AB (ref 65–99)
Potassium: 3.3 mmol/L — ABNORMAL LOW (ref 3.5–5.1)
Sodium: 125 mmol/L — ABNORMAL LOW (ref 135–145)

## 2016-03-04 LAB — RENAL FUNCTION PANEL
ALBUMIN: 1.3 g/dL — AB (ref 3.5–5.0)
ANION GAP: 13 (ref 5–15)
ANION GAP: 11 (ref 5–15)
ANION GAP: 7 (ref 5–15)
Albumin: 1.3 g/dL — ABNORMAL LOW (ref 3.5–5.0)
Albumin: 1.3 g/dL — ABNORMAL LOW (ref 3.5–5.0)
Albumin: 1.3 g/dL — ABNORMAL LOW (ref 3.5–5.0)
Anion gap: 8 (ref 5–15)
BUN: 94 mg/dL — ABNORMAL HIGH (ref 6–20)
BUN: 79 mg/dL — AB (ref 6–20)
BUN: 70 mg/dL — ABNORMAL HIGH (ref 6–20)
BUN: 65 mg/dL — ABNORMAL HIGH (ref 6–20)
CALCIUM: 5.4 mg/dL — AB (ref 8.9–10.3)
CALCIUM: 6.4 mg/dL — AB (ref 8.9–10.3)
CHLORIDE: 95 mmol/L — AB (ref 101–111)
CO2: 23 mmol/L (ref 22–32)
CO2: 23 mmol/L (ref 22–32)
CO2: 24 mmol/L (ref 22–32)
CO2: 24 mmol/L (ref 22–32)
Calcium: 6.1 mg/dL — CL (ref 8.9–10.3)
Calcium: 6.5 mg/dL — ABNORMAL LOW (ref 8.9–10.3)
Chloride: 89 mmol/L — ABNORMAL LOW (ref 101–111)
Chloride: 98 mmol/L — ABNORMAL LOW (ref 101–111)
Chloride: 99 mmol/L — ABNORMAL LOW (ref 101–111)
Creatinine, Ser: 2.15 mg/dL — ABNORMAL HIGH (ref 0.61–1.24)
Creatinine, Ser: 1.74 mg/dL — ABNORMAL HIGH (ref 0.61–1.24)
Creatinine, Ser: 1.48 mg/dL — ABNORMAL HIGH (ref 0.61–1.24)
Creatinine, Ser: 1.35 mg/dL — ABNORMAL HIGH (ref 0.61–1.24)
GFR calc Af Amer: 33 mL/min — ABNORMAL LOW (ref >60)
GFR calc Af Amer: 59 mL/min — ABNORMAL LOW (ref >60)
GFR calc non Af Amer: 29 mL/min — ABNORMAL LOW (ref >60)
GFR, EST AFRICAN AMERICAN: 43 mL/min — AB (ref >60)
GFR, EST AFRICAN AMERICAN: 52 mL/min — AB (ref >60)
GFR, EST NON AFRICAN AMERICAN: 37 mL/min — AB (ref >60)
GFR, EST NON AFRICAN AMERICAN: 45 mL/min — AB (ref >60)
GFR, EST NON AFRICAN AMERICAN: 50 mL/min — AB (ref >60)
GLUCOSE: 158 mg/dL — AB (ref 65–99)
GLUCOSE: 185 mg/dL — AB (ref 65–99)
Glucose, Bld: 153 mg/dL — ABNORMAL HIGH (ref 65–99)
Glucose, Bld: 177 mg/dL — ABNORMAL HIGH (ref 65–99)
PHOSPHORUS: 3.4 mg/dL (ref 2.5–4.6)
PHOSPHORUS: 3.1 mg/dL (ref 2.5–4.6)
POTASSIUM: 3.6 mmol/L (ref 3.5–5.1)
POTASSIUM: 3.7 mmol/L (ref 3.5–5.1)
Phosphorus: 4.1 mg/dL (ref 2.5–4.6)
Phosphorus: 3.6 mg/dL (ref 2.5–4.6)
Potassium: 3.6 mmol/L (ref 3.5–5.1)
Potassium: 3.8 mmol/L (ref 3.5–5.1)
SODIUM: 125 mmol/L — AB (ref 135–145)
SODIUM: 130 mmol/L — AB (ref 135–145)
SODIUM: 130 mmol/L — AB (ref 135–145)
Sodium: 129 mmol/L — ABNORMAL LOW (ref 135–145)

## 2016-03-04 LAB — PHOSPHORUS: PHOSPHORUS: 4.1 mg/dL (ref 2.5–4.6)

## 2016-03-04 LAB — ALBUMIN: ALBUMIN: 1.3 g/dL — AB (ref 3.5–5.0)

## 2016-03-04 LAB — MAGNESIUM
MAGNESIUM: 2 mg/dL (ref 1.7–2.4)
MAGNESIUM: 2 mg/dL (ref 1.7–2.4)
Magnesium: 2.2 mg/dL (ref 1.7–2.4)
Magnesium: 2.1 mg/dL (ref 1.7–2.4)
Magnesium: 2 mg/dL (ref 1.7–2.4)

## 2016-03-04 LAB — GLUCOSE, CAPILLARY
GLUCOSE-CAPILLARY: 143 mg/dL — AB (ref 65–99)
GLUCOSE-CAPILLARY: 158 mg/dL — AB (ref 65–99)
GLUCOSE-CAPILLARY: 162 mg/dL — AB (ref 65–99)
GLUCOSE-CAPILLARY: 167 mg/dL — AB (ref 65–99)
GLUCOSE-CAPILLARY: 168 mg/dL — AB (ref 65–99)
GLUCOSE-CAPILLARY: 170 mg/dL — AB (ref 65–99)
GLUCOSE-CAPILLARY: 174 mg/dL — AB (ref 65–99)
GLUCOSE-CAPILLARY: 176 mg/dL — AB (ref 65–99)
GLUCOSE-CAPILLARY: 183 mg/dL — AB (ref 65–99)
GLUCOSE-CAPILLARY: 184 mg/dL — AB (ref 65–99)
Glucose-Capillary: 155 mg/dL — ABNORMAL HIGH (ref 65–99)
Glucose-Capillary: 161 mg/dL — ABNORMAL HIGH (ref 65–99)
Glucose-Capillary: 163 mg/dL — ABNORMAL HIGH (ref 65–99)
Glucose-Capillary: 157 mg/dL — ABNORMAL HIGH (ref 65–99)
Glucose-Capillary: 175 mg/dL — ABNORMAL HIGH (ref 65–99)
Glucose-Capillary: 164 mg/dL — ABNORMAL HIGH (ref 65–99)
Glucose-Capillary: 162 mg/dL — ABNORMAL HIGH (ref 65–99)
Glucose-Capillary: 156 mg/dL — ABNORMAL HIGH (ref 65–99)
Glucose-Capillary: 136 mg/dL — ABNORMAL HIGH (ref 65–99)
Glucose-Capillary: 153 mg/dL — ABNORMAL HIGH (ref 65–99)
Glucose-Capillary: 178 mg/dL — ABNORMAL HIGH (ref 65–99)

## 2016-03-04 LAB — CBC
HCT: 21.3 % — ABNORMAL LOW (ref 40.0–52.0)
Hemoglobin: 7.4 g/dL — ABNORMAL LOW (ref 13.0–18.0)
MCH: 30.5 pg (ref 26.0–34.0)
MCHC: 34.9 g/dL (ref 32.0–36.0)
MCV: 87.4 fL (ref 80.0–100.0)
PLATELETS: 130 10*3/uL — AB (ref 150–440)
RBC: 2.44 MIL/uL — AB (ref 4.40–5.90)
RDW: 13.6 % (ref 11.5–14.5)
WBC: 11.3 10*3/uL — ABNORMAL HIGH (ref 3.8–10.6)

## 2016-03-04 LAB — ABO/RH: ABO/RH(D): A NEG

## 2016-03-04 LAB — PREPARE RBC (CROSSMATCH)

## 2016-03-04 MED ORDER — PUREFLOW DIALYSIS SOLUTION
INTRAVENOUS | Status: DC
  Administered 2016-03-04 (×2): 3 via INTRAVENOUS_CENTRAL
  Administered 2016-03-05 – 2016-03-08 (×7): via INTRAVENOUS_CENTRAL

## 2016-03-04 MED ORDER — POTASSIUM CHLORIDE 10 MEQ/50ML IV SOLN
10.0000 meq | INTRAVENOUS | Status: AC
  Administered 2016-03-04 (×4): 10 meq via INTRAVENOUS
  Filled 2016-03-04 (×4): qty 50

## 2016-03-04 MED ORDER — SODIUM CHLORIDE 0.9 % IV SOLN
1.0000 g | Freq: Once | INTRAVENOUS | Status: AC
  Administered 2016-03-04: 1 g via INTRAVENOUS
  Filled 2016-03-04: qty 10

## 2016-03-04 MED ORDER — SODIUM CHLORIDE 0.9 % IV SOLN
Freq: Once | INTRAVENOUS | Status: AC
  Administered 2016-03-04: 11:00:00 via INTRAVENOUS

## 2016-03-04 MED ORDER — SODIUM CHLORIDE 0.9 % IV SOLN
Freq: Four times a day (QID) | INTRAVENOUS | Status: DC
  Administered 2016-03-04 – 2016-03-07 (×12): via INTRAVENOUS
  Filled 2016-03-04 (×15): qty 250

## 2016-03-04 MED ORDER — POTASSIUM CHLORIDE CRYS ER 20 MEQ PO TBCR
40.0000 meq | EXTENDED_RELEASE_TABLET | Freq: Once | ORAL | Status: AC
  Administered 2016-03-04: 40 meq via ORAL
  Filled 2016-03-04: qty 2

## 2016-03-04 MED ORDER — FAMOTIDINE IN NACL 20-0.9 MG/50ML-% IV SOLN
20.0000 mg | INTRAVENOUS | Status: DC
  Filled 2016-03-04: qty 50

## 2016-03-04 MED ORDER — HEPARIN SODIUM (PORCINE) 1000 UNIT/ML DIALYSIS
1000.0000 [IU] | INTRAMUSCULAR | Status: DC | PRN
  Filled 2016-03-04: qty 6

## 2016-03-04 NOTE — Plan of Care (Signed)
Problem: Safety: Goal: Ability to remain free from injury will improve Outcome: Progressing No injury  Problem: Pain Managment: Goal: General experience of comfort will improve Outcome: Progressing CPOT zero with fentanyl gtt 50 mcg  Problem: Tissue Perfusion: Goal: Risk factors for ineffective tissue perfusion will decrease Outcome: Progressing On heparin SQ.  CRRT started . No problems with clotting of filter  Problem: Fluid Volume: Goal: Ability to maintain a balanced intake and output will improve Outcome: Progressing Poor uop. CRRT initiated ar noon. BUN 79. Creat 1.74  Problem: Fluid Volume: Goal: Hemodynamic stability will improve Outcome: Progressing Able to wean off levophed. Remains on vasopressin 0.03u/min. Transfusing 1 unit PRBC's. Hgb 7.4 this am  Problem: Physical Regulation: Goal: Signs and symptoms of infection will decrease Outcome: Progressing WBC's11.3. Afebrile.  Penicillin G antibiotic ordered. Right knee appears stable. No redness  Problem: Respiratory: Goal: Ability to maintain adequate ventilation will improve Outcome: Not Progressing Failed SBT due to tachypnea.  Elevated HR. Increased ectopy. Increased work of breathing with accessory muscle use.

## 2016-03-04 NOTE — Progress Notes (Signed)
PULMONARY / CRITICAL CARE MEDICINE   Name: SHELIA OHLER MRN: [id number] DOB: 1941/07/04    ADMISSION DATE:  03/08/2016  BRIEF HISTORY: 75 year old male past medical history of hypertension, chronic diastolic heart failure, CK, diabetes, presented on April 2 with four-day history of progressive right knee pain fever, shortness of breath, cough. Within 24 hours decompensated respiratory-wise requiring intubation on 04/3.  Seen by orthopedics, had drain placed in right knee with purulent material. Now with mechanical ventilation and slowly been weaned off. Remains on insulin gtte 2with blood sugars ranging between 155-172  SUBJECTIVE:  No acute issues overnight. Remains on full vent support. Failed SBT 04/08 due to respiratory muscle fatique ; SBT/PSV trial to be resumed this am. Positive Hemoccult 04/08; repeat hemoglobin pending. No bleeding noted.   STUDIES:  4/4 Echo> EF 60-65%, nl sys fcn  SIGNIFICANT EVENTS: 4/3-4 day history of right knee pain no trauma prior 4 4/3-aspiration of R Knee, with drainage placement in the ER by ortho 4/4-altered mental status, worsening respiratory status, septic, intubated 4/4- R knee hemovac removed by ortho  VITAL SIGNS: Temp:  [97.8 F (36.6 C)-98.8 F (37.1 C)] 98.7 F (37.1 C) (04/09 0400) Pulse Rate:  [67-100] 88 (04/09 0400) Resp:  [19-22] 21 (04/09 0400) BP: (93-128)/(45-58) 108/50 mmHg (04/09 0400) SpO2:  [96 %-99 %] 97 % (04/09 0400) FiO2 (%):  [30 %] 30 % (04/09 0026) Weight:  [223 lb 12.3 oz (101.5 kg)] 223 lb 12.3 oz (101.5 kg) (04/08 0500) HEMODYNAMICS: CVP:  [9 mmHg-19 mmHg] 13 mmHg VENTILATOR SETTINGS: Vent Mode:  [-] PRVC FiO2 (%):  [30 %] 30 % Set Rate:  [18 bmp-22 bmp] 18 bmp Vt Set:  [620 mL] 620 mL PEEP:  [5 cmH20] 5 cmH20 Plateau Pressure:  [18 cmH20-24 cmH20] 24 cmH20 INTAKE / OUTPUT:  Intake/Output Summary (Last 24 hours) at 03/04/16 0457 Last data filed at 03/04/16 0000  Gross per 24 hour  Intake 3298.84 ml   Output   1050 ml  Net 2248.84 ml    Review of Systems  Unable to perform ROS: intubated    Physical Exam  Constitutional: He is well-developed, well-nourished, and in no distress.  HENT:  Head: Normocephalic and atraumatic.  Right Ear: External ear normal.  Left Ear: External ear normal.  Eyes: Pupils are equal, round, and reactive to light.  Neck: Normal range of motion. Neck supple. No tracheal deviation present.  Cardiovascular: Normal rate, regular rhythm and normal heart sounds.   No murmur heard. Pulmonary/Chest:  Intubated, agitated on vent  Abdominal: Soft. Bowel sounds are normal. He exhibits no distension.  Musculoskeletal:  Rknee with mild erythema - no drain present  Neurological:  Opens eyes to voice commands; moves all extremities  Skin: Skin is warm and dry.  Nursing note and vitals reviewed.    LABS:  CBC  Recent Labs Lab 03/01/16 0744 03/02/16 0740 03/03/16 0506 03/03/16 1049  WBC 15.7* 12.8* 11.8*  --   HGB 9.1* 8.2* 7.4* 7.4*  HCT 27.3* 24.6* 21.8* 21.2*  PLT 178 148* 139*  --    Coag's  Recent Labs Lab 02/27/16 0339  APTT 34  INR 1.40   BMET  Recent Labs Lab 03/02/16 1542 03/03/16 0506 03/03/16 1049  NA 124* 124* 124*  K 3.5 3.5 3.2*  CL 96* 92* 91*  CO2 17* 20* 20*  BUN 81* 89* 87*  CREATININE 2.14* 2.10* 2.03*  GLUCOSE 209* 157* 177*   Electrolytes  Recent Labs Lab 03/01/16 0744 03/02/16 0740  03/02/16 1542 03/03/16 0506 03/03/16 1049  CALCIUM 5.9* 5.7* 5.7* 5.7* 5.4*  MG 1.7 2.2  --  2.2  --   PHOS 3.1 5.3*  --  4.5  --    Sepsis Markers  Recent Labs Lab 02/27/16 0339 02/27/16 1200 02/29/16 0513 03/02/16 0740 03/02/16 0812  LATICACIDVEN 2.2* 1.3  --   --  1.3  PROCALCITON 22.50  --  16.65 7.12  --    ABG  Recent Labs Lab 02/27/16 1646 03/02/16 0921 03/03/16 1053  PHART 7.34* 7.26* 7.47*  PCO2ART 35 34 31*  PO2ART 92 90 80*   Liver Enzymes  Recent Labs Lab 02/27/16 0055 02/29/16 0513   AST 51*  --   ALT 21  --   ALKPHOS 58  --   BILITOT 1.4*  --   ALBUMIN 2.4* 1.5*   Cardiac Enzymes  Recent Labs Lab 03/07/2016 1914 02/27/16 0055 02/27/16 0832  TROPONINI 2.90* 7.28* 8.10*   Glucose  Recent Labs Lab 03/03/16 2355 03/04/16 0003 03/04/16 0103 03/04/16 0209 03/04/16 0255 03/04/16 0358  GLUCAP 151* 168* 155* 174* 167* 161*    Imaging Dg Abd 1 View  03/03/2016  CLINICAL DATA:  Diarrhea EXAM: ABDOMEN - 1 VIEW COMPARISON:  February 27, 2016 FINDINGS: An NG tube terminates in left upper quadrant. No free air, portal venous gas, or pneumatosis. No acute abnormalities. IMPRESSION: No acute abnormalities. Electronically Signed   By: Dorise Bullion III M.D   On: 03/03/2016 11:33   Dg Chest Port 1 View  03/03/2016  CLINICAL DATA:  Ventilator dependent respiratory failure. Followup basilar atelectasis. EXAM: PORTABLE CHEST 1 VIEW COMPARISON:  02/27/2016 and earlier. FINDINGS: Endotracheal tube tip in satisfactory position projecting approximately 4 cm above the carina. Right jugular central venous catheter tip projects over the mid SVC, unchanged. Nasogastric tube looped in the stomach with its tip in the fundus. Markedly suboptimal inspiration with atelectasis in the lung bases, left greater than right, unchanged since the examination 5 days ago. Prior sternotomy CABG. Cardiac silhouette moderately enlarged, unchanged. Pulmonary venous hypertension, increased since the prior examination, without overt edema currently. No new pulmonary parenchymal abnormalities. IMPRESSION: 1. Support apparatus satisfactory. 2. Markedly suboptimal inspiration with bibasilar atelectasis, left greater than right, stable since the examination 5 days ago. 3. Stable cardiomegaly. Pulmonary venous hypertension, increased since the examination 5 days ago, without overt edema currently. Electronically Signed   By: Evangeline Dakin M.D.   On: 03/03/2016 09:35    INDWELLING DEVICES:: RT IJ CVL 4/3>>> ETT  8.0 4/3>>>  MICRO DATA: MRSA PCR negative Urine  Blood 4/2>> Strep pyogenes Blood 4/5>> No growth to date Resp  R knee WOUND [id number] A strep ANTIMICROBIALS:  Zosyn and Vancomycin>>4/3 Clindamycin 4/3>4/7 PCN G 4/3>>>>  ASSESSMENT / PLAN:  75 yo white male admitted to ICU for acute septic shock with resp distress from severe acidosis and septic arthritis-GROUP A STREP Bacteremia  Patient at high risk for cardiac arrest and death, intubated for acute resp failure complicated by NSTEMI and ARF  PULMONARY Respiratory Failure-multiple failed SAT/SBT's. Tried SAT/SBT 04/08 noted to have tachypnea, and widening QRS on monitor, placed back on PRVC.  Acidosis-metabolic and respiratory P: -Resume SBT trials this am as tolerated -Continue Full MV support if unable to tolerate PSV -Cntinue Bronchodilator Therapy -Wean FiO2 and PEEP as tolerated - ABG after SBT/PSV trial -Continue Bicarb gtte  CARDIOVASCULAR-NSTEMI Septic shock P:  -Levophed gtte and titrate to keep MAP>65 -follow ABG and LA as needed -follow ID recs -  Continue stress dose steroids -follow up cardiology recs  RENAL ARF from ATN-Urine output improved Hypocalcemia-corrected calcium 7.7 Hypokalemia Hyponatremia P: -Cont IVFs - Monitor  UO - Nephro following for ARF - May conisder HD in the next 1-2 days if urine output is consistently low, no acute indication for emergent HD  - Monitor and replace electrolytes  GASTROINTESTINAL Diarrhea  Hemoccult positive P: -Continue to hold laxatives -Cont with TF's -Monitor for active bleeding and trend H/H -Will consider GI input if H/H continues to drop   HEMATOLOGIC Hemoccult positive Anemia-chronic disease versus blood loss P: -Trend CBC -Transfuse for Hb <7  INFECTIOUS Septic arthritis of the RT knee s/p drainage P: -abx as prescribed -follow ortho recs -follow ID recs  ENDOCRINE Type 2 DM- Hyperglycemia improved; no anion gap P: -Will d/c  insulin gtte if blood glucose level <200 mg/dl consistently and start long acting basal insulin coverage with aggressive sliding scale coverage   NEUROLOGIC Acute encphalopathy from acidosis P: -Sedation-RASS goal -1 -Wean sedation and assess neuro status QAM -Fentanyl and propofol for sedation and titrate to desired RASS goal   Disposition and family update: No family at bedside this am  Best Practice: Code Status:  Full. Diet:NPO GI prophylaxis:  PPI. VTE prophylaxis:  SCD's /Pharmacologic VTE prophylaxis held due to +hemecult and decreasing hemoglobin  Total PCCM time is 40 minutes  Magdalene S. [facility] ANP-BC Pulmonary and Critical Care Medicine [facility] Pager [phone]

## 2016-03-04 NOTE — Consult Note (Signed)
MEDICATION RELATED CONSULT NOTE - Follow up  Pharmacy Consult for Electrolyte Management Indication: Hypokalemia  Allergies  Allergen Reactions  . Tape Rash    Patient Measurements: Height: 6' (182.9 cm) Weight: 225 lb 15.5 oz (102.5 kg) IBW/kg (Calculated) : 77.6  Vital Signs: Temp: 98.6 F (37 C) (04/09 0730) Temp Source: Oral (04/09 0730) BP: 110/53 mmHg (04/09 0900) Pulse Rate: 93 (04/09 0900) Intake/Output from previous day: 04/08 0701 - 04/09 0700 In: 742.2 [I.V.:27.2; NG/GT:715] Out: 1525 [Urine:1050; Stool:475] Intake/Output from this shift: Total I/O In: 6217.1 [I.V.:6062.1; NG/GT:55; IV Piggyback:100] Out: 125 [Urine:125]  Labs:  Recent Labs  03/02/16 0740  03/03/16 0506 03/03/16 1049 03/04/16 0434 03/04/16 0530  WBC 12.8*  --  11.8*  --   --  11.3*  HGB 8.2*  --  7.4* 7.4*  --  7.4*  HCT 24.6*  --  21.8* 21.2*  --  21.3*  PLT 148*  --  139*  --   --  130*  CREATININE 2.09*  < > 2.10* 2.03* 2.11*  --   MG 2.2  --  2.2  --  2.2  --   PHOS 5.3*  --  4.5  --  4.1  --   ALBUMIN  --   --   --   --  1.3*  --   < > = values in this interval not displayed. Lab Results  Component Value Date   K 3.3* 03/04/2016   Lab Results  Component Value Date   CALCIUM 5.5* 03/04/2016   PHOS 4.1 03/04/2016   Estimated Creatinine Clearance: 38.6 mL/min (by C-G formula based on Cr of 2.11).  Assessment: RM is a 75 yo male admitted for NSTEMI. Pharmacy consulted to manage electrolytes in this patient.  Plan:  K= 3.3, mag= 2.2. Phos=4.1.  Ca= 5.5  Albumin (03/04/16)= 1.3  Corrected Ca= 7.66.  MD replaced K+ with order for KCL 11meq IV x 1 and KCL PO 4meq x 1dose. MD replaced calcium with 1 g Ca gluconate IV x 1. Will add another Calcium Gluconate 1 gram IV x 1 for 1400 for a total of 2 grams today 4/9.   Will f/u AM labs.   Chinita Greenland PharmD Clinical Pharmacist 03/04/2016

## 2016-03-04 NOTE — Progress Notes (Signed)
MEDICATION RELATED CONSULT NOTE - INITIAL   Pharmacy Consult for Medication dose adjustment/monitoring Indication: CRRT  Allergies  Allergen Reactions  . Tape Rash    Patient Measurements: Height: 6' (182.9 cm) Weight: 225 lb 15.5 oz (102.5 kg) IBW/kg (Calculated) : 77.6   Labs:  Recent Labs  03/02/16 0740  03/03/16 0506 03/03/16 1049 03/04/16 0434 03/04/16 0530  WBC 12.8*  --  11.8*  --   --  11.3*  HGB 8.2*  --  7.4* 7.4*  --  7.4*  HCT 24.6*  --  21.8* 21.2*  --  21.3*  PLT 148*  --  139*  --   --  130*  CREATININE 2.09*  < > 2.10* 2.03* 2.11*  --   MG 2.2  --  2.2  --  2.2  --   PHOS 5.3*  --  4.5  --  4.1  --   ALBUMIN  --   --   --   --  1.3*  --   < > = values in this interval not displayed. Estimated Creatinine Clearance: 38.6 mL/min (by C-G formula based on Cr of 2.11).   Assessment: Pharmacy consulted to monitor and adjust medication doses per CRRT. Patient is being initiated on CVVHD today.   Current medications whose dose will need to change: Famotidine 20 mg IV q12h Penicillin G potassium 4 million units IV q6h   Plan:  Change famotidine to 20 mg IV once daily Changed penicillin G potassium to 3 million units IV q6h  Thank you for the consult.  Lenis Noon, PharmD Clinical Pharmacist 03/04/2016,11:34 AM

## 2016-03-04 NOTE — Progress Notes (Signed)
Subjective:  BUNs up to 92, creatinine up to 2.11. Serum sodium also low at 125 and serum potassium 3.3. Serum calcium also quite low at 5.5. Patient also has significant volume input of almost 7 L yesterday. Therefore we suggested a trial of continuous renal placement therapy and family is willing to proceed.    Objective:  Vital signs in last 24 hours:  Temp:  [97.8 F (36.6 C)-98.8 F (37.1 C)] 98.6 F (37 C) (04/09 0730) Pulse Rate:  [80-100] 93 (04/09 0900) Resp:  [19-23] 23 (04/09 0900) BP: (93-118)/(45-57) 110/53 mmHg (04/09 0900) SpO2:  [96 %-99 %] 97 % (04/09 0900) FiO2 (%):  [30 %] 30 % (04/09 0900) Weight:  [102.5 kg (225 lb 15.5 oz)] 102.5 kg (225 lb 15.5 oz) (04/09 0500)  Weight change: 1 kg (2 lb 3.3 oz) Filed Weights   03/01/16 0512 03/03/16 0500 03/04/16 0500  Weight: 88.8 kg (195 lb 12.3 oz) 101.5 kg (223 lb 12.3 oz) 102.5 kg (225 lb 15.5 oz)    Intake/Output:    Intake/Output Summary (Last 24 hours) at 03/04/16 1013 Last data filed at 03/04/16 0800  Gross per 24 hour  Intake 6956.26 ml  Output   1650 ml  Net 5306.26 ml     Physical Exam: General: Critically ill appearing, lethargic  HEENT ETT  Neck supple  Pulm/lungs Vent assisted bilateral rhonchi  CVS/Heart Regular, soft systolic murmur  Abdomen:  Soft, non distended, BS present  Extremities: No peripheral edema, rt knee drain    Neurologic: Sedated,  Not following commands  Skin: Heel skin breakdown  GU: Foley       Basic Metabolic Panel:   Recent Labs Lab 02/27/16 0331  03/01/16 0744 03/02/16 0740 03/02/16 1542 03/03/16 0506 03/03/16 1049 03/04/16 0434  NA 134*  < > 127* 120* 124* 124* 124* 125*  K 3.9  < > 3.7 4.1 3.5 3.5 3.2* 3.3*  CL 100*  < > 100* 94* 96* 92* 91* 89*  CO2 22  < > 19* 15* 17* 20* 20* 24  GLUCOSE 351*  < > 209* 444* 209* 157* 177* 152*  BUN 78*  < > 63* 78* 81* 89* 87* 92*  CREATININE 2.59*  < > 2.07* 2.09* 2.14* 2.10* 2.03* 2.11*  CALCIUM 7.7*  < >  5.9* 5.7* 5.7* 5.7* 5.4* 5.5*  MG 1.8  --  1.7 2.2  --  2.2  --  2.2  PHOS  --   --  3.1 5.3*  --  4.5  --  4.1  < > = values in this interval not displayed.   CBC:  Recent Labs Lab 02/29/16 0513 03/01/16 0744 03/02/16 0740 03/03/16 0506 03/03/16 1049 03/04/16 0530  WBC 18.3* 15.7* 12.8* 11.8*  --  11.3*  HGB 9.2* 9.1* 8.2* 7.4* 7.4* 7.4*  HCT 27.4* 27.3* 24.6* 21.8* 21.2* 21.3*  MCV 90.3 91.5 90.9 87.9  --  87.4  PLT 177 178 148* 139*  --  130*      Microbiology:  Recent Results (from the past 720 hour(s))  Blood culture (routine x 2)     Status: Abnormal   Collection Time: 03/11/2016 12:59 PM  Result Value Ref Range Status   Specimen Description BLOOD LEFT FOREARM  Final   Special Requests BOTTLES DRAWN AEROBIC AND ANAEROBIC  1CC  Final   Culture  Setup Time   Final    GRAM POSITIVE COCCI AEROBIC BOTTLE ONLY CRITICAL VALUE NOTED.  VALUE IS CONSISTENT WITH PREVIOUSLY REPORTED AND  CALLED VALUE.    Culture STREPTOCOCCUS PYOGENES AEROBIC BOTTLE ONLY  (A)  Final   Report Status 03/03/2016 FINAL  Final   Organism ID, Bacteria STREPTOCOCCUS PYOGENES  Final      Susceptibility   Streptococcus pyogenes - MIC*    CLINDAMYCIN Value in next row Sensitive      SENSITIVE0.25    AMPICILLIN Value in next row Sensitive      SENSITIVE0.25    ERYTHROMYCIN Value in next row Sensitive      SENSITIVE0.12    VANCOMYCIN Value in next row Sensitive      SENSITIVE0.5    CEFTRIAXONE Value in next row Sensitive      SENSITIVE0.12    LEVOFLOXACIN Value in next row Sensitive      SENSITIVE0.5    * STREPTOCOCCUS PYOGENES  Rapid Influenza A&B Antigens ([facility] only)     Status: None   Collection Time: 03/10/2016  1:00 PM  Result Value Ref Range Status   Influenza A ([facility]) NEGATIVE NEGATIVE Final   Influenza B ([facility]) NEGATIVE NEGATIVE Final  Blood culture (routine x 2)     Status: None   Collection Time: 03/17/2016  1:40 PM  Result Value Ref Range Status   Specimen Description BLOOD LEFT  FOREARM  Final   Special Requests BOTTLES DRAWN AEROBIC AND ANAEROBIC  4CC  Final   Culture  Setup Time   Final    GRAM POSITIVE COCCI IN BOTH AEROBIC AND ANAEROBIC BOTTLES CRITICAL RESULT CALLED TO, READ BACK BY AND VERIFIED WITH: NATE COOKSON AT 0426 02/27/16 CAF    Culture   Final    STREPTOCOCCUS PYOGENES IN BOTH AEROBIC AND ANAEROBIC BOTTLES    Report Status 02/29/2016 FINAL  Final   Organism ID, Bacteria STREPTOCOCCUS PYOGENES  Final      Susceptibility   Streptococcus pyogenes - MIC*    CLINDAMYCIN Value in next row Sensitive      SENSITIVE<=0.25    AMPICILLIN Value in next row Sensitive      SENSITIVE<=0.25    ERYTHROMYCIN Value in next row Sensitive      SENSITIVE0.12    VANCOMYCIN Value in next row Sensitive      SENSITIVE0.5    CEFTRIAXONE Value in next row Sensitive      SENSITIVE0.12    LEVOFLOXACIN Value in next row Sensitive      SENSITIVE0.5    * STREPTOCOCCUS PYOGENES  Blood Culture ID Panel (Reflexed)     Status: Abnormal   Collection Time: 03/01/2016  1:40 PM  Result Value Ref Range Status   Enterococcus species NOT DETECTED NOT DETECTED Final   Vancomycin resistance NOT DETECTED NOT DETECTED Final   Listeria monocytogenes NOT DETECTED NOT DETECTED Final   Staphylococcus species NOT DETECTED NOT DETECTED Final   Staphylococcus aureus NOT DETECTED NOT DETECTED Final   Methicillin resistance NOT DETECTED NOT DETECTED Final   Streptococcus species NOT DETECTED NOT DETECTED Final   Streptococcus agalactiae NOT DETECTED NOT DETECTED Final   Streptococcus pneumoniae NOT DETECTED NOT DETECTED Final   Streptococcus pyogenes DETECTED (A) NOT DETECTED Final    Comment: CRITICAL RESULT CALLED TO, READ BACK BY AND VERIFIED WITH: NATE COOKSON AT 0426 02/27/16 CAF    Acinetobacter baumannii NOT DETECTED NOT DETECTED Final   Enterobacteriaceae species NOT DETECTED NOT DETECTED Final   Enterobacter cloacae complex NOT DETECTED NOT DETECTED Final   Escherichia coli NOT  DETECTED NOT DETECTED Final   Klebsiella oxytoca NOT DETECTED NOT DETECTED Final   Klebsiella  pneumoniae NOT DETECTED NOT DETECTED Final   Proteus species NOT DETECTED NOT DETECTED Final   Serratia marcescens NOT DETECTED NOT DETECTED Final   Carbapenem resistance NOT DETECTED NOT DETECTED Final   Haemophilus influenzae NOT DETECTED NOT DETECTED Final   Neisseria meningitidis NOT DETECTED NOT DETECTED Final   Pseudomonas aeruginosa NOT DETECTED NOT DETECTED Final   Candida albicans NOT DETECTED NOT DETECTED Final   Candida glabrata NOT DETECTED NOT DETECTED Final   Candida krusei NOT DETECTED NOT DETECTED Final   Candida parapsilosis NOT DETECTED NOT DETECTED Final   Candida tropicalis NOT DETECTED NOT DETECTED Final  Body fluid culture     Status: None   Collection Time: 03/11/2016  3:05 PM  Result Value Ref Range Status   Specimen Description R Knee  Final   Special Requests NONE  Final   Gram Stain   Final    MODERATE WBC SEEN MODERATE GRAM POSITIVE COCCI IN PAIRS AND CHAINS    Culture   Final    HEAVY GROWTH GROUP A STREP (S.PYOGENES) ISOLATED There is no known Penicillin Resistant Beta Streptococcus in the [place] For patients that are Penicillin-allergic, Erythromycin is 85-94% susceptible, and Clindamycin is 80% susceptible.  Contact Microbiology within 7 days if sensitivity testing is  required.   NO ANAEROBES ISOLATED    Report Status 02/29/2016 FINAL  Final  Body fluid culture     Status: None   Collection Time: 03/07/2016  4:10 PM  Result Value Ref Range Status   Specimen Description R Knee  Final   Special Requests NONE  Final   Gram Stain   Final    MANY WBC SEEN MANY GRAM POSITIVE COCCI IN PAIRS AND CHAINS    Culture   Final    HEAVY GROWTH GROUP A STREP (S.PYOGENES) ISOLATED There is no known Penicillin Resistant Beta Streptococcus in the [place] For patients that are Penicillin-allergic, Erythromycin is 85-94% susceptible, and Clindamycin is 80% susceptible.  Contact  Microbiology within 7 days if sensitivity testing is  required.   NO ANAEROBES ISOLATED    Report Status 02/29/2016 FINAL  Final  MRSA PCR Screening     Status: None   Collection Time: 02/27/16  3:35 AM  Result Value Ref Range Status   MRSA by PCR NEGATIVE NEGATIVE Final    Comment:        The GeneXpert MRSA Assay (FDA approved for NASAL specimens only), is one component of a comprehensive MRSA colonization surveillance program. It is not intended to diagnose MRSA infection nor to guide or monitor treatment for MRSA infections.   Urine culture     Status: None   Collection Time: 02/27/16  8:30 AM  Result Value Ref Range Status   Specimen Description URINE, RANDOM  Final   Special Requests NONE  Final   Culture NO GROWTH 1 DAY  Final   Report Status 02/28/2016 FINAL  Final  Culture, blood (single) w Reflex to PCR ID Panel     Status: None (Preliminary result)   Collection Time: 02/29/16  2:36 PM  Result Value Ref Range Status   Specimen Description BLOOD LEFT HAND  Final   Special Requests BOTTLES DRAWN AEROBIC AND ANAEROBIC [place]  Final   Culture NO GROWTH 3 DAYS  Final   Report Status PENDING  Incomplete    Coagulation Studies: No results for input(s): LABPROT, INR in the last 72 hours.  Urinalysis: No results for input(s): COLORURINE, LABSPEC, [place], [place], [place], [place], [place], [place], [place], NITRITE, LEUKOCYTESUR  in the last 72 hours.  Invalid input(s): APPERANCEUR    Imaging: Dg Abd 1 View  03/03/2016  CLINICAL DATA:  Diarrhea EXAM: ABDOMEN - 1 VIEW COMPARISON:  February 27, 2016 FINDINGS: An NG tube terminates in left upper quadrant. No free air, portal venous gas, or pneumatosis. No acute abnormalities. IMPRESSION: No acute abnormalities. Electronically Signed   By: Dorise Bullion III M.D   On: 03/03/2016 11:33   Dg Chest Port 1 View  03/03/2016  CLINICAL DATA:  Ventilator dependent respiratory failure. Followup basilar atelectasis.  EXAM: PORTABLE CHEST 1 VIEW COMPARISON:  02/27/2016 and earlier. FINDINGS: Endotracheal tube tip in satisfactory position projecting approximately 4 cm above the carina. Right jugular central venous catheter tip projects over the mid SVC, unchanged. Nasogastric tube looped in the stomach with its tip in the fundus. Markedly suboptimal inspiration with atelectasis in the lung bases, left greater than right, unchanged since the examination 5 days ago. Prior sternotomy CABG. Cardiac silhouette moderately enlarged, unchanged. Pulmonary venous hypertension, increased since the prior examination, without overt edema currently. No new pulmonary parenchymal abnormalities. IMPRESSION: 1. Support apparatus satisfactory. 2. Markedly suboptimal inspiration with bibasilar atelectasis, left greater than right, stable since the examination 5 days ago. 3. Stable cardiomegaly. Pulmonary venous hypertension, increased since the examination 5 days ago, without overt edema currently. Electronically Signed   By: Evangeline Dakin M.D.   On: 03/03/2016 09:35     Medications:   . feeding supplement (VITAL AF 1.2 CAL) Stopped (03/04/16 0830)  . fentaNYL infusion INTRAVENOUS 20 mcg/hr (03/04/16 0840)  . insulin (NOVOLIN-R) infusion 7.4 Units/hr (03/04/16 0904)  . norepinephrine (LEVOPHED) Adult infusion 2 mcg/min (03/04/16 0730)  . propofol (DIPRIVAN) infusion 40 mcg/kg/min (03/04/16 0730)  .  sodium bicarbonate 150 mEq in sterile water 1000 mL infusion 100 mL/hr at 03/04/16 0359  . vasopressin (PITRESSIN) infusion - *FOR SHOCK* 0.03 Units/min (03/04/16 0903)   . sodium chloride   Intravenous Once  . antiseptic oral rinse  7 mL Mouth Rinse QID  . aspirin  81 mg Per Tube Daily  . budesonide (PULMICORT) nebulizer solution  0.5 mg Nebulization BID  . calcium gluconate  1 g Intravenous Once  . calcium gluconate  1 g Intravenous Once  . chlorhexidine gluconate (SAGE KIT)  15 mL Mouth Rinse BID  . famotidine (PEPCID) IV  20 mg  Intravenous Q12H  . feeding supplement (PRO-STAT SUGAR FREE 64)  30 mL Per Tube BID  . gabapentin  300 mg Per Tube QHS  . heparin subcutaneous  5,000 Units Subcutaneous 3 times per day  . hydrocortisone sod succinate (SOLU-CORTEF) inj  50 mg Intravenous Q6H  . ipratropium-albuterol  3 mL Nebulization QID  . potassium chloride  10 mEq Intravenous Q1 Hr x 4  . potassium chloride  40 mEq Oral Once  . pravastatin  40 mg Oral q1800  . sodium chloride 0.9 % 250 mL with penicillin G potassium 4 Million Units infusion   Intravenous 4 times per day  . sodium chloride flush  3 mL Intravenous Q12H   acetaminophen **OR** acetaminophen, haloperidol lactate, morphine injection, ondansetron **OR** ondansetron (ZOFRAN) IV, sodium chloride flush  Assessment/ Plan:  75 y.o. Caucasian male  with significant medical history of coronary disease with four-vessel CABG in 1998, gallbladder rupture, extensive surgery, Diabetes with complications of retinopathy, peripheral neuropathy, peripheral vascular disease, Aorto iliac bypass, angioplasty and stent in his left leg, kyphoplasty for chronic back pain, CKD st 3 with Baseline Cr 1.5/ GFR 46  1. 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 Rt Knee septic arthritis 3. NSTEMI 4. DM-2 with CKD 5. Hypotension 6. Acute resp faliure. Intubated 4/3 7. Hypokalemia 8. Hyponatremia 9. Septic shock. 10. Metabolic Acidosis.  Plan: Patient remains critically ill at this point in time and is requiring 2 pressors. No function appears to be worse with a BUN of 92 and creatinine of 2.11. He had significant volume input of 6 L over the preceding 24 hours. He also has multiple metabolic derangements including hyponatremia, hypokalemia, and azotemia. Therefore we will proceed with a trial of renal replacement therapy. Pulmonary critical care attempted temporal dialysis catheter placement but was unsuccessful. Therefore we will consult with vascular surgery for temporary dialysis catheter  placement. We will use a 4 potassium bath for now. We will also continue to closely monitor O lites.   LOS: 7 Trayden Brandy 4/9/201710:13 AM

## 2016-03-04 NOTE — Op Note (Signed)
  OPERATIVE NOTE   PROCEDURE: 1. Ultrasound guidance for vascular access right femoral vein 2. Placement of a 30 cm duoglide dialysis catheter right femoral vein  PRE-OPERATIVE DIAGNOSIS: 1. Acute renal failure 2. Multiple system organ failure with hypotension requiring pressors and ventilator dependence  POST-OPERATIVE DIAGNOSIS: Same  SURGEON: Leotis Pain, MD  ASSISTANT(S): None  ANESTHESIA: local  ESTIMATED BLOOD LOSS: Minimal   FINDING(S): 1. None  SPECIMEN(S): None  INDICATIONS:  Patient is a 75 year old male who presents with  a septic knee and has developed multisystem organ failure including renal failure. he is unable to provide any history, but his family is aware of the situation.  Risks and benefits were discussed, and informed consent was obtained..  DESCRIPTION: After obtaining full informed written consent, the patient was laid flat in the bed. The right groin was sterilely prepped and draped in a sterile surgical field was created. The right femoral vein was visualized with ultrasound and found to be widely patent although the ultrasound imaging was limited due to his anasarca and poor anatomic landmarks. It was then accessed under direct guidance without difficulty with a Seldinger needle and a permanent image was recorded. A J-wire was then placed. After skin nick and dilatation, a 30 cm double-lumen dialysis catheter was placed over the wire and the wire was removed. The lumens withdrew dark red nonpulsatile blood and flushed easily with sterile saline. The catheter was secured to the skin with 3 nylon sutures. Sterile dressing was placed.  COMPLICATIONS: None  CONDITION: Stable  DEW,JASON 03/04/2016 11:10 AM

## 2016-03-04 NOTE — Progress Notes (Signed)
Orthopaedics Patient still intubated. Wife at bedside. Patient winces with palpation of either knee or lower leg.  AF Right lower extremity: No erythema or significant effusion. Heels benign without skin compromise.  Impression: Septic right knee (Strep pyogenes)  Continue antibiotics. Will continue to follow.  James P. Holley Bouche M.D.

## 2016-03-04 NOTE — Procedures (Signed)
  Procedure Note: Unsuccessful Attempt at Right Femoral Vascath Catheter Placement  John Schultz , [id number] , [id number]  Indications: Hemodynamic monitoring / Intravenous access, hemodialysis/CRRT Benefits, risks (including bleeding, infection,  Injury, etc.), and alternatives explained to wife who voiced understanding.  Questions were sought and answered.   Wife* agreed to proceed with the procedure.  Consent is signed and on chart. A time-out was completed verifying correct patient, procedure and site.  A 2 lumen catheter available at the time of procedure.  The patient was placed in a dependent position appropriate for vascathl line placement based on the vein to be cannulated.   The patient's RIGHT Femoral Vein was prepped and draped in a sterile fashion.  1% Lidocaine WAS NOT used to anesthetize the surrounding skin area.   Able to visualize vessel on U/S, however given edema and position of patient, was unsuccessful at accessing the vessel after 4 attempts. Procedure aborted, vascular surgery (Dr. Lucky Cowboy) consulted, and agreed to evaluate patient for vascath placement.     Vilinda Boehringer, MD [facility] Pager [phone] (please enter 7-digits) On Call Pager - [phone] (please enter 7-digits)

## 2016-03-04 NOTE — Consult Note (Signed)
[facility] Vascular Consult Note  MRN : [id number]  John Schultz is a 75 y.o. (Jul 18, 1941) male who presents with chief complaint of  Chief Complaint  Patient presents with  . Influenza  . Knee Pain  .  History of Present Illness: Patient is admitted to the hospital with A septic knee and has developed acute renal failure.  He developed severe sepsis with multisystem organ failure and is now on pressors on the ventilator.  The patient is critically ill with continuingly increasing BUN and creatinine as well as electrolyte imbalances. The nephrology service has decided to initiate dialysis at this time, and we are asked to place a temporary dialysis catheter for immediate dialysis use.  He can provide no history as he is intubated and sedated. The critical care service has attempted the dialysis catheter without success due to poor anatomic landmarks and we're consult by Dr. Stevenson Clinch to place the line.  Current Facility-Administered Medications  Medication Dose Route Frequency Provider Last Rate Last Dose  . acetaminophen (TYLENOL) tablet 650 mg  650 mg Oral Q6H PRN Idelle Crouch, MD       Or  . acetaminophen (TYLENOL) suppository 650 mg  650 mg Rectal Q6H PRN Idelle Crouch, MD      . antiseptic oral rinse solution (CORINZ)  7 mL Mouth Rinse QID [name], MD   7 mL at 03/04/16 0356  . aspirin chewable tablet 81 mg  81 mg Per Tube Daily [name], MD   81 mg at 03/04/16 1041  . budesonide (PULMICORT) nebulizer solution 0.5 mg  0.5 mg Nebulization BID [name], MD   0.5 mg at 03/04/16 0801  . calcium gluconate 1 g in sodium chloride 0.9 % 100 mL IVPB  1 g Intravenous Once Vishal Mungal, MD      . calcium gluconate 1 g in sodium chloride 0.9 % 100 mL IVPB  1 g Intravenous Once [name], MD      . calcium gluconate 1 g in sodium chloride 0.9 % 100 mL IVPB  1 g Intravenous Once Munsoor Lateef, MD      . chlorhexidine gluconate (SAGE KIT) (PERIDEX) 0.12  % solution 15 mL  15 mL Mouth Rinse BID [name], MD   15 mL at 03/04/16 0800  . famotidine (PEPCID) IVPB 20 mg premix  20 mg Intravenous Q12H [name], MD   20 mg at 03/03/16 2206  . feeding supplement (PRO-STAT SUGAR FREE 64) liquid 30 mL  30 mL Per Tube BID [name], MD   30 mL at 03/03/16 2215  . feeding supplement (VITAL AF 1.2 CAL) liquid 1,000 mL  1,000 mL Per Tube Continuous [name], MD   Stopped at 03/04/16 0830  . fentaNYL 2531mg in NS 2574m(102mml) infusion-PREMIX  40 mcg/hr Intravenous Continuous KurFlora LippsD 2 mL/hr at 03/04/16 0840 20 mcg/hr at 03/04/16 0840  . gabapentin (NEURONTIN) tablet 300 mg  300 mg Per Tube QHS KurFlora LippsD   300 mg at 03/03/16 2214  . haloperidol lactate (HALDOL) injection 1 mg  1 mg Intravenous Q6H PRN RimTheodoro GristD      . heparin injection 1,000-6,000 Units  1,000-6,000 Units CRRT PRN Munsoor Lateef, MD      . heparin injection 5,000 Units  5,000 Units Subcutaneous 3 times per day KurFlora LippsD   5,000 Units at 03/04/16 [id number] hydrocortisone sodium succinate (SOLU-CORTEF) 100 MG injection 50 mg  50 mg  Intravenous Q6H [name], MD   50 mg at 03/04/16 1042  . insulin regular (NOVOLIN R,HUMULIN R) 250 Units in sodium chloride 0.9 % 250 mL (1 Units/mL) infusion   Intravenous Continuous [name], MD 7.9 mL/hr at 03/04/16 1030    . ipratropium-albuterol (DUONEB) 0.5-2.5 (3) MG/3ML nebulizer solution 3 mL  3 mL Nebulization QID Idelle Crouch, MD   3 mL at 03/04/16 0801  . morphine 2 MG/ML injection 2 mg  2 mg Intravenous Q2H PRN Idelle Crouch, MD   2 mg at 02/27/16 0041  . norepinephrine (LEVOPHED) 4 mg in sodium chloride 0.9 % 250 mL (0.016 mg/mL) infusion  0-40 mcg/min Intravenous Titrated Vishal Mungal, MD 7.5 mL/hr at 03/04/16 0730 2 mcg/min at 03/04/16 0730  . ondansetron (ZOFRAN) tablet 4 mg  4 mg Oral Q6H PRN Idelle Crouch, MD       Or  . ondansetron [facility]) injection 4 mg  4 mg Intravenous Q6H PRN Idelle Crouch, MD      . potassium chloride 10 mEq in 50 mL *CENTRAL LINE* IVPB  10 mEq Intravenous Q1 Hr x 4 Vishal Mungal, MD   10 mEq at 03/04/16 1015  . pravastatin (PRAVACHOL) tablet 40 mg  40 mg Oral q1800 Idelle Crouch, MD   40 mg at 03/03/16 1729  . propofol (DIPRIVAN) 1000 MG/100ML infusion  0-50 mcg/kg/min Intravenous Continuous [name], MD 20.3 mL/hr at 03/04/16 0730 40 mcg/kg/min at 03/04/16 0730  . pureflow IV solution for Dialysis   CRRT Continuous Munsoor Lateef, MD      . sodium chloride 0.9 % 250 mL with penicillin G potassium 4 Million Units infusion   Intravenous 4 times per day Lenis Noon, RPH 250 mL/hr at 03/04/16 9211    . sodium chloride flush (NS) 0.9 % injection 10-40 mL  10-40 mL Intracatheter PRN [name], MD      . sodium chloride flush (NS) 0.9 % injection 3 mL  3 mL Intravenous Q12H Idelle Crouch, MD   3 mL at 03/03/16 2230  . vasopressin (PITRESSIN) 40 Units in sodium chloride 0.9 % 250 mL (0.16 Units/mL) infusion  0.03 Units/min Intravenous Continuous [name], MD 11.3 mL/hr at 03/04/16 0903 0.03 Units/min at 03/04/16 9417    Past Medical History  Diagnosis Date  . CHF (congestive heart failure) ([place])   . DM (diabetes mellitus) ([place])   . Pancreatic cyst   . Skin cancer   . DDD (degenerative disc disease), thoracic   . Renal failure   . Respiratory failure [facility])     Past Surgical History  Procedure Laterality Date  . Aortoiliac bypass    . Cholecystectomy    . Kyphoplasty      Social History Social History  Substance Use Topics  . Smoking status: Never Smoker   . Smokeless tobacco: Never Used  . Alcohol Use: No    Family History Family History  Problem Relation Age of Onset  . CAD Mother   . Hypertension Mother   . Hypertension Father   . Prostate cancer Father   . Colon cancer Brother     Allergies  Allergen Reactions  . Tape Rash     REVIEW OF SYSTEMS (Negative unless checked)    Unable to obtain the review of  systems due to the patient's severe systemic illness and altered mental status.       Physical Examination  Filed Vitals:   03/04/16 0730 03/04/16 0800 03/04/16  0900 03/04/16 1000  BP: 112/51 107/50 110/53 103/49  Pulse: 87 87 93 86  Temp: 98.6 F (37 C)     TempSrc: Oral     Resp: _0 Height:      Weight:      SpO2: 97% 97% 97% 99%   Body mass index is 30.64 kg/(m^2). Gen: Intubated, sedated, with diffuse anasarca and a critically ill patient Head: [place]/AT, No temporalis wasting Ear/Nose/Throat:  nares w/o erythema or drainage, trachea midline Eyes: PERRLA, EOMI.  Neck: Supple, no nuchal rigidity.  No JVD.  Pulmonary:  Good air movement on the vent, Coarse breath sounds bilaterally.  Cardiac: RRR, normal S1, S2  Gastrointestinal: soft, non-tender/non-distended. No guarding/reflex.  Musculoskeletal:  No deformity or atrophy. Moderate Edema in the lower extremities bilaterally Neurologic: Intubated, sedated Psychiatric: Difficult to assess due to the severity of patient's illness. Dermatologic: Diffuse anasarca and weeping of his tissues present Lymph : No Cervical, Axillary, or Inguinal lymphadenopathy.      CBC Lab Results  Component Value Date   WBC 11.3* 03/04/2016   HGB 7.4* 03/04/2016   HCT 21.3* 03/04/2016   MCV 87.4 03/04/2016   PLT 130* 03/04/2016    BMET    Component Value Date/Time   NA 125* 03/04/2016 0434   NA 141 03/22/2015 0435   K 3.3* 03/04/2016 0434   K 4.0 03/22/2015 0435   CL 89* 03/04/2016 0434   CL 92* 03/22/2015 0435   CO2 24 03/04/2016 0434   CO2 40* 03/22/2015 0435   GLUCOSE 152* 03/04/2016 0434   GLUCOSE 213* 03/22/2015 0435   BUN 92* 03/04/2016 0434   BUN 46* 03/22/2015 0435   CREATININE 2.11* 03/04/2016 0434   CREATININE 1.44* 03/22/2015 0435   CALCIUM 5.5* 03/04/2016 0434   CALCIUM 9.0 03/22/2015 0435   GFRNONAA 29* 03/04/2016 0434   GFRNONAA 48* 03/22/2015 0435   GFRAA 34* 03/04/2016 0434   GFRAA 56* 03/22/2015  0435   Estimated Creatinine Clearance: 38.6 mL/min (by C-G formula based on Cr of 2.11).  COAG Lab Results  Component Value Date   INR 1.40 02/27/2016   INR 1.0 03/21/2015    Radiology Dg Chest 2 View  02/25/2016  CLINICAL DATA:  Nausea, vomiting, diarrhea and cough. Also weakness, congestion, fever and chills. History of CHF, diabetes. EXAM: CHEST  2 VIEW COMPARISON:  Chest x-rays dated 04/24/2015 and 03/20/2015. FINDINGS: Study is again hypoinspiratory with crowding of the perihilar bronchovascular markings. Given the low lung volumes, lungs appear clear. Cardiomediastinal silhouette is stable in size and configuration. Median sternotomy wires are stable in alignment. Multiple old compression fracture deformities are seen within the upper lumbar spine, 1 of which is status post previous vertebroplasty. No evidence of acute osseous abnormality. Surgical clips again noted within the upper abdomen. IMPRESSION: Low lung volumes. No evidence of acute cardiopulmonary abnormality. No evidence of pneumonia. Electronically Signed   By: Franki Cabot M.D.   On: 03/15/2016 13:40   Dg Abd 1 View  03/03/2016  CLINICAL DATA:  Diarrhea EXAM: ABDOMEN - 1 VIEW COMPARISON:  February 27, 2016 FINDINGS: An NG tube terminates in left upper quadrant. No free air, portal venous gas, or pneumatosis. No acute abnormalities. IMPRESSION: No acute abnormalities. Electronically Signed   By: Dorise Bullion III M.D   On: 03/03/2016 11:33   Dg Abd 1 View  02/27/2016  CLINICAL DATA:  Nasogastric tube placement EXAM: ABDOMEN - 1 VIEW COMPARISON:  02/27/2016 at 17:08 FINDINGS: Nasogastric tube is  incompletely imaged but it extends into the stomach and appears to curl up into the fundus although the tip is off the superior edge of the image. Visible abdominal gas pattern is grossly unremarkable. IMPRESSION: Nasogastric tube reaches the stomach, tip is probably in the fundus. Electronically Signed   By: Andreas Newport M.D.   On:  02/27/2016 22:02   Dg Abd 1 View  02/27/2016  CLINICAL DATA:  OG tube placement EXAM: ABDOMEN - 1 VIEW COMPARISON:  Chest x-ray same day FINDINGS: There is normal small bowel gas pattern. Surgical clips are noted in right and left upper abdomen. Prior vertebroplasty noted upper lumbar spine. The patient is status post median sternotomy. No NG tube is identified. Partially visualized endotracheal tube with tip about 3 cm above the carina. Right IJ central line is unchanged in position. IMPRESSION: The patient is status post median sternotomy. No NG tube is identified. Clinical correlation is necessary. Partially visualized endotracheal tube with tip about 3 cm above the carina. Right IJ central line is unchanged in position. I discussed with patient's nurse in ICU, Diane Electronically Signed   By: Lahoma Crocker M.D.   On: 02/27/2016 17:47   Dg Chest Port 1 View  03/03/2016  CLINICAL DATA:  Ventilator dependent respiratory failure. Followup basilar atelectasis. EXAM: PORTABLE CHEST 1 VIEW COMPARISON:  02/27/2016 and earlier. FINDINGS: Endotracheal tube tip in satisfactory position projecting approximately 4 cm above the carina. Right jugular central venous catheter tip projects over the mid SVC, unchanged. Nasogastric tube looped in the stomach with its tip in the fundus. Markedly suboptimal inspiration with atelectasis in the lung bases, left greater than right, unchanged since the examination 5 days ago. Prior sternotomy CABG. Cardiac silhouette moderately enlarged, unchanged. Pulmonary venous hypertension, increased since the prior examination, without overt edema currently. No new pulmonary parenchymal abnormalities. IMPRESSION: 1. Support apparatus satisfactory. 2. Markedly suboptimal inspiration with bibasilar atelectasis, left greater than right, stable since the examination 5 days ago. 3. Stable cardiomegaly. Pulmonary venous hypertension, increased since the examination 5 days ago, without overt edema  currently. Electronically Signed   By: Evangeline Dakin M.D.   On: 03/03/2016 09:35   Dg Chest Port 1 View  02/27/2016  CLINICAL DATA:  Endotracheal tube placement, respiratory failure. EXAM: PORTABLE CHEST 1 VIEW COMPARISON:  Same day. FINDINGS: Stable cardiomediastinal silhouette. Status post coronary artery bypass graft. Endotracheal tube is seen in grossly good position projected over tracheal air shadow with distal tip 3 cm above the carina. Atherosclerosis of thoracic aorta is noted. Hypoinflation of the lungs is noted. Mild bibasilar subsegmental atelectasis is noted with associated left pleural effusion. No pneumothorax is noted. Right internal jugular catheter is unchanged in position. IMPRESSION: Endotracheal tube in grossly good position. Hypoinflation of the lungs is noted. Stable bibasilar subsegmental atelectasis with associated mild left pleural effusion. Electronically Signed   By: Marijo Conception, M.D.   On: 02/27/2016 16:23   Dg Chest Port 1 View  02/27/2016  CLINICAL DATA:  Central line placement.  Initial encounter. EXAM: PORTABLE CHEST 1 VIEW COMPARISON:  Chest radiograph performed earlier today at 3:52 a.m. FINDINGS: A right IJ line is noted ending about the mid to distal SVC. The lungs are hypoexpanded. Bibasilar airspace opacities may reflect atelectasis or possibly pneumonia. Mild vascular crowding is noted. Small bilateral pleural effusions are suspected. No pneumothorax is seen. The cardiomediastinal silhouette is mildly enlarged. The patient is status post median sternotomy, with evidence of prior CABG. No acute osseous abnormalities are  identified. Scattered clips are noted about the upper abdomen. IMPRESSION: 1. Right IJ line noted ending about the mid to distal SVC. 2. Lungs hypoexpanded. Bibasilar airspace opacities may reflect atelectasis or possibly pneumonia. 3. Suspect small bilateral pleural effusions.  Mild cardiomegaly. Electronically Signed   By: Garald Balding M.D.   On:  02/27/2016 05:57   Dg Chest Port 1 View  02/27/2016  CLINICAL DATA:  Acute onset of respiratory failure. Initial encounter. EXAM: PORTABLE CHEST 1 VIEW COMPARISON:  Chest radiograph performed 03/09/2016 FINDINGS: The lungs are hypoexpanded. Bibasilar airspace opacities may reflect atelectasis or pneumonia. No definite pleural effusion or pneumothorax is seen. The cardiomediastinal silhouette is borderline normal in size. The patient is status post median sternotomy, with evidence of prior CABG. Scattered clips are noted at the upper abdomen. No acute osseous abnormalities are identified. IMPRESSION: Lungs hypoexpanded. Bibasilar airspace opacities may reflect atelectasis or pneumonia. Electronically Signed   By: Garald Balding M.D.   On: 02/27/2016 04:04   Dg Knee Complete 4 Views Right  03/21/2016  CLINICAL DATA:  Acute knee pain.  Initial encounter. EXAM: RIGHT KNEE - COMPLETE 4+ VIEW COMPARISON:  None. FINDINGS: There is no evidence of acute fracture, subluxation or dislocation. A small knee effusion is present. No focal bony lesions are present. Heavy vascular calcifications are present. IMPRESSION: Small knee effusion without acute bony abnormality. Heavy vascular calcifications. Electronically Signed   By: Margarette Canada M.D.   On: 02/28/2016 13:39      Assessment/Plan 1. ARF 2. Multisystem organ failure with respiratory insufficiency and hypotension requiring pressors. This is likely all secondary to sepsis from his septic knee. His overall prognosis is very poor. Critical care service and multiple other physicians following.   We will proceed with temporary dialysis catheter placement at this time.  Risks and benefits discussed with patient and/or family, and the catheter will be placed to allow immediate initiation of dialysis.  If the patient's renal function does not improve throughout the hospital course, we will be happy to place a tunneled dialysis catheter for long term use prior to  discharge.     Meagen Limones, MD  03/04/2016 11:06 AM

## 2016-03-05 ENCOUNTER — Inpatient Hospital Stay: Payer: Commercial Managed Care - HMO

## 2016-03-05 LAB — GLUCOSE, CAPILLARY
GLUCOSE-CAPILLARY: 148 mg/dL — AB (ref 65–99)
GLUCOSE-CAPILLARY: 151 mg/dL — AB (ref 65–99)
GLUCOSE-CAPILLARY: 153 mg/dL — AB (ref 65–99)
GLUCOSE-CAPILLARY: 164 mg/dL — AB (ref 65–99)
GLUCOSE-CAPILLARY: 166 mg/dL — AB (ref 65–99)
GLUCOSE-CAPILLARY: 166 mg/dL — AB (ref 65–99)
GLUCOSE-CAPILLARY: 169 mg/dL — AB (ref 65–99)
GLUCOSE-CAPILLARY: 170 mg/dL — AB (ref 65–99)
GLUCOSE-CAPILLARY: 177 mg/dL — AB (ref 65–99)
GLUCOSE-CAPILLARY: 179 mg/dL — AB (ref 65–99)
GLUCOSE-CAPILLARY: 218 mg/dL — AB (ref 65–99)
Glucose-Capillary: 146 mg/dL — ABNORMAL HIGH (ref 65–99)
Glucose-Capillary: 148 mg/dL — ABNORMAL HIGH (ref 65–99)
Glucose-Capillary: 153 mg/dL — ABNORMAL HIGH (ref 65–99)
Glucose-Capillary: 159 mg/dL — ABNORMAL HIGH (ref 65–99)
Glucose-Capillary: 170 mg/dL — ABNORMAL HIGH (ref 65–99)
Glucose-Capillary: 182 mg/dL — ABNORMAL HIGH (ref 65–99)
Glucose-Capillary: 214 mg/dL — ABNORMAL HIGH (ref 65–99)

## 2016-03-05 LAB — RENAL FUNCTION PANEL
ALBUMIN: 1.3 g/dL — AB (ref 3.5–5.0)
ALBUMIN: 1.3 g/dL — AB (ref 3.5–5.0)
ALBUMIN: 1.3 g/dL — AB (ref 3.5–5.0)
ANION GAP: 5 (ref 5–15)
ANION GAP: 5 (ref 5–15)
ANION GAP: 4 — AB (ref 5–15)
Albumin: 1.3 g/dL — ABNORMAL LOW (ref 3.5–5.0)
Albumin: 1.3 g/dL — ABNORMAL LOW (ref 3.5–5.0)
Albumin: 1.3 g/dL — ABNORMAL LOW (ref 3.5–5.0)
Anion gap: 5 (ref 5–15)
Anion gap: 6 (ref 5–15)
Anion gap: 2 — ABNORMAL LOW (ref 5–15)
BUN: 58 mg/dL — ABNORMAL HIGH (ref 6–20)
BUN: 50 mg/dL — AB (ref 6–20)
BUN: 46 mg/dL — AB (ref 6–20)
BUN: 46 mg/dL — ABNORMAL HIGH (ref 6–20)
BUN: 42 mg/dL — AB (ref 6–20)
BUN: 42 mg/dL — AB (ref 6–20)
CALCIUM: 7.4 mg/dL — AB (ref 8.9–10.3)
CHLORIDE: 102 mmol/L (ref 101–111)
CHLORIDE: 102 mmol/L (ref 101–111)
CHLORIDE: 103 mmol/L (ref 101–111)
CO2: 25 mmol/L (ref 22–32)
CO2: 26 mmol/L (ref 22–32)
CO2: 26 mmol/L (ref 22–32)
CO2: 27 mmol/L (ref 22–32)
CO2: 25 mmol/L (ref 22–32)
CO2: 28 mmol/L (ref 22–32)
CREATININE: 1.03 mg/dL (ref 0.61–1.24)
CREATININE: 0.86 mg/dL (ref 0.61–1.24)
Calcium: 6.6 mg/dL — ABNORMAL LOW (ref 8.9–10.3)
Calcium: 6.8 mg/dL — ABNORMAL LOW (ref 8.9–10.3)
Calcium: 7 mg/dL — ABNORMAL LOW (ref 8.9–10.3)
Calcium: 7.3 mg/dL — ABNORMAL LOW (ref 8.9–10.3)
Calcium: 7.5 mg/dL — ABNORMAL LOW (ref 8.9–10.3)
Chloride: 102 mmol/L (ref 101–111)
Chloride: 103 mmol/L (ref 101–111)
Chloride: 105 mmol/L (ref 101–111)
Creatinine, Ser: 1.2 mg/dL (ref 0.61–1.24)
Creatinine, Ser: 1.01 mg/dL (ref 0.61–1.24)
Creatinine, Ser: 0.9 mg/dL (ref 0.61–1.24)
Creatinine, Ser: 0.92 mg/dL (ref 0.61–1.24)
GFR calc Af Amer: >60 mL/min (ref >60)
GFR calc non Af Amer: >60 mL/min (ref >60)
GFR calc non Af Amer: >60 mL/min (ref >60)
GFR, EST NON AFRICAN AMERICAN: 58 mL/min — AB (ref >60)
GLUCOSE: 229 mg/dL — AB (ref 65–99)
Glucose, Bld: 174 mg/dL — ABNORMAL HIGH (ref 65–99)
Glucose, Bld: 148 mg/dL — ABNORMAL HIGH (ref 65–99)
Glucose, Bld: 170 mg/dL — ABNORMAL HIGH (ref 65–99)
Glucose, Bld: 180 mg/dL — ABNORMAL HIGH (ref 65–99)
Glucose, Bld: 257 mg/dL — ABNORMAL HIGH (ref 65–99)
PHOSPHORUS: 2.8 mg/dL (ref 2.5–4.6)
PHOSPHORUS: 2.4 mg/dL — AB (ref 2.5–4.6)
POTASSIUM: 4 mmol/L (ref 3.5–5.1)
POTASSIUM: 4 mmol/L (ref 3.5–5.1)
POTASSIUM: 4.1 mmol/L (ref 3.5–5.1)
Phosphorus: 2.8 mg/dL (ref 2.5–4.6)
Phosphorus: 2.5 mg/dL (ref 2.5–4.6)
Phosphorus: 2.5 mg/dL (ref 2.5–4.6)
Phosphorus: 2.3 mg/dL — ABNORMAL LOW (ref 2.5–4.6)
Potassium: 4 mmol/L (ref 3.5–5.1)
Potassium: 4.3 mmol/L (ref 3.5–5.1)
Potassium: 3.9 mmol/L (ref 3.5–5.1)
SODIUM: 133 mmol/L — AB (ref 135–145)
SODIUM: 134 mmol/L — AB (ref 135–145)
SODIUM: 135 mmol/L (ref 135–145)
Sodium: 132 mmol/L — ABNORMAL LOW (ref 135–145)
Sodium: 133 mmol/L — ABNORMAL LOW (ref 135–145)
Sodium: 134 mmol/L — ABNORMAL LOW (ref 135–145)

## 2016-03-05 LAB — MAGNESIUM
MAGNESIUM: 2 mg/dL (ref 1.7–2.4)
MAGNESIUM: 2 mg/dL (ref 1.7–2.4)
MAGNESIUM: 1.9 mg/dL (ref 1.7–2.4)
Magnesium: 1.9 mg/dL (ref 1.7–2.4)
Magnesium: 2 mg/dL (ref 1.7–2.4)
Magnesium: 2 mg/dL (ref 1.7–2.4)

## 2016-03-05 LAB — CULTURE, BLOOD (SINGLE): CULTURE: NO GROWTH

## 2016-03-05 LAB — CBC
HEMATOCRIT: 23.1 % — AB (ref 40.0–52.0)
HEMOGLOBIN: 8.2 g/dL — AB (ref 13.0–18.0)
MCH: 31.2 pg (ref 26.0–34.0)
MCHC: 35.5 g/dL (ref 32.0–36.0)
MCV: 87.9 fL (ref 80.0–100.0)
Platelets: 110 10*3/uL — ABNORMAL LOW (ref 150–440)
RBC: 2.63 MIL/uL — AB (ref 4.40–5.90)
RDW: 14.6 % — ABNORMAL HIGH (ref 11.5–14.5)
WBC: 7.9 10*3/uL (ref 3.8–10.6)

## 2016-03-05 LAB — TRIGLYCERIDES: TRIGLYCERIDES: 117 mg/dL (ref <150)

## 2016-03-05 MED ORDER — FAMOTIDINE 20 MG PO TABS
20.0000 mg | ORAL_TABLET | ORAL | Status: DC
  Administered 2016-03-05: 20 mg via ORAL
  Filled 2016-03-05: qty 1

## 2016-03-05 MED ORDER — INSULIN REGULAR HUMAN 100 UNIT/ML IJ SOLN
INTRAMUSCULAR | Status: DC
  Filled 2016-03-05: qty 2.5

## 2016-03-05 MED ORDER — NOREPINEPHRINE BITARTRATE 1 MG/ML IV SOLN
0.0000 ug/min | INTRAVENOUS | Status: DC
  Administered 2016-03-05: 1 ug/min via INTRAVENOUS
  Filled 2016-03-05: qty 16

## 2016-03-05 MED ORDER — IPRATROPIUM-ALBUTEROL 0.5-2.5 (3) MG/3ML IN SOLN
3.0000 mL | Freq: Four times a day (QID) | RESPIRATORY_TRACT | Status: DC
  Administered 2016-03-05 – 2016-03-20 (×60): 3 mL via RESPIRATORY_TRACT
  Filled 2016-03-05 (×59): qty 3

## 2016-03-05 MED ORDER — FAMOTIDINE 20 MG PO TABS
20.0000 mg | ORAL_TABLET | ORAL | Status: DC
  Administered 2016-03-06 – 2016-03-07 (×2): 20 mg
  Filled 2016-03-05 (×2): qty 1

## 2016-03-05 MED ORDER — NOREPINEPHRINE 4 MG/250ML-% IV SOLN
INTRAVENOUS | Status: AC
  Filled 2016-03-05: qty 250

## 2016-03-05 MED ORDER — INSULIN ASPART 100 UNIT/ML ~~LOC~~ SOLN
0.0000 [IU] | SUBCUTANEOUS | Status: DC
  Administered 2016-03-05: 7 [IU] via SUBCUTANEOUS
  Administered 2016-03-05 (×2): 4 [IU] via SUBCUTANEOUS
  Administered 2016-03-05: 7 [IU] via SUBCUTANEOUS
  Administered 2016-03-06 (×2): 4 [IU] via SUBCUTANEOUS
  Administered 2016-03-06 (×2): 7 [IU] via SUBCUTANEOUS
  Administered 2016-03-06: 4 [IU] via SUBCUTANEOUS
  Administered 2016-03-06: 3 [IU] via SUBCUTANEOUS
  Administered 2016-03-07: 7 [IU] via SUBCUTANEOUS
  Administered 2016-03-07: 10 [IU] via SUBCUTANEOUS
  Administered 2016-03-07: 3 [IU] via SUBCUTANEOUS
  Filled 2016-03-05: qty 7
  Filled 2016-03-05 (×4): qty 4
  Filled 2016-03-05: qty 7
  Filled 2016-03-05: qty 4
  Filled 2016-03-05 (×4): qty 7
  Filled 2016-03-05: qty 3
  Filled 2016-03-05: qty 4

## 2016-03-05 MED ORDER — ACETAMINOPHEN 650 MG RE SUPP: 650.0000 mg | Freq: Four times a day (QID) | RECTAL | Status: DC | PRN

## 2016-03-05 MED ORDER — INSULIN GLARGINE 100 UNIT/ML ~~LOC~~ SOLN
20.0000 [IU] | Freq: Every day | SUBCUTANEOUS | Status: DC
  Administered 2016-03-05 – 2016-03-06 (×2): 20 [IU] via SUBCUTANEOUS
  Filled 2016-03-05 (×3): qty 0.2

## 2016-03-05 MED ORDER — ACETAMINOPHEN 325 MG PO TABS: 650.0000 mg | ORAL_TABLET | Freq: Four times a day (QID) | ORAL | Status: DC | PRN

## 2016-03-05 MED ORDER — HYDROCORTISONE NA SUCCINATE PF 100 MG IJ SOLR
50.0000 mg | Freq: Two times a day (BID) | INTRAMUSCULAR | Status: DC
  Administered 2016-03-05: 50 mg via INTRAVENOUS
  Filled 2016-03-05 (×2): qty 2

## 2016-03-05 NOTE — Care Management (Signed)
Crrt initiated in hopes of pulling off excess fluid which will assist  to have a successful extubation per intensivist

## 2016-03-05 NOTE — Progress Notes (Signed)
Subjective:  Patient progressing well with continuous renal replacement therapy. Electrolytes and azotemia have improved. Urine output was 560 cc over the preceding 24 hours.   Objective:  Vital signs in last 24 hours:  Temp:  [94.5 F (34.7 C)-98.4 F (36.9 C)] 97.5 F (36.4 C) (04/10 0800) Pulse Rate:  [62-84] 74 (04/10 0800) Resp:  [0-23] 0 (04/10 0800) BP: (96-134)/(44-63) 104/50 mmHg (04/10 0800) SpO2:  [95 %-100 %] 99 % (04/10 0800) FiO2 (%):  [30 %] 30 % (04/10 0749) Weight:  [103.1 kg (227 lb 4.7 oz)] 103.1 kg (227 lb 4.7 oz) (04/10 0430)  Weight change: 0.6 kg (1 lb 5.2 oz) Filed Weights   03/03/16 0500 03/04/16 0500 03/05/16 0430  Weight: 101.5 kg (223 lb 12.3 oz) 102.5 kg (225 lb 15.5 oz) 103.1 kg (227 lb 4.7 oz)    Intake/Output:    Intake/Output Summary (Last 24 hours) at 03/05/16 1015 Last data filed at 03/05/16 0946  Gross per 24 hour  Intake 3963.34 ml  Output   1467 ml  Net 2496.34 ml     Physical Exam: General: Critically ill appearing, lethargic  HEENT ETT  Neck supple  Pulm/lungs Vent assisted bilateral rhonchi  CVS/Heart Regular, soft systolic murmur  Abdomen:  Soft, non distended, BS present  Extremities: No peripheral edema, rt knee drain    Neurologic: Sedated,  Not following commands  Skin: Heel skin breakdown  GU: Foley       Basic Metabolic Panel:   Recent Labs Lab 03/04/16 1442 03/04/16 1847 03/04/16 2210 03/05/16 0218 03/05/16 0557 03/05/16 0558  NA 129* 130* 130* 132* 133*  --   K 3.6 3.8 3.7 4.0 4.0  --   CL 95* 98* 99* 102 102  --   CO2 23 24 24 25 26   --   GLUCOSE 153* 177* 185* 174* 148*  --   BUN 79* 70* 65* 58* 50*  --   CREATININE 1.74* 1.48* 1.35* 1.20 1.01  --   CALCIUM 6.1* 6.4* 6.5* 6.6* 6.8*  --   MG 2.0 2.0 2.0 2.0  --  2.0  PHOS 3.6 3.4 3.1 2.8 2.8  --      CBC:  Recent Labs Lab 03/01/16 0744 03/02/16 0740 03/03/16 0506 03/03/16 1049 03/04/16 0530 03/05/16 0557  WBC 15.7* 12.8* 11.8*  --   11.3* 7.9  HGB 9.1* 8.2* 7.4* 7.4* 7.4* 8.2*  HCT 27.3* 24.6* 21.8* 21.2* 21.3* 23.1*  MCV 91.5 90.9 87.9  --  87.4 87.9  PLT 178 148* 139*  --  130* 110*      Microbiology:  Recent Results (from the past 720 hour(s))  Blood culture (routine x 2)     Status: Abnormal   Collection Time: 03/21/2016 12:59 PM  Result Value Ref Range Status   Specimen Description BLOOD LEFT FOREARM  Final   Special Requests BOTTLES DRAWN AEROBIC AND ANAEROBIC  1CC  Final   Culture  Setup Time   Final    GRAM POSITIVE COCCI AEROBIC BOTTLE ONLY CRITICAL VALUE NOTED.  VALUE IS CONSISTENT WITH PREVIOUSLY REPORTED AND CALLED VALUE.    Culture STREPTOCOCCUS PYOGENES AEROBIC BOTTLE ONLY  (A)  Final   Report Status 03/03/2016 FINAL  Final   Organism ID, Bacteria STREPTOCOCCUS PYOGENES  Final      Susceptibility   Streptococcus pyogenes - MIC*    CLINDAMYCIN Value in next row Sensitive      SENSITIVE0.25    AMPICILLIN Value in next row Sensitive  SENSITIVE0.25    ERYTHROMYCIN Value in next row Sensitive      SENSITIVE0.12    VANCOMYCIN Value in next row Sensitive      SENSITIVE0.5    CEFTRIAXONE Value in next row Sensitive      SENSITIVE0.12    LEVOFLOXACIN Value in next row Sensitive      SENSITIVE0.5    * STREPTOCOCCUS PYOGENES  Rapid Influenza A&B Antigens ([facility] only)     Status: None   Collection Time: 03/01/2016  1:00 PM  Result Value Ref Range Status   Influenza A ([place]) NEGATIVE NEGATIVE Final   Influenza B ([facility]) NEGATIVE NEGATIVE Final  Blood culture (routine x 2)     Status: None   Collection Time: 03/10/2016  1:40 PM  Result Value Ref Range Status   Specimen Description BLOOD LEFT FOREARM  Final   Special Requests BOTTLES DRAWN AEROBIC AND ANAEROBIC  4CC  Final   Culture  Setup Time   Final    GRAM POSITIVE COCCI IN BOTH AEROBIC AND ANAEROBIC BOTTLES CRITICAL RESULT CALLED TO, READ BACK BY AND VERIFIED WITH: [place] AT 6195 02/27/16 CAF    Culture   Final    STREPTOCOCCUS  PYOGENES IN BOTH AEROBIC AND ANAEROBIC BOTTLES    Report Status 02/29/2016 FINAL  Final   Organism ID, Bacteria STREPTOCOCCUS PYOGENES  Final      Susceptibility   Streptococcus pyogenes - MIC*    CLINDAMYCIN Value in next row Sensitive      SENSITIVE<=0.25    AMPICILLIN Value in next row Sensitive      SENSITIVE<=0.25    ERYTHROMYCIN Value in next row Sensitive      SENSITIVE0.12    VANCOMYCIN Value in next row Sensitive      SENSITIVE0.5    CEFTRIAXONE Value in next row Sensitive      SENSITIVE0.12    LEVOFLOXACIN Value in next row Sensitive      SENSITIVE0.5    * STREPTOCOCCUS PYOGENES  Blood Culture ID Panel (Reflexed)     Status: Abnormal   Collection Time: 03/13/2016  1:40 PM  Result Value Ref Range Status   Enterococcus species NOT DETECTED NOT DETECTED Final   Vancomycin resistance NOT DETECTED NOT DETECTED Final   Listeria monocytogenes NOT DETECTED NOT DETECTED Final   Staphylococcus species NOT DETECTED NOT DETECTED Final   Staphylococcus aureus NOT DETECTED NOT DETECTED Final   Methicillin resistance NOT DETECTED NOT DETECTED Final   Streptococcus species NOT DETECTED NOT DETECTED Final   Streptococcus agalactiae NOT DETECTED NOT DETECTED Final   Streptococcus pneumoniae NOT DETECTED NOT DETECTED Final   Streptococcus pyogenes DETECTED (A) NOT DETECTED Final    Comment: CRITICAL RESULT CALLED TO, READ BACK BY AND VERIFIED WITH: NATE COOKSON AT 0426 02/27/16 CAF    Acinetobacter baumannii NOT DETECTED NOT DETECTED Final   Enterobacteriaceae species NOT DETECTED NOT DETECTED Final   Enterobacter cloacae complex NOT DETECTED NOT DETECTED Final   Escherichia coli NOT DETECTED NOT DETECTED Final   Klebsiella oxytoca NOT DETECTED NOT DETECTED Final   Klebsiella pneumoniae NOT DETECTED NOT DETECTED Final   Proteus species NOT DETECTED NOT DETECTED Final   Serratia marcescens NOT DETECTED NOT DETECTED Final   Carbapenem resistance NOT DETECTED NOT DETECTED Final    Haemophilus influenzae NOT DETECTED NOT DETECTED Final   Neisseria meningitidis NOT DETECTED NOT DETECTED Final   Pseudomonas aeruginosa NOT DETECTED NOT DETECTED Final   Candida albicans NOT DETECTED NOT DETECTED Final   Candida glabrata  NOT DETECTED NOT DETECTED Final   Candida krusei NOT DETECTED NOT DETECTED Final   Candida parapsilosis NOT DETECTED NOT DETECTED Final   Candida tropicalis NOT DETECTED NOT DETECTED Final  Body fluid culture     Status: None   Collection Time: 03/22/2016  3:05 PM  Result Value Ref Range Status   Specimen Description R Knee  Final   Special Requests NONE  Final   Gram Stain   Final    MODERATE WBC SEEN MODERATE GRAM POSITIVE COCCI IN PAIRS AND CHAINS    Culture   Final    HEAVY GROWTH GROUP A STREP (S.PYOGENES) ISOLATED There is no known Penicillin Resistant Beta Streptococcus in the [place] For patients that are Penicillin-allergic, Erythromycin is 85-94% susceptible, and Clindamycin is 80% susceptible.  Contact Microbiology within 7 days if sensitivity testing is  required.   NO ANAEROBES ISOLATED    Report Status 02/29/2016 FINAL  Final  Body fluid culture     Status: None   Collection Time: 03/23/2016  4:10 PM  Result Value Ref Range Status   Specimen Description R Knee  Final   Special Requests NONE  Final   Gram Stain   Final    MANY WBC SEEN MANY GRAM POSITIVE COCCI IN PAIRS AND CHAINS    Culture   Final    HEAVY GROWTH GROUP A STREP (S.PYOGENES) ISOLATED There is no known Penicillin Resistant Beta Streptococcus in the [place] For patients that are Penicillin-allergic, Erythromycin is 85-94% susceptible, and Clindamycin is 80% susceptible.  Contact Microbiology within 7 days if sensitivity testing is  required.   NO ANAEROBES ISOLATED    Report Status 02/29/2016 FINAL  Final  MRSA PCR Screening     Status: None   Collection Time: 02/27/16  3:35 AM  Result Value Ref Range Status   MRSA by PCR NEGATIVE NEGATIVE Final    Comment:        The  GeneXpert MRSA Assay (FDA approved for NASAL specimens only), is one component of a comprehensive MRSA colonization surveillance program. It is not intended to diagnose MRSA infection nor to guide or monitor treatment for MRSA infections.   Urine culture     Status: None   Collection Time: 02/27/16  8:30 AM  Result Value Ref Range Status   Specimen Description URINE, RANDOM  Final   Special Requests NONE  Final   Culture NO GROWTH 1 DAY  Final   Report Status 02/28/2016 FINAL  Final  Culture, blood (single) w Reflex to PCR ID Panel     Status: None   Collection Time: 02/29/16  2:36 PM  Result Value Ref Range Status   Specimen Description BLOOD LEFT HAND  Final   Special Requests BOTTLES DRAWN AEROBIC AND ANAEROBIC [place]  Final   Culture NO GROWTH 5 DAYS  Final   Report Status 03/05/2016 FINAL  Final    Coagulation Studies: No results for input(s): LABPROT, INR in the last 72 hours.  Urinalysis: No results for input(s): COLORURINE, LABSPEC, PHURINE, GLUCOSEU, HGBUR, BILIRUBINUR, KETONESUR, PROTEINUR, UROBILINOGEN, NITRITE, LEUKOCYTESUR in the last 72 hours.  Invalid input(s): APPERANCEUR    Imaging: Dg Abd 1 View  03/03/2016  CLINICAL DATA:  Diarrhea EXAM: ABDOMEN - 1 VIEW COMPARISON:  February 27, 2016 FINDINGS: An NG tube terminates in left upper quadrant. No free air, portal venous gas, or pneumatosis. No acute abnormalities. IMPRESSION: No acute abnormalities. Electronically Signed   By: Dorise Bullion III M.D   On: 03/03/2016 11:33  Dg Chest Port 1 View  03/05/2016  CLINICAL DATA:  Acute respiratory failure, history of CHF, diabetes, and renal failure. EXAM: PORTABLE CHEST 1 VIEW COMPARISON:  Portable chest x-ray of March 04, 2016 FINDINGS: The lungs are mildly hypoinflated. Bibasilar parenchymal density with obscuration of the hemidiaphragms persists. The cardiac silhouette is top-normal in size. The central pulmonary vascularity is mildly prominent. There is  evidence of previous CABG. The endotracheal tube tip projects 3.8 cm above the carina. The esophagogastric tube tip is in stable position in the gastric cardia or hiatal hernia. The right internal jugular venous catheter tip projects over the midportion of the SVC IMPRESSION: Persistent bibasilar atelectasis or pneumonia and small bilateral pleural effusions. Improved appearance of the pulmonary vascularity in the mid and upper lungs may reflect improving CHF. Electronically Signed   By: David  Martinique M.D.   On: 03/05/2016 07:26   Dg Chest Port 1 View  03/04/2016  CLINICAL DATA:  75 year old admitted with septic arthritis involving the knee, now with ventilator dependent respiratory failure. Followup basilar atelectasis. EXAM: PORTABLE CHEST 1 VIEW COMPARISON:  03/03/2016 and earlier. FINDINGS: Endotracheal tube tip in satisfactory position projecting approximately 3-4 cm above the carina. Right subclavian central venous catheter tip projects over the upper SVC, unchanged. Nasogastric tube is looped in in the stomach which is in a hiatal hernia. Further worsening of aeration in the lower lobes since yesterday. Evidence of mild interstitial pulmonary edema currently. Possible small bilateral pleural effusions. IMPRESSION: 1. Support apparatus satisfactory. Nasogastric tube looped in the stomach which is in a hiatal hernia. 2. New mild diffuse interstitial pulmonary edema and possible small bilateral pleural effusions indicating CHF and/or fluid overload. 3. Worsening bibasilar atelectasis. Electronically Signed   By: Evangeline Dakin M.D.   On: 03/04/2016 12:22     Medications:   . feeding supplement (VITAL AF 1.2 CAL) 1,000 mL (03/05/16 0700)  . fentaNYL infusion INTRAVENOUS 35 mcg/hr (03/05/16 0946)  . insulin (NOVOLIN-R) infusion 7.4 Units/hr (03/05/16 0946)  . propofol (DIPRIVAN) infusion 20 mcg/kg/min (03/05/16 0946)  . pureflow 3 each (03/04/16 1719)  . vasopressin (PITRESSIN) infusion - *FOR  SHOCK* Stopped (03/05/16 0800)   . antiseptic oral rinse  7 mL Mouth Rinse QID  . aspirin  81 mg Per Tube Daily  . budesonide (PULMICORT) nebulizer solution  0.5 mg Nebulization BID  . chlorhexidine gluconate (SAGE KIT)  15 mL Mouth Rinse BID  . famotidine  20 mg Oral Q24H  . feeding supplement (PRO-STAT SUGAR FREE 64)  30 mL Per Tube BID  . gabapentin  300 mg Per Tube QHS  . heparin subcutaneous  5,000 Units Subcutaneous 3 times per day  . hydrocortisone sod succinate (SOLU-CORTEF) inj  50 mg Intravenous Q6H  . ipratropium-albuterol  3 mL Nebulization QID  . pravastatin  40 mg Oral q1800  . sodium chloride 0.9 % 250 mL with penicillin G potassium 3 Million Units infusion   Intravenous 4 times per day  . sodium chloride flush  3 mL Intravenous Q12H   acetaminophen **OR** acetaminophen, haloperidol lactate, heparin, morphine injection, ondansetron **OR** ondansetron (ZOFRAN) IV, sodium chloride flush  Assessment/ Plan:  75 y.o. Caucasian male  with significant medical history of coronary disease with four-vessel CABG in 1998, gallbladder rupture, extensive surgery, Diabetes with complications of retinopathy, peripheral neuropathy, peripheral vascular disease, Aorto iliac bypass, angioplasty and stent in his left leg, kyphoplasty for chronic back pain, CKD st 3 with Baseline Cr 1.5/ GFR 46  1. ARF on CKD  st 3, likely ATN 2. Rt Knee septic arthritis 3. NSTEMI 4. DM-2 with CKD 5. Hypotension 6. Acute resp faliure. Intubated 4/3 7. Hypokalemia 8. Hyponatremia 9. Septic shock. 10. Metabolic Acidosis.  Plan: Patient remains critically ill at this point in time. Urine output was 560 cc over the preceding 24 hours. Therefore we will continue continuous renal replacement therapy at this time. There has been improvement in his metabolic parameters. We will add ultrafiltration of 50 cc per hour. Patient will be continued on ventilatory support. Overall prognosis appears to be quite guarded  however. We will continue to monitor along closely with you.   LOS: 8 Hira Trent 4/10/201710:15 AM

## 2016-03-05 NOTE — Progress Notes (Signed)
Pt's CRRTalarms went off showing venous pressure increased to 400's, flushed lines with no change in pressure, lines switched and blood flow rate decreased to 270 due to access pressure in the -180's. John Schultz

## 2016-03-05 NOTE — Progress Notes (Signed)
Dr Leonidas Romberg ordered for patient to have a ultrafiltration of 32ml/hr from 0.  I notified Dr. Holley Raring of this order and he is aware and is ok with that rate change.

## 2016-03-05 NOTE — Progress Notes (Signed)
Performed sedation vacation- patient only able to move feet on command- patient became very restless- breathing around 30x/min.  ST around 107- Per Dr. Leonidas Romberg- patient placed back on sedation.  Sedation restarted at 50%.  Will continue to monitor.

## 2016-03-05 NOTE — Progress Notes (Signed)
PULMONARY / CRITICAL CARE MEDICINE   Name: John Schultz MRN: [id number] DOB: 10/24/1941    ADMISSION DATE:  02/25/2016  BRIEF HISTORY: 75 year old male past medical history of hypertension, chronic diastolic heart failure, CK, diabetes, presented on April 2 with four-day history of progressive right knee pain fever, shortness of breath, cough. Within 24 hours decompensated requiring intubation on 04/03.  Seen by orthopedics, had drain placed in right knee with purulent material. Now with mechanical ventilation and slowly been weaned off.   SUBJECTIVE:  No acute issues overnight. Remains on full vent support. CRRT initiated last night. Failed SBT 04/09 due to respiratory muscle fatique and agitation.  Positive Hemoccult 04/08; H/H stable at 8.2/23.1 post transfusion of 1 unit of PRBCs on 04/09.  SIGNIFICANT EVENTS/STUDIES: 04/02 admitted by Hospitalist Service with 4 day history of right knee pain - swollen, erythematous and tender on exam - and severe sepsis, AKI, abnormal EKG. Cardiac markers positive (peak troponin I 8.10) 04/03 Orthopedics consultation and aspiration of R Knee, with drain placement 04/03 ID consultation 04/03 Nephrology consultation 04/04 TTE: LVEF 60-65%, no vegetations 04/04 altered mental status, worsening respiratory status, septic, intubated 04/04 R knee hemovac removed by ortho 04/06 Cardiology consultation: NSTEMI - deemed secondary to demand ischemia. Further W/U to be considered after resolution of severe sepsis and critical illness 04/09 CRRT initiated  04/10 agitated on WUA. Failed SBT. Off and on norepienphrine  INDWELLING DEVICES:: R IJ CVL 04/03 >>  ETT 04/03 >>  R femoral HD cath 04/09 >>   MICRO DATA: 04/02 MRSA PCR >> NEG 04/02 flu screen >> NEG 04/02 blood >> Strep pyogenes 04/02 knee aspirate >> Strep pyogenes 04/05 blood >> NEG  ANTIMICROBIALS:  Ceftriaxone 04/02 X 1  Vanc 04/02 >> 04/03 Pip-tazo 04/02 >> 04/03 Clinda 04/03 >>  04/07 Pen G 04/03 >>    VITAL SIGNS: Temp:  [94.5 F (34.7 C)-98.6 F (37 C)] 97.7 F (36.5 C) (04/10 0400) Pulse Rate:  [62-93] 71 (04/10 0700) Resp:  [15-23] 18 (04/10 0700) BP: (96-134)/(44-63) 120/54 mmHg (04/10 0700) SpO2:  [95 %-100 %] 99 % (04/10 0700) FiO2 (%):  [30 %] 30 % (04/10 0500) Weight:  [227 lb 4.7 oz (103.1 kg)] 227 lb 4.7 oz (103.1 kg) (04/10 0430) HEMODYNAMICS: CVP:  [10 mmHg-21 mmHg] 10 mmHg VENTILATOR SETTINGS: Vent Mode:  [-] PRVC FiO2 (%):  [30 %] 30 % Set Rate:  [18 bmp] 18 bmp Vt Set:  [620 mL] 620 mL PEEP:  [5 cmH20] 5 cmH20 Plateau Pressure:  [23 cmH20-26 cmH20] 26 cmH20 INTAKE / OUTPUT:  Intake/Output Summary (Last 24 hours) at 03/05/16 0713 Last data filed at 03/05/16 0700  Gross per 24 hour  Intake 10064.07 ml  Output   1560 ml  Net 8504.07 ml    Review of Systems  Unable to perform ROS: intubated    Physical Exam  Constitutional:  RASS -3, intubated  HENT:  Head: Normocephalic and atraumatic.  Eyes: EOM are normal. Pupils are equal, round, and reactive to light.  Neck: No JVD present.  Cardiovascular: Normal rate, regular rhythm and normal heart sounds.   No murmur heard. Pulmonary/Chest: No respiratory distress. He has no wheezes. He has no rales.  Abdominal: Soft. Bowel sounds are normal. He exhibits no distension. There is no tenderness.  Musculoskeletal: He exhibits edema.  Symmetric ankle edema. R knee without edema or erythema  Neurological: He has normal reflexes.  Moves all extremities  Skin: Skin is warm and dry.  LABS:  CBC  Recent Labs Lab 03/03/16 0506 03/03/16 1049 03/04/16 0530 03/05/16 0557  WBC 11.8*  --  11.3* 7.9  HGB 7.4* 7.4* 7.4* 8.2*  HCT 21.8* 21.2* 21.3* 23.1*  PLT 139*  --  130* 110*   Coag's No results for input(s): APTT, INR in the last 168 hours. BMET  Recent Labs Lab 03/04/16 2210 03/05/16 0218 03/05/16 0557  NA 130* 132* 133*  K 3.7 4.0 4.0  CL 99* 102 102  CO2 24 25 26    BUN 65* 58* 50*  CREATININE 1.35* 1.20 1.01  GLUCOSE 185* 174* 148*   Electrolytes  Recent Labs Lab 03/04/16 2210 03/05/16 0218 03/05/16 0557 03/05/16 0558  CALCIUM 6.5* 6.6* 6.8*  --   MG 2.0 2.0  --  2.0  PHOS 3.1 2.8 2.8  --    Sepsis Markers  Recent Labs Lab 02/27/16 1200 02/29/16 0513 03/02/16 0740 03/02/16 0812  LATICACIDVEN 1.3  --   --  1.3  PROCALCITON  --  16.65 7.12  --    ABG  Recent Labs Lab 02/27/16 1646 03/02/16 0921 03/03/16 1053  PHART 7.34* 7.26* 7.47*  PCO2ART 35 34 31*  PO2ART 92 90 80*   Liver Enzymes  Recent Labs Lab 03/04/16 2210 03/05/16 0218 03/05/16 0557  ALBUMIN 1.3* 1.3* 1.3*   Cardiac Enzymes  Recent Labs Lab 02/27/16 0832  TROPONINI 8.10*   Glucose  Recent Labs Lab 03/05/16 0129 03/05/16 0230 03/05/16 0333 03/05/16 0435 03/05/16 0539 03/05/16 0638  GLUCAP 166* 170* 166* 159* 153* 153*    CXR 04/10: Persistent bilateral pleural effusions and pulmonary edema    ASSESSMENT / PLAN:  PULMONARY Acute vent dep respiratory failure Pulm edema pattern on CXR P: Cont vent support - settings reviewed and/or adjusted Cont vent bundle Daily SBT if/when meets criteria   CARDIOVASCULAR-NSTEMI Septic shock NSTEMI P:  MAP goal > 65 mmHg Norepinephrine as needed Further cardiac w/u after critical illness resolves  RENAL AKI Hypervolemia P: Monitor BMET intermittently Monitor I/Os Correct electrolytes as indicated Renal Service following Cont CRRT. UF -50 cc/hr  GASTROINTESTINAL Diarrhea  Hemoccult positive P: SUP: enteral famotidine Continue TFs  HEMATOLOGIC Anemia - no overt bleeding presently P: DVT px: SQ heparin Monitor CBC intermittently Transfuse per usual guidelines  INFECTIOUS Septic arthritis of the R knee P: Monitor temp, WBC count Micro and abx as above  ENDOCRINE DM 2 Severe hyperglycemia on insulin gtt Presumed relative adrenal insufficiency P: Decrease hydrocortisone  to 50 mg q 12 hrs Begin Lantus 04/10 Transition from insulin gtt to Dora 04/10  NEUROLOGIC ICU acquired delirium P: RASS goal -1 Continue propofol Continue fentanyl gtt Daily WUA   Disposition and family update: Wife updated @ bedside   CCM time: 60 mins The above time includes time spent in consultation with patient and/or family members and reviewing care plan on multidisciplinary rounds  Merton Border, MD PCCM service Mobile [phone] Pager [phone]

## 2016-03-05 NOTE — Progress Notes (Signed)
[place] for Medication dose adjustment/monitoring Indication: CRRT  Allergies  Allergen Reactions  . Tape Rash    Patient Measurements: Height: 6' (182.9 cm) Weight: 227 lb 4.7 oz (103.1 kg) IBW/kg (Calculated) : 77.6   Labs:  Recent Labs  03/03/16 0506 03/03/16 1049  03/04/16 0530  03/04/16 2210 03/05/16 0218 03/05/16 0557 03/05/16 0558  WBC 11.8*  --   --  11.3*  --   --   --  7.9  --   HGB 7.4* 7.4*  --  7.4*  --   --   --  8.2*  --   HCT 21.8* 21.2*  --  21.3*  --   --   --  23.1*  --   PLT 139*  --   --  130*  --   --   --  110*  --   CREATININE 2.10* 2.03*  < >  --   < > 1.35* 1.20 1.01  --   MG 2.2  --   < >  --   < > 2.0 2.0  --  2.0  PHOS 4.5  --   < >  --   < > 3.1 2.8 2.8  --   ALBUMIN  --   --   < >  --   < > 1.3* 1.3* 1.3*  --   < > = values in this interval not displayed. Estimated Creatinine Clearance: 80.9 mL/min (by C-G formula based on Cr of 1.01).   Assessment: Pharmacy consulted to monitor and adjust medication doses per CRRT.   Plan:  No further adjustments warranted at this time.   Pharmacy will continue to monitor and adjust per consult.   Zoi Devine L 03/05/2016,11:06 AM

## 2016-03-05 NOTE — Progress Notes (Signed)
Nutrition Follow-up    INTERVENTION:   EN: with current diprivan, recommend continuing current TF regimen; continue to assess   NUTRITION DIAGNOSIS:   Inadequate oral intake related to acute illness as evidenced by NPO status. Being addressed via TF  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, respiratory distress with severe acidosis requiring intubation on 02/27/16   Pt on CRRT, UF of 50 ml/hr, UOP 560 mL in 24 hours  Patient is currently intubated on ventilator support MV: 13.6 L/min Temp (24hrs), Avg:97.1 F (36.2 C), Min:94.5 F (34.7 C), Max:98.1 F (36.7 C)  Propofol: 10.2 ml/hr (269 kcals in past 24 hours)  Diet Order:    NPO  EN: tolerating Vital 1.2 at rate of 55 ml/hr, Prostat BID   Skin:  Reviewed, no issues  Last BM:  03/02/16   Labs: sodium 133  Meds: insulin drip, fentanyl, diprivan  Height:   Ht Readings from Last 1 Encounters:  02/28/16 6' (1.829 m)    Weight:   Wt Readings from Last 1 Encounters:  03/05/16 227 lb 4.7 oz (103.1 kg)    Filed Weights   03/03/16 0500 03/04/16 0500 03/05/16 0430  Weight: 223 lb 12.3 oz (101.5 kg) 225 lb 15.5 oz (102.5 kg) 227 lb 4.7 oz (103.1 kg)    BMI:  Body mass index is 30.82 kg/(m^2).  Estimated Nutritional Needs:   Kcal:  2109 kcals (Ve: 13.6, Tmax: 36.9) using wt of 103 kg  Protein:  128-170 g (1.5-2.0 g/kg)   Fluid:  >2 Liters  EDUCATION NEEDS:   Education needs no appropriate at this time  [place], [place], [place] [phone] Pager  [phone] Weekend/On-Call Pager

## 2016-03-05 NOTE — Progress Notes (Signed)
[facility]  Daily Progress Note   Subjective  - * No surgery found *  Remains critically ill, intubated and sedated on the ventilator. Cannot provide any history. Tolerating CRRT.  Objective Filed Vitals:   03/05/16 1200 03/05/16 1300 03/05/16 1329 03/05/16 1400  BP: 97/55 92/51  96/49  Pulse: 97 91  92  Temp:      TempSrc:      Resp: 16 18  0  Height:      Weight:      SpO2: 99% 99% 100% 99%    Intake/Output Summary (Last 24 hours) at 03/05/16 1613 Last data filed at 03/05/16 1500  Gross per 24 hour  Intake 3020.7 ml  Output   1111 ml  Net 1909.7 ml    PULM  Course breath sounds bilaterally CV  RRR VASC  Catheter and right groin clean, dry, and intact without bleeding.  Laboratory CBC    Component Value Date/Time   WBC 7.9 03/05/2016 0557   WBC 5.8 03/21/2015 0523   HGB 8.2* 03/05/2016 0557   HGB 10.9* 03/21/2015 0523   HCT 23.1* 03/05/2016 0557   HCT 33.5* 03/21/2015 0523   PLT 110* 03/05/2016 0557   PLT 126* 03/21/2015 0523    BMET    Component Value Date/Time   NA 134* 03/05/2016 1408   NA 141 03/22/2015 0435   K 4.3 03/05/2016 1408   K 4.0 03/22/2015 0435   CL 103 03/05/2016 1408   CL 92* 03/22/2015 0435   CO2 27 03/05/2016 1408   CO2 40* 03/22/2015 0435   GLUCOSE 180* 03/05/2016 1408   GLUCOSE 213* 03/22/2015 0435   BUN 46* 03/05/2016 1408   BUN 46* 03/22/2015 0435   CREATININE 0.90 03/05/2016 1408   CREATININE 1.44* 03/22/2015 0435   CALCIUM 7.4* 03/05/2016 1408   CALCIUM 9.0 03/22/2015 0435   GFRNONAA >60 03/05/2016 1408   GFRNONAA 48* 03/22/2015 0435   GFRAA >60 03/05/2016 1408   GFRAA 56* 03/22/2015 0435    Assessment/Planning: POD #1 s/p dialysis catheter placement for CRRT secondary to acute renal failure.   Tolerating CRRT reasonably well with reduction of BUN and creatinine.  Multisystem organ failure with high risk of mortality and major morbidity. Internal medicine and critical care service managing  many of the issues.  Prognosis is poor, but if patient survives and his renal function does not return we will be happy to place a tunneled dialysis catheter prior to discharge.  Otherwise, we will sign off as no active vascular problems are present. Please call with questions.  Riane Rung  03/05/2016, 4:13 PM

## 2016-03-06 ENCOUNTER — Inpatient Hospital Stay: Payer: Commercial Managed Care - HMO

## 2016-03-06 DIAGNOSIS — E8779 Other fluid overload: Secondary | ICD-10-CM

## 2016-03-06 LAB — COMPREHENSIVE METABOLIC PANEL
ALBUMIN: 1.4 g/dL — AB (ref 3.5–5.0)
ALT: 28 U/L (ref 17–63)
AST: 48 U/L — AB (ref 15–41)
Alkaline Phosphatase: 77 U/L (ref 38–126)
Anion gap: 3 — ABNORMAL LOW (ref 5–15)
BUN: 35 mg/dL — AB (ref 6–20)
CHLORIDE: 103 mmol/L (ref 101–111)
CO2: 27 mmol/L (ref 22–32)
Calcium: 7.4 mg/dL — ABNORMAL LOW (ref 8.9–10.3)
Creatinine, Ser: 0.76 mg/dL (ref 0.61–1.24)
GFR calc Af Amer: >60 mL/min (ref >60)
Glucose, Bld: 268 mg/dL — ABNORMAL HIGH (ref 65–99)
POTASSIUM: 4 mmol/L (ref 3.5–5.1)
Sodium: 133 mmol/L — ABNORMAL LOW (ref 135–145)
Total Bilirubin: 0.6 mg/dL (ref 0.3–1.2)
Total Protein: 5.6 g/dL — ABNORMAL LOW (ref 6.5–8.1)

## 2016-03-06 LAB — CBC
HCT: 25.5 % — ABNORMAL LOW (ref 40.0–52.0)
Hemoglobin: 8.5 g/dL — ABNORMAL LOW (ref 13.0–18.0)
MCH: 29.8 pg (ref 26.0–34.0)
MCHC: 33.2 g/dL (ref 32.0–36.0)
MCV: 89.7 fL (ref 80.0–100.0)
PLATELETS: 81 10*3/uL — AB (ref 150–440)
RBC: 2.85 MIL/uL — ABNORMAL LOW (ref 4.40–5.90)
RDW: 14.4 % (ref 11.5–14.5)
WBC: 11.6 10*3/uL — AB (ref 3.8–10.6)

## 2016-03-06 LAB — RENAL FUNCTION PANEL
ALBUMIN: 1.3 g/dL — AB (ref 3.5–5.0)
ALBUMIN: 1.3 g/dL — AB (ref 3.5–5.0)
ANION GAP: 1 — AB (ref 5–15)
ANION GAP: 3 — AB (ref 5–15)
ANION GAP: 4 — AB (ref 5–15)
ANION GAP: 3 — AB (ref 5–15)
Albumin: 1.3 g/dL — ABNORMAL LOW (ref 3.5–5.0)
Albumin: 1.3 g/dL — ABNORMAL LOW (ref 3.5–5.0)
Albumin: 1.3 g/dL — ABNORMAL LOW (ref 3.5–5.0)
Albumin: 1.3 g/dL — ABNORMAL LOW (ref 3.5–5.0)
Anion gap: 4 — ABNORMAL LOW (ref 5–15)
Anion gap: 4 — ABNORMAL LOW (ref 5–15)
BUN: 39 mg/dL — ABNORMAL HIGH (ref 6–20)
BUN: 35 mg/dL — ABNORMAL HIGH (ref 6–20)
BUN: 35 mg/dL — AB (ref 6–20)
BUN: 34 mg/dL — AB (ref 6–20)
BUN: 31 mg/dL — ABNORMAL HIGH (ref 6–20)
BUN: 29 mg/dL — AB (ref 6–20)
CALCIUM: 7.5 mg/dL — AB (ref 8.9–10.3)
CALCIUM: 7.5 mg/dL — AB (ref 8.9–10.3)
CALCIUM: 7.6 mg/dL — AB (ref 8.9–10.3)
CALCIUM: 7.8 mg/dL — AB (ref 8.9–10.3)
CALCIUM: 7.8 mg/dL — AB (ref 8.9–10.3)
CHLORIDE: 105 mmol/L (ref 101–111)
CHLORIDE: 105 mmol/L (ref 101–111)
CHLORIDE: 106 mmol/L (ref 101–111)
CO2: 28 mmol/L (ref 22–32)
CO2: 26 mmol/L (ref 22–32)
CO2: 26 mmol/L (ref 22–32)
CO2: 27 mmol/L (ref 22–32)
CO2: 27 mmol/L (ref 22–32)
CO2: 27 mmol/L (ref 22–32)
CREATININE: 0.81 mg/dL (ref 0.61–1.24)
Calcium: 7.8 mg/dL — ABNORMAL LOW (ref 8.9–10.3)
Chloride: 104 mmol/L (ref 101–111)
Chloride: 105 mmol/L (ref 101–111)
Chloride: 106 mmol/L (ref 101–111)
Creatinine, Ser: 0.84 mg/dL (ref 0.61–1.24)
Creatinine, Ser: 0.9 mg/dL (ref 0.61–1.24)
Creatinine, Ser: 0.74 mg/dL (ref 0.61–1.24)
Creatinine, Ser: 0.7 mg/dL (ref 0.61–1.24)
Creatinine, Ser: 0.62 mg/dL (ref 0.61–1.24)
GFR calc Af Amer: >60 mL/min (ref >60)
GFR calc Af Amer: >60 mL/min (ref >60)
GFR calc Af Amer: >60 mL/min (ref >60)
GFR calc non Af Amer: >60 mL/min (ref >60)
GFR calc non Af Amer: >60 mL/min (ref >60)
GFR calc non Af Amer: >60 mL/min (ref >60)
GFR calc non Af Amer: >60 mL/min (ref >60)
GLUCOSE: 259 mg/dL — AB (ref 65–99)
GLUCOSE: 198 mg/dL — AB (ref 65–99)
GLUCOSE: 188 mg/dL — AB (ref 65–99)
Glucose, Bld: 261 mg/dL — ABNORMAL HIGH (ref 65–99)
Glucose, Bld: 181 mg/dL — ABNORMAL HIGH (ref 65–99)
Glucose, Bld: 185 mg/dL — ABNORMAL HIGH (ref 65–99)
PHOSPHORUS: 2.3 mg/dL — AB (ref 2.5–4.6)
PHOSPHORUS: 2.2 mg/dL — AB (ref 2.5–4.6)
PHOSPHORUS: 2.1 mg/dL — AB (ref 2.5–4.6)
PHOSPHORUS: 2.1 mg/dL — AB (ref 2.5–4.6)
POTASSIUM: 4 mmol/L (ref 3.5–5.1)
POTASSIUM: 4.1 mmol/L (ref 3.5–5.1)
POTASSIUM: 4 mmol/L (ref 3.5–5.1)
Phosphorus: 2.3 mg/dL — ABNORMAL LOW (ref 2.5–4.6)
Phosphorus: 2.2 mg/dL — ABNORMAL LOW (ref 2.5–4.6)
Potassium: 4 mmol/L (ref 3.5–5.1)
Potassium: 3.8 mmol/L (ref 3.5–5.1)
Potassium: 4.1 mmol/L (ref 3.5–5.1)
SODIUM: 133 mmol/L — AB (ref 135–145)
SODIUM: 135 mmol/L (ref 135–145)
SODIUM: 136 mmol/L (ref 135–145)
Sodium: 134 mmol/L — ABNORMAL LOW (ref 135–145)
Sodium: 136 mmol/L (ref 135–145)
Sodium: 137 mmol/L (ref 135–145)

## 2016-03-06 LAB — TYPE AND SCREEN
ABO/RH(D): A NEG
ANTIBODY SCREEN: NEGATIVE
Unit division: 0

## 2016-03-06 LAB — GLUCOSE, CAPILLARY
GLUCOSE-CAPILLARY: 223 mg/dL — AB (ref 65–99)
GLUCOSE-CAPILLARY: 158 mg/dL — AB (ref 65–99)
GLUCOSE-CAPILLARY: 176 mg/dL — AB (ref 65–99)
GLUCOSE-CAPILLARY: 150 mg/dL — AB (ref 65–99)
Glucose-Capillary: 195 mg/dL — ABNORMAL HIGH (ref 65–99)
Glucose-Capillary: 241 mg/dL — ABNORMAL HIGH (ref 65–99)

## 2016-03-06 LAB — MAGNESIUM
MAGNESIUM: 2 mg/dL (ref 1.7–2.4)
MAGNESIUM: 1.8 mg/dL (ref 1.7–2.4)
MAGNESIUM: 1.9 mg/dL (ref 1.7–2.4)
Magnesium: 2 mg/dL (ref 1.7–2.4)
Magnesium: 1.8 mg/dL (ref 1.7–2.4)

## 2016-03-06 LAB — C DIFFICILE QUICK SCREEN W PCR REFLEX
C Diff antigen: NEGATIVE
C Diff interpretation: NEGATIVE
C Diff toxin: NEGATIVE

## 2016-03-06 MED ORDER — PRO-STAT SUGAR FREE PO LIQD
30.0000 mL | Freq: Three times a day (TID) | ORAL | Status: DC
  Administered 2016-03-06 – 2016-03-07 (×5): 30 mL

## 2016-03-06 MED ORDER — FENTANYL CITRATE (PF) 100 MCG/2ML IJ SOLN
25.0000 ug | INTRAMUSCULAR | Status: DC | PRN
  Administered 2016-03-06: 50 ug via INTRAVENOUS
  Administered 2016-03-07 (×2): 25 ug via INTRAVENOUS
  Administered 2016-03-07 (×2): 50 ug via INTRAVENOUS
  Filled 2016-03-06 (×5): qty 2

## 2016-03-06 MED ORDER — PRAVASTATIN SODIUM 20 MG PO TABS
40.0000 mg | ORAL_TABLET | Freq: Every day | ORAL | Status: DC
  Administered 2016-03-06 – 2016-03-07 (×2): 40 mg
  Filled 2016-03-06 (×2): qty 2

## 2016-03-06 MED ORDER — HYDROCORTISONE NA SUCCINATE PF 100 MG IJ SOLR
25.0000 mg | Freq: Two times a day (BID) | INTRAMUSCULAR | Status: DC
  Administered 2016-03-06 (×2): 25 mg via INTRAVENOUS
  Filled 2016-03-06: qty 2

## 2016-03-06 NOTE — Progress Notes (Signed)
PULMONARY / CRITICAL CARE MEDICINE   Name: John Schultz MRN: [id number] DOB: 05-26-1941    ADMISSION DATE:  03/06/2016  BRIEF HISTORY: 76 year old male past medical history of hypertension, chronic diastolic heart failure, CK, diabetes, presented on April 2 with four-day history of progressive right knee pain fever, shortness of breath, cough. Within 24 hours decompensated requiring intubation on 04/03.  Seen by orthopedics, had drain placed in right knee with purulent material. Now with mechanical ventilation and slowly been weaned off.   SUBJECTIVE:  No acute issues overnight. Remains on full vent support. CRRT initiated last night. Failed SBT 04/09 due to respiratory muscle fatique and agitation.  Positive Hemoccult 04/08; H/H stable at 8.2/23.1 post transfusion of 1 unit of PRBCs on 04/09.  SIGNIFICANT EVENTS/STUDIES: 04/02 admitted by Hospitalist Service with 4 day history of right knee pain - swollen, erythematous and tender on exam - and severe sepsis, AKI, abnormal EKG. Cardiac markers positive (peak troponin I 8.10) 04/03 Orthopedics consultation and aspiration of R Knee, with drain placement 04/03 ID consultation 04/03 Nephrology consultation 04/04 TTE: LVEF 60-65%, no vegetations 04/04 altered mental status, worsening respiratory status, septic, intubated 04/04 R knee hemovac removed by ortho 04/06 Cardiology consultation: NSTEMI - deemed secondary to demand ischemia. Further W/U to be considered after resolution of severe sepsis and critical illness 04/09 CRRT initiated  04/10 agitated on WUA. Failed SBT. Off and on norepienphrine 04/11 Tolerating volume removal by CRRT. Failed SBT. Tolerated PS 10-14 cm H2O  INDWELLING DEVICES:: R IJ CVL 04/03 >>  ETT 04/03 >>  R femoral HD cath 04/09 >>   MICRO DATA: 04/02 MRSA PCR >> NEG 04/02 flu screen >> NEG 04/02 blood >> Strep pyogenes 04/02 knee aspirate >> Strep pyogenes 04/05 blood >> NEG  ANTIMICROBIALS:  Ceftriaxone  04/02 X 1  Vanc 04/02 >> 04/03 Pip-tazo 04/02 >> 04/03 Clinda 04/03 >> 04/07 Pen G 04/03 >>    VITAL SIGNS: Temp:  [97.8 F (36.6 C)-98.2 F (36.8 C)] 97.8 F (36.6 C) (04/11 0700) Pulse Rate:  [67-115] 98 (04/11 0930) Resp:  [0-23] 22 (04/11 1000) BP: (88-128)/(49-110) 112/64 mmHg (04/11 1000) SpO2:  [95 %-100 %] 97 % (04/11 0930) FiO2 (%):  [30 %] 30 % (04/11 1152) Weight:  [106 kg (233 lb 11 oz)] 106 kg (233 lb 11 oz) (04/11 0400) HEMODYNAMICS: CVP:  [12 mmHg-20 mmHg] 12 mmHg VENTILATOR SETTINGS: Vent Mode:  [-] PSV;CPAP FiO2 (%):  [30 %] 30 % Set Rate:  [15 bmp] 15 bmp Vt Set:  [550 mL] 550 mL PEEP:  [5 cmH20] 5 cmH20 Pressure Support:  [10 cmH20-19 cmH20] 19 cmH20 INTAKE / OUTPUT:  Intake/Output Summary (Last 24 hours) at 03/06/16 1359 Last data filed at 03/06/16 1000  Gross per 24 hour  Intake 4476.16 ml  Output   1623 ml  Net 2853.16 ml    Review of Systems  Unable to perform ROS: intubated    Physical Exam  Constitutional:  RASS -3, intubated  HENT:  Head: Normocephalic and atraumatic.  Eyes: EOM are normal. Pupils are equal, round, and reactive to light.  Neck: No JVD present.  Cardiovascular: Normal rate, regular rhythm and normal heart sounds.   No murmur heard. Pulmonary/Chest: No respiratory distress. He has no wheezes. He has no rales.  Abdominal: Soft. Bowel sounds are normal. He exhibits no distension. There is no tenderness.  Musculoskeletal: He exhibits edema.  Symmetric ankle edema. R knee without edema or erythema  Neurological: He has normal reflexes.  Moves all extremities  Skin: Skin is warm and dry.     LABS:  CBC  Recent Labs Lab 03/04/16 0530 03/05/16 0557 03/06/16 0513  WBC 11.3* 7.9 11.6*  HGB 7.4* 8.2* 8.5*  HCT 21.3* 23.1* 25.5*  PLT 130* 110* 81*   Coag's No results for input(s): APTT, INR in the last 168 hours. BMET  Recent Labs Lab 03/06/16 0211 03/06/16 0513 03/06/16 0925  NA 134* 133*  133* 135  K  4.0 4.0  4.0 3.8  CL 105 104  103 105  CO2 28 26  27 26   BUN 39* 35*  35* 35*  CREATININE 0.84 0.90  0.76 0.81  GLUCOSE 259* 261*  268* 198*   Electrolytes  Recent Labs Lab 03/06/16 0211 03/06/16 0513 03/06/16 0925  CALCIUM 7.5* 7.5*  7.4* 7.6*  MG 2.0 1.8 2.0  PHOS 2.3* 2.2* 2.1*   Sepsis Markers  Recent Labs Lab 02/29/16 0513 03/02/16 0740 03/02/16 0812  LATICACIDVEN  --   --  1.3  PROCALCITON 16.65 7.12  --    ABG  Recent Labs Lab 03/02/16 0921 03/03/16 1053  PHART 7.26* 7.47*  PCO2ART 34 31*  PO2ART 90 80*   Liver Enzymes  Recent Labs Lab 03/06/16 0211 03/06/16 0513 03/06/16 0925  AST  --  48*  --   ALT  --  28  --   ALKPHOS  --  77  --   BILITOT  --  0.6  --   ALBUMIN 1.3* 1.3*  1.4* 1.3*   Cardiac Enzymes No results for input(s): TROPONINI, PROBNP in the last 168 hours. Glucose  Recent Labs Lab 03/05/16 1641 03/05/16 1923 03/05/16 2347 03/06/16 0337 03/06/16 0737 03/06/16 1129  GLUCAP 177* 218* 214* 241* 223* 158*    CXR: [place]    ASSESSMENT / PLAN:  PULMONARY Acute vent dep respiratory failure Pulm edema pattern on CXR P: Cont vent support - settings reviewed and/or adjusted Cont vent bundle Daily SBT if/when meets criteria Continue volume removal as permitted by BP  CARDIOVASCULAR-NSTEMI Septic shock NSTEMI P:  MAP goal > 65 mmHg Norepinephrine as needed Further cardiac w/u after critical illness resolves  RENAL AKI Hypervolemia P: Monitor BMET intermittently Monitor I/Os Correct electrolytes as indicated Renal Service following Cont CRRT. UF -100 cc/hr  GASTROINTESTINAL Diarrhea  Hemoccult positive P: SUP: enteral famotidine Continue TFs  HEMATOLOGIC Anemia - no overt bleeding presently P: DVT px: SQ heparin Monitor CBC intermittently Transfuse per usual guidelines  INFECTIOUS Septic arthritis of the R knee P: Monitor temp, WBC count Micro and abx as above  ENDOCRINE DM 2 Severe  hyperglycemia on insulin gtt Presumed relative adrenal insufficiency P: Decrease hydrocortisone to 25 mg q 12 hrs Cont Lantus - initiated 04/10 Cont SSI   NEUROLOGIC ICU acquired delirium P: RASS goal 0, -1 Continue propofol [place] fentanyl to intermittent boluses Daily WUA   Disposition and family update: Wife updated @ bedside   CCM time: 35 mins The above time includes time spent in consultation with patient and/or family members and reviewing care plan on multidisciplinary rounds  Merton Border, MD PCCM service Mobile [phone] Pager [phone]

## 2016-03-06 NOTE — Progress Notes (Signed)
This note also relates to the following rows which could not be included: SpO2 - Cannot attach notes to unvalidated device data     03/06/16 1832  Vent Select  Invasive or Noninvasive Invasive  Adult Vent Y  Airway 8 mm  Placement Date: 02/27/16   Airway Device: Endotracheal Tube  Size (mm): 8 mm  Cuffed: Cuffed  Secured at (cm): 24 cm  Secured at (cm) 24 cm  Measured From Lips  Secured Location Left  Secured By Charity fundraiser  Cuff Pressure (cm H2O) 22 cm H2O  Adult Ventilator Settings  Vent Type Servo i  Humidity HME  Vent Mode PRVC  Vt Set 550 mL  Set Rate 15 bmp  FiO2 (%) 30 %  I Time 1 Sec(s)  PEEP 5 cmH20  Adult Ventilator Measurements  Peak Airway Pressure 20 L/min  Mean Airway Pressure 11 cmH20  Resp Rate Spontaneous 9 br/min  Resp Rate Total 24 br/min  Spont TV 554 mL  I:E Ratio Measured 13.3  End Tidal CO2 30  Adult Ventilator Alarms  Alarms On Y  Ve High Alarm 24 L/min  Ve Low Alarm 4 L/min  Resp Rate High Alarm 38 br/min  Resp Rate Low Alarm 8  PEEP Low Alarm 2 cmH2O  Press High Alarm 40 cmH2O  Breath Sounds  Bilateral Breath Sounds Rhonchi  Suction Method  Respiratory Interventions Airway suction  Airway Suctioning/Secretions  Suction Type ETT  Suction Device  Catheter  Secretion Amount Copious  Secretion Color White  Secretion Consistency Thick  Suction Tolerance Tolerated well  Suctioning Adverse Effects None  Placed patient back on original vent settings due to tachypnea and increased WOB.  Patient is tolerating well at this time.

## 2016-03-06 NOTE — Progress Notes (Signed)
Subjective:  Patient continues on continuous renal placement therapy. Next line he remains critically ill. He remains on the ventilator.    Objective:  Vital signs in last 24 hours:  Temp:  [97.8 F (36.6 C)-98.2 F (36.8 C)] 97.8 F (36.6 C) (04/11 0700) Pulse Rate:  [67-107] 84 (04/11 0600) Resp:  [0-25] 22 (04/11 0600) BP: (86-128)/(47-81) 116/56 mmHg (04/11 0600) SpO2:  [95 %-100 %] 100 % (04/11 0600) FiO2 (%):  [30 %] 30 % (04/11 0322) Weight:  [106 kg (233 lb 11 oz)] 106 kg (233 lb 11 oz) (04/11 0400)  Weight change: 2.9 kg (6 lb 6.3 oz) Filed Weights   03/04/16 0500 03/05/16 0430 03/06/16 0400  Weight: 102.5 kg (225 lb 15.5 oz) 103.1 kg (227 lb 4.7 oz) 106 kg (233 lb 11 oz)    Intake/Output:    Intake/Output Summary (Last 24 hours) at 03/06/16 0811 Last data filed at 03/06/16 0600  Gross per 24 hour  Intake 4812.62 ml  Output   1590 ml  Net 3222.62 ml     Physical Exam: General: Critically ill appearing, lethargic  HEENT ETT  Neck supple  Pulm/lungs Vent assisted bilateral rhonchi  CVS/Heart Regular, soft systolic murmur  Abdomen:  Soft, non distended, BS present  Extremities: No peripheral edema, rt knee drain    Neurologic: Sedated,  Not following commands  Skin: Heel skin breakdown  GU: Foley       Basic Metabolic Panel:   Recent Labs Lab 03/05/16 1408 03/05/16 1822 03/05/16 2203 03/06/16 0211 03/06/16 0513  NA 134* 134* 135 134* 133*  133*  K 4.3 4.1 3.9 4.0 4.0  4.0  CL 103 103 105 105 104  103  CO2 27 25 28 28 26  27   GLUCOSE 180* 257* 229* 259* 261*  268*  BUN 46* 42* 42* 39* 35*  35*  CREATININE 0.90 0.92 0.86 0.84 0.90  0.76  CALCIUM 7.4* 7.3* 7.5* 7.5* 7.5*  7.4*  MG 2.0 1.9 2.0 2.0 1.8  PHOS 2.5 2.4* 2.3* 2.3* 2.2*     CBC:  Recent Labs Lab 03/02/16 0740 03/03/16 0506 03/03/16 1049 03/04/16 0530 03/05/16 0557 03/06/16 0513  WBC 12.8* 11.8*  --  11.3* 7.9 11.6*  HGB 8.2* 7.4* 7.4* 7.4* 8.2* 8.5*  HCT  24.6* 21.8* 21.2* 21.3* 23.1* 25.5*  MCV 90.9 87.9  --  87.4 87.9 89.7  PLT 148* 139*  --  130* 110* 81*      Microbiology:  Recent Results (from the past 720 hour(s))  Blood culture (routine x 2)     Status: Abnormal   Collection Time: 02/25/2016 12:59 PM  Result Value Ref Range Status   Specimen Description BLOOD LEFT FOREARM  Final   Special Requests BOTTLES DRAWN AEROBIC AND ANAEROBIC  1CC  Final   Culture  Setup Time   Final    GRAM POSITIVE COCCI AEROBIC BOTTLE ONLY CRITICAL VALUE NOTED.  VALUE IS CONSISTENT WITH PREVIOUSLY REPORTED AND CALLED VALUE.    Culture STREPTOCOCCUS PYOGENES AEROBIC BOTTLE ONLY  (A)  Final   Report Status 03/03/2016 FINAL  Final   Organism ID, Bacteria STREPTOCOCCUS PYOGENES  Final      Susceptibility   Streptococcus pyogenes - MIC*    CLINDAMYCIN Value in next row Sensitive      SENSITIVE0.25    AMPICILLIN Value in next row Sensitive      SENSITIVE0.25    ERYTHROMYCIN Value in next row Sensitive      SENSITIVE0.12  VANCOMYCIN Value in next row Sensitive      SENSITIVE0.5    CEFTRIAXONE Value in next row Sensitive      SENSITIVE0.12    LEVOFLOXACIN Value in next row Sensitive      SENSITIVE0.5    * STREPTOCOCCUS PYOGENES  Rapid Influenza A&B Antigens ([facility] only)     Status: None   Collection Time: 02/25/2016  1:00 PM  Result Value Ref Range Status   Influenza A ([place]) NEGATIVE NEGATIVE Final   Influenza B ([facility]) NEGATIVE NEGATIVE Final  Blood culture (routine x 2)     Status: None   Collection Time: 03/11/2016  1:40 PM  Result Value Ref Range Status   Specimen Description BLOOD LEFT FOREARM  Final   Special Requests BOTTLES DRAWN AEROBIC AND ANAEROBIC  4CC  Final   Culture  Setup Time   Final    GRAM POSITIVE COCCI IN BOTH AEROBIC AND ANAEROBIC BOTTLES CRITICAL RESULT CALLED TO, READ BACK BY AND VERIFIED WITH: [place] AT 0947 02/27/16 CAF    Culture   Final    STREPTOCOCCUS PYOGENES IN BOTH AEROBIC AND ANAEROBIC BOTTLES     Report Status 02/29/2016 FINAL  Final   Organism ID, Bacteria STREPTOCOCCUS PYOGENES  Final      Susceptibility   Streptococcus pyogenes - MIC*    CLINDAMYCIN Value in next row Sensitive      SENSITIVE<=0.25    AMPICILLIN Value in next row Sensitive      SENSITIVE<=0.25    ERYTHROMYCIN Value in next row Sensitive      SENSITIVE0.12    VANCOMYCIN Value in next row Sensitive      SENSITIVE0.5    CEFTRIAXONE Value in next row Sensitive      SENSITIVE0.12    LEVOFLOXACIN Value in next row Sensitive      SENSITIVE0.5    * STREPTOCOCCUS PYOGENES  Blood Culture ID Panel (Reflexed)     Status: Abnormal   Collection Time: 03/03/2016  1:40 PM  Result Value Ref Range Status   Enterococcus species NOT DETECTED NOT DETECTED Final   Vancomycin resistance NOT DETECTED NOT DETECTED Final   Listeria monocytogenes NOT DETECTED NOT DETECTED Final   Staphylococcus species NOT DETECTED NOT DETECTED Final   Staphylococcus aureus NOT DETECTED NOT DETECTED Final   Methicillin resistance NOT DETECTED NOT DETECTED Final   Streptococcus species NOT DETECTED NOT DETECTED Final   Streptococcus agalactiae NOT DETECTED NOT DETECTED Final   Streptococcus pneumoniae NOT DETECTED NOT DETECTED Final   Streptococcus pyogenes DETECTED (A) NOT DETECTED Final    Comment: CRITICAL RESULT CALLED TO, READ BACK BY AND VERIFIED WITH: NATE COOKSON AT 0426 02/27/16 CAF    Acinetobacter baumannii NOT DETECTED NOT DETECTED Final   Enterobacteriaceae species NOT DETECTED NOT DETECTED Final   Enterobacter cloacae complex NOT DETECTED NOT DETECTED Final   Escherichia coli NOT DETECTED NOT DETECTED Final   Klebsiella oxytoca NOT DETECTED NOT DETECTED Final   Klebsiella pneumoniae NOT DETECTED NOT DETECTED Final   Proteus species NOT DETECTED NOT DETECTED Final   Serratia marcescens NOT DETECTED NOT DETECTED Final   Carbapenem resistance NOT DETECTED NOT DETECTED Final   Haemophilus influenzae NOT DETECTED NOT DETECTED Final    Neisseria meningitidis NOT DETECTED NOT DETECTED Final   Pseudomonas aeruginosa NOT DETECTED NOT DETECTED Final   Candida albicans NOT DETECTED NOT DETECTED Final   Candida glabrata NOT DETECTED NOT DETECTED Final   Candida krusei NOT DETECTED NOT DETECTED Final   Candida parapsilosis NOT  DETECTED NOT DETECTED Final   Candida tropicalis NOT DETECTED NOT DETECTED Final  Body fluid culture     Status: None   Collection Time: 03/10/2016  3:05 PM  Result Value Ref Range Status   Specimen Description R Knee  Final   Special Requests NONE  Final   Gram Stain   Final    MODERATE WBC SEEN MODERATE GRAM POSITIVE COCCI IN PAIRS AND CHAINS    Culture   Final    HEAVY GROWTH GROUP A STREP (S.PYOGENES) ISOLATED There is no known Penicillin Resistant Beta Streptococcus in the [place] For patients that are Penicillin-allergic, Erythromycin is 85-94% susceptible, and Clindamycin is 80% susceptible.  Contact Microbiology within 7 days if sensitivity testing is  required.   NO ANAEROBES ISOLATED    Report Status 02/29/2016 FINAL  Final  Body fluid culture     Status: None   Collection Time: 03/18/2016  4:10 PM  Result Value Ref Range Status   Specimen Description R Knee  Final   Special Requests NONE  Final   Gram Stain   Final    MANY WBC SEEN MANY GRAM POSITIVE COCCI IN PAIRS AND CHAINS    Culture   Final    HEAVY GROWTH GROUP A STREP (S.PYOGENES) ISOLATED There is no known Penicillin Resistant Beta Streptococcus in the [place] For patients that are Penicillin-allergic, Erythromycin is 85-94% susceptible, and Clindamycin is 80% susceptible.  Contact Microbiology within 7 days if sensitivity testing is  required.   NO ANAEROBES ISOLATED    Report Status 02/29/2016 FINAL  Final  MRSA PCR Screening     Status: None   Collection Time: 02/27/16  3:35 AM  Result Value Ref Range Status   MRSA by PCR NEGATIVE NEGATIVE Final    Comment:        The GeneXpert MRSA Assay (FDA approved for NASAL  specimens only), is one component of a comprehensive MRSA colonization surveillance program. It is not intended to diagnose MRSA infection nor to guide or monitor treatment for MRSA infections.   Urine culture     Status: None   Collection Time: 02/27/16  8:30 AM  Result Value Ref Range Status   Specimen Description URINE, RANDOM  Final   Special Requests NONE  Final   Culture NO GROWTH 1 DAY  Final   Report Status 02/28/2016 FINAL  Final  Culture, blood (single) w Reflex to PCR ID Panel     Status: None   Collection Time: 02/29/16  2:36 PM  Result Value Ref Range Status   Specimen Description BLOOD LEFT HAND  Final   Special Requests BOTTLES DRAWN AEROBIC AND ANAEROBIC [place]  Final   Culture NO GROWTH 5 DAYS  Final   Report Status 03/05/2016 FINAL  Final    Coagulation Studies: No results for input(s): LABPROT, INR in the last 72 hours.  Urinalysis: No results for input(s): COLORURINE, LABSPEC, PHURINE, GLUCOSEU, HGBUR, BILIRUBINUR, KETONESUR, PROTEINUR, UROBILINOGEN, NITRITE, LEUKOCYTESUR in the last 72 hours.  Invalid input(s): APPERANCEUR    Imaging: Dg Chest Port 1 View  03/06/2016  CLINICAL DATA:  Respiratory failure. EXAM: PORTABLE CHEST 1 VIEW COMPARISON:  03/05/2016. FINDINGS: Endotracheal tube, NG tube, right IJ line in stable position. Prior CABG. Cardiomegaly with normal pulmonary vascularity. Low lung volumes with basilar atelectasis and/or infiltrates. Bilateral effusions. No pneumothorax. Findings are stable from prior exam. IMPRESSION: 1. Lines and tubes in stable position. 2. Prior CABG. Stable cardiomegaly. Persistent bilateral pulmonary infiltrates consistent with pulmonary edema and/or pneumonia.  No interim change. Persistent bilateral pleural effusions. Electronically Signed   By: Marcello Moores  Register   On: 03/06/2016 07:26   Dg Chest Port 1 View  03/05/2016  CLINICAL DATA:  Acute respiratory failure, history of CHF, diabetes, and renal failure. EXAM:  PORTABLE CHEST 1 VIEW COMPARISON:  Portable chest x-ray of March 04, 2016 FINDINGS: The lungs are mildly hypoinflated. Bibasilar parenchymal density with obscuration of the hemidiaphragms persists. The cardiac silhouette is top-normal in size. The central pulmonary vascularity is mildly prominent. There is evidence of previous CABG. The endotracheal tube tip projects 3.8 cm above the carina. The esophagogastric tube tip is in stable position in the gastric cardia or hiatal hernia. The right internal jugular venous catheter tip projects over the midportion of the SVC IMPRESSION: Persistent bibasilar atelectasis or pneumonia and small bilateral pleural effusions. Improved appearance of the pulmonary vascularity in the mid and upper lungs may reflect improving CHF. Electronically Signed   By: David  Martinique M.D.   On: 03/05/2016 07:26   Dg Chest Port 1 View  03/04/2016  CLINICAL DATA:  75 year old admitted with septic arthritis involving the knee, now with ventilator dependent respiratory failure. Followup basilar atelectasis. EXAM: PORTABLE CHEST 1 VIEW COMPARISON:  03/03/2016 and earlier. FINDINGS: Endotracheal tube tip in satisfactory position projecting approximately 3-4 cm above the carina. Right subclavian central venous catheter tip projects over the upper SVC, unchanged. Nasogastric tube is looped in in the stomach which is in a hiatal hernia. Further worsening of aeration in the lower lobes since yesterday. Evidence of mild interstitial pulmonary edema currently. Possible small bilateral pleural effusions. IMPRESSION: 1. Support apparatus satisfactory. Nasogastric tube looped in the stomach which is in a hiatal hernia. 2. New mild diffuse interstitial pulmonary edema and possible small bilateral pleural effusions indicating CHF and/or fluid overload. 3. Worsening bibasilar atelectasis. Electronically Signed   By: Evangeline Dakin M.D.   On: 03/04/2016 12:22     Medications:   . feeding supplement (VITAL  AF 1.2 CAL) Stopped (03/06/16 0807)  . fentaNYL infusion INTRAVENOUS Stopped (03/06/16 0805)  . norepinephrine (LEVOPHED) Adult infusion Stopped (03/06/16 0213)  . propofol (DIPRIVAN) infusion 10 mcg/kg/min (03/06/16 0806)  . pureflow 2,500 mL/hr at 03/06/16 0348   . antiseptic oral rinse  7 mL Mouth Rinse QID  . aspirin  81 mg Per Tube Daily  . budesonide (PULMICORT) nebulizer solution  0.5 mg Nebulization BID  . chlorhexidine gluconate (SAGE KIT)  15 mL Mouth Rinse BID  . famotidine  20 mg Per Tube Q24H  . feeding supplement (PRO-STAT SUGAR FREE 64)  30 mL Per Tube BID  . gabapentin  300 mg Per Tube QHS  . heparin subcutaneous  5,000 Units Subcutaneous 3 times per day  . hydrocortisone sod succinate (SOLU-CORTEF) inj  50 mg Intravenous Q12H  . insulin aspart  0-20 Units Subcutaneous 6 times per day  . insulin glargine  20 Units Subcutaneous Daily  . ipratropium-albuterol  3 mL Nebulization Q6H  . pravastatin  40 mg Oral q1800  . sodium chloride 0.9 % 250 mL with penicillin G potassium 3 Million Units infusion   Intravenous 4 times per day  . sodium chloride flush  3 mL Intravenous Q12H   acetaminophen **OR** acetaminophen, heparin, ondansetron **OR** ondansetron (ZOFRAN) IV, sodium chloride flush  Assessment/ Plan:  75 y.o. Caucasian male  with significant medical history of coronary disease with four-vessel CABG in 1998, gallbladder rupture, extensive surgery, Diabetes with complications of retinopathy, peripheral neuropathy, peripheral vascular disease, Aorto  iliac bypass, angioplasty and stent in his left leg, kyphoplasty for chronic back pain, CKD st 3 with Baseline Cr 1.5/ GFR 46  1. 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 Rt Knee septic arthritis 3. NSTEMI 4. DM-2 with CKD 5. Hypotension 6. Acute resp faliure. Intubated 4/3 7. Hypokalemia 8. Hyponatremia 9. Septic shock. 10. Metabolic Acidosis.  Plan: Patient still has significant volume input. His weight is rising. Therefore we  will increase ultrafiltration rate to 100 cc per hour. Continue to monitor electrolytes closely while on continuous renal replacement therapy. He remains intubated at this time. Continue ventilatory support. Hopefully ultrafiltration will aid in weaning the patient from the ventilator. As before overall prognosis remains guarded.   LOS: 9 Williette Loewe 4/11/20178:11 AM

## 2016-03-06 NOTE — Progress Notes (Signed)
Patient is currently on a vent. Clinical Education officer, museum (CSW) will follow up with patient and family about SNF disposition at a later time.   Blima Rich, LCSW [phone]

## 2016-03-06 NOTE — Progress Notes (Signed)
[place] for Medication dose adjustment/monitoring Indication: CRRT  Allergies  Allergen Reactions  . Tape Rash    Patient Measurements: Height: 6' (182.9 cm) Weight: 233 lb 11 oz (106 kg) IBW/kg (Calculated) : 77.6   Labs:  Recent Labs  03/04/16 0530  03/05/16 0557  03/06/16 0211 03/06/16 0513 03/06/16 0925  WBC 11.3*  --  7.9  --   --  11.6*  --   HGB 7.4*  --  8.2*  --   --  8.5*  --   HCT 21.3*  --  23.1*  --   --  25.5*  --   PLT 130*  --  110*  --   --  81*  --   CREATININE  --   < > 1.01  < > 0.84 0.90  0.76 0.81  MG  --   < >  --   < > 2.0 1.8 2.0  PHOS  --   < > 2.8  < > 2.3* 2.2* 2.1*  ALBUMIN  --   < > 1.3*  < > 1.3* 1.3*  1.4* 1.3*  PROT  --   --   --   --   --  5.6*  --   AST  --   --   --   --   --  48*  --   ALT  --   --   --   --   --  28  --   ALKPHOS  --   --   --   --   --  77  --   BILITOT  --   --   --   --   --  0.6  --   < > = values in this interval not displayed. Estimated Creatinine Clearance: 102.2 mL/min (by C-G formula based on Cr of 0.81).   Assessment: Pharmacy consulted to monitor and adjust medication doses per CRRT.   Plan:  No further adjustments warranted at this time.   Pharmacy will continue to monitor and adjust per consult.   Simpson,Michael L 03/06/2016,10:53 AM

## 2016-03-06 NOTE — Progress Notes (Signed)
Inpatient Diabetes Program Recommendations  AACE/ADA: New Consensus Statement on Inpatient Glycemic Control (2015)  Target Ranges:  Prepandial:   less than 140 mg/dL      Peak postprandial:   less than 180 mg/dL (1-2 hours)      Critically ill patients:  140 - 180 mg/dL  Results for John Schultz, John Schultz (MRN [id number]) as of 03/06/2016 09:30  Ref. Range 03/05/2016 07:38 03/05/2016 08:29 03/05/2016 09:45 03/05/2016 10:36 03/05/2016 11:34 03/05/2016 16:41 03/05/2016 19:23 03/05/2016 23:47 03/06/2016 03:37 03/06/2016 07:37  Glucose-Capillary Latest Ref Range: 65-99 mg/dL 148 (H) 146 (H) 164 (H) 148 (H) 151 (H) 177 (H) 218 (H) 214 (H) 241 (H) 223 (H)   Review of Glycemic Control  Diabetes history: DM2 Outpatient Diabetes medications: Lantus 15 units QPM, Novolog 5 units TID with meals, Metformin 1500 mg QPM Current orders for Inpatient glycemic control: Lantus 20 units daily, Novolog 0-20 units Q4H  Inpatient Diabetes Program Recommendations: Insulin - Basal: If steroids are continued as ordered, please consider increasing Lantus to 26 units daily (based on 106 kg x 0.25 units) Insulin - Meal Coverage: If patient remains on steroids and tube feedings, please consider ordering Novolog 4 units Q4H for tube feeding coverage. Please note that if tube feeding is stopped or held then Novolog tube feeding coverage would also be stopped or held.  Thanks, Barnie Alderman, RN, MSN, CDE Diabetes Coordinator Inpatient Diabetes Program [phone] (Team Pager from [place] to [place]) [phone] ([facility] office) [phone] [facility] office) [phone] [facility] office)

## 2016-03-06 NOTE — Progress Notes (Signed)
Attempted to wean patient. Turned fentanyl and propofol off. Patient had an increase in heart rate and labored breathing. He was unable to follow any simple commands. Per Dr. Alva Garnet discontinued fentanyl drip, ordered PRN and will re-attempt to wean tomorrow.

## 2016-03-06 NOTE — Progress Notes (Signed)
Physical Therapy Treatment Patient Details Name: John Schultz MRN: [id number] DOB: [birth year] Today's Date: 03/06/2016    History of Present Illness Pt is a 75 y.o. male with PMH of HTN, CHF, CKD, diabetes and thoracic DDD (s/p kyphoplasty), s/p median sternotomy.  Pt presented with R knee pain, fever, SOB and cough.  Pt was admitted septic arthritis (03/10/2016).  Pt s/p R knee aspiration 03/04/2016.  Pt was initially admitted to the general floor.  Pt was transferred to ICU due to septic shock (02-27-16).  Pt was intubated (02-27-16), displayed an NSTEMI (02-27-16) and was sedated on 02-27-16.  Pt is s/p temporary R femoral dialysis catheter (03-05-16).        PT Comments    LE PROM for 30 seconds each set was performed on pt in bed.  Pt is s/p temporary R femoral dialysis catheter.  Per PT guideline, no R hip ROM was performed.  Pt appeared to have increased stiffness in L hip internal rotation.  Pt also appeared to have decreased stiffness in R knee extension. Next session PT will educate wife on proper PROM exercise with temporary R femoral dialysis catheter.  Pt's wife not present to educate during session.     Follow Up Recommendations  SNF     Equipment Recommendations   (TBD upon further assessment )    Recommendations for Other Services       Precautions / Restrictions Precautions Precautions: Other (comment) Precaution Comments: mechanical ventilation, elevate HOB >30 degrees, R IJ central line, nasogastric tube, R temporary dialysis femoral catheter  Restrictions Weight Bearing Restrictions: No    Mobility  Bed Mobility               General bed mobility comments: not assessed due to sedation   Transfers                    Ambulation/Gait                 Stairs            Wheelchair Mobility    Modified Rankin (Stroke Patients Only)       Balance                                    Cognition Arousal/Alertness: Lethargic    Overall Cognitive Status: Difficult to assess                      Exercises General Exercises - Lower Extremity Ankle Circles/Pumps: PROM;Both  Knee extension PROM: both Knee flexion PROM: L only  Hip flexion: L only Hip internal rotation: L only     General Comments   Nursing was contacted and cleared pt for physical therapy.  Pt was sedated and intubated and thus agreement could not be determined. Session was modified due to R temporary femoral dialysis catheter and sedation.  Nursing was present at the end of the session.          Pertinent Vitals/Pain Pain Assessment: Faces Faces Pain Scale: Hurts a little bit Pain Location: PROM L hip  Pain Intervention(s): Limited activity within patient's tolerance;Monitored during session;Repositioned  See flow sheet for vitals.     Home Living                      Prior Function  PT Goals (current goals can now be found in the care plan section) Acute Rehab PT Goals Patient Stated Goal: to have better mobility  PT Goal Formulation: With family Time For Goal Achievement: 03/16/16 Potential to Achieve Goals: Fair Progress towards PT goals: Progressing toward goals    Frequency  Min 2X/week    PT Plan      Co-evaluation             End of Session Equipment Utilized During Treatment: Oxygen (intubation ) Activity Tolerance: Patient limited by lethargy Patient left: in bed     Time: [id number] PT Time Calculation (min) (ACUTE ONLY): 9 min  Charges:                       G Codes:      Mittie Bodo, SPT  Mittie Bodo 03/06/2016, 11:44 AM

## 2016-03-06 NOTE — Progress Notes (Signed)
Patient on propofol, discontinued fentanyl. Unable to wean (refer to previous note). Patients BP WNL, levophed titrated off yesterday. Tolerating tube feed, 200 ml of diarrhea. Cdiff pending patients placed on precautions. CRRT running with no complications, site intact. Wife updated throughout shift.

## 2016-03-07 ENCOUNTER — Inpatient Hospital Stay: Payer: Commercial Managed Care - HMO

## 2016-03-07 DIAGNOSIS — R6520 Severe sepsis without septic shock: Secondary | ICD-10-CM

## 2016-03-07 LAB — RENAL FUNCTION PANEL
ALBUMIN: 1.4 g/dL — AB (ref 3.5–5.0)
ALBUMIN: 1.3 g/dL — AB (ref 3.5–5.0)
ANION GAP: 3 — AB (ref 5–15)
ANION GAP: 5 (ref 5–15)
ANION GAP: 4 — AB (ref 5–15)
ANION GAP: 1 — AB (ref 5–15)
Albumin: 1.3 g/dL — ABNORMAL LOW (ref 3.5–5.0)
Albumin: 1.4 g/dL — ABNORMAL LOW (ref 3.5–5.0)
BUN: 30 mg/dL — ABNORMAL HIGH (ref 6–20)
BUN: 28 mg/dL — ABNORMAL HIGH (ref 6–20)
BUN: 28 mg/dL — ABNORMAL HIGH (ref 6–20)
BUN: 27 mg/dL — AB (ref 6–20)
CALCIUM: 7.9 mg/dL — AB (ref 8.9–10.3)
CALCIUM: 8 mg/dL — AB (ref 8.9–10.3)
CHLORIDE: 106 mmol/L (ref 101–111)
CHLORIDE: 108 mmol/L (ref 101–111)
CO2: 27 mmol/L (ref 22–32)
CO2: 27 mmol/L (ref 22–32)
CO2: 27 mmol/L (ref 22–32)
CO2: 29 mmol/L (ref 22–32)
CREATININE: 0.59 mg/dL — AB (ref 0.61–1.24)
Calcium: 8.1 mg/dL — ABNORMAL LOW (ref 8.9–10.3)
Calcium: 7.9 mg/dL — ABNORMAL LOW (ref 8.9–10.3)
Chloride: 107 mmol/L (ref 101–111)
Chloride: 106 mmol/L (ref 101–111)
Creatinine, Ser: 0.63 mg/dL (ref 0.61–1.24)
Creatinine, Ser: 0.58 mg/dL — ABNORMAL LOW (ref 0.61–1.24)
Creatinine, Ser: 0.53 mg/dL — ABNORMAL LOW (ref 0.61–1.24)
GFR calc Af Amer: >60 mL/min (ref >60)
GFR calc Af Amer: >60 mL/min (ref >60)
GFR calc Af Amer: >60 mL/min (ref >60)
GFR calc non Af Amer: >60 mL/min (ref >60)
GFR calc non Af Amer: >60 mL/min (ref >60)
GLUCOSE: 235 mg/dL — AB (ref 65–99)
GLUCOSE: 245 mg/dL — AB (ref 65–99)
GLUCOSE: 140 mg/dL — AB (ref 65–99)
Glucose, Bld: 240 mg/dL — ABNORMAL HIGH (ref 65–99)
PHOSPHORUS: 2.1 mg/dL — AB (ref 2.5–4.6)
PHOSPHORUS: 1.9 mg/dL — AB (ref 2.5–4.6)
POTASSIUM: 4.4 mmol/L (ref 3.5–5.1)
POTASSIUM: 4.1 mmol/L (ref 3.5–5.1)
POTASSIUM: 3.9 mmol/L (ref 3.5–5.1)
Phosphorus: 2.2 mg/dL — ABNORMAL LOW (ref 2.5–4.6)
Phosphorus: 2.3 mg/dL — ABNORMAL LOW (ref 2.5–4.6)
Potassium: 4.4 mmol/L (ref 3.5–5.1)
SODIUM: 137 mmol/L (ref 135–145)
SODIUM: 138 mmol/L (ref 135–145)
SODIUM: 138 mmol/L (ref 135–145)
Sodium: 137 mmol/L (ref 135–145)

## 2016-03-07 LAB — CBC
HCT: 27.1 % — ABNORMAL LOW (ref 40.0–52.0)
HEMOGLOBIN: 9 g/dL — AB (ref 13.0–18.0)
MCH: 29.8 pg (ref 26.0–34.0)
MCHC: 33.2 g/dL (ref 32.0–36.0)
MCV: 89.7 fL (ref 80.0–100.0)
Platelets: 88 10*3/uL — ABNORMAL LOW (ref 150–440)
RBC: 3.02 MIL/uL — ABNORMAL LOW (ref 4.40–5.90)
RDW: 14.7 % — ABNORMAL HIGH (ref 11.5–14.5)
WBC: 15.9 10*3/uL — ABNORMAL HIGH (ref 3.8–10.6)

## 2016-03-07 LAB — MAGNESIUM
MAGNESIUM: 1.8 mg/dL (ref 1.7–2.4)
MAGNESIUM: 1.7 mg/dL (ref 1.7–2.4)
Magnesium: 1.8 mg/dL (ref 1.7–2.4)
Magnesium: 1.7 mg/dL (ref 1.7–2.4)
Magnesium: 1.6 mg/dL — ABNORMAL LOW (ref 1.7–2.4)

## 2016-03-07 LAB — BASIC METABOLIC PANEL
ANION GAP: 4 — AB (ref 5–15)
BUN: 28 mg/dL — ABNORMAL HIGH (ref 6–20)
CALCIUM: 8 mg/dL — AB (ref 8.9–10.3)
CO2: 27 mmol/L (ref 22–32)
Chloride: 106 mmol/L (ref 101–111)
Creatinine, Ser: 0.61 mg/dL (ref 0.61–1.24)
GFR calc Af Amer: >60 mL/min (ref >60)
GFR calc non Af Amer: >60 mL/min (ref >60)
GLUCOSE: 246 mg/dL — AB (ref 65–99)
Potassium: 4.4 mmol/L (ref 3.5–5.1)
Sodium: 137 mmol/L (ref 135–145)

## 2016-03-07 LAB — GLUCOSE, CAPILLARY
GLUCOSE-CAPILLARY: 202 mg/dL — AB (ref 65–99)
GLUCOSE-CAPILLARY: 228 mg/dL — AB (ref 65–99)
Glucose-Capillary: 198 mg/dL — ABNORMAL HIGH (ref 65–99)
Glucose-Capillary: 119 mg/dL — ABNORMAL HIGH (ref 65–99)
Glucose-Capillary: 72 mg/dL (ref 65–99)

## 2016-03-07 LAB — PROCALCITONIN: PROCALCITONIN: 0.62 ng/mL

## 2016-03-07 MED ORDER — INSULIN GLARGINE 100 UNIT/ML ~~LOC~~ SOLN
30.0000 [IU] | Freq: Every day | SUBCUTANEOUS | Status: DC
  Administered 2016-03-07: 30 [IU] via SUBCUTANEOUS
  Filled 2016-03-07 (×2): qty 0.3

## 2016-03-07 MED ORDER — FENTANYL 2500MCG IN NS 250ML (10MCG/ML) PREMIX INFUSION
10.0000 ug/h | INTRAVENOUS | Status: DC
  Administered 2016-03-07: 25 ug/h via INTRAVENOUS

## 2016-03-07 MED ORDER — VITAL HIGH PROTEIN PO LIQD
1000.0000 mL | ORAL | Status: DC
  Administered 2016-03-07: 1000 mL

## 2016-03-07 MED ORDER — FENTANYL 2500MCG IN NS 250ML (10MCG/ML) PREMIX INFUSION
INTRAVENOUS | Status: AC
  Administered 2016-03-07: 25 ug/h via INTRAVENOUS
  Filled 2016-03-07: qty 250

## 2016-03-07 MED ORDER — DEXTROSE 5 % IV SOLN
3.0000 10*6.[IU] | Freq: Four times a day (QID) | INTRAVENOUS | Status: DC
  Administered 2016-03-07 – 2016-03-15 (×32): 3 10*6.[IU] via INTRAVENOUS
  Filled 2016-03-07 (×36): qty 3

## 2016-03-07 NOTE — Progress Notes (Signed)
Nutrition Follow-up      INTERVENTION:   EN: nutritional needs re-estimated; with current diprivan, recommend changing to Vital High Protein at goal of 55 ml/hr, continue Prostat TID; provides more protein while meeting 100% estimated calorie needs. Continue to assess   NUTRITION DIAGNOSIS:   Inadequate oral intake related to acute illness as evidenced by NPO status. Being addressed via TF  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, respiratory distress with severe acidosis requiring intubation on 02/27/16   Pt continues on CRRT, UF increased to 125 ml/hr today, pressure ulcers noted  Patient is currently intubated on ventilator support MV: 12 L/min Temp (24hrs), Avg:97.7 F (36.5 C), Min:97.5 F (36.4 C), Max:97.9 F (36.6 C)  Propofol: 12.7 ml/hr (335 kcals in 24 hours)  EN: tolerating Vital AF 1.2 at rate of 55 ml/hr, Prostat increased to TID yesterday  Digestive System: flexiseal with 400 mL liquid stool in 24 hours, abdomen soft/obese, BS hypoactive  Skin:   (stage I pressure ulcer on buttock, stage II on sacrum)  Last BM:  03/07/16 flexiseal  Height:   Ht Readings from Last 1 Encounters:  02/28/16 6' (1.829 m)    Weight:   Wt Readings from Last 1 Encounters:  03/07/16 233 lb 11 oz (106 kg)   Filed Weights   03/05/16 0430 03/06/16 0400 03/07/16 0500  Weight: 227 lb 4.7 oz (103.1 kg) 233 lb 11 oz (106 kg) 233 lb 11 oz (106 kg)    BMI:  Body mass index is 31.69 kg/(m^2).  Estimated Nutritional Needs:   Kcal:  1872 kcals (Ve: 12, tmax: 36.6) using wt of 88.8 kg  Protein:  132-176 g (1.5-2.0 g/kg)   Fluid:  >2 Liters  EDUCATION NEEDS:   Education needs no appropriate at this time  [place], [place], [place] [phone] Pager  [phone] Weekend/On-Call Pager

## 2016-03-07 NOTE — Progress Notes (Signed)
Subjective:  Patient seen at bedside. Doing well with CRRT. Discussed with Dr. Alva Garnet.    Objective:  Vital signs in last 24 hours:  Temp:  [97.5 F (36.4 C)-97.9 F (36.6 C)] 97.5 F (36.4 C) (04/12 0740) Pulse Rate:  [84-115] 90 (04/12 0800) Resp:  [11-27] 24 (04/12 0800) BP: (104-137)/(54-110) 104/71 mmHg (04/12 0800) SpO2:  [94 %-100 %] 98 % (04/12 0800) FiO2 (%):  [28 %-30 %] 28 % (04/12 0800) Weight:  [106 kg (233 lb 11 oz)] 106 kg (233 lb 11 oz) (04/12 0500)  Weight change: 0 kg (0 lb) Filed Weights   03/05/16 0430 03/06/16 0400 03/07/16 0500  Weight: 103.1 kg (227 lb 4.7 oz) 106 kg (233 lb 11 oz) 106 kg (233 lb 11 oz)    Intake/Output:    Intake/Output Summary (Last 24 hours) at 03/07/16 0851 Last data filed at 03/07/16 0800  Gross per 24 hour  Intake 1764.83 ml  Output   3543 ml  Net -1778.17 ml     Physical Exam: General: Critically ill appearing, lethargic  HEENT ETT  Neck supple  Pulm/lungs Vent assisted bilateral rhonchi  CVS/Heart Regular, soft systolic murmur  Abdomen:  Soft, non distended, BS present  Extremities: No peripheral edema, rt knee drain    Neurologic: Sedated,  Not following commands  Skin: Heel skin breakdown  GU: Foley       Basic Metabolic Panel:   Recent Labs Lab 03/06/16 0925 03/06/16 1400 03/06/16 1737 03/06/16 2151 03/06/16 2155 03/07/16 0256 03/07/16 0257 03/07/16 0543  NA 135 136 137  --  136  --  137 138  137  K 3.8 4.1 4.1  --  4.0  --  4.4 4.4  4.4  CL 105 105 106  --  106  --  107 106  106  CO2 _0 --  27  --  _1 GLUCOSE 198* 188* 181*  --  185*  --  235* 245*  246*  BUN 35* 34* 31*  --  29*  --  30* 28*  28*  CREATININE 0.81 0.74 0.70  --  0.62  --  0.59* 0.63  0.61  CALCIUM 7.6* 7.8* 7.8*  --  7.8*  --  7.9* 8.0*  8.0*  MG 2.0 1.9  --  1.8  --  1.8  --  1.7  PHOS 2.1* 2.3* 2.2*  --  2.1*  --  2.2* 2.3*     CBC:  Recent Labs Lab 03/03/16 0506 03/03/16 1049  03/04/16 0530 03/05/16 0557 03/06/16 0513 03/07/16 0543  WBC 11.8*  --  11.3* 7.9 11.6* 15.9*  HGB 7.4* 7.4* 7.4* 8.2* 8.5* 9.0*  HCT 21.8* 21.2* 21.3* 23.1* 25.5* 27.1*  MCV 87.9  --  87.4 87.9 89.7 89.7  PLT 139*  --  130* 110* 81* 88*      Microbiology:  Recent Results (from the past 720 hour(s))  Blood culture (routine x 2)     Status: Abnormal   Collection Time: 03/03/2016 12:59 PM  Result Value Ref Range Status   Specimen Description BLOOD LEFT FOREARM  Final   Special Requests BOTTLES DRAWN AEROBIC AND ANAEROBIC  1CC  Final   Culture  Setup Time   Final    GRAM POSITIVE COCCI AEROBIC BOTTLE ONLY CRITICAL VALUE NOTED.  VALUE IS CONSISTENT WITH PREVIOUSLY REPORTED AND CALLED VALUE.    Culture STREPTOCOCCUS PYOGENES AEROBIC BOTTLE ONLY  (A)  Final   Report Status 03/03/2016  FINAL  Final   Organism ID, Bacteria STREPTOCOCCUS PYOGENES  Final      Susceptibility   Streptococcus pyogenes - MIC*    CLINDAMYCIN Value in next row Sensitive      SENSITIVE0.25    AMPICILLIN Value in next row Sensitive      SENSITIVE0.25    ERYTHROMYCIN Value in next row Sensitive      SENSITIVE0.12    VANCOMYCIN Value in next row Sensitive      SENSITIVE0.5    CEFTRIAXONE Value in next row Sensitive      SENSITIVE0.12    LEVOFLOXACIN Value in next row Sensitive      SENSITIVE0.5    * STREPTOCOCCUS PYOGENES  Rapid Influenza A&B Antigens ([facility] only)     Status: None   Collection Time: 02/25/2016  1:00 PM  Result Value Ref Range Status   Influenza A ([facility]) NEGATIVE NEGATIVE Final   Influenza B ([facility]) NEGATIVE NEGATIVE Final  Blood culture (routine x 2)     Status: None   Collection Time: 03/10/2016  1:40 PM  Result Value Ref Range Status   Specimen Description BLOOD LEFT FOREARM  Final   Special Requests BOTTLES DRAWN AEROBIC AND ANAEROBIC  4CC  Final   Culture  Setup Time   Final    GRAM POSITIVE COCCI IN BOTH AEROBIC AND ANAEROBIC BOTTLES CRITICAL RESULT CALLED TO, READ BACK BY AND  VERIFIED WITH: NATE COOKSON AT 0426 02/27/16 CAF    Culture   Final    STREPTOCOCCUS PYOGENES IN BOTH AEROBIC AND ANAEROBIC BOTTLES    Report Status 02/29/2016 FINAL  Final   Organism ID, Bacteria STREPTOCOCCUS PYOGENES  Final      Susceptibility   Streptococcus pyogenes - MIC*    CLINDAMYCIN Value in next row Sensitive      SENSITIVE<=0.25    AMPICILLIN Value in next row Sensitive      SENSITIVE<=0.25    ERYTHROMYCIN Value in next row Sensitive      SENSITIVE0.12    VANCOMYCIN Value in next row Sensitive      SENSITIVE0.5    CEFTRIAXONE Value in next row Sensitive      SENSITIVE0.12    LEVOFLOXACIN Value in next row Sensitive      SENSITIVE0.5    * STREPTOCOCCUS PYOGENES  Blood Culture ID Panel (Reflexed)     Status: Abnormal   Collection Time: 03/09/2016  1:40 PM  Result Value Ref Range Status   Enterococcus species NOT DETECTED NOT DETECTED Final   Vancomycin resistance NOT DETECTED NOT DETECTED Final   Listeria monocytogenes NOT DETECTED NOT DETECTED Final   Staphylococcus species NOT DETECTED NOT DETECTED Final   Staphylococcus aureus NOT DETECTED NOT DETECTED Final   Methicillin resistance NOT DETECTED NOT DETECTED Final   Streptococcus species NOT DETECTED NOT DETECTED Final   Streptococcus agalactiae NOT DETECTED NOT DETECTED Final   Streptococcus pneumoniae NOT DETECTED NOT DETECTED Final   Streptococcus pyogenes DETECTED (A) NOT DETECTED Final    Comment: CRITICAL RESULT CALLED TO, READ BACK BY AND VERIFIED WITH: NATE COOKSON AT 5361 02/27/16 CAF    Acinetobacter baumannii NOT DETECTED NOT DETECTED Final   Enterobacteriaceae species NOT DETECTED NOT DETECTED Final   Enterobacter cloacae complex NOT DETECTED NOT DETECTED Final   Escherichia coli NOT DETECTED NOT DETECTED Final   Klebsiella oxytoca NOT DETECTED NOT DETECTED Final   Klebsiella pneumoniae NOT DETECTED NOT DETECTED Final   Proteus species NOT DETECTED NOT DETECTED Final   Serratia marcescens NOT DETECTED  NOT DETECTED Final   Carbapenem resistance NOT DETECTED NOT DETECTED Final   Haemophilus influenzae NOT DETECTED NOT DETECTED Final   Neisseria meningitidis NOT DETECTED NOT DETECTED Final   Pseudomonas aeruginosa NOT DETECTED NOT DETECTED Final   Candida albicans NOT DETECTED NOT DETECTED Final   Candida glabrata NOT DETECTED NOT DETECTED Final   Candida krusei NOT DETECTED NOT DETECTED Final   Candida parapsilosis NOT DETECTED NOT DETECTED Final   Candida tropicalis NOT DETECTED NOT DETECTED Final  Body fluid culture     Status: None   Collection Time: 03/18/2016  3:05 PM  Result Value Ref Range Status   Specimen Description R Knee  Final   Special Requests NONE  Final   Gram Stain   Final    MODERATE WBC SEEN MODERATE GRAM POSITIVE COCCI IN PAIRS AND CHAINS    Culture   Final    HEAVY GROWTH GROUP A STREP (S.PYOGENES) ISOLATED There is no known Penicillin Resistant Beta Streptococcus in the [place] For patients that are Penicillin-allergic, Erythromycin is 85-94% susceptible, and Clindamycin is 80% susceptible.  Contact Microbiology within 7 days if sensitivity testing is  required.   NO ANAEROBES ISOLATED    Report Status 02/29/2016 FINAL  Final  Body fluid culture     Status: None   Collection Time: 03/10/2016  4:10 PM  Result Value Ref Range Status   Specimen Description R Knee  Final   Special Requests NONE  Final   Gram Stain   Final    MANY WBC SEEN MANY GRAM POSITIVE COCCI IN PAIRS AND CHAINS    Culture   Final    HEAVY GROWTH GROUP A STREP (S.PYOGENES) ISOLATED There is no known Penicillin Resistant Beta Streptococcus in the [place] For patients that are Penicillin-allergic, Erythromycin is 85-94% susceptible, and Clindamycin is 80% susceptible.  Contact Microbiology within 7 days if sensitivity testing is  required.   NO ANAEROBES ISOLATED    Report Status 02/29/2016 FINAL  Final  MRSA PCR Screening     Status: None   Collection Time: 02/27/16  3:35 AM  Result Value Ref  Range Status   MRSA by PCR NEGATIVE NEGATIVE Final    Comment:        The GeneXpert MRSA Assay (FDA approved for NASAL specimens only), is one component of a comprehensive MRSA colonization surveillance program. It is not intended to diagnose MRSA infection nor to guide or monitor treatment for MRSA infections.   Urine culture     Status: None   Collection Time: 02/27/16  8:30 AM  Result Value Ref Range Status   Specimen Description URINE, RANDOM  Final   Special Requests NONE  Final   Culture NO GROWTH 1 DAY  Final   Report Status 02/28/2016 FINAL  Final  Culture, blood (single) w Reflex to PCR ID Panel     Status: None   Collection Time: 02/29/16  2:36 PM  Result Value Ref Range Status   Specimen Description BLOOD LEFT HAND  Final   Special Requests BOTTLES DRAWN AEROBIC AND ANAEROBIC [place]  Final   Culture NO GROWTH 5 DAYS  Final   Report Status 03/05/2016 FINAL  Final  C difficile quick scan w PCR reflex     Status: None   Collection Time: 03/06/16  5:37 PM  Result Value Ref Range Status   C Diff antigen NEGATIVE NEGATIVE Final   C Diff toxin NEGATIVE NEGATIVE Final   C Diff interpretation Negative for C. difficile  Final  Coagulation Studies: No results for input(s): LABPROT, INR in the last 72 hours.  Urinalysis: No results for input(s): COLORURINE, LABSPEC, PHURINE, GLUCOSEU, HGBUR, BILIRUBINUR, KETONESUR, PROTEINUR, UROBILINOGEN, NITRITE, LEUKOCYTESUR in the last 72 hours.  Invalid input(s): APPERANCEUR    Imaging: Dg Chest Port 1 View  03/07/2016  CLINICAL DATA:  Respiratory failure, acute renal failure, NSTEMI, septic shock. EXAM: PORTABLE CHEST 1 VIEW COMPARISON:  Portable chest x-ray of March 06, 2016 FINDINGS: The lungs remain hypoinflated. There are persistent bilateral pleural effusions. Bibasilar atelectasis or pneumonia is present. The cardiac silhouette is mildly enlarged. The central pulmonary vascularity remains engorged. The patient has  undergone previous CABG. The endotracheal tube tip lies approximately 2 point 9 cm above the carina. The right internal jugular venous catheter tip projects over the proximal SVC. The esophagogastric tube tip projects in the region of the gastric cardia with portions of the tube coiled in the gastric fundus. IMPRESSION: Slight interval improvement in bibasilar atelectasis. Persistent hypoinflation with small bilateral pleural effusions. Mild central pulmonary vascular congestion. Electronically Signed   By: David  [place]ique M.D.   On: 03/07/2016 07:14   Dg Chest Port 1 View  03/06/2016  CLINICAL DATA:  Respiratory failure. EXAM: PORTABLE CHEST 1 VIEW COMPARISON:  03/05/2016. FINDINGS: Endotracheal tube, NG tube, right IJ line in stable position. Prior CABG. Cardiomegaly with normal pulmonary vascularity. Low lung volumes with basilar atelectasis and/or infiltrates. Bilateral effusions. No pneumothorax. Findings are stable from prior exam. IMPRESSION: 1. Lines and tubes in stable position. 2. Prior CABG. Stable cardiomegaly. Persistent bilateral pulmonary infiltrates consistent with pulmonary edema and/or pneumonia. No interim change. Persistent bilateral pleural effusions. Electronically Signed   By: Marcello Moores  Register   On: 03/06/2016 07:26     Medications:   . feeding supplement (VITAL AF 1.2 CAL) 1,000 mL (03/07/16 0600)  . propofol (DIPRIVAN) infusion 5 mcg/kg/min (03/07/16 0835)  . pureflow 2,500 mL/hr at 03/06/16 0800   . antiseptic oral rinse  7 mL Mouth Rinse QID  . aspirin  81 mg Per Tube Daily  . budesonide (PULMICORT) nebulizer solution  0.5 mg Nebulization BID  . chlorhexidine gluconate (SAGE KIT)  15 mL Mouth Rinse BID  . famotidine  20 mg Per Tube Q24H  . feeding supplement (PRO-STAT SUGAR FREE 64)  30 mL Per Tube TID  . gabapentin  300 mg Per Tube QHS  . insulin aspart  0-20 Units Subcutaneous 6 times per day  . insulin glargine  30 Units Subcutaneous Daily  . ipratropium-albuterol  3  mL Nebulization Q6H  . pravastatin  40 mg Per Tube q1800  . sodium chloride 0.9 % 250 mL with penicillin G potassium 3 Million Units infusion   Intravenous 4 times per day  . sodium chloride flush  3 mL Intravenous Q12H   acetaminophen **OR** [DISCONTINUED] acetaminophen, fentaNYL (SUBLIMAZE) injection, heparin, ondansetron **OR** ondansetron (ZOFRAN) IV, sodium chloride flush  Assessment/ Plan:  75 y.o. Caucasian male  with significant medical history of coronary disease with four-vessel CABG in 1998, gallbladder rupture, extensive surgery, Diabetes with complications of retinopathy, peripheral neuropathy, peripheral vascular disease, Aorto iliac bypass, angioplasty and stent in his left leg, kyphoplasty for chronic back pain, CKD st 3 with Baseline Cr 1.5/ GFR 46  1. 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 Rt Knee septic arthritis 3. NSTEMI 4. DM-2 with CKD 5. Hypotension 6. Acute resp faliure. Intubated 4/3 7. Hypokalemia 8. Hyponatremia 9. Septic shock. 10. Metabolic Acidosis.  Plan: Urine output had actually improved yesterday.  However overall the patient has significant volume on board. We will continue to perform continuous renal replacement therapy. We will increase ultrafiltration rate to 125 cc per hour. Continue to monitor serum electrolytes. We remain hopeful that his renal function will slowly improve over time. Continue ventilatory support under the instruction of pulmonary critical care.   LOS: [place] 4/12/20178:51 AM

## 2016-03-07 NOTE — Progress Notes (Signed)
CRRT filter changed due to clotting.

## 2016-03-07 NOTE — Progress Notes (Addendum)
[place] INFECTIOUS DISEASE PROGRESS NOTE Date of Admission:  02/28/2016     ID: John Schultz is a 75 y.o. male with  Grp A strep septic R knee and bacteremia  Principal Problem:   Septic arthritis of knee, right (HCC) Active Problems:   ARF (acute renal failure) (HCC)   Acute bronchitis   Hyponatremia   NSTEMI (non-ST elevated myocardial infarction) (HCC)   Pressure ulcer   Septic shock (HCC)   Acute respiratory failure (HCC)   Subjective: Remains intubated, due to issues with agitation Having increased secretions as well. Remains AF but wbc creeping up.  ROS  intubated  Medications:  Antibiotics Given (last 72 hours)    Date/Time Action Medication Dose Rate   03/07/16 1231 Given   penicillin G potassium 3 Million Units in dextrose 5 % 100 mL IVPB 3 Million Units 200 mL/hr     . antiseptic oral rinse  7 mL Mouth Rinse QID  . aspirin  81 mg Per Tube Daily  . budesonide (PULMICORT) nebulizer solution  0.5 mg Nebulization BID  . chlorhexidine gluconate (SAGE KIT)  15 mL Mouth Rinse BID  . famotidine  20 mg Per Tube Q24H  . feeding supplement (PRO-STAT SUGAR FREE 64)  30 mL Per Tube TID  . gabapentin  300 mg Per Tube QHS  . insulin aspart  0-20 Units Subcutaneous 6 times per day  . insulin glargine  30 Units Subcutaneous Daily  . ipratropium-albuterol  3 mL Nebulization Q6H  . pencillin G potassium IV  3 Million Units Intravenous 4 times per day  . pravastatin  40 mg Per Tube q1800  . sodium chloride flush  3 mL Intravenous Q12H    Objective: Vital signs in last 24 hours: Temp:  [97.5 F (36.4 C)-97.9 F (36.6 C)] 97.8 F (36.6 C) (04/12 1056) Pulse Rate:  [84-113] 95 (04/12 1200) Resp:  [11-28] 28 (04/12 1200) BP: (104-148)/(54-80) 106/67 mmHg (04/12 1200) SpO2:  [94 %-100 %] 99 % (04/12 1200) FiO2 (%):  [28 %-30 %] 28 % (04/12 1200) Weight:  [106 kg (233 lb 11 oz)] 106 kg (233 lb 11 oz) (04/12 0500) Constitutional: intubated, critically ill appearing.   HENT: perrla, eomi Mouth/Throat: Oropharynx - etet in place  Cardiovascular: tachy, reg Pulmonary/Chest: mech bs Abdominal: Soft. Bowel sounds are normal. He exhibits no distension. There is no tenderness.  Lymphadenopathy: He has no cervical adenopathy.  Neurological: intubated, sedated  Skin: Skin is warm and dry. No rash noted. No erythema.  Psychiatric: sedated Ext - L knee wrapped post op  Lab Results  Recent Labs  03/06/16 0513  03/07/16 0543 03/07/16 0922  WBC 11.6*  --  15.9*  --   HGB 8.5*  --  9.0*  --   HCT 25.5*  --  27.1*  --   NA 133*  133*  < > 138  137 137  K 4.0  4.0  < > 4.4  4.4 4.1  CL 104  103  < > 106  106 106  CO2 26  27  < > 27  27 27   BUN 35*  35*  < > 28*  28* 28*  CREATININE 0.90  0.76  < > 0.63  0.61 0.58*  < > = values in this interval not displayed.  Microbiology: Results for orders placed or performed during the hospital encounter of 03/19/2016  Blood culture (routine x 2)     Status: Abnormal   Collection Time: 03/15/2016 12:59 PM  Result Value Ref Range Status   Specimen Description BLOOD LEFT FOREARM  Final   Special Requests BOTTLES DRAWN AEROBIC AND ANAEROBIC  1CC  Final   Culture  Setup Time   Final    GRAM POSITIVE COCCI AEROBIC BOTTLE ONLY CRITICAL VALUE NOTED.  VALUE IS CONSISTENT WITH PREVIOUSLY REPORTED AND CALLED VALUE.    Culture STREPTOCOCCUS PYOGENES AEROBIC BOTTLE ONLY  (A)  Final   Report Status 03/03/2016 FINAL  Final   Organism ID, Bacteria STREPTOCOCCUS PYOGENES  Final      Susceptibility   Streptococcus pyogenes - MIC*    CLINDAMYCIN Value in next row Sensitive      SENSITIVE0.25    AMPICILLIN Value in next row Sensitive      SENSITIVE0.25    ERYTHROMYCIN Value in next row Sensitive      SENSITIVE0.12    VANCOMYCIN Value in next row Sensitive      SENSITIVE0.5    CEFTRIAXONE Value in next row Sensitive      SENSITIVE0.12    LEVOFLOXACIN Value in next row Sensitive      SENSITIVE0.5    *  STREPTOCOCCUS PYOGENES  Rapid Influenza A&B Antigens ([facility] only)     Status: None   Collection Time: 03/15/2016  1:00 PM  Result Value Ref Range Status   Influenza A ([facility]) NEGATIVE NEGATIVE Final   Influenza B ([facility]) NEGATIVE NEGATIVE Final  Blood culture (routine x 2)     Status: None   Collection Time: 02/29/2016  1:40 PM  Result Value Ref Range Status   Specimen Description BLOOD LEFT FOREARM  Final   Special Requests BOTTLES DRAWN AEROBIC AND ANAEROBIC  4CC  Final   Culture  Setup Time   Final    GRAM POSITIVE COCCI IN BOTH AEROBIC AND ANAEROBIC BOTTLES CRITICAL RESULT CALLED TO, READ BACK BY AND VERIFIED WITH: NATE COOKSON AT 0426 02/27/16 CAF    Culture   Final    STREPTOCOCCUS PYOGENES IN BOTH AEROBIC AND ANAEROBIC BOTTLES    Report Status 02/29/2016 FINAL  Final   Organism ID, Bacteria STREPTOCOCCUS PYOGENES  Final      Susceptibility   Streptococcus pyogenes - MIC*    CLINDAMYCIN Value in next row Sensitive      SENSITIVE<=0.25    AMPICILLIN Value in next row Sensitive      SENSITIVE<=0.25    ERYTHROMYCIN Value in next row Sensitive      SENSITIVE0.12    VANCOMYCIN Value in next row Sensitive      SENSITIVE0.5    CEFTRIAXONE Value in next row Sensitive      SENSITIVE0.12    LEVOFLOXACIN Value in next row Sensitive      SENSITIVE0.5    * STREPTOCOCCUS PYOGENES  Blood Culture ID Panel (Reflexed)     Status: Abnormal   Collection Time: 03/25/2016  1:40 PM  Result Value Ref Range Status   Enterococcus species NOT DETECTED NOT DETECTED Final   Vancomycin resistance NOT DETECTED NOT DETECTED Final   Listeria monocytogenes NOT DETECTED NOT DETECTED Final   Staphylococcus species NOT DETECTED NOT DETECTED Final   Staphylococcus aureus NOT DETECTED NOT DETECTED Final   Methicillin resistance NOT DETECTED NOT DETECTED Final   Streptococcus species NOT DETECTED NOT DETECTED Final   Streptococcus agalactiae NOT DETECTED NOT DETECTED Final   Streptococcus pneumoniae NOT DETECTED  NOT DETECTED Final   Streptococcus pyogenes DETECTED (A) NOT DETECTED Final    Comment: CRITICAL RESULT CALLED TO, READ BACK BY AND VERIFIED WITH:  NATE COOKSON AT 4967 02/27/16 CAF    Acinetobacter baumannii NOT DETECTED NOT DETECTED Final   Enterobacteriaceae species NOT DETECTED NOT DETECTED Final   Enterobacter cloacae complex NOT DETECTED NOT DETECTED Final   Escherichia coli NOT DETECTED NOT DETECTED Final   Klebsiella oxytoca NOT DETECTED NOT DETECTED Final   Klebsiella pneumoniae NOT DETECTED NOT DETECTED Final   Proteus species NOT DETECTED NOT DETECTED Final   Serratia marcescens NOT DETECTED NOT DETECTED Final   Carbapenem resistance NOT DETECTED NOT DETECTED Final   Haemophilus influenzae NOT DETECTED NOT DETECTED Final   Neisseria meningitidis NOT DETECTED NOT DETECTED Final   Pseudomonas aeruginosa NOT DETECTED NOT DETECTED Final   Candida albicans NOT DETECTED NOT DETECTED Final   Candida glabrata NOT DETECTED NOT DETECTED Final   Candida krusei NOT DETECTED NOT DETECTED Final   Candida parapsilosis NOT DETECTED NOT DETECTED Final   Candida tropicalis NOT DETECTED NOT DETECTED Final  Body fluid culture     Status: None   Collection Time: 03/07/2016  3:05 PM  Result Value Ref Range Status   Specimen Description R Knee  Final   Special Requests NONE  Final   Gram Stain   Final    MODERATE WBC SEEN MODERATE GRAM POSITIVE COCCI IN PAIRS AND CHAINS    Culture   Final    HEAVY GROWTH GROUP A STREP (S.PYOGENES) ISOLATED There is no known Penicillin Resistant Beta Streptococcus in the [place] For patients that are Penicillin-allergic, Erythromycin is 85-94% susceptible, and Clindamycin is 80% susceptible.  Contact Microbiology within 7 days if sensitivity testing is  required.   NO ANAEROBES ISOLATED    Report Status 02/29/2016 FINAL  Final  Body fluid culture     Status: None   Collection Time: 03/07/2016  4:10 PM  Result Value Ref Range Status   Specimen Description R Knee   Final   Special Requests NONE  Final   Gram Stain   Final    MANY WBC SEEN MANY GRAM POSITIVE COCCI IN PAIRS AND CHAINS    Culture   Final    HEAVY GROWTH GROUP A STREP (S.PYOGENES) ISOLATED There is no known Penicillin Resistant Beta Streptococcus in the [place] For patients that are Penicillin-allergic, Erythromycin is 85-94% susceptible, and Clindamycin is 80% susceptible.  Contact Microbiology within 7 days if sensitivity testing is  required.   NO ANAEROBES ISOLATED    Report Status 02/29/2016 FINAL  Final  MRSA PCR Screening     Status: None   Collection Time: 02/27/16  3:35 AM  Result Value Ref Range Status   MRSA by PCR NEGATIVE NEGATIVE Final    Comment:        The GeneXpert MRSA Assay (FDA approved for NASAL specimens only), is one component of a comprehensive MRSA colonization surveillance program. It is not intended to diagnose MRSA infection nor to guide or monitor treatment for MRSA infections.   Urine culture     Status: None   Collection Time: 02/27/16  8:30 AM  Result Value Ref Range Status   Specimen Description URINE, RANDOM  Final   Special Requests NONE  Final   Culture NO GROWTH 1 DAY  Final   Report Status 02/28/2016 FINAL  Final  Culture, blood (single) w Reflex to PCR ID Panel     Status: None   Collection Time: 02/29/16  2:36 PM  Result Value Ref Range Status   Specimen Description BLOOD LEFT HAND  Final   Special Requests BOTTLES DRAWN AEROBIC  AND ANAEROBIC [place]  Final   Culture NO GROWTH 5 DAYS  Final   Report Status 03/05/2016 FINAL  Final  C difficile quick scan w PCR reflex     Status: None   Collection Time: 03/06/16  5:37 PM  Result Value Ref Range Status   C Diff antigen NEGATIVE NEGATIVE Final   C Diff toxin NEGATIVE NEGATIVE Final   C Diff interpretation Negative for C. difficile  Final  Culture, respiratory (NON-Expectorated)     Status: None (Preliminary result)   Collection Time: 03/07/16  8:20 AM  Result Value Ref Range  Status   Specimen Description TRACHEAL ASPIRATE  Final   Special Requests NONE  Final   Gram Stain   Final    MODERATE WBC SEEN FEW SQUAMOUS EPITHELIAL CELLS PRESENT RARE YEAST    Culture PENDING  Incomplete   Report Status PENDING  Incomplete    Studies/Results: Dg Chest Port 1 View  03/07/2016  CLINICAL DATA:  Respiratory failure, acute renal failure, NSTEMI, septic shock. EXAM: PORTABLE CHEST 1 VIEW COMPARISON:  Portable chest x-ray of March 06, 2016 FINDINGS: The lungs remain hypoinflated. There are persistent bilateral pleural effusions. Bibasilar atelectasis or pneumonia is present. The cardiac silhouette is mildly enlarged. The central pulmonary vascularity remains engorged. The patient has undergone previous CABG. The endotracheal tube tip lies approximately 2 point 9 cm above the carina. The right internal jugular venous catheter tip projects over the proximal SVC. The esophagogastric tube tip projects in the region of the gastric cardia with portions of the tube coiled in the gastric fundus. IMPRESSION: Slight interval improvement in bibasilar atelectasis. Persistent hypoinflation with small bilateral pleural effusions. Mild central pulmonary vascular congestion. Electronically Signed   By: Isela Stantz  [place] M.D.   On: 03/07/2016 07:14   Dg Chest Port 1 View  03/06/2016  CLINICAL DATA:  Respiratory failure. EXAM: PORTABLE CHEST 1 VIEW COMPARISON:  03/05/2016. FINDINGS: Endotracheal tube, NG tube, right IJ line in stable position. Prior CABG. Cardiomegaly with normal pulmonary vascularity. Low lung volumes with basilar atelectasis and/or infiltrates. Bilateral effusions. No pneumothorax. Findings are stable from prior exam. IMPRESSION: 1. Lines and tubes in stable position. 2. Prior CABG. Stable cardiomegaly. Persistent bilateral pulmonary infiltrates consistent with pulmonary edema and/or pneumonia. No interim change. Persistent bilateral pleural effusions. Electronically Signed   By: Marcello Moores   Register   On: 03/06/2016 07:26    Assessment/Plan: John Schultz is a 75 y.o. male with Group A strep septic knee (Synovial WBC > 300K), sepsis, hypotensive, intubated, no obvious sourse with no evidence cellulitis and no PNA on cxr.  Echo neg for vegetation Fu bcx 4/5 ngtd Dr Rudene Christians following for the knee. Started on CRRT 4/12 - increasing wbc and secretions. C diff neg. Sputum cx sent. CXR without much change  Recommendations Continue  pcn  FU sputum cx  Continue supportive care. Has multiple lines in place - if wbc continues to increase or febrile ->repeat bcx Discussed with wife.  Thank you very much for the consult. Will follow with you.  Grove Defina P   03/07/2016, 1:39 PM

## 2016-03-07 NOTE — Progress Notes (Signed)
PT Cancellation Note  Patient Details Name: BENHARD ALPIZAR MRN: [id number] DOB: 19-Sep-1941   Cancelled Treatment:    Reason Eval/Treat Not Completed: Other (comment).  Pt's wife was educated regarding temporary femoral dialysis catheter precautions.  Pt's wife  verbalized understanding.  Pt's wife reported that she had not been performing PROM R LE due to temporary femoral catheter.  Pt's wife reported that she felt comfortable with new education.  Due to temporary femoral dialysis catheter (and pt is mechanically ventilated and sedated), PT is limited with LE ROM activities and pt's wife verbalizes that she is comfortable with allowed LE PROM ex's.  No current skilled PT needs identified d/t this.  PT will complete current PT order.  Please re-consult PT when pt is appropriate for PT.  Mittie Bodo, SPT Mittie Bodo 03/07/2016, 9:54 AM

## 2016-03-07 NOTE — Progress Notes (Signed)
PULMONARY / CRITICAL CARE MEDICINE   Name: John Schultz MRN: [id number] DOB: 09/05/1941    ADMISSION DATE:  03/17/2016  BRIEF HISTORY: 75 year old male past medical history of hypertension, chronic diastolic heart failure, CK, diabetes, presented on April 2 with four-day history of progressive right knee pain fever, shortness of breath, cough. Within 24 hours decompensated requiring intubation on 04/03.  Seen by orthopedics, had drain placed in right knee with purulent material. Now with mechanical ventilation and slowly been weaned off.   SUBJECTIVE:  No acute issues overnight. Remains on full vent support. CRRT initiated last night. Failed SBT 04/09 due to respiratory muscle fatique and agitation.  Positive Hemoccult 04/08; H/H stable at 8.2/23.1 post transfusion of 1 unit of PRBCs on 04/09.  SIGNIFICANT EVENTS/STUDIES: 04/02 admitted by Hospitalist Service with 4 day history of right knee pain - swollen, erythematous and tender on exam - and severe sepsis, AKI, abnormal EKG. Cardiac markers positive (peak troponin I 8.10) 04/03 Orthopedics consultation and aspiration of R Knee, with drain placement 04/03 ID consultation 04/03 Nephrology consultation 04/04 TTE: LVEF 60-65%, no vegetations 04/04 altered mental status, worsening respiratory status, septic, intubated 04/04 R knee hemovac removed by ortho 04/06 Cardiology consultation: NSTEMI - deemed secondary to demand ischemia. Further W/U to be considered after resolution of severe sepsis and critical illness 04/09 CRRT initiated  04/10 agitated on WUA. Failed SBT. Off and on norepienphrine 04/11 Tolerating volume removal by CRRT. Failed SBT. Tolerated PS 10-14 cm H2O 04/12 Failed SBT. Agitated on WUA. Uo improving  INDWELLING DEVICES:: R IJ CVL 04/03 >>  ETT 04/03 >>  R femoral HD cath 04/09 >>   MICRO DATA: 04/02 MRSA PCR >> NEG 04/02 flu screen >> NEG 04/02 blood >> Strep pyogenes 04/02 knee aspirate >> Strep  pyogenes 04/05 blood >> NEG 04/12 Resp >>  04/12 PCT protocol:   ANTIMICROBIALS:  Ceftriaxone 04/02 X 1  Vanc 04/02 >> 04/03 Pip-tazo 04/02 >> 04/03 Clinda 04/03 >> 04/07 Pen G 04/03 >>    VITAL SIGNS: Temp:  [97.5 F (36.4 C)-97.9 F (36.6 C)] 97.8 F (36.6 C) (04/12 1056) Pulse Rate:  [84-113] 102 (04/12 1100) Resp:  [11-27] 26 (04/12 1100) BP: (104-148)/(54-80) 136/78 mmHg (04/12 1100) SpO2:  [94 %-100 %] 96 % (04/12 1100) FiO2 (%):  [28 %-30 %] 28 % (04/12 1100) Weight:  [106 kg (233 lb 11 oz)] 106 kg (233 lb 11 oz) (04/12 0500) HEMODYNAMICS:   VENTILATOR SETTINGS: Vent Mode:  [-] Spontaneous FiO2 (%):  [28 %-30 %] 28 % Set Rate:  [15 bmp] 15 bmp Vt Set:  [550 mL] 550 mL PEEP:  [5 cmH20] 5 cmH20 Pressure Support:  [10 cmH20-19 cmH20] 10 cmH20 INTAKE / OUTPUT:  Intake/Output Summary (Last 24 hours) at 03/07/16 1241 Last data filed at 03/07/16 1100  Gross per 24 hour  Intake 2043.57 ml  Output   3195 ml  Net -1151.43 ml    Review of Systems  Unable to perform ROS: intubated    Physical Exam  Constitutional:  RASS -3, intubated  HENT:  Head: Normocephalic and atraumatic.  Eyes: EOM are normal. Pupils are equal, round, and reactive to light.  Neck: No JVD present.  Cardiovascular: Normal rate, regular rhythm and normal heart sounds.   No murmur heard. Pulmonary/Chest: No respiratory distress. He has no wheezes. He has no rales.  Abdominal: Soft. Bowel sounds are normal. He exhibits no distension. There is no tenderness.  Musculoskeletal: He exhibits edema.  Symmetric ankle edema.  R knee without edema or erythema  Neurological: He has normal reflexes.  Moves all extremities  Skin: Skin is warm and dry.     LABS:  CBC  Recent Labs Lab 03/05/16 0557 03/06/16 0513 03/07/16 0543  WBC 7.9 11.6* 15.9*  HGB 8.2* 8.5* 9.0*  HCT 23.1* 25.5* 27.1*  PLT 110* 81* 88*   Coag's No results for input(s): APTT, INR in the last 168 hours. BMET  Recent  Labs Lab 03/07/16 0257 03/07/16 0543 03/07/16 0922  NA 137 138  137 137  K 4.4 4.4  4.4 4.1  CL 107 106  106 106  CO2 27 27  27 27   BUN 30* 28*  28* 28*  CREATININE 0.59* 0.63  0.61 0.58*  GLUCOSE 235* 245*  246* 240*   Electrolytes  Recent Labs Lab 03/07/16 0256 03/07/16 0257 03/07/16 0543 03/07/16 0922  CALCIUM  --  7.9* 8.0*  8.0* 8.1*  MG 1.8  --  1.7 1.8  PHOS  --  2.2* 2.3* 2.1*   Sepsis Markers  Recent Labs Lab 03/02/16 0740 03/02/16 0812 03/07/16 0922  LATICACIDVEN  --  1.3  --   PROCALCITON 7.12  --  0.62   ABG  Recent Labs Lab 03/02/16 0921 03/03/16 1053  PHART 7.26* 7.47*  PCO2ART 34 31*  PO2ART 90 80*   Liver Enzymes  Recent Labs Lab 03/06/16 0513  03/07/16 0257 03/07/16 0543 03/07/16 0922  AST 48*  --   --   --   --   ALT 28  --   --   --   --   ALKPHOS 77  --   --   --   --   BILITOT 0.6  --   --   --   --   ALBUMIN 1.3*  1.4*  < > 1.3* 1.4* 1.4*  < > = values in this interval not displayed. Cardiac Enzymes No results for input(s): TROPONINI, PROBNP in the last 168 hours. Glucose  Recent Labs Lab 03/06/16 1532 03/06/16 1924 03/06/16 2356 03/07/16 0414 03/07/16 0719 03/07/16 1130  GLUCAP 176* 150* 195* 202* 228* 198*    CXR: slightly improved aeration. Edema pattern persists    ASSESSMENT / PLAN:  PULMONARY Acute vent dep respiratory failure Pulm edema pattern on CXR P: Cont vent support - settings reviewed and/or adjusted Cont vent bundle Daily SBT if/when meets criteria Continue volume removal as permitted by BP  CARDIOVASCULAR-NSTEMI Septic shock NSTEMI P:  MAP goal > 65 mmHg Norepinephrine as needed Further cardiac w/u after critical illness resolves  RENAL AKI Hypervolemia P: Monitor BMET intermittently Monitor I/Os Correct electrolytes as indicated Renal Service following Cont CRRT. UF -125 cc/hr  GASTROINTESTINAL Diarrhea  Hemoccult positive P: SUP: enteral famotidine Continue  TFs  HEMATOLOGIC Anemia without overt bleeding Thrombocytopenia  P: DVT px: SCDs Monitor CBC intermittently Transfuse per usual guidelines SQ heparin DC'd 04/12  INFECTIOUS Septic arthritis of the R knee Frothy purulent ET secretions P: Monitor temp, WBC count Micro and abx as above  ENDOCRINE DM 2 Hyperglycemia Presumed relative adrenal insufficiency - clinically resolved P: DC hydrocortisone 04/12  Cont Lantus - initiated 04/10 Cont SSI   NEUROLOGIC ICU acquired delirium P: RASS goal 0, -1 Continue propofol Cont PRN fentanyl Daily WUA  Disposition and family update: Wife updated @ bedside   CCM time: 35 mins The above time includes time spent in consultation with patient and/or family members and reviewing care plan on multidisciplinary rounds  Merton Border, MD  PCCM service Mobile [phone] Pager [phone]

## 2016-03-07 NOTE — Progress Notes (Signed)
[place] for Medication dose adjustment/monitoring Indication: CRRT  Allergies  Allergen Reactions  . Tape Rash    Patient Measurements: Height: 6' (182.9 cm) Weight: 233 lb 11 oz (106 kg) IBW/kg (Calculated) : 77.6   Labs:  Recent Labs  03/05/16 0557  03/06/16 0513  03/06/16 2151 03/06/16 2155 03/07/16 0256 03/07/16 0257 03/07/16 0543  WBC 7.9  --  11.6*  --   --   --   --   --  15.9*  HGB 8.2*  --  8.5*  --   --   --   --   --  9.0*  HCT 23.1*  --  25.5*  --   --   --   --   --  27.1*  PLT 110*  --  81*  --   --   --   --   --  88*  CREATININE 1.01  < > 0.90  0.76  < >  --  0.62  --  0.59* 0.63  0.61  MG  --   < > 1.8  < > 1.8  --  1.8  --  1.7  PHOS 2.8  < > 2.2*  < >  --  2.1*  --  2.2* 2.3*  ALBUMIN 1.3*  < > 1.3*  1.4*  < >  --  1.3*  --  1.3* 1.4*  PROT  --   --  5.6*  --   --   --   --   --   --   AST  --   --  48*  --   --   --   --   --   --   ALT  --   --  28  --   --   --   --   --   --   ALKPHOS  --   --  77  --   --   --   --   --   --   BILITOT  --   --  0.6  --   --   --   --   --   --   < > = values in this interval not displayed. Estimated Creatinine Clearance: 103.5 mL/min (by C-G formula based on Cr of 0.61).   Assessment: Pharmacy consulted to monitor and adjust medication doses per CRRT.   Plan:  Will transition penicillin dose from being in normal saline to dextrose. Will decrease volume requirement of carrier fluid by ~ 648mL/day.   No further adjustments warranted at this time.    Pharmacy will continue to monitor and adjust per consult.   Keasia Dubose L 03/07/2016,8:57 AM

## 2016-03-07 NOTE — Progress Notes (Signed)
Pt remains intubated sedated on propofol 30 mcg. Pt received fentanyl push 25 mcg x1 for facial grimacing during bath. Pt does not follow commands but will w/d from painful stimuli. Pt cont on crrt. Blood flow decreased to 230 at present. CVP this shift 9,10,10.c-diff resulted negative this shift . Further assessment via flowsheet

## 2016-03-07 NOTE — Progress Notes (Signed)
Dr. Mortimer Fries called and notified of patient increased tachypnea.  Nurse update MD about response to fentanyl push.  Order obtained for fentanyl drip.

## 2016-03-07 NOTE — Progress Notes (Signed)
Pt noted to have facial grimacing pt was given fentanyl 72mcg

## 2016-03-08 ENCOUNTER — Inpatient Hospital Stay: Payer: Commercial Managed Care - HMO

## 2016-03-08 DIAGNOSIS — R401 Stupor: Secondary | ICD-10-CM | POA: Insufficient documentation

## 2016-03-08 DIAGNOSIS — I2109 ST elevation (STEMI) myocardial infarction involving other coronary artery of anterior wall: Secondary | ICD-10-CM

## 2016-03-08 DIAGNOSIS — R410 Disorientation, unspecified: Secondary | ICD-10-CM

## 2016-03-08 LAB — CBC
HCT: 25.5 % — ABNORMAL LOW (ref 40.0–52.0)
HEMOGLOBIN: 8.4 g/dL — AB (ref 13.0–18.0)
MCH: 29.9 pg (ref 26.0–34.0)
MCHC: 33 g/dL (ref 32.0–36.0)
MCV: 90.9 fL (ref 80.0–100.0)
Platelets: 85 10*3/uL — ABNORMAL LOW (ref 150–440)
RBC: 2.81 MIL/uL — ABNORMAL LOW (ref 4.40–5.90)
RDW: 14.6 % — AB (ref 11.5–14.5)
WBC: 14.5 10*3/uL — ABNORMAL HIGH (ref 3.8–10.6)

## 2016-03-08 LAB — RENAL FUNCTION PANEL
ALBUMIN: 1.3 g/dL — AB (ref 3.5–5.0)
ALBUMIN: 1.3 g/dL — AB (ref 3.5–5.0)
ALBUMIN: 1.2 g/dL — AB (ref 3.5–5.0)
ANION GAP: 1 — AB (ref 5–15)
ANION GAP: 1 — AB (ref 5–15)
Albumin: 1.2 g/dL — ABNORMAL LOW (ref 3.5–5.0)
Anion gap: 0 — ABNORMAL LOW (ref 5–15)
Anion gap: 1 — ABNORMAL LOW (ref 5–15)
BUN: 27 mg/dL — AB (ref 6–20)
BUN: 26 mg/dL — ABNORMAL HIGH (ref 6–20)
BUN: 27 mg/dL — AB (ref 6–20)
BUN: 26 mg/dL — ABNORMAL HIGH (ref 6–20)
CALCIUM: 7.8 mg/dL — AB (ref 8.9–10.3)
CHLORIDE: 108 mmol/L (ref 101–111)
CO2: 30 mmol/L (ref 22–32)
CO2: 28 mmol/L (ref 22–32)
CO2: 30 mmol/L (ref 22–32)
CO2: 29 mmol/L (ref 22–32)
CREATININE: 0.47 mg/dL — AB (ref 0.61–1.24)
CREATININE: 0.47 mg/dL — AB (ref 0.61–1.24)
Calcium: 8 mg/dL — ABNORMAL LOW (ref 8.9–10.3)
Calcium: 7.9 mg/dL — ABNORMAL LOW (ref 8.9–10.3)
Calcium: 7.9 mg/dL — ABNORMAL LOW (ref 8.9–10.3)
Chloride: 108 mmol/L (ref 101–111)
Chloride: 108 mmol/L (ref 101–111)
Chloride: 108 mmol/L (ref 101–111)
Creatinine, Ser: 0.48 mg/dL — ABNORMAL LOW (ref 0.61–1.24)
Creatinine, Ser: 0.49 mg/dL — ABNORMAL LOW (ref 0.61–1.24)
GFR calc Af Amer: >60 mL/min (ref >60)
GFR calc Af Amer: >60 mL/min (ref >60)
GFR calc non Af Amer: >60 mL/min (ref >60)
GLUCOSE: 113 mg/dL — AB (ref 65–99)
Glucose, Bld: 84 mg/dL (ref 65–99)
Glucose, Bld: 66 mg/dL (ref 65–99)
Glucose, Bld: 104 mg/dL — ABNORMAL HIGH (ref 65–99)
PHOSPHORUS: 1.9 mg/dL — AB (ref 2.5–4.6)
PHOSPHORUS: 1.9 mg/dL — AB (ref 2.5–4.6)
PHOSPHORUS: 2 mg/dL — AB (ref 2.5–4.6)
PHOSPHORUS: 2.2 mg/dL — AB (ref 2.5–4.6)
POTASSIUM: 4 mmol/L (ref 3.5–5.1)
POTASSIUM: 4.3 mmol/L (ref 3.5–5.1)
Potassium: 3.9 mmol/L (ref 3.5–5.1)
Potassium: 4.4 mmol/L (ref 3.5–5.1)
SODIUM: 137 mmol/L (ref 135–145)
SODIUM: 138 mmol/L (ref 135–145)
Sodium: 139 mmol/L (ref 135–145)
Sodium: 138 mmol/L (ref 135–145)

## 2016-03-08 LAB — GLUCOSE, CAPILLARY
GLUCOSE-CAPILLARY: 137 mg/dL — AB (ref 65–99)
GLUCOSE-CAPILLARY: 146 mg/dL — AB (ref 65–99)
GLUCOSE-CAPILLARY: 34 mg/dL — AB (ref 65–99)
GLUCOSE-CAPILLARY: 52 mg/dL — AB (ref 65–99)
GLUCOSE-CAPILLARY: 99 mg/dL (ref 65–99)
Glucose-Capillary: 100 mg/dL — ABNORMAL HIGH (ref 65–99)
Glucose-Capillary: 142 mg/dL — ABNORMAL HIGH (ref 65–99)
Glucose-Capillary: 90 mg/dL (ref 65–99)

## 2016-03-08 LAB — MAGNESIUM
Magnesium: 1.6 mg/dL — ABNORMAL LOW (ref 1.7–2.4)
Magnesium: 1.6 mg/dL — ABNORMAL LOW (ref 1.7–2.4)
Magnesium: 1.6 mg/dL — ABNORMAL LOW (ref 1.7–2.4)

## 2016-03-08 LAB — PROCALCITONIN: PROCALCITONIN: 0.57 ng/mL

## 2016-03-08 MED ORDER — MIDAZOLAM HCL 2 MG/2ML IJ SOLN
1.0000 mg | INTRAMUSCULAR | Status: AC | PRN
  Administered 2016-03-08 – 2016-03-09 (×3): 1 mg via INTRAVENOUS
  Filled 2016-03-08 (×3): qty 2

## 2016-03-08 MED ORDER — FAMOTIDINE 20 MG PO TABS: 20.0000 mg | ORAL_TABLET | ORAL | Status: DC

## 2016-03-08 MED ORDER — PRAVASTATIN SODIUM 20 MG PO TABS
40.0000 mg | ORAL_TABLET | Freq: Every day | ORAL | Status: DC
  Administered 2016-03-09 – 2016-03-11 (×4): 40 mg
  Filled 2016-03-08 (×5): qty 2

## 2016-03-08 MED ORDER — DEXTROSE 50 % IV SOLN
INTRAVENOUS | Status: AC
  Administered 2016-03-08: 25 g via INTRAVENOUS
  Filled 2016-03-08: qty 50

## 2016-03-08 MED ORDER — DEXTROSE 50 % IV SOLN
25.0000 g | Freq: Once | INTRAVENOUS | Status: AC
  Administered 2016-03-08: 25 g via INTRAVENOUS

## 2016-03-08 MED ORDER — DEXTROSE 5 % IV SOLN
0.0000 ug/min | INTRAVENOUS | Status: DC
  Administered 2016-03-08: 30 ug/min via INTRAVENOUS
  Administered 2016-03-10: 20 ug/min via INTRAVENOUS
  Administered 2016-03-10: 32 ug/min via INTRAVENOUS
  Administered 2016-03-11: 17 ug/min via INTRAVENOUS
  Administered 2016-03-12: 20 ug/min via INTRAVENOUS
  Administered 2016-03-13: 10 ug/min via INTRAVENOUS
  Administered 2016-03-15: 8 ug/min via INTRAVENOUS
  Filled 2016-03-08 (×6): qty 4

## 2016-03-08 MED ORDER — ROCURONIUM BROMIDE 50 MG/5ML IV SOLN
INTRAVENOUS | Status: AC
Start: 2016-03-08 — End: 2016-03-08
  Administered 2016-03-08: 50 mg via INTRAVENOUS
  Filled 2016-03-08: qty 1

## 2016-03-08 MED ORDER — PANTOPRAZOLE SODIUM 40 MG IV SOLR
40.0000 mg | INTRAVENOUS | Status: DC
  Administered 2016-03-08: 40 mg via INTRAVENOUS
  Filled 2016-03-08: qty 40

## 2016-03-08 MED ORDER — ALBUTEROL SULFATE (2.5 MG/3ML) 0.083% IN NEBU
2.5000 mg | INHALATION_SOLUTION | RESPIRATORY_TRACT | Status: DC | PRN
  Administered 2016-03-08: 2.5 mg via RESPIRATORY_TRACT

## 2016-03-08 MED ORDER — MIDAZOLAM HCL 2 MG/2ML IJ SOLN
1.0000 mg | INTRAMUSCULAR | Status: DC | PRN
  Administered 2016-03-09 – 2016-03-16 (×11): 1 mg via INTRAVENOUS
  Filled 2016-03-08 (×13): qty 2

## 2016-03-08 MED ORDER — VITAL HIGH PROTEIN PO LIQD
1000.0000 mL | ORAL | Status: DC
  Administered 2016-03-08 – 2016-03-09 (×3): 1000 mL

## 2016-03-08 MED ORDER — ANTISEPTIC ORAL RINSE SOLUTION (CORINZ)
7.0000 mL | Freq: Four times a day (QID) | OROMUCOSAL | Status: DC
  Administered 2016-03-08 – 2016-03-19 (×44): 7 mL via OROMUCOSAL
  Filled 2016-03-08 (×46): qty 7

## 2016-03-08 MED ORDER — INSULIN ASPART 100 UNIT/ML ~~LOC~~ SOLN
0.0000 [IU] | SUBCUTANEOUS | Status: DC
  Administered 2016-03-08 (×2): 2 [IU] via SUBCUTANEOUS
  Administered 2016-03-09 (×2): 3 [IU] via SUBCUTANEOUS
  Administered 2016-03-09 (×4): 2 [IU] via SUBCUTANEOUS
  Administered 2016-03-10: 5 [IU] via SUBCUTANEOUS
  Administered 2016-03-10 (×2): 3 [IU] via SUBCUTANEOUS
  Administered 2016-03-10: 5 [IU] via SUBCUTANEOUS
  Administered 2016-03-10 (×2): 3 [IU] via SUBCUTANEOUS
  Administered 2016-03-11: 5 [IU] via SUBCUTANEOUS
  Administered 2016-03-11: 3 [IU] via SUBCUTANEOUS
  Administered 2016-03-11: 2 [IU] via SUBCUTANEOUS
  Administered 2016-03-11: 3 [IU] via SUBCUTANEOUS
  Administered 2016-03-11: 5 [IU] via SUBCUTANEOUS
  Administered 2016-03-12 (×2): 2 [IU] via SUBCUTANEOUS
  Administered 2016-03-12: 3 [IU] via SUBCUTANEOUS
  Administered 2016-03-12: 2 [IU] via SUBCUTANEOUS
  Administered 2016-03-13 (×2): 3 [IU] via SUBCUTANEOUS
  Administered 2016-03-14 (×2): 2 [IU] via SUBCUTANEOUS
  Administered 2016-03-16: 8 [IU] via SUBCUTANEOUS
  Administered 2016-03-16: 5 [IU] via SUBCUTANEOUS
  Administered 2016-03-16: 3 [IU] via SUBCUTANEOUS
  Administered 2016-03-16: 5 [IU] via SUBCUTANEOUS
  Filled 2016-03-08 (×2): qty 2
  Filled 2016-03-08: qty 3
  Filled 2016-03-08: qty 5
  Filled 2016-03-08 (×2): qty 3
  Filled 2016-03-08: qty 2
  Filled 2016-03-08: qty 5
  Filled 2016-03-08 (×3): qty 2
  Filled 2016-03-08 (×2): qty 3
  Filled 2016-03-08: qty 2
  Filled 2016-03-08: qty 3
  Filled 2016-03-08: qty 2
  Filled 2016-03-08: qty 5
  Filled 2016-03-08 (×2): qty 2
  Filled 2016-03-08: qty 5
  Filled 2016-03-08 (×3): qty 3
  Filled 2016-03-08: qty 5
  Filled 2016-03-08: qty 3
  Filled 2016-03-08: qty 5
  Filled 2016-03-08: qty 2
  Filled 2016-03-08: qty 8
  Filled 2016-03-08: qty 3
  Filled 2016-03-08 (×2): qty 2

## 2016-03-08 MED ORDER — ALBUTEROL SULFATE (2.5 MG/3ML) 0.083% IN NEBU
INHALATION_SOLUTION | RESPIRATORY_TRACT | Status: AC
  Filled 2016-03-08: qty 3

## 2016-03-08 MED ORDER — ROCURONIUM BROMIDE 50 MG/5ML IV SOLN
50.0000 mg | Freq: Once | INTRAVENOUS | Status: AC
  Administered 2016-03-08: 50 mg via INTRAVENOUS

## 2016-03-08 MED ORDER — ASPIRIN 300 MG RE SUPP: 300.0000 mg | Freq: Once | RECTAL | Status: DC

## 2016-03-08 MED ORDER — NALOXONE HCL 0.4 MG/ML IJ SOLN
INTRAMUSCULAR | Status: AC
  Administered 2016-03-08: 0.4 mg
  Filled 2016-03-08: qty 1

## 2016-03-08 MED ORDER — BUDESONIDE 0.25 MG/2ML IN SUSP
0.2500 mg | Freq: Two times a day (BID) | RESPIRATORY_TRACT | Status: DC
  Administered 2016-03-08 – 2016-03-20 (×24): 0.25 mg via RESPIRATORY_TRACT
  Filled 2016-03-08 (×25): qty 2

## 2016-03-08 MED ORDER — FENTANYL CITRATE (PF) 100 MCG/2ML IJ SOLN
50.0000 ug | INTRAMUSCULAR | Status: DC | PRN
  Administered 2016-03-12 – 2016-03-18 (×18): 50 ug via INTRAVENOUS
  Filled 2016-03-08 (×20): qty 2

## 2016-03-08 MED ORDER — ALTEPLASE 2 MG IJ SOLR
2.0000 mg | INTRAMUSCULAR | Status: DC | PRN
  Administered 2016-03-08 – 2016-03-19 (×3): 2 mg
  Filled 2016-03-08: qty 4
  Filled 2016-03-08: qty 2

## 2016-03-08 MED ORDER — ASPIRIN 325 MG PO TABS
325.0000 mg | ORAL_TABLET | Freq: Every day | ORAL | Status: DC
  Administered 2016-03-08 – 2016-03-11 (×4): 325 mg
  Filled 2016-03-08 (×4): qty 1

## 2016-03-08 MED ORDER — CHLORHEXIDINE GLUCONATE 0.12% ORAL RINSE (MEDLINE KIT)
15.0000 mL | Freq: Two times a day (BID) | OROMUCOSAL | Status: DC
  Administered 2016-03-08 – 2016-03-19 (×22): 15 mL via OROMUCOSAL
  Filled 2016-03-08 (×24): qty 15

## 2016-03-08 MED ORDER — FENTANYL CITRATE (PF) 100 MCG/2ML IJ SOLN: 12.5000 ug | INTRAMUSCULAR | Status: DC | PRN

## 2016-03-08 MED ORDER — FENTANYL CITRATE (PF) 100 MCG/2ML IJ SOLN
50.0000 ug | INTRAMUSCULAR | Status: AC | PRN
  Administered 2016-03-09 (×3): 50 ug via INTRAVENOUS
  Filled 2016-03-08 (×3): qty 2

## 2016-03-08 MED ORDER — PRAVASTATIN SODIUM 20 MG PO TABS: 40.0000 mg | ORAL_TABLET | Freq: Every day | ORAL | Status: DC

## 2016-03-08 MED ORDER — ETOMIDATE 2 MG/ML IV SOLN
20.0000 mg | Freq: Once | INTRAVENOUS | Status: AC
  Administered 2016-03-08: 20 mg via INTRAVENOUS

## 2016-03-08 NOTE — Progress Notes (Signed)
Brief Nutrition Note  Consult received for enteral/tube feeding initiation and management. RD already following; pt re-intubated today after extubation this AM  Adult Enteral Nutrition Protocol initiated. Full re-assessment to follow.  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   Labs:   Recent Labs Lab 03/07/16 2259 03/08/16 0244 03/08/16 0630  NA 137 138 138  K 3.9 4.3 4.4  CL 108 108 108  CO2 28 30 29   BUN 26* 27* 26*  CREATININE 0.49* 0.47* 0.47*  CALCIUM 7.8* 7.9* 7.9*  MG 1.6* 1.6* 1.6*  PHOS 1.9* 2.0* 2.2*  GLUCOSE 66 104* 113*    [address] MS, RD, LDN [phone] Pager  [phone] Weekend/On-Call Pager

## 2016-03-08 NOTE — Progress Notes (Signed)
Hiral RN from third shift states that CRRT clotted this AM. She was in the process of setting machine up. Unable to restart CRRT due to high venous pressures, error with cycler that requires maintenance, and then once new machine obtained it was due to no blood return from venous access line but flushes. Per Dr Candiss Norse he would like to start HD with patients stable BP and to discontinue CRRT. He will contact the dialysis nurse and inform her,

## 2016-03-08 NOTE — Consult Note (Signed)
..   John Schultz, John Schultz 28-Dec-1940 John Lipps, MD  Reason for Consult: Respiratory failure with need for long term ventilation  HPI: 75 y.o. Male admitted for respiratory issues developed sepsis, respiratory failure, NSTEMI and has failed extubation. Not progressing and now needs long term ventilation.  Consulted for evaluation for tracheostomy given intubated for 10 days.  Allergies:  Allergies  Allergen Reactions  . Tape Rash    ROS: Review of systems normal other than 12 systems except per HPI.  PMH:  Past Medical History  Diagnosis Date  . CHF (congestive heart failure) ([place])   . DM (diabetes mellitus) ([place])   . Pancreatic cyst   . Skin cancer   . DDD (degenerative disc disease), thoracic   . Renal failure   . Respiratory failure ([facility])     FH:  Family History  Problem Relation Age of Onset  . CAD Mother   . Hypertension Mother   . Hypertension Father   . Prostate cancer Father   . Colon cancer Brother     SH:  Social History   Social History  . Marital Status: Married    Spouse Name: N/A  . Number of Children: N/A  . Years of Education: N/A   Occupational History  . Not on file.   Social History Main Topics  . Smoking status: Never Smoker   . Smokeless tobacco: Never Used  . Alcohol Use: No  . Drug Use: No  . Sexual Activity: Not on file   Other Topics Concern  . Not on file   Social History Narrative    PSH:  Past Surgical History  Procedure Laterality Date  . Aortoiliac bypass    . Cholecystectomy    . Kyphoplasty      Physical  Exam:  GEN-  Sedated and intubated EARS- external ears clear OC/OP- ETT in place and secured NECK- thyroid landmarks palpated without masses or lesions  A/P: Respiratory failure with need for long term ventilation  Plan:  Plan for tracheostomy tube on Monday 03/02/2016 at 12:30 p.m.  Attempted to call contact number for wife [phone] with no answer.  Order placed for tracheostomy consent.  Please hold  anticoagulation and NGT at 11:59p.m 03/11/2016.    John Schultz 03/08/2016 4:54 PM

## 2016-03-08 NOTE — Progress Notes (Signed)
[facility] for Medication dose adjustment/monitoring Indication: CRRT  Allergies  Allergen Reactions  . Tape Rash    Patient Measurements: Height: 6' (182.9 cm) Weight: 229 lb 4.5 oz (104 kg) IBW/kg (Calculated) : 77.6   Labs:  Recent Labs  03/06/16 0513  03/07/16 0543  03/07/16 2259 03/08/16 0244 03/08/16 0630  WBC 11.6*  --  15.9*  --   --   --  14.5*  HGB 8.5*  --  9.0*  --   --   --  8.4*  HCT 25.5*  --  27.1*  --   --   --  25.5*  PLT 81*  --  88*  --   --   --  85*  CREATININE 0.90  0.76  < > 0.63  0.61  < > 0.49* 0.47* 0.47*  MG 1.8  < > 1.7  < > 1.6* 1.6* 1.6*  PHOS 2.2*  < > 2.3*  < > 1.9* 2.0* 2.2*  ALBUMIN 1.3*  1.4*  < > 1.4*  < > 1.3* 1.2* 1.2*  PROT 5.6*  --   --   --   --   --   --   AST 48*  --   --   --   --   --   --   ALT 28  --   --   --   --   --   --   ALKPHOS 77  --   --   --   --   --   --   BILITOT 0.6  --   --   --   --   --   --   < > = values in this interval not displayed. Estimated Creatinine Clearance: 102.6 mL/min (by C-G formula based on Cr of 0.47).   Assessment: Pharmacy consulted to monitor and adjust medication doses per CRRT.   Plan:  Will change famotidine dosing to q 48 hours.   No further adjustments warranted at this time.    Pharmacy will continue to monitor and adjust per consult.   Ulice Dash D 03/08/2016,8:23 AM

## 2016-03-08 NOTE — Progress Notes (Signed)
Inpatient Diabetes Program Recommendations  AACE/ADA: New Consensus Statement on Inpatient Glycemic Control (2015)  Target Ranges:  Prepandial:   less than 140 mg/dL      Peak postprandial:   less than 180 mg/dL (1-2 hours)      Critically ill patients:  140 - 180 mg/dL   Review of Glycemic Control  Results for FELICE, COLLINS (MRN [id number]) as of 03/08/2016 11:06  Ref. Range 03/08/2016 00:05 03/08/2016 00:07 03/08/2016 00:53 03/08/2016 03:44 03/08/2016 07:58  Glucose-Capillary Latest Ref Range: 65-99 mg/dL 34 (LL) 52 (L) 100 (H) 90 99    Diabetes history: DM2 Outpatient Diabetes medications: Lantus 15 units QPM, Novolog 5 units TID with meals, Metformin 1500 mg QPM Current orders for Inpatient glycemic control: none  Inpatient Diabetes Program Recommendations: Low blood sugar likely as a result of Lantus being increased and steroids stopped at around the same time.   May want to consider putting the patient on the ICU Glycemic control order set phase 1 with sensitive correction.  If tube feeds are re-started, consider low dose basal insulin - Lantus 10 units qhs (0.1unit/kg)  Gentry Fitz, RN, [place], [place], CDE Diabetes Coordinator Inpatient Diabetes Program  [phone] (Team Pager) [phone] ([place]) 03/08/2016 11:33 AM

## 2016-03-08 NOTE — Progress Notes (Signed)
Tx ended early

## 2016-03-08 NOTE — Progress Notes (Signed)
Subjective:  Patient seen at bedside. Dialyzer clotted and machine problems New machine and dialyzer this AM UOP remains poor    Objective:  Vital signs in last 24 hours:  Temp:  [97.8 F (36.6 C)-98.5 F (36.9 C)] 98.4 F (36.9 C) (04/13 0400) Pulse Rate:  [84-102] 86 (04/13 0700) Resp:  [21-32] 23 (04/13 0700) BP: (90-138)/(42-81) 116/49 mmHg (04/13 0700) SpO2:  [96 %-100 %] 98 % (04/13 0706) FiO2 (%):  [28 %] 28 % (04/13 0708) Weight:  [104 kg (229 lb 4.5 oz)] 104 kg (229 lb 4.5 oz) (04/13 0500)  Weight change: -2 kg (-4 lb 6.6 oz) Filed Weights   03/06/16 0400 03/07/16 0500 03/08/16 0500  Weight: 106 kg (233 lb 11 oz) 106 kg (233 lb 11 oz) 104 kg (229 lb 4.5 oz)    Intake/Output:    Intake/Output Summary (Last 24 hours) at 03/08/16 1011 Last data filed at 03/08/16 0700  Gross per 24 hour  Intake 2205.34 ml  Output   2891 ml  Net -685.66 ml     Physical Exam: General: Critically ill appearing, lethargic  HEENT ETT  Neck supple  Pulm/lungs Vent assisted bilateral rhonchi  CVS/Heart Regular, soft systolic murmur  Abdomen:  Soft, non distended, BS present  Extremities: ++ peripheral edema,    Neurologic: Sedated,  Not following commands  Skin: Heel skin breakdown  GU: Foley       Basic Metabolic Panel:   Recent Labs Lab 03/07/16 1417 03/07/16 1804 03/07/16 2259 03/08/16 0244 03/08/16 0630  NA 138 139 137 138 138  K 3.9 4.0 3.9 4.3 4.4  CL 108 108 108 108 108  CO2 29 30 28 30 29   GLUCOSE 140* 84 66 104* 113*  BUN 27* 27* 26* 27* 26*  CREATININE 0.53* 0.48* 0.49* 0.47* 0.47*  CALCIUM 7.9* 8.0* 7.8* 7.9* 7.9*  MG 1.7 1.6* 1.6* 1.6* 1.6*  PHOS 1.9* 1.9* 1.9* 2.0* 2.2*     CBC:  Recent Labs Lab 03/04/16 0530 03/05/16 0557 03/06/16 0513 03/07/16 0543 03/08/16 0630  WBC 11.3* 7.9 11.6* 15.9* 14.5*  HGB 7.4* 8.2* 8.5* 9.0* 8.4*  HCT 21.3* 23.1* 25.5* 27.1* 25.5*  MCV 87.4 87.9 89.7 89.7 90.9  PLT 130* 110* 81* 88* 85*       Microbiology:  Recent Results (from the past 720 hour(s))  Blood culture (routine x 2)     Status: Abnormal   Collection Time: 02/25/2016 12:59 PM  Result Value Ref Range Status   Specimen Description BLOOD LEFT FOREARM  Final   Special Requests BOTTLES DRAWN AEROBIC AND ANAEROBIC  1CC  Final   Culture  Setup Time   Final    GRAM POSITIVE COCCI AEROBIC BOTTLE ONLY CRITICAL VALUE NOTED.  VALUE IS CONSISTENT WITH PREVIOUSLY REPORTED AND CALLED VALUE.    Culture STREPTOCOCCUS PYOGENES AEROBIC BOTTLE ONLY  (A)  Final   Report Status 03/03/2016 FINAL  Final   Organism ID, Bacteria STREPTOCOCCUS PYOGENES  Final      Susceptibility   Streptococcus pyogenes - MIC*    CLINDAMYCIN Value in next row Sensitive      SENSITIVE0.25    AMPICILLIN Value in next row Sensitive      SENSITIVE0.25    ERYTHROMYCIN Value in next row Sensitive      SENSITIVE0.12    VANCOMYCIN Value in next row Sensitive      SENSITIVE0.5    CEFTRIAXONE Value in next row Sensitive      SENSITIVE0.12    LEVOFLOXACIN  Value in next row Sensitive      SENSITIVE0.5    * STREPTOCOCCUS PYOGENES  Rapid Influenza A&B Antigens ([facility] only)     Status: None   Collection Time: 03/12/2016  1:00 PM  Result Value Ref Range Status   Influenza A ([place]) NEGATIVE NEGATIVE Final   Influenza B ([facility]) NEGATIVE NEGATIVE Final  Blood culture (routine x 2)     Status: None   Collection Time: 03/08/2016  1:40 PM  Result Value Ref Range Status   Specimen Description BLOOD LEFT FOREARM  Final   Special Requests BOTTLES DRAWN AEROBIC AND ANAEROBIC  4CC  Final   Culture  Setup Time   Final    GRAM POSITIVE COCCI IN BOTH AEROBIC AND ANAEROBIC BOTTLES CRITICAL RESULT CALLED TO, READ BACK BY AND VERIFIED WITH: Ar[place] AT [id number] 02/27/16 CAF    Culture   Final    STREPTOCOCCUS PYOGENES IN BOTH AEROBIC AND ANAEROBIC BOTTLES    Report Status 02/29/2016 FINAL  Final   Organism ID, Bacteria STREPTOCOCCUS PYOGENES  Final       Susceptibility   Streptococcus pyogenes - MIC*    CLINDAMYCIN Value in next row Sensitive      SENSITIVE<=0.25    AMPICILLIN Value in next row Sensitive      SENSITIVE<=0.25    ERYTHROMYCIN Value in next row Sensitive      SENSITIVE0.12    VANCOMYCIN Value in next row Sensitive      SENSITIVE0.5    CEFTRIAXONE Value in next row Sensitive      SENSITIVE0.12    LEVOFLOXACIN Value in next row Sensitive      SENSITIVE0.5    * STREPTOCOCCUS PYOGENES  Blood Culture ID Panel (Reflexed)     Status: Abnormal   Collection Time: 03/19/2016  1:40 PM  Result Value Ref Range Status   Enterococcus species NOT DETECTED NOT DETECTED Final   Vancomycin resistance NOT DETECTED NOT DETECTED Final   Listeria monocytogenes NOT DETECTED NOT DETECTED Final   Staphylococcus species NOT DETECTED NOT DETECTED Final   Staphylococcus aureus NOT DETECTED NOT DETECTED Final   Methicillin resistance NOT DETECTED NOT DETECTED Final   Streptococcus species NOT DETECTED NOT DETECTED Final   Streptococcus agalactiae NOT DETECTED NOT DETECTED Final   Streptococcus pneumoniae NOT DETECTED NOT DETECTED Final   Streptococcus pyogenes DETECTED (A) NOT DETECTED Final    Comment: CRITICAL RESULT CALLED TO, READ BACK BY AND VERIFIED WITH: NATE COOKSON AT 0426 02/27/16 CAF    Acinetobacter baumannii NOT DETECTED NOT DETECTED Final   Enterobacteriaceae species NOT DETECTED NOT DETECTED Final   Enterobacter cloacae complex NOT DETECTED NOT DETECTED Final   Escherichia coli NOT DETECTED NOT DETECTED Final   Klebsiella oxytoca NOT DETECTED NOT DETECTED Final   Klebsiella pneumoniae NOT DETECTED NOT DETECTED Final   Proteus species NOT DETECTED NOT DETECTED Final   Serratia marcescens NOT DETECTED NOT DETECTED Final   Carbapenem resistance NOT DETECTED NOT DETECTED Final   Haemophilus influenzae NOT DETECTED NOT DETECTED Final   Neisseria meningitidis NOT DETECTED NOT DETECTED Final   Pseudomonas aeruginosa NOT DETECTED NOT  DETECTED Final   Candida albicans NOT DETECTED NOT DETECTED Final   Candida glabrata NOT DETECTED NOT DETECTED Final   Candida krusei NOT DETECTED NOT DETECTED Final   Candida parapsilosis NOT DETECTED NOT DETECTED Final   Candida tropicalis NOT DETECTED NOT DETECTED Final  Body fluid culture     Status: None   Collection Time: 03/08/2016  3:05 PM  Result Value Ref Range Status   Specimen Description R Knee  Final   Special Requests NONE  Final   Gram Stain   Final    MODERATE WBC SEEN MODERATE GRAM POSITIVE COCCI IN PAIRS AND CHAINS    Culture   Final    HEAVY GROWTH GROUP A STREP (S.PYOGENES) ISOLATED There is no known Penicillin Resistant Beta Streptococcus in the [place] For patients that are Penicillin-allergic, Erythromycin is 85-94% susceptible, and Clindamycin is 80% susceptible.  Contact Microbiology within 7 days if sensitivity testing is  required.   NO ANAEROBES ISOLATED    Report Status 02/29/2016 FINAL  Final  Body fluid culture     Status: None   Collection Time: 02/25/2016  4:10 PM  Result Value Ref Range Status   Specimen Description R Knee  Final   Special Requests NONE  Final   Gram Stain   Final    MANY WBC SEEN MANY GRAM POSITIVE COCCI IN PAIRS AND CHAINS    Culture   Final    HEAVY GROWTH GROUP A STREP (S.PYOGENES) ISOLATED There is no known Penicillin Resistant Beta Streptococcus in the [place] For patients that are Penicillin-allergic, Erythromycin is 85-94% susceptible, and Clindamycin is 80% susceptible.  Contact Microbiology within 7 days if sensitivity testing is  required.   NO ANAEROBES ISOLATED    Report Status 02/29/2016 FINAL  Final  MRSA PCR Screening     Status: None   Collection Time: 02/27/16  3:35 AM  Result Value Ref Range Status   MRSA by PCR NEGATIVE NEGATIVE Final    Comment:        The GeneXpert MRSA Assay (FDA approved for NASAL specimens only), is one component of a comprehensive MRSA colonization surveillance program. It is  not intended to diagnose MRSA infection nor to guide or monitor treatment for MRSA infections.   Urine culture     Status: None   Collection Time: 02/27/16  8:30 AM  Result Value Ref Range Status   Specimen Description URINE, RANDOM  Final   Special Requests NONE  Final   Culture NO GROWTH 1 DAY  Final   Report Status 02/28/2016 FINAL  Final  Culture, blood (single) w Reflex to PCR ID Panel     Status: None   Collection Time: 02/29/16  2:36 PM  Result Value Ref Range Status   Specimen Description BLOOD LEFT HAND  Final   Special Requests BOTTLES DRAWN AEROBIC AND ANAEROBIC Page  Final   Culture NO GROWTH 5 DAYS  Final   Report Status 03/05/2016 FINAL  Final  C difficile quick scan w PCR reflex     Status: None   Collection Time: 03/06/16  5:37 PM  Result Value Ref Range Status   C Diff antigen NEGATIVE NEGATIVE Final   C Diff toxin NEGATIVE NEGATIVE Final   C Diff interpretation Negative for C. difficile  Final  Culture, respiratory (NON-Expectorated)     Status: None (Preliminary result)   Collection Time: 03/07/16  8:20 AM  Result Value Ref Range Status   Specimen Description TRACHEAL ASPIRATE  Final   Special Requests NONE  Final   Gram Stain   Final    MODERATE WBC SEEN FEW SQUAMOUS EPITHELIAL CELLS PRESENT RARE YEAST    Culture HOLDING FOR POSSIBLE PATHOGEN  Final   Report Status PENDING  Incomplete    Coagulation Studies: No results for input(s): LABPROT, INR in the last 72 hours.  Urinalysis: No results for input(s): COLORURINE, LABSPEC, [facility],  GLUCOSEU, HGBUR, BILIRUBINUR, KETONESUR, PROTEINUR, UROBILINOGEN, NITRITE, LEUKOCYTESUR in the last 72 hours.  Invalid input(s): APPERANCEUR    Imaging: Dg Chest Port 1 View  03/08/2016  CLINICAL DATA:  Respiratory failure, acute renal failure, NSTEMI, septic shock. EXAM: PORTABLE CHEST 1 VIEW COMPARISON:  Portable chest x-ray of March 07, 2016 FINDINGS: The lung volumes remain low. Bibasilar densities  persists with obscuration of the hemidiaphragm. The heart is top-normal in size. The central pulmonary vascularity is mildly prominent. The internal jugular venous catheter on the right projects over the proximal SVC. The endotracheal tube tip lies 3.2 cm above the carina. The esophagogastric tube tip in proximal port are "Within a hiatal hernia or partially intra thoracic stomach. The patient has undergone previous CABG. IMPRESSION: Stable appearance of the chest. Bibasilar atelectasis or pneumonia with small bilateral pleural effusions. Mild pulmonary interstitial edema, improved. Electronically Signed   By: David  Martinique M.D.   On: 03/08/2016 07:30   Dg Chest Port 1 View  03/07/2016  CLINICAL DATA:  Respiratory failure, acute renal failure, NSTEMI, septic shock. EXAM: PORTABLE CHEST 1 VIEW COMPARISON:  Portable chest x-ray of March 06, 2016 FINDINGS: The lungs remain hypoinflated. There are persistent bilateral pleural effusions. Bibasilar atelectasis or pneumonia is present. The cardiac silhouette is mildly enlarged. The central pulmonary vascularity remains engorged. The patient has undergone previous CABG. The endotracheal tube tip lies approximately 2 point 9 cm above the carina. The right internal jugular venous catheter tip projects over the proximal SVC. The esophagogastric tube tip projects in the region of the gastric cardia with portions of the tube coiled in the gastric fundus. IMPRESSION: Slight interval improvement in bibasilar atelectasis. Persistent hypoinflation with small bilateral pleural effusions. Mild central pulmonary vascular congestion. Electronically Signed   By: David  Martinique M.D.   On: 03/07/2016 07:14     Medications:   . pureflow 2,500 mL/hr at 03/08/16 0735   . aspirin  300 mg Rectal Once  . budesonide (PULMICORT) nebulizer solution  0.25 mg Nebulization BID  . ipratropium-albuterol  3 mL Nebulization Q6H  . pencillin G potassium IV  3 Million Units Intravenous 4 times  per day  . pravastatin  40 mg Oral q1800  . sodium chloride flush  3 mL Intravenous Q12H   acetaminophen **OR** [DISCONTINUED] acetaminophen, fentaNYL (SUBLIMAZE) injection, heparin, ondansetron **OR** ondansetron (ZOFRAN) IV, sodium chloride flush  Assessment/ Plan:  75 y.o. Caucasian male  with significant medical history of coronary disease with four-vessel CABG in 1998, gallbladder rupture, extensive surgery, Diabetes with complications of retinopathy, peripheral neuropathy, peripheral vascular disease, Aorto iliac bypass, angioplasty and stent in his left leg, kyphoplasty for chronic back pain, CKD st 3 with Baseline Cr 1.5/ GFR 46  1. 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 Rt Knee septic arthritis 3. NSTEMI 4. DM-2 with CKD 5. Hypotension 6. Acute resp faliure. Intubated 4/3 7. Hypokalemia 8. Hyponatremia 9. Septic shock. 10. Metabolic Acidosis. 11. Generalized edema  Plan: Urine output remains low. However overall the patient has significant volume on board. We will continue to perform continuous renal replacement therapy with ultrafiltration rate to 125 cc per hour. Continue to monitor serum electrolytes. We remain hopeful that his renal function will slowly improve over time. Continue ventilatory support under the instruction of pulmonary critical care. - IF problems with CRRT and BP is good, may try IHD   LOS: 11 Senaya Dicenso 4/13/201710:11 AM

## 2016-03-08 NOTE — Progress Notes (Signed)
PULMONARY / CRITICAL CARE MEDICINE   Name: John Schultz MRN: [id number] DOB: 07/30/1941    ADMISSION DATE:  03/08/2016  BRIEF HISTORY: 75 year old male past medical history of hypertension, chronic diastolic heart failure, CK, diabetes, presented on April 2 with four-day history of progressive right knee pain fever, shortness of breath, cough. Within 24 hours decompensated requiring intubation on 04/03.  Seen by orthopedics, had drain placed in right knee with purulent material. Now with mechanical ventilation and slowly been weaned off.   SUBJECTIVE:  No acute issues overnight. Remains on full vent support. CRRT initiated last night. Failed SBT 04/09 due to respiratory muscle fatique and agitation.  Positive Hemoccult 04/08; H/H stable at 8.2/23.1 post transfusion of 1 unit of PRBCs on 04/09.  SIGNIFICANT EVENTS/STUDIES: 04/02 admitted by Hospitalist Service with 4 day history of right knee pain - swollen, erythematous and tender on exam - and severe sepsis, AKI, abnormal EKG. Cardiac markers positive (peak troponin I 8.10) 04/03 Orthopedics consultation and aspiration of R Knee, with drain placement 04/03 ID consultation 04/03 Nephrology consultation 04/04 TTE: LVEF 60-65%, no vegetations 04/04 altered mental status, worsening respiratory status, septic, intubated 04/04 R knee hemovac removed by ortho 04/06 Cardiology consultation: NSTEMI - deemed secondary to demand ischemia. Further W/U to be considered after resolution of severe sepsis and critical illness 04/09 CRRT initiated  04/10 agitated on WUA. Failed SBT. Off and on norepienphrine 04/11 Tolerating volume removal by CRRT. Failed SBT. Tolerated PS 10-14 cm H2O 04/12 Failed SBT. Agitated on WUA. Uo improving. Started back on fentanyl gtt in PM 04/13 LE venous US:  04/13 Passed SBT. Cognition remains marginal. Extubated. Post extubation stridor and poor cough. BiPAP initiated. High risk of re-intubation  INDWELLING  DEVICES:: R IJ CVL 04/03 >>  ETT 04/03 >>  R femoral HD cath 04/09 >>   MICRO DATA: 04/02 MRSA PCR >> NEG 04/02 flu screen >> NEG 04/02 blood >> Strep pyogenes 04/02 knee aspirate >> Strep pyogenes 04/05 blood >> NEG 04/12 Resp >>  04/12 PCT protocol:   ANTIMICROBIALS:  Ceftriaxone 04/02 X 1  Vanc 04/02 >> 04/03 Pip-tazo 04/02 >> 04/03 Clinda 04/03 >> 04/07 Pen G 04/03 >>   PCT algorithm 04/03: 22.50 > 16.65 > 7.12 PCT algorithm 04/12: 0.62 > 0.57 >   VITAL SIGNS: Temp:  [97.8 F (36.6 C)-98.5 F (36.9 C)] 97.9 F (36.6 C) (04/13 1330) Pulse Rate:  [80-120] 115 (04/13 1440) Resp:  [21-34] 31 (04/13 1440) BP: (82-152)/(42-74) 82/51 mmHg (04/13 1440) SpO2:  [96 %-100 %] 100 % (04/13 1440) FiO2 (%):  [28 %] 28 % (04/13 0708) Weight:  [104 kg (229 lb 4.5 oz)] 104 kg (229 lb 4.5 oz) (04/13 1330) HEMODYNAMICS:   VENTILATOR SETTINGS: Vent Mode:  [-] PRVC FiO2 (%):  [28 %] 28 % Set Rate:  [15 bmp] 15 bmp Vt Set:  [550 mL] 550 mL PEEP:  [5 cmH20] 5 cmH20 Plateau Pressure:  [19 cmH20] 19 cmH20 INTAKE / OUTPUT:  Intake/Output Summary (Last 24 hours) at 03/08/16 1448 Last data filed at 03/08/16 0700  Gross per 24 hour  Intake 1620.5 ml  Output   2512 ml  Net -891.5 ml    Review of Systems  Unable to perform ROS: intubated    Physical Exam  Constitutional:  RASS -3, not F/C  HENT:  Head: Normocephalic and atraumatic.  Eyes: EOM are normal. Pupils are equal, round, and reactive to light.  Cardiovascular: Regular rhythm and normal heart sounds.   No  murmur heard. Tachy  Pulmonary/Chest: No respiratory distress. He has no wheezes. He has no rales.  Abdominal: Soft. Bowel sounds are normal. He exhibits no distension. There is no tenderness.  Musculoskeletal:  R>L LE edema  Neurological: He has normal reflexes.  Moves all extremities     LABS:  CBC  Recent Labs Lab 03/06/16 0513 03/07/16 0543 03/08/16 0630  WBC 11.6* 15.9* 14.5*  HGB 8.5* 9.0* 8.4*   HCT 25.5* 27.1* 25.5*  PLT 81* 88* 85*   Coag's No results for input(s): APTT, INR in the last 168 hours. BMET  Recent Labs Lab 03/07/16 2259 03/08/16 0244 03/08/16 0630  NA 137 138 138  K 3.9 4.3 4.4  CL 108 108 108  CO2 28 30 29   BUN 26* 27* 26*  CREATININE 0.49* 0.47* 0.47*  GLUCOSE 66 104* 113*   Electrolytes  Recent Labs Lab 03/07/16 2259 03/08/16 0244 03/08/16 0630  CALCIUM 7.8* 7.9* 7.9*  MG 1.6* 1.6* 1.6*  PHOS 1.9* 2.0* 2.2*   Sepsis Markers  Recent Labs Lab 03/02/16 0740 03/02/16 0812 03/07/16 0922 03/08/16 0630  LATICACIDVEN  --  1.3  --   --   PROCALCITON 7.12  --  0.62 0.57   ABG  Recent Labs Lab 03/02/16 0921 03/03/16 1053  PHART 7.26* 7.47*  PCO2ART 34 31*  PO2ART 90 80*   Liver Enzymes  Recent Labs Lab 03/06/16 0513  03/07/16 2259 03/08/16 0244 03/08/16 0630  AST 48*  --   --   --   --   ALT 28  --   --   --   --   ALKPHOS 77  --   --   --   --   BILITOT 0.6  --   --   --   --   ALBUMIN 1.3*  1.4*  < > 1.3* 1.2* 1.2*  < > = values in this interval not displayed. Cardiac Enzymes No results for input(s): TROPONINI, PROBNP in the last 168 hours. Glucose  Recent Labs Lab 03/08/16 0005 03/08/16 0007 03/08/16 0053 03/08/16 0344 03/08/16 0758 03/08/16 1441  GLUCAP 34* 52* 100* 90 99 137*    CXR: slightly improved aeration. Edema pattern persists   ASSESSMENT / PLAN:  PULMONARY Acute vent dep respiratory failure Pulm edema pattern on CXR High risk of re-intubation P: Monitor closely in ICU post extubation PRN BiPAP Supplemental O2 Cont nebulized steroids and bronchodilators If re-intubated, he will need trach tube placement  CARDIOVASCULAR Septic shock, resolved NSTEMI P:  MAP goal > 60 mmHg Cont ASA and statin Further cardiac w/u after critical illness resolves  RENAL AKI Hypervolemia P: Monitor BMET intermittently Monitor I/Os Correct electrolytes as indicated Renal Service following Cont  IHD  GASTROINTESTINAL Diarrhea  Hemoccult positive Dysphagia post extubation P: SUP: enteral famotidine NPO until cognition improves Will need SLP eval eventually  HEMATOLOGIC Anemia without overt bleeding Thrombocytopenia  P: DVT px: SCDs Monitor CBC intermittently Transfuse per usual guidelines SQ heparin DC'd 04/12  INFECTIOUS Septic arthritis of the R knee Frothy purulent ET secretions P: Monitor temp, WBC count Micro and abx as above  ENDOCRINE DM 2 Hyperglycemia improved P: DC Lantus until diet resumed Cont SSI  - will need to change to ACHS if diet initiated  NEUROLOGIC ICU acquired delirium - hypoactive P: RASS goal 0 DC all sedating medications   Disposition and family update: Wife updated @ bedside. She understands that he is very tenuous after extubation and will need trach if re-intubated  CCM time: 45 mins The above time includes time spent in consultation with patient and/or family members and reviewing care plan on multidisciplinary rounds  Merton Border, MD PCCM service Mobile [phone] Pager [phone]

## 2016-03-08 NOTE — Significant Event (Signed)
Pt remains very lethargic, poorly responsive with poor cough and excessive work of breathing. Will need re-intubation. Will also obtain CT head after intubation. Have placed order for ENT consultation for trach tube placement  Merton Border, MD PCCM service Mobile [phone] Pager [phone] 03/08/2016

## 2016-03-08 NOTE — Progress Notes (Signed)
Patient extubated 10:35am. Patients heart rate in the 120's, labored breathing. Orders given for Narcan. Narcan has been given and patient continues to not follow any simple commands with the labored breathing. Patients wife is at bedside. Awaiting for Dr Rogue Jury.

## 2016-03-08 NOTE — Progress Notes (Signed)
Nutrition Follow-up   INTERVENTION:   -Await diet progression post extubation. Recommend addition of nutritional supplement once diet advanced  NUTRITION DIAGNOSIS:   Inadequate oral intake related to acute illness as evidenced by NPO status.  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, respiratory distress with severe acidosis requiring intubation on 02/27/16  Pt s/p extubation this AM, CRRT continues, UOP remains poor  Diet Order:  Diet NPO time specified  Skin:   (stage I pressure ulcer on buttock, stage II on sacrum)  Last BM:  03/07/16 flexiseal   Labs: magnesium 1.6, phosphorus 2.2  Meds: reviewed  Height:   Ht Readings from Last 1 Encounters:  02/28/16 6' (1.829 m)    Weight:   Wt Readings from Last 1 Encounters:  03/08/16 229 lb 4.5 oz (104 kg)   Filed Weights   03/06/16 0400 03/07/16 0500 03/08/16 0500  Weight: 233 lb 11 oz (106 kg) 233 lb 11 oz (106 kg) 229 lb 4.5 oz (104 kg)    BMI:  Body mass index is 31.09 kg/(m^2).  Estimated Nutritional Needs:   Kcal:  1872 kcals (Ve: 12, tmax: 36.6) using wt of 88.8 kg  Protein:  132-176 g (1.5-2.0 g/kg)   Fluid:  >2 Liters  EDUCATION NEEDS:   Education needs no appropriate at this time  [place], [place], [place] [phone] Pager  [phone] Weekend/On-Call Pager

## 2016-03-08 NOTE — Progress Notes (Signed)
Pre-hd tx 

## 2016-03-08 NOTE — Progress Notes (Addendum)
This note also relates to the following rows which could not be included: Pulse Rate - Cannot attach notes to unvalidated device data Resp - Cannot attach notes to unvalidated device data BP - Cannot attach notes to unvalidated device data SpO2 - Cannot attach notes to unvalidated device data   

## 2016-03-08 NOTE — Progress Notes (Signed)
[facility] PROGRESS NOTE Date of Admission:  03/15/2016     ID: John Schultz is a 75 y.o. male with  Grp A strep septic R knee and bacteremia  Principal Problem:   Septic arthritis of knee, right (HCC) Active Problems:   ARF (acute renal failure) (HCC)   Acute bronchitis   Hyponatremia   NSTEMI (non-ST elevated myocardial infarction) (HCC)   Pressure ulcer   Septic shock (HCC)   Acute respiratory failure (HCC)   Subjective: Extubated today - not doing well though  HIs increased secretions are improving some   ROS  lethargic  Medications:  Antibiotics Given (last 72 hours)    Date/Time Action Medication Dose Rate   03/07/16 1231 Given   penicillin G potassium 3 Million Units in dextrose 5 % 100 mL IVPB 3 Million Units 200 mL/hr   03/07/16 1759 Given   penicillin G potassium 3 Million Units in dextrose 5 % 100 mL IVPB 3 Million Units 200 mL/hr   03/08/16 0000 Given   penicillin G potassium 3 Million Units in dextrose 5 % 100 mL IVPB 3 Million Units 200 mL/hr   03/08/16 0600 Given   penicillin G potassium 3 Million Units in dextrose 5 % 100 mL IVPB 3 Million Units 200 mL/hr   03/08/16 1430 Given  [Pharm delay]   penicillin G potassium 3 Million Units in dextrose 5 % 100 mL IVPB 3 Million Units 200 mL/hr     . albuterol      . aspirin  300 mg Rectal Once  . budesonide (PULMICORT) nebulizer solution  0.25 mg Nebulization BID  . ipratropium-albuterol  3 mL Nebulization Q6H  . pencillin G potassium IV  3 Million Units Intravenous 4 times per day  . pravastatin  40 mg Oral q1800  . sodium chloride flush  3 mL Intravenous Q12H    Objective: Vital signs in last 24 hours: Temp:  [97.8 F (36.6 C)-98.5 F (36.9 C)] 98.3 F (36.8 C) (04/13 0700) Pulse Rate:  [80-120] 117 (04/13 1424) Resp:  [21-32] 28 (04/13 1424) BP: (85-152)/(42-74) 85/54 mmHg (04/13 1424) SpO2:  [96 %-100 %] 100 % (04/13 1424) FiO2 (%):  [28 %] 28 % (04/13 0708) Weight:   [104 kg (229 lb 4.5 oz)] 104 kg (229 lb 4.5 oz) (04/13 1330) Constitutional: lethargic, critically ill appearing.  HENT: perrla, eomi Mouth/Throat: Oropharynx - etet in place  Cardiovascular: tachy, reg Pulmonary/Chest bil rhonchi Abdominal: Soft. Bowel sounds are normal. He exhibits no distension. There is no tenderness.  Lymphadenopathy: He has no cervical adenopathy.  Neurological:lethargic   Skin: Skin is warm and dry. No rash noted. No erythema.  Psychiatric: sedated EXT edematous Ext - L knee with some pain with movement, mild warmth  Lab Results  Recent Labs  03/07/16 0543  03/08/16 0244 03/08/16 0630  WBC 15.9*  --   --  14.5*  HGB 9.0*  --   --  8.4*  HCT 27.1*  --   --  25.5*  NA 138  137  < > 138 138  K 4.4  4.4  < > 4.3 4.4  CL 106  106  < > 108 108  CO2 27  27  < > 30 29  BUN 28*  28*  < > 27* 26*  CREATININE 0.63  0.61  < > 0.47* 0.47*  < > = values in this interval not displayed.  Microbiology: Results for orders placed or performed during the hospital encounter of  03/22/2016  Blood culture (routine x 2)     Status: Abnormal   Collection Time: 03/16/2016 12:59 PM  Result Value Ref Range Status   Specimen Description BLOOD LEFT FOREARM  Final   Special Requests BOTTLES DRAWN AEROBIC AND ANAEROBIC  1CC  Final   Culture  Setup Time   Final    GRAM POSITIVE COCCI AEROBIC BOTTLE ONLY CRITICAL VALUE NOTED.  VALUE IS CONSISTENT WITH PREVIOUSLY REPORTED AND CALLED VALUE.    Culture STREPTOCOCCUS PYOGENES AEROBIC BOTTLE ONLY  (A)  Final   Report Status 03/03/2016 FINAL  Final   Organism ID, Bacteria STREPTOCOCCUS PYOGENES  Final      Susceptibility   Streptococcus pyogenes - MIC*    CLINDAMYCIN Value in next row Sensitive      SENSITIVE0.25    AMPICILLIN Value in next row Sensitive      SENSITIVE0.25    ERYTHROMYCIN Value in next row Sensitive      SENSITIVE0.12    VANCOMYCIN Value in next row Sensitive      SENSITIVE0.5    CEFTRIAXONE Value in  next row Sensitive      SENSITIVE0.12    LEVOFLOXACIN Value in next row Sensitive      SENSITIVE0.5    * STREPTOCOCCUS PYOGENES  Rapid Influenza A&B Antigens ([facility] only)     Status: None   Collection Time: 03/03/2016  1:00 PM  Result Value Ref Range Status   Influenza A ([facility]) NEGATIVE NEGATIVE Final   Influenza B ([facility]) NEGATIVE NEGATIVE Final  Blood culture (routine x 2)     Status: None   Collection Time: 03/25/2016  1:40 PM  Result Value Ref Range Status   Specimen Description BLOOD LEFT FOREARM  Final   Special Requests BOTTLES DRAWN AEROBIC AND ANAEROBIC  4CC  Final   Culture  Setup Time   Final    GRAM POSITIVE COCCI IN BOTH AEROBIC AND ANAEROBIC BOTTLES CRITICAL RESULT CALLED TO, READ BACK BY AND VERIFIED WITH: [place] AT 0426 02/27/16 CAF    Culture   Final    STREPTOCOCCUS PYOGENES IN BOTH AEROBIC AND ANAEROBIC BOTTLES    Report Status 02/29/2016 FINAL  Final   Organism ID, Bacteria STREPTOCOCCUS PYOGENES  Final      Susceptibility   Streptococcus pyogenes - MIC*    CLINDAMYCIN Value in next row Sensitive      SENSITIVE<=0.25    AMPICILLIN Value in next row Sensitive      SENSITIVE<=0.25    ERYTHROMYCIN Value in next row Sensitive      SENSITIVE0.12    VANCOMYCIN Value in next row Sensitive      SENSITIVE0.5    CEFTRIAXONE Value in next row Sensitive      SENSITIVE0.12    LEVOFLOXACIN Value in next row Sensitive      SENSITIVE0.5    * STREPTOCOCCUS PYOGENES  Blood Culture ID Panel (Reflexed)     Status: Abnormal   Collection Time: 03/17/2016  1:40 PM  Result Value Ref Range Status   Enterococcus species NOT DETECTED NOT DETECTED Final   Vancomycin resistance NOT DETECTED NOT DETECTED Final   Listeria monocytogenes NOT DETECTED NOT DETECTED Final   Staphylococcus species NOT DETECTED NOT DETECTED Final   Staphylococcus aureus NOT DETECTED NOT DETECTED Final   Methicillin resistance NOT DETECTED NOT DETECTED Final   Streptococcus species NOT DETECTED NOT  DETECTED Final   Streptococcus agalactiae NOT DETECTED NOT DETECTED Final   Streptococcus pneumoniae NOT DETECTED NOT DETECTED Final  Streptococcus pyogenes DETECTED (A) NOT DETECTED Final    Comment: CRITICAL RESULT CALLED TO, READ BACK BY AND VERIFIED WITH: NATE COOKSON AT 0426 02/27/16 CAF    Acinetobacter baumannii NOT DETECTED NOT DETECTED Final   Enterobacteriaceae species NOT DETECTED NOT DETECTED Final   Enterobacter cloacae complex NOT DETECTED NOT DETECTED Final   Escherichia coli NOT DETECTED NOT DETECTED Final   Klebsiella oxytoca NOT DETECTED NOT DETECTED Final   Klebsiella pneumoniae NOT DETECTED NOT DETECTED Final   Proteus species NOT DETECTED NOT DETECTED Final   Serratia marcescens NOT DETECTED NOT DETECTED Final   Carbapenem resistance NOT DETECTED NOT DETECTED Final   Haemophilus influenzae NOT DETECTED NOT DETECTED Final   Neisseria meningitidis NOT DETECTED NOT DETECTED Final   Pseudomonas aeruginosa NOT DETECTED NOT DETECTED Final   Candida albicans NOT DETECTED NOT DETECTED Final   Candida glabrata NOT DETECTED NOT DETECTED Final   Candida krusei NOT DETECTED NOT DETECTED Final   Candida parapsilosis NOT DETECTED NOT DETECTED Final   Candida tropicalis NOT DETECTED NOT DETECTED Final  Body fluid culture     Status: None   Collection Time: 03/14/2016  3:05 PM  Result Value Ref Range Status   Specimen Description R Knee  Final   Special Requests NONE  Final   Gram Stain   Final    MODERATE WBC SEEN MODERATE GRAM POSITIVE COCCI IN PAIRS AND CHAINS    Culture   Final    HEAVY GROWTH GROUP A STREP (S.PYOGENES) ISOLATED There is no known Penicillin Resistant Beta Streptococcus in the [place] For patients that are Penicillin-allergic, Erythromycin is 85-94% susceptible, and Clindamycin is 80% susceptible.  Contact Microbiology within 7 days if sensitivity testing is  required.   NO ANAEROBES ISOLATED    Report Status 02/29/2016 FINAL  Final  Body fluid culture      Status: None   Collection Time: 03/01/2016  4:10 PM  Result Value Ref Range Status   Specimen Description R Knee  Final   Special Requests NONE  Final   Gram Stain   Final    MANY WBC SEEN MANY GRAM POSITIVE COCCI IN PAIRS AND CHAINS    Culture   Final    HEAVY GROWTH GROUP A STREP (S.PYOGENES) ISOLATED There is no known Penicillin Resistant Beta Streptococcus in the [place] For patients that are Penicillin-allergic, Erythromycin is 85-94% susceptible, and Clindamycin is 80% susceptible.  Contact Microbiology within 7 days if sensitivity testing is  required.   NO ANAEROBES ISOLATED    Report Status 02/29/2016 FINAL  Final  MRSA PCR Screening     Status: None   Collection Time: 02/27/16  3:35 AM  Result Value Ref Range Status   MRSA by PCR NEGATIVE NEGATIVE Final    Comment:        The GeneXpert MRSA Assay (FDA approved for NASAL specimens only), is one component of a comprehensive MRSA colonization surveillance program. It is not intended to diagnose MRSA infection nor to guide or monitor treatment for MRSA infections.   Urine culture     Status: None   Collection Time: 02/27/16  8:30 AM  Result Value Ref Range Status   Specimen Description URINE, RANDOM  Final   Special Requests NONE  Final   Culture NO GROWTH 1 DAY  Final   Report Status 02/28/2016 FINAL  Final  Culture, blood (single) w Reflex to PCR ID Panel     Status: None   Collection Time: 02/29/16  2:36 PM  Result Value Ref Range Status   Specimen Description BLOOD LEFT HAND  Final   Special Requests BOTTLES DRAWN AEROBIC AND ANAEROBIC [place]  Final   Culture NO GROWTH 5 DAYS  Final   Report Status 03/05/2016 FINAL  Final  C difficile quick scan w PCR reflex     Status: None   Collection Time: 03/06/16  5:37 PM  Result Value Ref Range Status   C Diff antigen NEGATIVE NEGATIVE Final   C Diff toxin NEGATIVE NEGATIVE Final   C Diff interpretation Negative for C. difficile  Final  Culture, respiratory  (NON-Expectorated)     Status: None (Preliminary result)   Collection Time: 03/07/16  8:20 AM  Result Value Ref Range Status   Specimen Description TRACHEAL ASPIRATE  Final   Special Requests NONE  Final   Gram Stain   Final    MODERATE WBC SEEN FEW SQUAMOUS EPITHELIAL CELLS PRESENT RARE YEAST    Culture HOLDING FOR POSSIBLE PATHOGEN  Final   Report Status PENDING  Incomplete    Studies/Results: Dg Chest Port 1 View  03/08/2016  CLINICAL DATA:  Respiratory failure, acute renal failure, NSTEMI, septic shock. EXAM: PORTABLE CHEST 1 VIEW COMPARISON:  Portable chest x-ray of March 07, 2016 FINDINGS: The lung volumes remain low. Bibasilar densities persists with obscuration of the hemidiaphragm. The heart is top-normal in size. The central pulmonary vascularity is mildly prominent. The internal jugular venous catheter on the right projects over the proximal SVC. The endotracheal tube tip lies 3.2 cm above the carina. The esophagogastric tube tip in proximal port are "Within a hiatal hernia or partially intra thoracic stomach. The patient has undergone previous CABG. IMPRESSION: Stable appearance of the chest. Bibasilar atelectasis or pneumonia with small bilateral pleural effusions. Mild pulmonary interstitial edema, improved. Electronically Signed   By: Latondra Gebhart  [place] M.D.   On: 03/08/2016 07:30   Dg Chest Port 1 View  03/07/2016  CLINICAL DATA:  Respiratory failure, acute renal failure, NSTEMI, septic shock. EXAM: PORTABLE CHEST 1 VIEW COMPARISON:  Portable chest x-ray of March 06, 2016 FINDINGS: The lungs remain hypoinflated. There are persistent bilateral pleural effusions. Bibasilar atelectasis or pneumonia is present. The cardiac silhouette is mildly enlarged. The central pulmonary vascularity remains engorged. The patient has undergone previous CABG. The endotracheal tube tip lies approximately 2 point 9 cm above the carina. The right internal jugular venous catheter tip projects over the  proximal SVC. The esophagogastric tube tip projects in the region of the gastric cardia with portions of the tube coiled in the gastric fundus. IMPRESSION: Slight interval improvement in bibasilar atelectasis. Persistent hypoinflation with small bilateral pleural effusions. Mild central pulmonary vascular congestion. Electronically Signed   By: Jannie Doyle  [place] M.D.   On: 03/07/2016 07:14    Assessment/Plan: John Schultz is a 75 y.o. male with Group A strep septic knee (Synovial WBC > 300K), sepsis, hypotensive, intubated, no obvious sourse with no evidence cellulitis and no PNA on cxr.  Echo neg for vegetation Fu bcx 4/5 ngtd Dr Rudene Christians following for the knee. Started on CRRT 4/12 - increasing wbc and secretions. C diff neg. Sputum cx sent. CXR without much change 4/13- no fevers, wbc down a little. Extubated today  Recommendations Continue  pcn  FU sputum cx  Continue supportive care. Has multiple lines in place - if wbc continues to increase or febrile ->repeat bcx  Thank you very much for the consult. Will follow with you.  [place], Ekin Pilar P   03/08/2016, 2:31  PM

## 2016-03-08 NOTE — Progress Notes (Signed)
Pastoral Care provided °

## 2016-03-08 NOTE — Progress Notes (Addendum)
This note also relates to the following rows which could not be included: Pulse Rate - Cannot attach notes to unvalidated device data Resp - Cannot attach notes to unvalidated device data SpO2 - Cannot attach notes to unvalidated device data Hemodialysis start

## 2016-03-08 NOTE — Care Management (Signed)
Patient was extubated this morning but required reintubation this afternoon. he die not follow verbal commands.  Patient remains a full code.  ENT consult place for trach.  Patient  would be an ltac but the need for crrt can not be met at that level of care.

## 2016-03-08 NOTE — Progress Notes (Signed)
Dr. Alva Garnet present and gave orders for etomidate and rocuronium for intubation.

## 2016-03-08 NOTE — Progress Notes (Signed)
Extubated per MD order.Patient not clearing secretions well.NTS'd per order for little return,placed on non rebreathing mask.Bipap then started @ MD request

## 2016-03-08 NOTE — Procedures (Signed)
Oral Intubation Procedure Note   Procedure: Intubation Indications: Respiratory insufficiency, failed extubation Consent: Unable to obtain consent because of altered level of consciousness. Time Out: Verified patient identification, verified procedure, site/side was marked, verified correct patient position, special equipment/implants available, medications/allergies/relevent history reviewed, required imaging and test results available.   Pre-meds: Etomidate 20 mg IV  Neuromuscular blockade: Rocuronium 50 mg IV  Laryngoscope: #4 MAC  Visualization: cords partially visualized  ETT: 8.0 ETT passed on first attempt and secured @ 26 cm at upper incisors  Findings: moderately difficult airway   Evaluation:  CXR revealed proper position of ETT  Pt tolerated procedure well without complications   Merton Border, MD ; [facility] service Mobile [phone].  After 5:30 PM or weekends, call [phone]

## 2016-03-08 NOTE — Progress Notes (Signed)
Reintubated at 16:30, patients heart rate in the 120's with labored breathing. He was unable to follow simple commands throughout the day. Dr Rogue Jury notified that patients ET tube needed to be retracted 4 cm per xray. CT done this afternoon. Spoke with Dr Candiss Norse this afternoon and the plan is to start HD if patients blood pressure is stable. If not stable then he will order CRRT. Dialysis nurse was having difficulties with venous pressure and placed activase in dialysis ports. Dialysis nurse states that she will let dewell until tomorrow and re-attemp.

## 2016-03-09 ENCOUNTER — Inpatient Hospital Stay: Payer: Commercial Managed Care - HMO

## 2016-03-09 DIAGNOSIS — E8770 Fluid overload, unspecified: Secondary | ICD-10-CM

## 2016-03-09 LAB — BASIC METABOLIC PANEL
Anion gap: 3 — ABNORMAL LOW (ref 5–15)
BUN: 31 mg/dL — ABNORMAL HIGH (ref 6–20)
CHLORIDE: 108 mmol/L (ref 101–111)
CO2: 28 mmol/L (ref 22–32)
CREATININE: 0.78 mg/dL (ref 0.61–1.24)
Calcium: 7.4 mg/dL — ABNORMAL LOW (ref 8.9–10.3)
GFR calc Af Amer: >60 mL/min (ref >60)
GFR calc non Af Amer: >60 mL/min (ref >60)
GLUCOSE: 117 mg/dL — AB (ref 65–99)
Potassium: 4.4 mmol/L (ref 3.5–5.1)
Sodium: 139 mmol/L (ref 135–145)

## 2016-03-09 LAB — CULTURE, RESPIRATORY W GRAM STAIN

## 2016-03-09 LAB — CBC
HEMATOCRIT: 22 % — AB (ref 40.0–52.0)
HEMOGLOBIN: 7.4 g/dL — AB (ref 13.0–18.0)
MCH: 31.3 pg (ref 26.0–34.0)
MCHC: 33.8 g/dL (ref 32.0–36.0)
MCV: 92.9 fL (ref 80.0–100.0)
Platelets: 84 10*3/uL — ABNORMAL LOW (ref 150–440)
RBC: 2.36 MIL/uL — ABNORMAL LOW (ref 4.40–5.90)
RDW: 14.6 % — AB (ref 11.5–14.5)
WBC: 10.1 10*3/uL (ref 3.8–10.6)

## 2016-03-09 LAB — PROCALCITONIN: Procalcitonin: 2.66 ng/mL

## 2016-03-09 LAB — RENAL FUNCTION PANEL
ALBUMIN: 1.3 g/dL — AB (ref 3.5–5.0)
ANION GAP: 5 (ref 5–15)
Albumin: 1.4 g/dL — ABNORMAL LOW (ref 3.5–5.0)
Anion gap: 3 — ABNORMAL LOW (ref 5–15)
BUN: 31 mg/dL — ABNORMAL HIGH (ref 6–20)
BUN: 29 mg/dL — AB (ref 6–20)
CHLORIDE: 108 mmol/L (ref 101–111)
CHLORIDE: 107 mmol/L (ref 101–111)
CO2: 25 mmol/L (ref 22–32)
CO2: 27 mmol/L (ref 22–32)
CREATININE: 0.81 mg/dL (ref 0.61–1.24)
Calcium: 7.5 mg/dL — ABNORMAL LOW (ref 8.9–10.3)
Calcium: 7.7 mg/dL — ABNORMAL LOW (ref 8.9–10.3)
Creatinine, Ser: 0.89 mg/dL (ref 0.61–1.24)
GFR calc Af Amer: >60 mL/min (ref >60)
GFR calc non Af Amer: >60 mL/min (ref >60)
GLUCOSE: 208 mg/dL — AB (ref 65–99)
Glucose, Bld: 151 mg/dL — ABNORMAL HIGH (ref 65–99)
PHOSPHORUS: 3.5 mg/dL (ref 2.5–4.6)
POTASSIUM: 4.6 mmol/L (ref 3.5–5.1)
Phosphorus: 3.5 mg/dL (ref 2.5–4.6)
Potassium: 4.5 mmol/L (ref 3.5–5.1)
SODIUM: 137 mmol/L (ref 135–145)
Sodium: 138 mmol/L (ref 135–145)

## 2016-03-09 LAB — GLUCOSE, CAPILLARY
GLUCOSE-CAPILLARY: 190 mg/dL — AB (ref 65–99)
Glucose-Capillary: 100 mg/dL — ABNORMAL HIGH (ref 65–99)
Glucose-Capillary: 122 mg/dL — ABNORMAL HIGH (ref 65–99)
Glucose-Capillary: 127 mg/dL — ABNORMAL HIGH (ref 65–99)
Glucose-Capillary: 135 mg/dL — ABNORMAL HIGH (ref 65–99)
Glucose-Capillary: 143 mg/dL — ABNORMAL HIGH (ref 65–99)
Glucose-Capillary: 155 mg/dL — ABNORMAL HIGH (ref 65–99)
Glucose-Capillary: 172 mg/dL — ABNORMAL HIGH (ref 65–99)

## 2016-03-09 LAB — TROPONIN I: TROPONIN I: 1.05 ng/mL — AB (ref <0.031)

## 2016-03-09 LAB — HEPATITIS B SURFACE ANTIGEN: Hepatitis B Surface Ag: NEGATIVE

## 2016-03-09 LAB — CULTURE, RESPIRATORY: CULTURE: NORMAL

## 2016-03-09 LAB — MAGNESIUM: MAGNESIUM: 1.7 mg/dL (ref 1.7–2.4)

## 2016-03-09 MED ORDER — HEPARIN SODIUM (PORCINE) 1000 UNIT/ML DIALYSIS
1000.0000 [IU] | INTRAMUSCULAR | Status: DC | PRN
  Administered 2016-03-14 – 2016-03-15 (×2): 1000 [IU] via INTRAVENOUS_CENTRAL
  Filled 2016-03-09 (×5): qty 6

## 2016-03-09 MED ORDER — DEXMEDETOMIDINE HCL IN NACL 400 MCG/100ML IV SOLN
0.4000 ug/kg/h | INTRAVENOUS | Status: DC
  Administered 2016-03-09 – 2016-03-10 (×5): 0.4 ug/kg/h via INTRAVENOUS
  Administered 2016-03-10: 0.2 ug/kg/h via INTRAVENOUS
  Administered 2016-03-10: 0.4 ug/kg/h via INTRAVENOUS
  Administered 2016-03-10: 0.5 ug/kg/h via INTRAVENOUS
  Administered 2016-03-11 (×2): 0.4 ug/kg/h via INTRAVENOUS
  Administered 2016-03-11: 0.6 ug/kg/h via INTRAVENOUS
  Administered 2016-03-11 (×2): 0.4 ug/kg/h via INTRAVENOUS
  Administered 2016-03-12 (×2): 0.2 ug/kg/h via INTRAVENOUS
  Administered 2016-03-13 – 2016-03-15 (×8): 0.4 ug/kg/h via INTRAVENOUS
  Administered 2016-03-16: 0.2 ug/kg/h via INTRAVENOUS
  Administered 2016-03-16: 0.4 ug/kg/h via INTRAVENOUS
  Filled 2016-03-09 (×3): qty 50
  Filled 2016-03-09 (×2): qty 100
  Filled 2016-03-09: qty 50
  Filled 2016-03-09: qty 100
  Filled 2016-03-09: qty 50
  Filled 2016-03-09: qty 100
  Filled 2016-03-09: qty 50
  Filled 2016-03-09: qty 100
  Filled 2016-03-09 (×6): qty 50
  Filled 2016-03-09: qty 100
  Filled 2016-03-09 (×2): qty 50
  Filled 2016-03-09: qty 100
  Filled 2016-03-09 (×3): qty 50
  Filled 2016-03-09: qty 100
  Filled 2016-03-09 (×4): qty 50

## 2016-03-09 MED ORDER — PUREFLOW DIALYSIS SOLUTION
INTRAVENOUS | Status: DC
  Administered 2016-03-09 – 2016-03-10 (×5): via INTRAVENOUS_CENTRAL
  Administered 2016-03-11 – 2016-03-15 (×4): 3 via INTRAVENOUS_CENTRAL

## 2016-03-09 MED ORDER — FAMOTIDINE 40 MG/5ML PO SUSR
20.0000 mg | Freq: Every day | ORAL | Status: DC
  Administered 2016-03-09 – 2016-03-11 (×3): 20 mg
  Filled 2016-03-09 (×7): qty 2.5

## 2016-03-09 MED ORDER — PRO-STAT SUGAR FREE PO LIQD
30.0000 mL | Freq: Four times a day (QID) | ORAL | Status: DC
  Administered 2016-03-09 – 2016-03-11 (×10): 30 mL via ORAL

## 2016-03-09 MED ORDER — ALBUMIN HUMAN 25 % IV SOLN
12.5000 g | Freq: Three times a day (TID) | INTRAVENOUS | Status: AC
  Administered 2016-03-09 – 2016-03-13 (×12): 12.5 g via INTRAVENOUS
  Filled 2016-03-09 (×14): qty 50

## 2016-03-09 MED ORDER — VITAL AF 1.2 CAL PO LIQD
1000.0000 mL | ORAL | Status: DC
  Administered 2016-03-09 – 2016-03-11 (×4): 1000 mL

## 2016-03-09 NOTE — Progress Notes (Addendum)
PULMONARY / CRITICAL CARE MEDICINE   Name: John Schultz MRN: [id number] DOB: 04-08-1941    ADMISSION DATE:  03/25/2016  BRIEF HISTORY: 75 year old male past medical history of hypertension, chronic diastolic heart failure, CK, diabetes, presented on April 2 with four-day history of progressive right knee pain fever, shortness of breath, cough. Within 24 hours decompensated requiring intubation on 04/03.  Seen by orthopedics, had drain placed in right knee with purulent material. Now with mechanical ventilation and slowly been weaned off.   SUBJECTIVE:  No acute issues overnight. Remains on full vent support. CRRT initiated last night. Failed SBT 04/09 due to respiratory muscle fatique and agitation.  Positive Hemoccult 04/08; H/H stable at 8.2/23.1 post transfusion of 1 unit of PRBCs on 04/09.  SIGNIFICANT EVENTS/STUDIES: 04/02 admitted by Hospitalist Service with 4 day history of right knee pain - swollen, erythematous and tender on exam - and severe sepsis, AKI, abnormal EKG. Cardiac markers positive (peak troponin I 8.10) 04/03 Orthopedics consultation and aspiration of R Knee, with drain placement 04/03 ID consultation 04/03 Nephrology consultation 04/04 TTE: LVEF 60-65%, no vegetations 04/04 altered mental status, worsening respiratory status, septic, intubated 04/04 R knee hemovac removed by ortho 04/06 Cardiology consultation: NSTEMI - deemed secondary to demand ischemia. Further W/U to be considered after resolution of severe sepsis and critical illness 04/09 CRRT initiated  04/10 agitated on WUA. Failed SBT. Off and on norepienphrine 04/11 Tolerating volume removal by CRRT. Failed SBT. Tolerated PS 10-14 cm H2O 04/12 Failed SBT. Agitated on WUA. Uo improving. Started back on fentanyl gtt in PM 04/13 LE venous US:  04/13 Passed SBT. Cognition remains marginal. Extubated. Post extubation stridor and poor cough. BiPAP initiated. High risk of re-intubation 04/13 Re-intubated in  afternoon. ENT consult for trach tube placement - planned 04/17.  04/13 CT head: diffuse atrophy, no acute disease 04/14 CRRT resumed due to hypervolemia  INDWELLING DEVICES:: R IJ CVL 04/03 >>  ETT 04/03 >> 04/13, 04/13 >>  R femoral HD cath 04/09 >>   MICRO DATA: 04/02 MRSA PCR >> NEG 04/02 flu screen >> NEG 04/02 blood >> Strep pyogenes 04/02 knee aspirate >> Strep pyogenes 04/05 blood >> NEG 04/11 C diff >> NEG 04/12 Resp >> NOF 04/13 Resp >>   PCT algorithm 04/03: 22.50 > 16.65 > 7.12 PCT algorithm 04/12: 0.62 > 0.57 > 2.66  ANTIMICROBIALS:  Ceftriaxone 04/02 X 1  Vanc 04/02 >> 04/03 Pip-tazo 04/02 >> 04/03 Clinda 04/03 >> 04/07 Pen G 04/03 >>    VITAL SIGNS: Temp:  [97.7 F (36.5 C)-98.3 F (36.8 C)] 97.9 F (36.6 C) (04/14 0700) Pulse Rate:  [83-117] 87 (04/14 1200) Resp:  [15-35] 26 (04/14 1200) BP: (60-143)/(38-97) 93/45 mmHg (04/14 1200) SpO2:  [80 %-100 %] 100 % (04/14 1200) FiO2 (%):  [35 %-50 %] 35 % (04/14 1130) Weight:  [104 kg (229 lb 4.5 oz)-105.5 kg (232 lb 9.4 oz)] 105.5 kg (232 lb 9.4 oz) (04/14 0420) HEMODYNAMICS:   VENTILATOR SETTINGS: Vent Mode:  [-] PRVC FiO2 (%):  [35 %-50 %] 35 % Set Rate:  [16 bmp] 16 bmp Vt Set:  [500 mL] 500 mL PEEP:  [5 cmH20] 5 cmH20 INTAKE / OUTPUT:  Intake/Output Summary (Last 24 hours) at 03/09/16 1230 Last data filed at 03/09/16 1117  Gross per 24 hour  Intake 661.04 ml  Output    957 ml  Net -295.96 ml    Review of Systems  Unable to perform ROS: intubated    Physical Exam  Constitutional:  RASS -3, not F/C  HENT:  Head: Normocephalic and atraumatic.  Eyes: EOM are normal. Pupils are equal, round, and reactive to light.  Cardiovascular: Regular rhythm and normal heart sounds.   No murmur heard. Tachy  Pulmonary/Chest: No respiratory distress. He has no wheezes. He has no rales.  Abdominal: Soft. Bowel sounds are normal. He exhibits no distension. There is no tenderness.  Musculoskeletal:   R>L LE edema  Neurological: He has normal reflexes.  Moves all extremities     LABS:  CBC  Recent Labs Lab 03/07/16 0543 03/08/16 0630 03/09/16 0419  WBC 15.9* 14.5* 10.1  HGB 9.0* 8.4* 7.4*  HCT 27.1* 25.5* 22.0*  PLT 88* 85* 84*   Coag's No results for input(s): APTT, INR in the last 168 hours. BMET  Recent Labs Lab 03/08/16 0244 03/08/16 0630 03/09/16 0419  NA 138 138 139  K 4.3 4.4 4.4  CL 108 108 108  CO2 30 29 28   BUN 27* 26* 31*  CREATININE 0.47* 0.47* 0.78  GLUCOSE 104* 113* 117*   Electrolytes  Recent Labs Lab 03/07/16 2259 03/08/16 0244 03/08/16 0630 03/09/16 0419  CALCIUM 7.8* 7.9* 7.9* 7.4*  MG 1.6* 1.6* 1.6*  --   PHOS 1.9* 2.0* 2.2*  --    Sepsis Markers  Recent Labs Lab 03/07/16 0922 03/08/16 0630 03/09/16 0419  PROCALCITON 0.62 0.57 2.66   ABG  Recent Labs Lab 03/03/16 1053  PHART 7.47*  PCO2ART 31*  PO2ART 80*   Liver Enzymes  Recent Labs Lab 03/06/16 0513  03/07/16 2259 03/08/16 0244 03/08/16 0630  AST 48*  --   --   --   --   ALT 28  --   --   --   --   ALKPHOS 77  --   --   --   --   BILITOT 0.6  --   --   --   --   ALBUMIN 1.3*  1.4*  < > 1.3* 1.2* 1.2*  < > = values in this interval not displayed. Cardiac Enzymes  Recent Labs Lab 03/09/16 0419  TROPONINI 1.05*   Glucose  Recent Labs Lab 03/08/16 2000 03/09/16 0014 03/09/16 0334 03/09/16 0735 03/09/16 1109 03/09/16 1149  GLUCAP 146* 127* 100* 135* 122* 155*    CXR: Improving bilateral airspace opacities with persistent perihilar and lower lobe airspace disease   ASSESSMENT / PLAN:  PULMONARY Acute vent dep respiratory failure Pulm edema pattern on CXR Failed extubation 04/13 P: Cont vent support - settings reviewed and/or adjusted Cont vent bundle Daily SBT if/when meets criteria ENT plans trach tube o4/17   CARDIOVASCULAR Septic shock, resolved NSTEMI - Cardiology has seen PAF P:  MAP goal > 60 mmHg PRN  phenylephrine Cont ASA and statin Further cardiac w/u after critical illness resolves  RENAL AKI Severe hypervolemia P: Monitor BMET intermittently Monitor I/Os Correct electrolytes as indicated Renal Service following CRRT to resume 04/14  GASTROINTESTINAL Diarrhea  Hemoccult positive  P: SUP: enteral famotidine Cont TFs  HEMATOLOGIC Anemia without overt bleeding Thrombocytopenia  P: DVT px: SCDs (SQ heparin DC'd 04/12) Monitor CBC intermittently Transfuse per usual guidelines  INFECTIOUS Septic arthritis of the R knee Purulent ET secretions - much improved Small increase in PCT 04/14 - unclear significance P: Monitor temp, WBC count Micro and abx as above ID service following Recheck PCT 04/15  ENDOCRINE DM 2, controlled Hyperglycemia improved P: Cont Lantus Cont SSI  NEUROLOGIC ICU acquired delirium - hypoactive Profound deconditioning  P: RASS goal: 0, -1 Precedex initiated 04/14 PRN fentanyl Willl need extensive rehab if survives this acute phase of illness   Disposition and family update: Wife updated @ bedside.   CCM time: 40 mins The above time includes time spent in consultation with patient and/or family members and reviewing care plan on multidisciplinary rounds  Merton Border, MD PCCM service Mobile [phone] Pager [phone]

## 2016-03-09 NOTE — Progress Notes (Signed)
Removed activase from both lines, blood return present. CRRT initiated per Dr Candiss Norse due to patients BP.

## 2016-03-09 NOTE — Progress Notes (Signed)
Patient tolerated CRRT with no complications. Neo started to maintain a MAP of 65. Central line intact, dialysis catheter intact. Tolerating tube feeds, minimal output noted rectal tube. Patient unable to follow any simple commands throughout shift. Does open eyes. Family updated throughout shift.

## 2016-03-09 NOTE — Care Management (Signed)
Wife asking questions about care options.  Discussed ltac level of care: [employer] approval process, 2 facilities in [place], ability to meet the needs of hemodialysis (but not continuous), benefits.  Wife gave permission  for ltac liaisons to look over medical information.  Wife does not wish to discuss it or make facility choice until some of the adult children are able  to participate in decision- next week.  Discussed palliative care team members can provide assistance with medical decisions.

## 2016-03-09 NOTE — Progress Notes (Signed)
Pt now has elevated troponin of 1.05.  It is now trending down from previous result of 8.10.  Pt is on aspirin, pravachol, and vented.  Pt had an ekg done this AM.  Elink notifed of troponin level. Spoke with [place], rn.  No new orders were given.

## 2016-03-09 NOTE — Progress Notes (Signed)
[facility] NOTE  Pharmacy Consult for CRRT Medication Review   Allergies  Allergen Reactions  . Tape Rash    Patient Measurements: Height: 6' (182.9 cm) Weight: 232 lb 9.4 oz (105.5 kg) IBW/kg (Calculated) : 77.6  Vital Signs: Temp: 97.9 F (36.6 C) (04/14 0700) Temp Source: Oral (04/14 0300) BP: 86/48 mmHg (04/14 0700) Pulse Rate: 96 (04/14 0700) Intake/Output from previous day: 04/13 0701 - 04/14 0700 In: 661 [I.V.:51; NG/GT:210; IV Piggyback:400] Out: 679 [Urine:835; Stool:50] Intake/Output from this shift: Total I/O In: -  Out: 225 [Urine:225] Vent settings for last 24 hours: Vent Mode:  [-] PRVC FiO2 (%):  [35 %-50 %] 35 % Set Rate:  [16 bmp] 16 bmp Vt Set:  [500 mL] 500 mL PEEP:  [5 cmH20] 5 cmH20  Labs:  Recent Labs  03/07/16 0543  03/07/16 2259 03/08/16 0244 03/08/16 0630 03/09/16 0419  WBC 15.9*  --   --   --  14.5* 10.1  HGB 9.0*  --   --   --  8.4* 7.4*  HCT 27.1*  --   --   --  25.5* 22.0*  PLT 88*  --   --   --  85* 84*  CREATININE 0.63  0.61  < > 0.49* 0.47* 0.47* 0.78  MG 1.7  < > 1.6* 1.6* 1.6*  --   PHOS 2.3*  < > 1.9* 2.0* 2.2*  --   ALBUMIN 1.4*  < > 1.3* 1.2* 1.2*  --   < > = values in this interval not displayed. Estimated Creatinine Clearance: 103.3 mL/min (by C-G formula based on Cr of 0.78).   Recent Labs  03/09/16 0735 03/09/16 1109 03/09/16 1149  GLUCAP 135* 122* 155*    Microbiology: Recent Results (from the past 720 hour(s))  Blood culture (routine x 2)     Status: Abnormal   Collection Time: 03/21/2016 12:59 PM  Result Value Ref Range Status   Specimen Description BLOOD LEFT FOREARM  Final   Special Requests BOTTLES DRAWN AEROBIC AND ANAEROBIC  1CC  Final   Culture  Setup Time   Final    GRAM POSITIVE COCCI AEROBIC BOTTLE ONLY CRITICAL VALUE NOTED.  VALUE IS CONSISTENT WITH PREVIOUSLY REPORTED AND CALLED VALUE.    Culture STREPTOCOCCUS PYOGENES AEROBIC BOTTLE ONLY  (A)  Final   Report  Status 03/03/2016 FINAL  Final   Organism ID, Bacteria STREPTOCOCCUS PYOGENES  Final      Susceptibility   Streptococcus pyogenes - MIC*    CLINDAMYCIN Value in next row Sensitive      SENSITIVE0.25    AMPICILLIN Value in next row Sensitive      SENSITIVE0.25    ERYTHROMYCIN Value in next row Sensitive      SENSITIVE0.12    VANCOMYCIN Value in next row Sensitive      SENSITIVE0.5    CEFTRIAXONE Value in next row Sensitive      SENSITIVE0.12    LEVOFLOXACIN Value in next row Sensitive      SENSITIVE0.5    * STREPTOCOCCUS PYOGENES  Rapid Influenza A&B Antigens ([facility] only)     Status: None   Collection Time: 03/22/2016  1:00 PM  Result Value Ref Range Status   Influenza A ([facility]) NEGATIVE NEGATIVE Final   Influenza B ([facility]) NEGATIVE NEGATIVE Final  Blood culture (routine x 2)     Status: None   Collection Time: 03/13/2016  1:40 PM  Result Value Ref Range Status   Specimen Description BLOOD  LEFT FOREARM  Final   Special Requests BOTTLES DRAWN AEROBIC AND ANAEROBIC  4CC  Final   Culture  Setup Time   Final    GRAM POSITIVE COCCI IN BOTH AEROBIC AND ANAEROBIC BOTTLES CRITICAL RESULT CALLED TO, READ BACK BY AND VERIFIED WITH: [place] AT 1610 02/27/16 CAF    Culture   Final    STREPTOCOCCUS PYOGENES IN BOTH AEROBIC AND ANAEROBIC BOTTLES    Report Status 02/29/2016 FINAL  Final   Organism ID, Bacteria STREPTOCOCCUS PYOGENES  Final      Susceptibility   Streptococcus pyogenes - MIC*    CLINDAMYCIN Value in next row Sensitive      SENSITIVE<=0.25    AMPICILLIN Value in next row Sensitive      SENSITIVE<=0.25    ERYTHROMYCIN Value in next row Sensitive      SENSITIVE0.12    VANCOMYCIN Value in next row Sensitive      SENSITIVE0.5    CEFTRIAXONE Value in next row Sensitive      SENSITIVE0.12    LEVOFLOXACIN Value in next row Sensitive      SENSITIVE0.5    * STREPTOCOCCUS PYOGENES  Blood Culture ID Panel (Reflexed)     Status: Abnormal   Collection Time: 02/25/2016  1:40 PM   Result Value Ref Range Status   Enterococcus species NOT DETECTED NOT DETECTED Final   Vancomycin resistance NOT DETECTED NOT DETECTED Final   Listeria monocytogenes NOT DETECTED NOT DETECTED Final   Staphylococcus species NOT DETECTED NOT DETECTED Final   Staphylococcus aureus NOT DETECTED NOT DETECTED Final   Methicillin resistance NOT DETECTED NOT DETECTED Final   Streptococcus species NOT DETECTED NOT DETECTED Final   Streptococcus agalactiae NOT DETECTED NOT DETECTED Final   Streptococcus pneumoniae NOT DETECTED NOT DETECTED Final   Streptococcus pyogenes DETECTED (A) NOT DETECTED Final    Comment: CRITICAL RESULT CALLED TO, READ BACK BY AND VERIFIED WITH: NATE COOKSON AT 0426 02/27/16 CAF    Acinetobacter baumannii NOT DETECTED NOT DETECTED Final   Enterobacteriaceae species NOT DETECTED NOT DETECTED Final   Enterobacter cloacae complex NOT DETECTED NOT DETECTED Final   Escherichia coli NOT DETECTED NOT DETECTED Final   Klebsiella oxytoca NOT DETECTED NOT DETECTED Final   Klebsiella pneumoniae NOT DETECTED NOT DETECTED Final   Proteus species NOT DETECTED NOT DETECTED Final   Serratia marcescens NOT DETECTED NOT DETECTED Final   Carbapenem resistance NOT DETECTED NOT DETECTED Final   Haemophilus influenzae NOT DETECTED NOT DETECTED Final   Neisseria meningitidis NOT DETECTED NOT DETECTED Final   Pseudomonas aeruginosa NOT DETECTED NOT DETECTED Final   Candida albicans NOT DETECTED NOT DETECTED Final   Candida glabrata NOT DETECTED NOT DETECTED Final   Candida krusei NOT DETECTED NOT DETECTED Final   Candida parapsilosis NOT DETECTED NOT DETECTED Final   Candida tropicalis NOT DETECTED NOT DETECTED Final  Body fluid culture     Status: None   Collection Time: 03/25/2016  3:05 PM  Result Value Ref Range Status   Specimen Description R Knee  Final   Special Requests NONE  Final   Gram Stain   Final    MODERATE WBC SEEN MODERATE GRAM POSITIVE COCCI IN PAIRS AND CHAINS     Culture   Final    HEAVY GROWTH GROUP A STREP (S.PYOGENES) ISOLATED There is no known Penicillin Resistant Beta Streptococcus in the [place] For patients that are Penicillin-allergic, Erythromycin is 85-94% susceptible, and Clindamycin is 80% susceptible.  Contact Microbiology within 7 days if  sensitivity testing is  required.   NO ANAEROBES ISOLATED    Report Status 02/29/2016 FINAL  Final  Body fluid culture     Status: None   Collection Time: 03/02/2016  4:10 PM  Result Value Ref Range Status   Specimen Description R Knee  Final   Special Requests NONE  Final   Gram Stain   Final    MANY WBC SEEN MANY GRAM POSITIVE COCCI IN PAIRS AND CHAINS    Culture   Final    HEAVY GROWTH GROUP A STREP (S.PYOGENES) ISOLATED There is no known Penicillin Resistant Beta Streptococcus in the [place] For patients that are Penicillin-allergic, Erythromycin is 85-94% susceptible, and Clindamycin is 80% susceptible.  Contact Microbiology within 7 days if sensitivity testing is  required.   NO ANAEROBES ISOLATED    Report Status 02/29/2016 FINAL  Final  MRSA PCR Screening     Status: None   Collection Time: 02/27/16  3:35 AM  Result Value Ref Range Status   MRSA by PCR NEGATIVE NEGATIVE Final    Comment:        The GeneXpert MRSA Assay (FDA approved for NASAL specimens only), is one component of a comprehensive MRSA colonization surveillance program. It is not intended to diagnose MRSA infection nor to guide or monitor treatment for MRSA infections.   Urine culture     Status: None   Collection Time: 02/27/16  8:30 AM  Result Value Ref Range Status   Specimen Description URINE, RANDOM  Final   Special Requests NONE  Final   Culture NO GROWTH 1 DAY  Final   Report Status 02/28/2016 FINAL  Final  Culture, blood (single) w Reflex to PCR ID Panel     Status: None   Collection Time: 02/29/16  2:36 PM  Result Value Ref Range Status   Specimen Description BLOOD LEFT HAND  Final   Special Requests  BOTTLES DRAWN AEROBIC AND ANAEROBIC [place]  Final   Culture NO GROWTH 5 DAYS  Final   Report Status 03/05/2016 FINAL  Final  C difficile quick scan w PCR reflex     Status: None   Collection Time: 03/06/16  5:37 PM  Result Value Ref Range Status   C Diff antigen NEGATIVE NEGATIVE Final   C Diff toxin NEGATIVE NEGATIVE Final   C Diff interpretation Negative for C. difficile  Final  Culture, respiratory (NON-Expectorated)     Status: None (Preliminary result)   Collection Time: 03/07/16  8:20 AM  Result Value Ref Range Status   Specimen Description TRACHEAL ASPIRATE  Final   Special Requests NONE  Final   Gram Stain   Final    MODERATE WBC SEEN FEW SQUAMOUS EPITHELIAL CELLS PRESENT RARE YEAST    Culture HOLDING FOR POSSIBLE PATHOGEN  Final   Report Status PENDING  Incomplete  Culture, respiratory (NON-Expectorated)     Status: None (Preliminary result)   Collection Time: 03/08/16  4:52 PM  Result Value Ref Range Status   Specimen Description TRACHEAL ASPIRATE  Final   Special Requests NONE  Final   Gram Stain PENDING  Incomplete   Culture Consistent with normal respiratory flora.  Final   Report Status PENDING  Incomplete    Medications:  Scheduled:  . albumin human  12.5 g Intravenous 3 times per day  . antiseptic oral rinse  7 mL Mouth Rinse QID  . aspirin  325 mg Per Tube Daily  . budesonide (PULMICORT) nebulizer solution  0.25 mg Nebulization BID  .  chlorhexidine gluconate (SAGE KIT)  15 mL Mouth Rinse BID  . feeding supplement (PRO-STAT SUGAR FREE 64)  30 mL Oral QID  . insulin aspart  0-15 Units Subcutaneous 6 times per day  . ipratropium-albuterol  3 mL Nebulization Q6H  . pantoprazole (PROTONIX) IV  40 mg Intravenous Q24H  . pencillin G potassium IV  3 Million Units Intravenous 4 times per day  . pravastatin  40 mg Per Tube q1800  . sodium chloride flush  3 mL Intravenous Q12H   Infusions:  . alteplase    . dexmedetomidine 0.4 mcg/kg/hr (03/09/16 1117)   . feeding supplement (VITAL AF 1.2 CAL) 1,000 mL (03/09/16 1105)  . phenylephrine (NEO-SYNEPHRINE) Adult infusion Stopped (03/09/16 0010)  . pureflow 2,000 mL/hr at 03/09/16 0945    Assessment: 75 y/o M admitted with septic shock now requiring CRRT. Pharmacy consulted to adjust medication dosing for CRRT.   Plan:  No medications require adjustment at present.   Pharmacy will continue to monitor and adjust per consult.   Ulice Dash D 03/09/2016,11:54 AM

## 2016-03-09 NOTE — Progress Notes (Signed)
Nutrition Follow-up  DOCUMENTATION CODES:   Not applicable  INTERVENTION:   EN: nutritional needs re-assessed post re-intubation; off diprivan at present. Recommend changing to Vital 1.2 at new goal of 55 ml/hr with addition of Prostat QID providing 159 g of protein (1.5 g/kg current wt, 1.9 g/kg dry wt/UBW), 1984 kcals, 1069 mL of free water. Meets 100% estimated protein and calorie needs.  Recommend tube maintenance flushes only at present as ordered per Adult Tube Feeding Protocol. Continue to assess  NUTRITION DIAGNOSIS:   Inadequate oral intake related to acute illness as evidenced by NPO status.  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, respiratory distress with severe acidosis requiring intubation on 02/27/16  Pt extubated yesterday (4/13) but required reintubation; trial of HD yesterday but did not tolerate; plan to restart CRRT today, 4L dialysate, UF 100 ml/hr. Noted ENT consult for trach placement  Patient is currently intubated on ventilator support MV: 14.2 L/min Temp (24hrs), Avg:97.8 F (36.6 C), Min:97.3 F (36.3 C), Max:98.3 F (36.8 C)   EN: tolerating Vital High Protein at rate of 40 ml/hr  Digestive System: BS hypoactive, abdomen soft/obese  Skin:   (stage I pressure ulcer on buttock, stage II on sacrum)  Last BM:  03/08/16 rectal tube in place   Labs: reviewed  Meds: ss novolog, neosynephrine  Height:   Ht Readings from Last 1 Encounters:  02/28/16 6' (1.829 m)    Weight: pt with significant fluid on board; using estimated dry weight/UBW of 85 kg Wt Readings from Last 1 Encounters:  03/09/16 232 lb 9.4 oz (105.5 kg)    BMI:  Body mass index is 31.54 kg/(m^2).  Estimated Nutritional Needs:   Kcal:  1903  kcals (Ve: 14.2, Tmax: 36.8)   Protein:  128-170 g (1.5-2.0 g/kg)   Fluid:  >1.9 L  EDUCATION NEEDS:   Education needs no appropriate at this time  [place], [place], [place] [phone] Pager  [phone] Weekend/On-Call Pager

## 2016-03-09 NOTE — Progress Notes (Signed)
Subjective:  Patient seen at bedside. UOP > 800 cc IHD was tried yesterday but BP remained low Had to be re-intubated 4/14 due to respiratory distress    Objective:  Vital signs in last 24 hours:  Temp:  [97.3 F (36.3 C)-98.3 F (36.8 C)] 97.9 F (36.6 C) (04/14 0700) Pulse Rate:  [83-120] 96 (04/14 0700) Resp:  [15-35] 23 (04/14 0700) BP: (60-151)/(38-97) 86/48 mmHg (04/14 0700) SpO2:  [80 %-100 %] 100 % (04/14 0749) FiO2 (%):  [35 %-50 %] 35 % (04/14 0749) Weight:  [104 kg (229 lb 4.5 oz)-105.5 kg (232 lb 9.4 oz)] 105.5 kg (232 lb 9.4 oz) (04/14 0420)  Weight change: 0 kg (0 lb) Filed Weights   03/08/16 0500 03/08/16 1330 03/09/16 0420  Weight: 104 kg (229 lb 4.5 oz) 104 kg (229 lb 4.5 oz) 105.5 kg (232 lb 9.4 oz)    Intake/Output:    Intake/Output Summary (Last 24 hours) at 03/09/16 0940 Last data filed at 03/09/16 0600  Gross per 24 hour  Intake 661.04 ml  Output    619 ml  Net  42.04 ml     Physical Exam: General: Critically ill appearing   HEENT ETT  Neck supple  Pulm/lungs Vent assisted bilateral rhonchi  CVS/Heart Regular, tachycardic, soft systolic murmur  Abdomen:  Soft, non distended, BS present  Extremities: ++ peripheral edema,    Neurologic: Sedated,  Not following commands  Skin: Heel skin breakdown  GU: Foley       Basic Metabolic Panel:   Recent Labs Lab 03/07/16 1417 03/07/16 1804 03/07/16 2259 03/08/16 0244 03/08/16 0630 03/09/16 0419  NA 138 139 137 138 138 139  K 3.9 4.0 3.9 4.3 4.4 4.4  CL 108 108 108 108 108 108  CO2 _0 GLUCOSE 140* 84 66 104* 113* 117*  BUN 27* 27* 26* 27* 26* 31*  CREATININE 0.53* 0.48* 0.49* 0.47* 0.47* 0.78  CALCIUM 7.9* 8.0* 7.8* 7.9* 7.9* 7.4*  MG 1.7 1.6* 1.6* 1.6* 1.6*  --   PHOS 1.9* 1.9* 1.9* 2.0* 2.2*  --      CBC:  Recent Labs Lab 03/05/16 0557 03/06/16 0513 03/07/16 0543 03/08/16 0630 03/09/16 0419  WBC 7.9 11.6* 15.9* 14.5* 10.1  HGB 8.2* 8.5* 9.0* 8.4* 7.4*   HCT 23.1* 25.5* 27.1* 25.5* 22.0*  MCV 87.9 89.7 89.7 90.9 92.9  PLT 110* 81* 88* 85* 84*      Microbiology:  Recent Results (from the past 720 hour(s))  Blood culture (routine x 2)     Status: Abnormal   Collection Time: 03/10/2016 12:59 PM  Result Value Ref Range Status   Specimen Description BLOOD LEFT FOREARM  Final   Special Requests BOTTLES DRAWN AEROBIC AND ANAEROBIC  1CC  Final   Culture  Setup Time   Final    GRAM POSITIVE COCCI AEROBIC BOTTLE ONLY CRITICAL VALUE NOTED.  VALUE IS CONSISTENT WITH PREVIOUSLY REPORTED AND CALLED VALUE.    Culture STREPTOCOCCUS PYOGENES AEROBIC BOTTLE ONLY  (A)  Final   Report Status 03/03/2016 FINAL  Final   Organism ID, Bacteria STREPTOCOCCUS PYOGENES  Final      Susceptibility   Streptococcus pyogenes - MIC*    CLINDAMYCIN Value in next row Sensitive      SENSITIVE0.25    AMPICILLIN Value in next row Sensitive      SENSITIVE0.25    ERYTHROMYCIN Value in next row Sensitive      SENSITIVE0.12    VANCOMYCIN  Value in next row Sensitive      SENSITIVE0.5    CEFTRIAXONE Value in next row Sensitive      SENSITIVE0.12    LEVOFLOXACIN Value in next row Sensitive      SENSITIVE0.5    * STREPTOCOCCUS PYOGENES  Rapid Influenza A&B Antigens ([facility] only)     Status: None   Collection Time: 03/05/2016  1:00 PM  Result Value Ref Range Status   Influenza A ([place]) NEGATIVE NEGATIVE Final   Influenza B ([facility]) NEGATIVE NEGATIVE Final  Blood culture (routine x 2)     Status: None   Collection Time: 03/08/2016  1:40 PM  Result Value Ref Range Status   Specimen Description BLOOD LEFT FOREARM  Final   Special Requests BOTTLES DRAWN AEROBIC AND ANAEROBIC  4CC  Final   Culture  Setup Time   Final    GRAM POSITIVE COCCI IN BOTH AEROBIC AND ANAEROBIC BOTTLES CRITICAL RESULT CALLED TO, READ BACK BY AND VERIFIED WITH: [place] AT 1610 02/27/16 CAF    Culture   Final    STREPTOCOCCUS PYOGENES IN BOTH AEROBIC AND ANAEROBIC BOTTLES    Report Status  02/29/2016 FINAL  Final   Organism ID, Bacteria STREPTOCOCCUS PYOGENES  Final      Susceptibility   Streptococcus pyogenes - MIC*    CLINDAMYCIN Value in next row Sensitive      SENSITIVE<=0.25    AMPICILLIN Value in next row Sensitive      SENSITIVE<=0.25    ERYTHROMYCIN Value in next row Sensitive      SENSITIVE0.12    VANCOMYCIN Value in next row Sensitive      SENSITIVE0.5    CEFTRIAXONE Value in next row Sensitive      SENSITIVE0.12    LEVOFLOXACIN Value in next row Sensitive      SENSITIVE0.5    * STREPTOCOCCUS PYOGENES  Blood Culture ID Panel (Reflexed)     Status: Abnormal   Collection Time: 02/25/2016  1:40 PM  Result Value Ref Range Status   Enterococcus species NOT DETECTED NOT DETECTED Final   Vancomycin resistance NOT DETECTED NOT DETECTED Final   Listeria monocytogenes NOT DETECTED NOT DETECTED Final   Staphylococcus species NOT DETECTED NOT DETECTED Final   Staphylococcus aureus NOT DETECTED NOT DETECTED Final   Methicillin resistance NOT DETECTED NOT DETECTED Final   Streptococcus species NOT DETECTED NOT DETECTED Final   Streptococcus agalactiae NOT DETECTED NOT DETECTED Final   Streptococcus pneumoniae NOT DETECTED NOT DETECTED Final   Streptococcus pyogenes DETECTED (A) NOT DETECTED Final    Comment: CRITICAL RESULT CALLED TO, READ BACK BY AND VERIFIED WITH: NATE COOKSON AT 0426 02/27/16 CAF    Acinetobacter baumannii NOT DETECTED NOT DETECTED Final   Enterobacteriaceae species NOT DETECTED NOT DETECTED Final   Enterobacter cloacae complex NOT DETECTED NOT DETECTED Final   Escherichia coli NOT DETECTED NOT DETECTED Final   Klebsiella oxytoca NOT DETECTED NOT DETECTED Final   Klebsiella pneumoniae NOT DETECTED NOT DETECTED Final   Proteus species NOT DETECTED NOT DETECTED Final   Serratia marcescens NOT DETECTED NOT DETECTED Final   Carbapenem resistance NOT DETECTED NOT DETECTED Final   Haemophilus influenzae NOT DETECTED NOT DETECTED Final   Neisseria  meningitidis NOT DETECTED NOT DETECTED Final   Pseudomonas aeruginosa NOT DETECTED NOT DETECTED Final   Candida albicans NOT DETECTED NOT DETECTED Final   Candida glabrata NOT DETECTED NOT DETECTED Final   Candida krusei NOT DETECTED NOT DETECTED Final   Candida parapsilosis NOT DETECTED  NOT DETECTED Final   Candida tropicalis NOT DETECTED NOT DETECTED Final  Body fluid culture     Status: None   Collection Time: 03/05/2016  3:05 PM  Result Value Ref Range Status   Specimen Description R Knee  Final   Special Requests NONE  Final   Gram Stain   Final    MODERATE WBC SEEN MODERATE GRAM POSITIVE COCCI IN PAIRS AND CHAINS    Culture   Final    HEAVY GROWTH GROUP A STREP (S.PYOGENES) ISOLATED There is no known Penicillin Resistant Beta Streptococcus in the [place] For patients that are Penicillin-allergic, Erythromycin is 85-94% susceptible, and Clindamycin is 80% susceptible.  Contact Microbiology within 7 days if sensitivity testing is  required.   NO ANAEROBES ISOLATED    Report Status 02/29/2016 FINAL  Final  Body fluid culture     Status: None   Collection Time: 03/13/2016  4:10 PM  Result Value Ref Range Status   Specimen Description R Knee  Final   Special Requests NONE  Final   Gram Stain   Final    MANY WBC SEEN MANY GRAM POSITIVE COCCI IN PAIRS AND CHAINS    Culture   Final    HEAVY GROWTH GROUP A STREP (S.PYOGENES) ISOLATED There is no known Penicillin Resistant Beta Streptococcus in the [place] For patients that are Penicillin-allergic, Erythromycin is 85-94% susceptible, and Clindamycin is 80% susceptible.  Contact Microbiology within 7 days if sensitivity testing is  required.   NO ANAEROBES ISOLATED    Report Status 02/29/2016 FINAL  Final  MRSA PCR Screening     Status: None   Collection Time: 02/27/16  3:35 AM  Result Value Ref Range Status   MRSA by PCR NEGATIVE NEGATIVE Final    Comment:        The GeneXpert MRSA Assay (FDA approved for NASAL specimens only), is one  component of a comprehensive MRSA colonization surveillance program. It is not intended to diagnose MRSA infection nor to guide or monitor treatment for MRSA infections.   Urine culture     Status: None   Collection Time: 02/27/16  8:30 AM  Result Value Ref Range Status   Specimen Description URINE, RANDOM  Final   Special Requests NONE  Final   Culture NO GROWTH 1 DAY  Final   Report Status 02/28/2016 FINAL  Final  Culture, blood (single) w Reflex to PCR ID Panel     Status: None   Collection Time: 02/29/16  2:36 PM  Result Value Ref Range Status   Specimen Description BLOOD LEFT HAND  Final   Special Requests BOTTLES DRAWN AEROBIC AND ANAEROBIC [place]  Final   Culture NO GROWTH 5 DAYS  Final   Report Status 03/05/2016 FINAL  Final  C difficile quick scan w PCR reflex     Status: None   Collection Time: 03/06/16  5:37 PM  Result Value Ref Range Status   C Diff antigen NEGATIVE NEGATIVE Final   C Diff toxin NEGATIVE NEGATIVE Final   C Diff interpretation Negative for C. difficile  Final  Culture, respiratory (NON-Expectorated)     Status: None (Preliminary result)   Collection Time: 03/07/16  8:20 AM  Result Value Ref Range Status   Specimen Description TRACHEAL ASPIRATE  Final   Special Requests NONE  Final   Gram Stain   Final    MODERATE WBC SEEN FEW SQUAMOUS EPITHELIAL CELLS PRESENT RARE YEAST    Culture HOLDING FOR POSSIBLE PATHOGEN  Final  Report Status PENDING  Incomplete  Culture, respiratory (NON-Expectorated)     Status: None (Preliminary result)   Collection Time: 03/08/16  4:52 PM  Result Value Ref Range Status   Specimen Description TRACHEAL ASPIRATE  Final   Special Requests NONE  Final   Gram Stain THIS SPECIMEN IS ACCEPTABLE FOR SPUTUM CULTURE  Final   Culture PENDING  Incomplete   Report Status PENDING  Incomplete    Coagulation Studies: No results for input(s): LABPROT, INR in the last 72 hours.  Urinalysis: No results for input(s):  COLORURINE, LABSPEC, PHURINE, GLUCOSEU, HGBUR, BILIRUBINUR, KETONESUR, PROTEINUR, UROBILINOGEN, NITRITE, LEUKOCYTESUR in the last 72 hours.  Invalid input(s): APPERANCEUR    Imaging: Dg Abd 1 View  03/08/2016  CLINICAL DATA:  Orogastric tube placement EXAM: ABDOMEN - 1 VIEW COMPARISON:  Portable exam 1630 hours compared to 03/03/2016 FINDINGS: Tip of nasogastric tube projects over mid stomach. Visualized bowel gas pattern normal. Tip of RIGHT femoral line projects over inferior endplate of L4. Prior T12 vertebroplasty. Bones demineralized. IMPRESSION: Tip of nasogastric tube projects over mid stomach. Electronically Signed   By: Lavonia Dana M.D.   On: 03/08/2016 18:52   Ct Head Wo Contrast  03/08/2016  CLINICAL DATA:  Failed extubation today, respiratory issues following sepsis and respiratory failure as well as NSTEMI, altered mental status question stroke, history diabetes mellitus, CHF EXAM: CT HEAD WITHOUT CONTRAST TECHNIQUE: Contiguous axial images were obtained from the base of the skull through the vertex without intravenous contrast. COMPARISON:  10/21/2015 FINDINGS: Scattered beam hardening artifacts of dental origin. Cavum septum pellucidum and vergae. Generalized atrophy. Normal ventricular morphology. No midline shift or mass effect. Otherwise normal appearance of brain parenchyma. No definite intracranial hemorrhage, mass lesion or evidence acute infarction. No extra-axial fluid collections. Extensive atherosclerotic calcifications at the carotid siphons. Chronic opacification of the RIGHT maxillary sinus with osseous thickening. Partial opacification of a few RIGHT ethmoid air cells. Skull intact. IMPRESSION: Generalized atrophy. No acute intracranial abnormalities. Chronic RIGHT maxillary sinusitis. Electronically Signed   By: Lavonia Dana M.D.   On: 03/08/2016 17:20   US Venous Img Lower Bilateral  03/08/2016  EXAM: BILATERAL LOWER EXTREMITY VENOUS DOPPLER ULTRASOUND TECHNIQUE: Gray-scale  sonography with graded compression, as well as color Doppler and duplex ultrasound were performed to evaluate the lower extremity deep venous systems from the level of the common femoral vein and including the common femoral, femoral, profunda femoral, popliteal and calf veins including the posterior tibial, peroneal and gastrocnemius veins when visible. The superficial great saphenous vein was also interrogated. Spectral Doppler was utilized to evaluate flow at rest and with distal augmentation maneuvers in the common femoral, femoral and popliteal veins. COMPARISON:  None. FINDINGS: RIGHT LOWER EXTREMITY Common Femoral Vein: No evidence of thrombus. Normal compressibility, respiratory phasicity and response to augmentation. Saphenofemoral Junction: No evidence of thrombus. Normal compressibility and flow on color Doppler imaging. Profunda Femoral Vein: No evidence of thrombus. Normal compressibility and flow on color Doppler imaging. Femoral Vein: No evidence of thrombus. Normal compressibility, respiratory phasicity and response to augmentation. Popliteal Vein: No evidence of thrombus. Normal compressibility, respiratory phasicity and response to augmentation. Calf Veins: No evidence of thrombus. Normal compressibility and flow on color Doppler imaging. Peroneal veins not visualized. Superficial Great Saphenous Vein: No evidence of thrombus. Normal compressibility and flow on color Doppler imaging. Other Findings:  None. LEFT LOWER EXTREMITY Common Femoral Vein: No evidence of thrombus. Normal compressibility, respiratory phasicity and response to augmentation. Saphenofemoral Junction: No evidence of thrombus.  Normal compressibility and flow on color Doppler imaging. Profunda Femoral Vein: No evidence of thrombus. Normal compressibility and flow on color Doppler imaging. Femoral Vein: No evidence of thrombus. Normal compressibility, respiratory phasicity and response to augmentation. Popliteal Vein: No evidence  of thrombus. Normal compressibility, respiratory phasicity and response to augmentation. Calf Veins: No evidence of thrombus. Normal compressibility and flow on color Doppler imaging. Peroneal veins not visualized. Superficial Great Saphenous Vein: No evidence of thrombus. Normal compressibility and flow on color Doppler imaging. Other Findings: Exam was limited due to lower extremity edema bilaterally. IMPRESSION: No evidence of deep venous thrombosis. Electronically Signed   By: Marcello Moores  Register   On: 03/08/2016 14:52   Dg Chest Port 1 View  03/09/2016  CLINICAL DATA:  Evaluate respiratory failure EXAM: PORTABLE CHEST 1 VIEW COMPARISON:  03/08/2016 FINDINGS: Interval placement of NG tube into the stomach. Right central line and endotracheal tube are unchanged. Bilateral perihilar and lower lobe opacities slightly improved since prior study. Small bilateral effusions suspected. Heart is borderline in size. No acute bony abnormality. IMPRESSION: Improving bilateral airspace opacities with persistent perihilar and lower lobe airspace disease. This could represent edema or infection. Small bilateral effusions. Electronically Signed   By: Rolm Baptise M.D.   On: 03/09/2016 08:52   Portable Chest Xray  03/08/2016  CLINICAL DATA:  Hypoxia EXAM: PORTABLE CHEST 1 VIEW COMPARISON:  Study obtained earlier in the day FINDINGS: The endotracheal tube tip is at the carina directed toward the right main bronchus. Central catheter tip is in the superior vena cava. No pneumothorax. There are bilateral pleural effusions with perihilar edema, slightly more on the left than on the right. Heart is mildly enlarged. There is atherosclerotic calcification in the aorta. IMPRESSION: Tube and catheter positions as described without pneumothorax. Endotracheal tube tip is at the carina directed at the right main bronchus. Advise withdrawing endotracheal tube approximately 4 cm. Evidence of congestive heart failure. Superimposed pneumonia  in the upper lobes, particularly on the left, cannot be excluded radiographically. Critical Value/emergent results were called by telephone at the time of interpretation on 03/08/2016 at 4:51 pm to Delton See, RN. who verbally acknowledged these results. Electronically Signed   By: Lowella Grip III M.D.   On: 03/08/2016 16:52   Dg Chest Port 1 View  03/08/2016  CLINICAL DATA:  Respiratory failure, acute renal failure, NSTEMI, septic shock. EXAM: PORTABLE CHEST 1 VIEW COMPARISON:  Portable chest x-ray of March 07, 2016 FINDINGS: The lung volumes remain low. Bibasilar densities persists with obscuration of the hemidiaphragm. The heart is top-normal in size. The central pulmonary vascularity is mildly prominent. The internal jugular venous catheter on the right projects over the proximal SVC. The endotracheal tube tip lies 3.2 cm above the carina. The esophagogastric tube tip in proximal port are "Within a hiatal hernia or partially intra thoracic stomach. The patient has undergone previous CABG. IMPRESSION: Stable appearance of the chest. Bibasilar atelectasis or pneumonia with small bilateral pleural effusions. Mild pulmonary interstitial edema, improved. Electronically Signed   By: David  [place] M.D.   On: 03/08/2016 07:30     Medications:   . alteplase    . dexmedetomidine 0.4 mcg/kg/hr (03/09/16 0853)  . phenylephrine (NEO-SYNEPHRINE) Adult infusion Stopped (03/09/16 0010)  . pureflow     . albumin human  12.5 g Intravenous 3 times per day  . antiseptic oral rinse  7 mL Mouth Rinse QID  . aspirin  325 mg Per Tube Daily  . budesonide (PULMICORT) nebulizer solution  0.25 mg Nebulization BID  . chlorhexidine gluconate (SAGE KIT)  15 mL Mouth Rinse BID  . feeding supplement (VITAL HIGH PROTEIN)  1,000 mL Per Tube Q24H  . insulin aspart  0-15 Units Subcutaneous 6 times per day  . ipratropium-albuterol  3 mL Nebulization Q6H  . pantoprazole (PROTONIX) IV  40 mg Intravenous Q24H  . pencillin  G potassium IV  3 Million Units Intravenous 4 times per day  . pravastatin  40 mg Per Tube q1800  . sodium chloride flush  3 mL Intravenous Q12H   acetaminophen **OR** [DISCONTINUED] acetaminophen, albuterol, alteplase, fentaNYL (SUBLIMAZE) injection, heparin, midazolam, ondansetron **OR** ondansetron (ZOFRAN) IV, sodium chloride flush  Assessment/ Plan:  75 y.o. Caucasian male  with significant medical history of coronary disease with four-vessel CABG in 1998, gallbladder rupture, extensive surgery, Diabetes with complications of retinopathy, peripheral neuropathy, peripheral vascular disease, Aorto iliac bypass, angioplasty and stent in his left leg, kyphoplasty for chronic back pain, CKD st 3 with Baseline Cr 1.5/ GFR 46  1. 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 Rt Knee septic arthritis  3. NSTEMI 4. DM-2 with CKD 5. Hypotension 6. Acute resp faliure. Intubated 4/3, extubated and re-intubated 4/13 7. Hypokalemia 8. Hyponatremia 9. Septic shock.(strep pyogenes)  10. Metabolic Acidosis. 11. Generalized edema  Plan: Urine output remains low. However overall the patient has significant volume on board. We will continue to perform continuous renal replacement therapy with ultrafiltration rate to 100-125 cc per hour. Continue to monitor serum electrolytes. We remain hopeful that his renal function will slowly improve over time. Continue ventilatory support under the instruction of pulmonary critical care. - CRRT today, 4 K dialysate, UF goal 100/hr - iv albumin support - Trach / PEG planned for Monday.    LOS: 12 Gwendolyne Welford 4/14/20179:40 AM

## 2016-03-10 ENCOUNTER — Inpatient Hospital Stay: Payer: Commercial Managed Care - HMO

## 2016-03-10 LAB — RENAL FUNCTION PANEL
ALBUMIN: 1.6 g/dL — AB (ref 3.5–5.0)
ALBUMIN: 1.7 g/dL — AB (ref 3.5–5.0)
ANION GAP: 6 (ref 5–15)
Albumin: 1.4 g/dL — ABNORMAL LOW (ref 3.5–5.0)
Albumin: 1.7 g/dL — ABNORMAL LOW (ref 3.5–5.0)
Anion gap: 3 — ABNORMAL LOW (ref 5–15)
Anion gap: 3 — ABNORMAL LOW (ref 5–15)
Anion gap: 4 — ABNORMAL LOW (ref 5–15)
BUN: 26 mg/dL — ABNORMAL HIGH (ref 6–20)
BUN: 27 mg/dL — ABNORMAL HIGH (ref 6–20)
BUN: 30 mg/dL — AB (ref 6–20)
BUN: 32 mg/dL — ABNORMAL HIGH (ref 6–20)
CALCIUM: 7.7 mg/dL — AB (ref 8.9–10.3)
CALCIUM: 7.6 mg/dL — AB (ref 8.9–10.3)
CHLORIDE: 104 mmol/L (ref 101–111)
CHLORIDE: 106 mmol/L (ref 101–111)
CO2: 26 mmol/L (ref 22–32)
CO2: 26 mmol/L (ref 22–32)
CO2: 28 mmol/L (ref 22–32)
CO2: 27 mmol/L (ref 22–32)
CREATININE: 0.69 mg/dL (ref 0.61–1.24)
CREATININE: 0.73 mg/dL (ref 0.61–1.24)
CREATININE: 0.71 mg/dL (ref 0.61–1.24)
CREATININE: 0.77 mg/dL (ref 0.61–1.24)
Calcium: 8.2 mg/dL — ABNORMAL LOW (ref 8.9–10.3)
Calcium: 8.2 mg/dL — ABNORMAL LOW (ref 8.9–10.3)
Chloride: 104 mmol/L (ref 101–111)
Chloride: 107 mmol/L (ref 101–111)
GFR calc Af Amer: >60 mL/min (ref >60)
GFR calc Af Amer: >60 mL/min (ref >60)
GFR calc non Af Amer: >60 mL/min (ref >60)
GFR calc non Af Amer: >60 mL/min (ref >60)
GLUCOSE: 250 mg/dL — AB (ref 65–99)
Glucose, Bld: 207 mg/dL — ABNORMAL HIGH (ref 65–99)
Glucose, Bld: 225 mg/dL — ABNORMAL HIGH (ref 65–99)
Glucose, Bld: 192 mg/dL — ABNORMAL HIGH (ref 65–99)
PHOSPHORUS: 2.7 mg/dL (ref 2.5–4.6)
PHOSPHORUS: 2.6 mg/dL (ref 2.5–4.6)
POTASSIUM: 4.7 mmol/L (ref 3.5–5.1)
Phosphorus: 3 mg/dL (ref 2.5–4.6)
Phosphorus: 2.9 mg/dL (ref 2.5–4.6)
Potassium: 4.7 mmol/L (ref 3.5–5.1)
Potassium: 4.5 mmol/L (ref 3.5–5.1)
Potassium: 4.5 mmol/L (ref 3.5–5.1)
SODIUM: 133 mmol/L — AB (ref 135–145)
SODIUM: 138 mmol/L (ref 135–145)
Sodium: 136 mmol/L (ref 135–145)
Sodium: 137 mmol/L (ref 135–145)

## 2016-03-10 LAB — CULTURE, RESPIRATORY
CULTURE: NORMAL
GRAM STAIN: NONE SEEN

## 2016-03-10 LAB — COMPREHENSIVE METABOLIC PANEL
ALBUMIN: 1.4 g/dL — AB (ref 3.5–5.0)
ALK PHOS: 93 U/L (ref 38–126)
ALT: 21 U/L (ref 17–63)
AST: 39 U/L (ref 15–41)
Anion gap: 2 — ABNORMAL LOW (ref 5–15)
BUN: 27 mg/dL — ABNORMAL HIGH (ref 6–20)
CALCIUM: 7.7 mg/dL — AB (ref 8.9–10.3)
CO2: 27 mmol/L (ref 22–32)
CREATININE: 0.68 mg/dL (ref 0.61–1.24)
Chloride: 107 mmol/L (ref 101–111)
GFR calc non Af Amer: >60 mL/min (ref >60)
GLUCOSE: 209 mg/dL — AB (ref 65–99)
Potassium: 4.7 mmol/L (ref 3.5–5.1)
SODIUM: 136 mmol/L (ref 135–145)
Total Bilirubin: 0.6 mg/dL (ref 0.3–1.2)
Total Protein: 5.4 g/dL — ABNORMAL LOW (ref 6.5–8.1)

## 2016-03-10 LAB — CBC
HCT: 22 % — ABNORMAL LOW (ref 40.0–52.0)
HEMOGLOBIN: 7.3 g/dL — AB (ref 13.0–18.0)
MCH: 31.1 pg (ref 26.0–34.0)
MCHC: 33.2 g/dL (ref 32.0–36.0)
MCV: 93.5 fL (ref 80.0–100.0)
PLATELETS: 75 10*3/uL — AB (ref 150–440)
RBC: 2.35 MIL/uL — AB (ref 4.40–5.90)
RDW: 15 % — ABNORMAL HIGH (ref 11.5–14.5)
WBC: 7.8 10*3/uL (ref 3.8–10.6)

## 2016-03-10 LAB — MAGNESIUM
Magnesium: 1.6 mg/dL — ABNORMAL LOW (ref 1.7–2.4)
Magnesium: 1.7 mg/dL (ref 1.7–2.4)

## 2016-03-10 LAB — GLUCOSE, CAPILLARY
GLUCOSE-CAPILLARY: 169 mg/dL — AB (ref 65–99)
GLUCOSE-CAPILLARY: 198 mg/dL — AB (ref 65–99)
GLUCOSE-CAPILLARY: 200 mg/dL — AB (ref 65–99)
Glucose-Capillary: 209 mg/dL — ABNORMAL HIGH (ref 65–99)
Glucose-Capillary: 161 mg/dL — ABNORMAL HIGH (ref 65–99)
Glucose-Capillary: 241 mg/dL — ABNORMAL HIGH (ref 65–99)

## 2016-03-10 LAB — CK: Total CK: 25 U/L — ABNORMAL LOW (ref 49–397)

## 2016-03-10 LAB — CULTURE, RESPIRATORY W GRAM STAIN

## 2016-03-10 LAB — PROCALCITONIN: PROCALCITONIN: 2.35 ng/mL

## 2016-03-10 LAB — BRAIN NATRIURETIC PEPTIDE: B Natriuretic Peptide: 2587 pg/mL — ABNORMAL HIGH (ref 0.0–100.0)

## 2016-03-10 LAB — PHOSPHORUS: Phosphorus: 3.1 mg/dL (ref 2.5–4.6)

## 2016-03-10 NOTE — Progress Notes (Signed)
PULMONARY / CRITICAL CARE MEDICINE   Name: John Schultz MRN: [id number] DOB: [birth year]    ADMISSION DATE:  03/15/2016  BRIEF HISTORY: 75 year old male past medical history of hypertension, chronic diastolic heart failure, CK, diabetes, presented on April 2 with four-day history of progressive right knee pain fever, shortness of breath, cough. Within 24 hours decompensated requiring intubation on 04/03.  Seen by orthopedics, had drain placed in right knee with purulent material. Now with mechanical ventilation and unable to weaned off after failed extubation 04/13 2/2  stridor and poor cough.   SUBJECTIVE:  No acute issues overnight except for transient bradycardia.On precedex gtt at 0.2.  Remains on full vent support and CRRT.   SIGNIFICANT EVENTS/STUDIES: 04/02 admitted by Hospitalist Service with 4 day history of right knee pain - swollen, erythematous and tender on exam - and severe sepsis, AKI, abnormal EKG. Cardiac markers positive (peak troponin I 8.10) 04/03 Orthopedics consultation and aspiration of R Knee, with drain placement 04/03 ID consultation 04/03 Nephrology consultation 04/04 TTE: LVEF 60-65%, no vegetations 04/04 altered mental status, worsening respiratory status, septic, intubated 04/04 R knee hemovac removed by ortho 04/06 Cardiology consultation: NSTEMI - deemed secondary to demand ischemia. Further W/U to be considered after resolution of severe sepsis and critical illness 04/09 CRRT initiated  04/10 agitated on WUA. Failed SBT. Off and on norepienphrine 04/11 Tolerating volume removal by CRRT. Failed SBT. Tolerated PS 10-14 cm H2O 04/12 Failed SBT. Agitated on WUA. Uo improving. Started back on fentanyl gtt in PM 04/13 LE venous US:  04/13 Passed SBT. Cognition remains marginal. Extubated. Post extubation stridor and poor cough. BiPAP initiated. High risk of re-intubation 04/13 Re-intubated in afternoon. ENT consult for trach tube placement - planned 04/17.   04/13 CT head: diffuse atrophy, no acute disease 04/14 CRRT resumed due to hypervolemia  INDWELLING DEVICES:: R IJ CVL 04/03 >>  ETT 04/03 >> 04/13, 04/13 >>  R femoral HD cath 04/09 >>   MICRO DATA: 04/02 MRSA PCR >> NEG 04/02 flu screen >> NEG 04/02 blood >> Strep pyogenes 04/02 knee aspirate >> Strep pyogenes 04/05 blood >> NEG 04/11 C diff >> NEG 04/12 Resp >> NOF 04/13 Resp >>   PCT algorithm 04/03: 22.50 > 16.65 > 7.12 PCT algorithm 04/12: 0.62 > 0.57 > 2.66  ANTIMICROBIALS:  Ceftriaxone 04/02 X 1  Vanc 04/02 >> 04/03 Pip-tazo 04/02 >> 04/03 Clinda 04/03 >> 04/07 Pen G 04/03 >>    VITAL SIGNS: Temp:  [93.7 F (34.3 C)-99.1 F (37.3 C)] 96.6 F (35.9 C) (04/15 0600) Pulse Rate:  [50-110] 58 (04/15 0600) Resp:  [0-26] 25 (04/15 0600) BP: (84-146)/(45-84) 84/51 mmHg (04/15 0600) SpO2:  [100 %] 100 % (04/15 0600) FiO2 (%):  [35 %] 35 % (04/15 0217) Weight:  [235 lb 7.2 oz (106.8 kg)] 235 lb 7.2 oz (106.8 kg) (04/15 0300) HEMODYNAMICS:   VENTILATOR SETTINGS: Vent Mode:  [-] PRVC FiO2 (%):  [35 %] 35 % Set Rate:  [16 bmp] 16 bmp Vt Set:  [500 mL] 500 mL PEEP:  [5 cmH20] 5 cmH20 INTAKE / OUTPUT:  Intake/Output Summary (Last 24 hours) at 03/10/16 0730 Last data filed at 03/10/16 [id number]  Gross per 24 hour  Intake 1660.33 ml  Output   3066 ml  Net -1405.67 ml    Review of Systems  Unable to perform ROS: intubated    Physical Exam  Constitutional:  RASS -3, not F/C  HENT:  Head: Normocephalic and atraumatic.  Eyes: EOM are  normal. Pupils are equal, round, and reactive to light.  Cardiovascular: Regular rhythm and normal heart sounds.   No murmur heard. Bradycardiac at 54 bpm  Pulmonary/Chest: No respiratory distress. He has no wheezes. He has no rales.  Abdominal: Soft. Bowel sounds are normal. He exhibits no distension. There is no tenderness.  Musculoskeletal:  R>L LE edema  Neurological: He has normal reflexes.  Moves all extremities      LABS:  CBC  Recent Labs Lab 03/08/16 0630 03/09/16 0419 03/10/16 0426  WBC 14.5* 10.1 7.8  HGB 8.4* 7.4* 7.3*  HCT 25.5* 22.0* 22.0*  PLT 85* 84* 75*   Coag's No results for input(s): APTT, INR in the last 168 hours. BMET  Recent Labs Lab 03/09/16 2108 03/10/16 0308 03/10/16 0426  NA 137 136 136  K 4.6 4.7 4.7  CL 107 104 107  CO2 27 26 27   BUN 29* 26* 27*  CREATININE 0.81 0.69 0.68  GLUCOSE 208* 207* 209*   Electrolytes  Recent Labs Lab 03/08/16 0630  03/09/16 1448 03/09/16 2108 03/10/16 0308 03/10/16 0426  CALCIUM 7.9*  < > 7.5* 7.7* 7.7* 7.7*  MG 1.6*  --  1.7  --   --  1.6*  PHOS 2.2*  --  3.5 3.5 3.0 3.1  < > = values in this interval not displayed. Sepsis Markers  Recent Labs Lab 03/08/16 0630 03/09/16 0419 03/10/16 0426  PROCALCITON 0.57 2.66 2.35   ABG  Recent Labs Lab 03/03/16 1053  PHART 7.47*  PCO2ART 31*  PO2ART 80*   Liver Enzymes  Recent Labs Lab 03/06/16 0513  03/09/16 2108 03/10/16 0308 03/10/16 0426  AST 48*  --   --   --  39  ALT 28  --   --   --  21  ALKPHOS 77  --   --   --  93  BILITOT 0.6  --   --   --  0.6  ALBUMIN 1.3*  1.4*  < > 1.3* 1.4* 1.4*  < > = values in this interval not displayed. Cardiac Enzymes  Recent Labs Lab 03/09/16 0419  TROPONINI 1.05*   Glucose  Recent Labs Lab 03/09/16 1109 03/09/16 1149 03/09/16 1620 03/09/16 1926 03/09/16 2324 03/10/16 0426  GLUCAP 122* 155* 143* 172* 190* 209*    CXR: Improving bilateral airspace opacities with persistent perihilar and lower lobe airspace disease   ASSESSMENT / PLAN:  PULMONARY Acute vent dep respiratory failure Pulm edema pattern on CXR Failed extubation 04/13 P: Cont vent support - settings reviewed and/or adjusted Cont vent bundle Daily SBT and sedation vacation; low probability for optimal SBT/PSV trials ENT plans trach tube o4/17   CARDIOVASCULAR Septic shock, resolved NSTEMI - Cardiology has seen PAF P:  MAP  goal > 60 mmHg PRN phenylephrine Cont ASA and statin Further cardiac w/u after critical illness resolves per caardiology  RENAL AKI Severe hypervolemia P: Monitor BMET intermittently Monitor I/Os Correct electrolytes as indicated Renal Service following CRRT  resumed 04/14; continue per nephroollogy  GASTROINTESTINAL Diarrhea  Hemoccult positive  P: SUP: enteral famotidine Cont TFs Monitor for bleeding  HEMATOLOGIC Anemia without overt bleeding Thrombocytopenia  P: DVT px: SCDs (SQ heparin DC'd 04/12) Monitor CBC intermittently Transfuse per usual guidelines  INFECTIOUS Septic arthritis of the R knee Purulent ET secretions - much improved Small increase in PCT 04/14 - unclear significance P: Monitor temp, WBC count Micro and abx as above ID service following Recheck PCT 04/15  ENDOCRINE DM 2, controlled Hyperglycemia  improved P: Cont Lantus Cont SSI  NEUROLOGIC ICU acquired delirium - hypoactive Profound deconditioning P: RASS goal: 0, -1 Precedex initiated 04/14; continue to titrate to RASS goal Monitor for bradycardia PRN fentanyl Willl need extensive rehab if he survives this acute phase of illness   Disposition and family update: No family at bedside during rounds on 04/15  CCM time: 38 mins  Magdalene S. Tukov ANP-BC Pulmonary and Critical Care Medicine [facility] Pager [phone]  The patient was seen and examined and I informed later the assessment and plan with the nurse practitioner. I agree with the assessment and plan as indicated above. We'll continue CRRT today, patient does not yet appear appropriate for extubation, we'll continue vent support. Marda Stalker M.D.  03/10/2016 Critical Care Attestation.  I have personally obtained a history, examined the patient, evaluated laboratory and imaging results, formulated the assessment and plan and placed orders. The Patient requires high complexity decision making for  assessment and support, frequent evaluation and titration of therapies, application of advanced monitoring technologies and extensive interpretation of multiple databases. The patient has critical illness that could lead imminently to failure of 1 or more organ systems and requires the highest level of physician preparedness to intervene.  Critical Care Time devoted to patient care services described in this note is 35 minutes and is exclusive of time spent in procedures.

## 2016-03-10 NOTE — Progress Notes (Signed)
[place] NOTE  Pharmacy Consult for CRRT Medication Review   Allergies  Allergen Reactions  . Tape Rash    Patient Measurements: Height: 6' (182.9 cm) Weight: 235 lb 7.2 oz (106.8 kg) IBW/kg (Calculated) : 77.6  Vital Signs: Temp: 96.6 F (35.9 C) (04/15 0600) Temp Source: Rectal (04/15 0100) BP: 84/51 mmHg (04/15 0600) Pulse Rate: 58 (04/15 0600) Intake/Output from previous day: 04/14 0701 - 04/15 0700 In: 1660.3 [I.V.:390.3; NG/GT:720; IV Piggyback:550] Out: 3066 [Urine:1085] Intake/Output from this shift:   Vent settings for last 24 hours: Vent Mode:  [-] PRVC FiO2 (%):  [28 %-35 %] 28 % Set Rate:  [16 bmp] 16 bmp Vt Set:  [500 mL] 500 mL PEEP:  [5 cmH20] 5 cmH20  Labs:  Recent Labs  03/08/16 0630 03/09/16 0419 03/09/16 1448 03/09/16 2108 03/10/16 0308 03/10/16 0426  WBC 14.5* 10.1  --   --   --  7.8  HGB 8.4* 7.4*  --   --   --  7.3*  HCT 25.5* 22.0*  --   --   --  22.0*  PLT 85* 84*  --   --   --  75*  CREATININE 0.47* 0.78 0.89 0.81 0.69 0.68  MG 1.6*  --  1.7  --   --  1.6*  PHOS 2.2*  --  3.5 3.5 3.0 3.1  ALBUMIN 1.2*  --  1.4* 1.3* 1.4* 1.4*  PROT  --   --   --   --   --  5.4*  AST  --   --   --   --   --  39  ALT  --   --   --   --   --  21  ALKPHOS  --   --   --   --   --  93  BILITOT  --   --   --   --   --  0.6   Estimated Creatinine Clearance: 103.9 mL/min (by C-G formula based on Cr of 0.68).   Recent Labs  03/09/16 2324 03/10/16 0426 03/10/16 0735  GLUCAP 190* 209* 161*    Microbiology: Recent Results (from the past 720 hour(s))  Blood culture (routine x 2)     Status: Abnormal   Collection Time: 03/17/2016 12:59 PM  Result Value Ref Range Status   Specimen Description BLOOD LEFT FOREARM  Final   Special Requests BOTTLES DRAWN AEROBIC AND ANAEROBIC  1CC  Final   Culture  Setup Time   Final    GRAM POSITIVE COCCI AEROBIC BOTTLE ONLY CRITICAL VALUE NOTED.  VALUE IS CONSISTENT WITH PREVIOUSLY REPORTED  AND CALLED VALUE.    Culture STREPTOCOCCUS PYOGENES AEROBIC BOTTLE ONLY  (A)  Final   Report Status 03/03/2016 FINAL  Final   Organism ID, Bacteria STREPTOCOCCUS PYOGENES  Final      Susceptibility   Streptococcus pyogenes - MIC*    CLINDAMYCIN Value in next row Sensitive      SENSITIVE0.25    AMPICILLIN Value in next row Sensitive      SENSITIVE0.25    ERYTHROMYCIN Value in next row Sensitive      SENSITIVE0.12    VANCOMYCIN Value in next row Sensitive      SENSITIVE0.5    CEFTRIAXONE Value in next row Sensitive      SENSITIVE0.12    LEVOFLOXACIN Value in next row Sensitive      SENSITIVE0.5    * STREPTOCOCCUS PYOGENES  Rapid Influenza A&B  Antigens ([place] only)     Status: None   Collection Time: 02/27/2016  1:00 PM  Result Value Ref Range Status   Influenza A ([place]) NEGATIVE NEGATIVE Final   Influenza B ([facility]) NEGATIVE NEGATIVE Final  Blood culture (routine x 2)     Status: None   Collection Time: 02/29/2016  1:40 PM  Result Value Ref Range Status   Specimen Description BLOOD LEFT FOREARM  Final   Special Requests BOTTLES DRAWN AEROBIC AND ANAEROBIC  4CC  Final   Culture  Setup Time   Final    GRAM POSITIVE COCCI IN BOTH AEROBIC AND ANAEROBIC BOTTLES CRITICAL RESULT CALLED TO, READ BACK BY AND VERIFIED WITH: [place] AT 6333 02/27/16 CAF    Culture   Final    STREPTOCOCCUS PYOGENES IN BOTH AEROBIC AND ANAEROBIC BOTTLES    Report Status 02/29/2016 FINAL  Final   Organism ID, Bacteria STREPTOCOCCUS PYOGENES  Final      Susceptibility   Streptococcus pyogenes - MIC*    CLINDAMYCIN Value in next row Sensitive      SENSITIVE<=0.25    AMPICILLIN Value in next row Sensitive      SENSITIVE<=0.25    ERYTHROMYCIN Value in next row Sensitive      SENSITIVE0.12    VANCOMYCIN Value in next row Sensitive      SENSITIVE0.5    CEFTRIAXONE Value in next row Sensitive      SENSITIVE0.12    LEVOFLOXACIN Value in next row Sensitive      SENSITIVE0.5    * STREPTOCOCCUS PYOGENES   Blood Culture ID Panel (Reflexed)     Status: Abnormal   Collection Time: 03/09/2016  1:40 PM  Result Value Ref Range Status   Enterococcus species NOT DETECTED NOT DETECTED Final   Vancomycin resistance NOT DETECTED NOT DETECTED Final   Listeria monocytogenes NOT DETECTED NOT DETECTED Final   Staphylococcus species NOT DETECTED NOT DETECTED Final   Staphylococcus aureus NOT DETECTED NOT DETECTED Final   Methicillin resistance NOT DETECTED NOT DETECTED Final   Streptococcus species NOT DETECTED NOT DETECTED Final   Streptococcus agalactiae NOT DETECTED NOT DETECTED Final   Streptococcus pneumoniae NOT DETECTED NOT DETECTED Final   Streptococcus pyogenes DETECTED (A) NOT DETECTED Final    Comment: CRITICAL RESULT CALLED TO, READ BACK BY AND VERIFIED WITH: NATE COOKSON AT 0426 02/27/16 CAF    Acinetobacter baumannii NOT DETECTED NOT DETECTED Final   Enterobacteriaceae species NOT DETECTED NOT DETECTED Final   Enterobacter cloacae complex NOT DETECTED NOT DETECTED Final   Escherichia coli NOT DETECTED NOT DETECTED Final   Klebsiella oxytoca NOT DETECTED NOT DETECTED Final   Klebsiella pneumoniae NOT DETECTED NOT DETECTED Final   Proteus species NOT DETECTED NOT DETECTED Final   Serratia marcescens NOT DETECTED NOT DETECTED Final   Carbapenem resistance NOT DETECTED NOT DETECTED Final   Haemophilus influenzae NOT DETECTED NOT DETECTED Final   Neisseria meningitidis NOT DETECTED NOT DETECTED Final   Pseudomonas aeruginosa NOT DETECTED NOT DETECTED Final   Candida albicans NOT DETECTED NOT DETECTED Final   Candida glabrata NOT DETECTED NOT DETECTED Final   Candida krusei NOT DETECTED NOT DETECTED Final   Candida parapsilosis NOT DETECTED NOT DETECTED Final   Candida tropicalis NOT DETECTED NOT DETECTED Final  Body fluid culture     Status: None   Collection Time: 02/25/2016  3:05 PM  Result Value Ref Range Status   Specimen Description R Knee  Final   Special Requests NONE  Final  Gram  Stain   Final    MODERATE WBC SEEN MODERATE GRAM POSITIVE COCCI IN PAIRS AND CHAINS    Culture   Final    HEAVY GROWTH GROUP A STREP (S.PYOGENES) ISOLATED There is no known Penicillin Resistant Beta Streptococcus in the [place] For patients that are Penicillin-allergic, Erythromycin is 85-94% susceptible, and Clindamycin is 80% susceptible.  Contact Microbiology within 7 days if sensitivity testing is  required.   NO ANAEROBES ISOLATED    Report Status 02/29/2016 FINAL  Final  Body fluid culture     Status: None   Collection Time: 02/27/2016  4:10 PM  Result Value Ref Range Status   Specimen Description R Knee  Final   Special Requests NONE  Final   Gram Stain   Final    MANY WBC SEEN MANY GRAM POSITIVE COCCI IN PAIRS AND CHAINS    Culture   Final    HEAVY GROWTH GROUP A STREP (S.PYOGENES) ISOLATED There is no known Penicillin Resistant Beta Streptococcus in the [place] For patients that are Penicillin-allergic, Erythromycin is 85-94% susceptible, and Clindamycin is 80% susceptible.  Contact Microbiology within 7 days if sensitivity testing is  required.   NO ANAEROBES ISOLATED    Report Status 02/29/2016 FINAL  Final  MRSA PCR Screening     Status: None   Collection Time: 02/27/16  3:35 AM  Result Value Ref Range Status   MRSA by PCR NEGATIVE NEGATIVE Final    Comment:        The GeneXpert MRSA Assay (FDA approved for NASAL specimens only), is one component of a comprehensive MRSA colonization surveillance program. It is not intended to diagnose MRSA infection nor to guide or monitor treatment for MRSA infections.   Urine culture     Status: None   Collection Time: 02/27/16  8:30 AM  Result Value Ref Range Status   Specimen Description URINE, RANDOM  Final   Special Requests NONE  Final   Culture NO GROWTH 1 DAY  Final   Report Status 02/28/2016 FINAL  Final  Culture, blood (single) w Reflex to PCR ID Panel     Status: None   Collection Time: 02/29/16  2:36 PM  Result  Value Ref Range Status   Specimen Description BLOOD LEFT HAND  Final   Special Requests BOTTLES DRAWN AEROBIC AND ANAEROBIC New Site  Final   Culture NO GROWTH 5 DAYS  Final   Report Status 03/05/2016 FINAL  Final  C difficile quick scan w PCR reflex     Status: None   Collection Time: 03/06/16  5:37 PM  Result Value Ref Range Status   C Diff antigen NEGATIVE NEGATIVE Final   C Diff toxin NEGATIVE NEGATIVE Final   C Diff interpretation Negative for C. difficile  Final  Culture, respiratory (NON-Expectorated)     Status: None   Collection Time: 03/07/16  8:20 AM  Result Value Ref Range Status   Specimen Description TRACHEAL ASPIRATE  Final   Special Requests NONE  Final   Gram Stain   Final    MODERATE WBC SEEN FEW SQUAMOUS EPITHELIAL CELLS PRESENT RARE YEAST    Culture Consistent with normal respiratory flora.  Final   Report Status 03/09/2016 FINAL  Final  Culture, respiratory (NON-Expectorated)     Status: None (Preliminary result)   Collection Time: 03/08/16  4:52 PM  Result Value Ref Range Status   Specimen Description TRACHEAL ASPIRATE  Final   Special Requests NONE  Final   Gram Stain NO  ORGANISMS SEEN  Final   Culture Consistent with normal respiratory flora.  Final   Report Status PENDING  Incomplete    Medications:  Scheduled:  . albumin human  12.5 g Intravenous 3 times per day  . antiseptic oral rinse  7 mL Mouth Rinse QID  . aspirin  325 mg Per Tube Daily  . budesonide (PULMICORT) nebulizer solution  0.25 mg Nebulization BID  . chlorhexidine gluconate (SAGE KIT)  15 mL Mouth Rinse BID  . famotidine  20 mg Per Tube Daily  . feeding supplement (PRO-STAT SUGAR FREE 64)  30 mL Oral QID  . insulin aspart  0-15 Units Subcutaneous 6 times per day  . ipratropium-albuterol  3 mL Nebulization Q6H  . pencillin G potassium IV  3 Million Units Intravenous 4 times per day  . pravastatin  40 mg Per Tube q1800  . sodium chloride flush  3 mL Intravenous Q12H    Infusions:  . alteplase    . dexmedetomidine 0.2 mcg/kg/hr (03/10/16 0029)  . feeding supplement (VITAL AF 1.2 CAL) 1,000 mL (03/10/16 0405)  . phenylephrine (NEO-SYNEPHRINE) Adult infusion 27 mcg/min (03/10/16 0635)  . pureflow 2,000 mL/hr at 03/10/16 8473    Assessment: 75 y/o M admitted with septic shock now requiring CRRT. Pharmacy consulted to adjust medication dosing for CRRT.   Plan:  No medications require adjustment at present.   Pharmacy will continue to monitor and adjust per consult.   [place] Clinical Pharmacist 03/10/2016,8:26 AM

## 2016-03-10 NOTE — Progress Notes (Signed)
LCSW reconnected with John Schultz and her son was introduced. LCSW inquired on husbands prognosis and she explained that he is unable to breathe on his own. He will be getting a trachea on Monday and is not out of the woods yet. LCSW offered support and will check back later.  [employer] LCSW [phone]

## 2016-03-10 NOTE — Progress Notes (Deleted)
RN spoke to Dr Lorenso Courier, states to take patient PADs off and she will be here to see him later possibly to discharge.

## 2016-03-10 NOTE — Progress Notes (Signed)
Patient continues on ventilator, settings unchanged.  Sedated on precedex gtt, continues on neo gtt for map of 65.  CRRT without alarms this shift.  Tube feeding continues at goal.  Foley in place, out put total of 382ml this shift.  Family at bedside for most of shift.  Patient currently in no apparent distress, RN continue to monitor.

## 2016-03-10 NOTE — Progress Notes (Signed)
Subjective:  Patient seen at bedside. UOP > 1000 cc Had to be re-intubated 4/14 due to respiratory distress CRRT restarted 4/14 Tolerating well. BFR 250 Now requiring pressors- Neo @ 30   Objective:  Vital signs in last 24 hours:  Temp:  [93.7 F (34.3 C)-99.1 F (37.3 C)] 98.1 F (36.7 C) (04/15 0800) Pulse Rate:  [50-97] 74 (04/15 0800) Resp:  [0-28] 28 (04/15 0800) BP: (84-146)/(45-84) 96/57 mmHg (04/15 0800) SpO2:  [100 %] 100 % (04/15 0800) FiO2 (%):  [28 %-35 %] 28 % (04/15 0729) Weight:  [106.8 kg (235 lb 7.2 oz)] 106.8 kg (235 lb 7.2 oz) (04/15 0300)  Weight change: 2.8 kg (6 lb 2.8 oz) Filed Weights   03/08/16 1330 03/09/16 0420 03/10/16 0300  Weight: 104 kg (229 lb 4.5 oz) 105.5 kg (232 lb 9.4 oz) 106.8 kg (235 lb 7.2 oz)    Intake/Output:    Intake/Output Summary (Last 24 hours) at 03/10/16 0930 Last data filed at 03/10/16 6553  Gross per 24 hour  Intake 1660.33 ml  Output   2941 ml  Net -1280.67 ml     Physical Exam: General: Critically ill appearing   HEENT ETT  Neck supple  Pulm/lungs Vent assisted, bilateral rhonchi  CVS/Heart Regular, tachycardic, soft systolic murmur  Abdomen:  Soft, non distended, BS present  Extremities: ++ peripheral edema,    Neurologic: Sedated,  Not following commands  Skin: Heel skin breakdown  GU: Foley       Basic Metabolic Panel:   Recent Labs Lab 03/07/16 2259 03/08/16 0244 03/08/16 0630 03/09/16 0419 03/09/16 1448 03/09/16 2108 03/10/16 0308 03/10/16 0426  NA 137 138 138 139 138 137 136 136  K 3.9 4.3 4.4 4.4 4.5 4.6 4.7 4.7  CL 108 108 108 108 108 107 104 107  CO2 28 30 29 28 25 27 26 27   GLUCOSE 66 104* 113* 117* 151* 208* 207* 209*  BUN 26* 27* 26* 31* 31* 29* 26* 27*  CREATININE 0.49* 0.47* 0.47* 0.78 0.89 0.81 0.69 0.68  CALCIUM 7.8* 7.9* 7.9* 7.4* 7.5* 7.7* 7.7* 7.7*  MG 1.6* 1.6* 1.6*  --  1.7  --   --  1.6*  PHOS 1.9* 2.0* 2.2*  --  3.5 3.5 3.0 3.1     CBC:  Recent Labs Lab  03/06/16 0513 03/07/16 0543 03/08/16 0630 03/09/16 0419 03/10/16 0426  WBC 11.6* 15.9* 14.5* 10.1 7.8  HGB 8.5* 9.0* 8.4* 7.4* 7.3*  HCT 25.5* 27.1* 25.5* 22.0* 22.0*  MCV 89.7 89.7 90.9 92.9 93.5  PLT 81* 88* 85* 84* 75*      Microbiology:  Recent Results (from the past 720 hour(s))  Blood culture (routine x 2)     Status: Abnormal   Collection Time: 03/16/2016 12:59 PM  Result Value Ref Range Status   Specimen Description BLOOD LEFT FOREARM  Final   Special Requests BOTTLES DRAWN AEROBIC AND ANAEROBIC  1CC  Final   Culture  Setup Time   Final    GRAM POSITIVE COCCI AEROBIC BOTTLE ONLY CRITICAL VALUE NOTED.  VALUE IS CONSISTENT WITH PREVIOUSLY REPORTED AND CALLED VALUE.    Culture STREPTOCOCCUS PYOGENES AEROBIC BOTTLE ONLY  (A)  Final   Report Status 03/03/2016 FINAL  Final   Organism ID, Bacteria STREPTOCOCCUS PYOGENES  Final      Susceptibility   Streptococcus pyogenes - MIC*    CLINDAMYCIN Value in next row Sensitive      SENSITIVE0.25    AMPICILLIN Value in next row  Sensitive      SENSITIVE0.25    ERYTHROMYCIN Value in next row Sensitive      SENSITIVE0.12    VANCOMYCIN Value in next row Sensitive      SENSITIVE0.5    CEFTRIAXONE Value in next row Sensitive      SENSITIVE0.12    LEVOFLOXACIN Value in next row Sensitive      SENSITIVE0.5    * STREPTOCOCCUS PYOGENES  Rapid Influenza A&B Antigens ([facility] only)     Status: None   Collection Time: 03/16/2016  1:00 PM  Result Value Ref Range Status   Influenza A ([place]) NEGATIVE NEGATIVE Final   Influenza B ([facility]) NEGATIVE NEGATIVE Final  Blood culture (routine x 2)     Status: None   Collection Time: 02/27/2016  1:40 PM  Result Value Ref Range Status   Specimen Description BLOOD LEFT FOREARM  Final   Special Requests BOTTLES DRAWN AEROBIC AND ANAEROBIC  4CC  Final   Culture  Setup Time   Final    GRAM POSITIVE COCCI IN BOTH AEROBIC AND ANAEROBIC BOTTLES CRITICAL RESULT CALLED TO, READ BACK BY AND VERIFIED WITH:  [place] AT 5784 02/27/16 CAF    Culture   Final    STREPTOCOCCUS PYOGENES IN BOTH AEROBIC AND ANAEROBIC BOTTLES    Report Status 02/29/2016 FINAL  Final   Organism ID, Bacteria STREPTOCOCCUS PYOGENES  Final      Susceptibility   Streptococcus pyogenes - MIC*    CLINDAMYCIN Value in next row Sensitive      SENSITIVE<=0.25    AMPICILLIN Value in next row Sensitive      SENSITIVE<=0.25    ERYTHROMYCIN Value in next row Sensitive      SENSITIVE0.12    VANCOMYCIN Value in next row Sensitive      SENSITIVE0.5    CEFTRIAXONE Value in next row Sensitive      SENSITIVE0.12    LEVOFLOXACIN Value in next row Sensitive      SENSITIVE0.5    * STREPTOCOCCUS PYOGENES  Blood Culture ID Panel (Reflexed)     Status: Abnormal   Collection Time: 02/25/2016  1:40 PM  Result Value Ref Range Status   Enterococcus species NOT DETECTED NOT DETECTED Final   Vancomycin resistance NOT DETECTED NOT DETECTED Final   Listeria monocytogenes NOT DETECTED NOT DETECTED Final   Staphylococcus species NOT DETECTED NOT DETECTED Final   Staphylococcus aureus NOT DETECTED NOT DETECTED Final   Methicillin resistance NOT DETECTED NOT DETECTED Final   Streptococcus species NOT DETECTED NOT DETECTED Final   Streptococcus agalactiae NOT DETECTED NOT DETECTED Final   Streptococcus pneumoniae NOT DETECTED NOT DETECTED Final   Streptococcus pyogenes DETECTED (A) NOT DETECTED Final    Comment: CRITICAL RESULT CALLED TO, READ BACK BY AND VERIFIED WITH: NATE COOKSON AT 0426 02/27/16 CAF    Acinetobacter baumannii NOT DETECTED NOT DETECTED Final   Enterobacteriaceae species NOT DETECTED NOT DETECTED Final   Enterobacter cloacae complex NOT DETECTED NOT DETECTED Final   Escherichia coli NOT DETECTED NOT DETECTED Final   Klebsiella oxytoca NOT DETECTED NOT DETECTED Final   Klebsiella pneumoniae NOT DETECTED NOT DETECTED Final   Proteus species NOT DETECTED NOT DETECTED Final   Serratia marcescens NOT DETECTED NOT DETECTED  Final   Carbapenem resistance NOT DETECTED NOT DETECTED Final   Haemophilus influenzae NOT DETECTED NOT DETECTED Final   Neisseria meningitidis NOT DETECTED NOT DETECTED Final   Pseudomonas aeruginosa NOT DETECTED NOT DETECTED Final   Candida albicans NOT DETECTED NOT  DETECTED Final   Candida glabrata NOT DETECTED NOT DETECTED Final   Candida krusei NOT DETECTED NOT DETECTED Final   Candida parapsilosis NOT DETECTED NOT DETECTED Final   Candida tropicalis NOT DETECTED NOT DETECTED Final  Body fluid culture     Status: None   Collection Time: 03/11/2016  3:05 PM  Result Value Ref Range Status   Specimen Description R Knee  Final   Special Requests NONE  Final   Gram Stain   Final    MODERATE WBC SEEN MODERATE GRAM POSITIVE COCCI IN PAIRS AND CHAINS    Culture   Final    HEAVY GROWTH GROUP A STREP (S.PYOGENES) ISOLATED There is no known Penicillin Resistant Beta Streptococcus in the [place] For patients that are Penicillin-allergic, Erythromycin is 85-94% susceptible, and Clindamycin is 80% susceptible.  Contact Microbiology within 7 days if sensitivity testing is  required.   NO ANAEROBES ISOLATED    Report Status 02/29/2016 FINAL  Final  Body fluid culture     Status: None   Collection Time: 02/25/2016  4:10 PM  Result Value Ref Range Status   Specimen Description R Knee  Final   Special Requests NONE  Final   Gram Stain   Final    MANY WBC SEEN MANY GRAM POSITIVE COCCI IN PAIRS AND CHAINS    Culture   Final    HEAVY GROWTH GROUP A STREP (S.PYOGENES) ISOLATED There is no known Penicillin Resistant Beta Streptococcus in the [place] For patients that are Penicillin-allergic, Erythromycin is 85-94% susceptible, and Clindamycin is 80% susceptible.  Contact Microbiology within 7 days if sensitivity testing is  required.   NO ANAEROBES ISOLATED    Report Status 02/29/2016 FINAL  Final  MRSA PCR Screening     Status: None   Collection Time: 02/27/16  3:35 AM  Result Value Ref Range Status    MRSA by PCR NEGATIVE NEGATIVE Final    Comment:        The GeneXpert MRSA Assay (FDA approved for NASAL specimens only), is one component of a comprehensive MRSA colonization surveillance program. It is not intended to diagnose MRSA infection nor to guide or monitor treatment for MRSA infections.   Urine culture     Status: None   Collection Time: 02/27/16  8:30 AM  Result Value Ref Range Status   Specimen Description URINE, RANDOM  Final   Special Requests NONE  Final   Culture NO GROWTH 1 DAY  Final   Report Status 02/28/2016 FINAL  Final  Culture, blood (single) w Reflex to PCR ID Panel     Status: None   Collection Time: 02/29/16  2:36 PM  Result Value Ref Range Status   Specimen Description BLOOD LEFT HAND  Final   Special Requests BOTTLES DRAWN AEROBIC AND ANAEROBIC [place]  Final   Culture NO GROWTH 5 DAYS  Final   Report Status 03/05/2016 FINAL  Final  C difficile quick scan w PCR reflex     Status: None   Collection Time: 03/06/16  5:37 PM  Result Value Ref Range Status   C Diff antigen NEGATIVE NEGATIVE Final   C Diff toxin NEGATIVE NEGATIVE Final   C Diff interpretation Negative for C. difficile  Final  Culture, respiratory (NON-Expectorated)     Status: None   Collection Time: 03/07/16  8:20 AM  Result Value Ref Range Status   Specimen Description TRACHEAL ASPIRATE  Final   Special Requests NONE  Final   Gram Stain   Final  MODERATE WBC SEEN FEW SQUAMOUS EPITHELIAL CELLS PRESENT RARE YEAST    Culture Consistent with normal respiratory flora.  Final   Report Status 03/09/2016 FINAL  Final  Culture, respiratory (NON-Expectorated)     Status: None   Collection Time: 03/08/16  4:52 PM  Result Value Ref Range Status   Specimen Description TRACHEAL ASPIRATE  Final   Special Requests NONE  Final   Gram Stain NO ORGANISMS SEEN  Final   Culture Consistent with normal respiratory flora.  Final   Report Status 03/10/2016 FINAL  Final    Coagulation  Studies: No results for input(s): LABPROT, INR in the last 72 hours.  Urinalysis: No results for input(s): COLORURINE, LABSPEC, PHURINE, GLUCOSEU, HGBUR, BILIRUBINUR, KETONESUR, PROTEINUR, UROBILINOGEN, NITRITE, LEUKOCYTESUR in the last 72 hours.  Invalid input(s): APPERANCEUR    Imaging: Dg Abd 1 View  03/08/2016  CLINICAL DATA:  Orogastric tube placement EXAM: ABDOMEN - 1 VIEW COMPARISON:  Portable exam 1630 hours compared to 03/03/2016 FINDINGS: Tip of nasogastric tube projects over mid stomach. Visualized bowel gas pattern normal. Tip of RIGHT femoral line projects over inferior endplate of L4. Prior T12 vertebroplasty. Bones demineralized. IMPRESSION: Tip of nasogastric tube projects over mid stomach. Electronically Signed   By: Lavonia Dana M.D.   On: 03/08/2016 18:52   Ct Head Wo Contrast  03/08/2016  CLINICAL DATA:  Failed extubation today, respiratory issues following sepsis and respiratory failure as well as NSTEMI, altered mental status question stroke, history diabetes mellitus, CHF EXAM: CT HEAD WITHOUT CONTRAST TECHNIQUE: Contiguous axial images were obtained from the base of the skull through the vertex without intravenous contrast. COMPARISON:  10/21/2015 FINDINGS: Scattered beam hardening artifacts of dental origin. Cavum septum pellucidum and vergae. Generalized atrophy. Normal ventricular morphology. No midline shift or mass effect. Otherwise normal appearance of brain parenchyma. No definite intracranial hemorrhage, mass lesion or evidence acute infarction. No extra-axial fluid collections. Extensive atherosclerotic calcifications at the carotid siphons. Chronic opacification of the RIGHT maxillary sinus with osseous thickening. Partial opacification of a few RIGHT ethmoid air cells. Skull intact. IMPRESSION: Generalized atrophy. No acute intracranial abnormalities. Chronic RIGHT maxillary sinusitis. Electronically Signed   By: Lavonia Dana M.D.   On: 03/08/2016 17:20   US Venous  Img Lower Bilateral  03/08/2016  EXAM: BILATERAL LOWER EXTREMITY VENOUS DOPPLER ULTRASOUND TECHNIQUE: Gray-scale sonography with graded compression, as well as color Doppler and duplex ultrasound were performed to evaluate the lower extremity deep venous systems from the level of the common femoral vein and including the common femoral, femoral, profunda femoral, popliteal and calf veins including the posterior tibial, peroneal and gastrocnemius veins when visible. The superficial great saphenous vein was also interrogated. Spectral Doppler was utilized to evaluate flow at rest and with distal augmentation maneuvers in the common femoral, femoral and popliteal veins. COMPARISON:  None. FINDINGS: RIGHT LOWER EXTREMITY Common Femoral Vein: No evidence of thrombus. Normal compressibility, respiratory phasicity and response to augmentation. Saphenofemoral Junction: No evidence of thrombus. Normal compressibility and flow on color Doppler imaging. Profunda Femoral Vein: No evidence of thrombus. Normal compressibility and flow on color Doppler imaging. Femoral Vein: No evidence of thrombus. Normal compressibility, respiratory phasicity and response to augmentation. Popliteal Vein: No evidence of thrombus. Normal compressibility, respiratory phasicity and response to augmentation. Calf Veins: No evidence of thrombus. Normal compressibility and flow on color Doppler imaging. Peroneal veins not visualized. Superficial Great Saphenous Vein: No evidence of thrombus. Normal compressibility and flow on color Doppler imaging. Other Findings:  None. LEFT  LOWER EXTREMITY Common Femoral Vein: No evidence of thrombus. Normal compressibility, respiratory phasicity and response to augmentation. Saphenofemoral Junction: No evidence of thrombus. Normal compressibility and flow on color Doppler imaging. Profunda Femoral Vein: No evidence of thrombus. Normal compressibility and flow on color Doppler imaging. Femoral Vein: No evidence of  thrombus. Normal compressibility, respiratory phasicity and response to augmentation. Popliteal Vein: No evidence of thrombus. Normal compressibility, respiratory phasicity and response to augmentation. Calf Veins: No evidence of thrombus. Normal compressibility and flow on color Doppler imaging. Peroneal veins not visualized. Superficial Great Saphenous Vein: No evidence of thrombus. Normal compressibility and flow on color Doppler imaging. Other Findings: Exam was limited due to lower extremity edema bilaterally. IMPRESSION: No evidence of deep venous thrombosis. Electronically Signed   By: [place]   On: 03/08/2016 14:52   Dg Chest Port 1 View  03/10/2016  CLINICAL DATA:  Acute respiratory failure. EXAM: PORTABLE CHEST 1 VIEW COMPARISON:  Yesterday. FINDINGS: The endotracheal tube tip 2 cm above the carina. Nasogastric tube tip in the proximal stomach. Right jugular catheter tip in the superior vena cava. Poor inspiration. Normal sized heart. Stable post CABG changes. No significant change in left lower lobe airspace opacity and small left pleural effusion. Stable small right pleural effusion and minimal right basilar atelectasis. The interstitial markings remain mildly prominent. Diffuse osteopenia. IMPRESSION: 1. Stable left lower lobe atelectasis or pneumonia. 2. Stable small bilateral pleural effusions and mild chronic interstitial lung disease. Electronically Signed   By: Claudie Revering M.D.   On: 03/10/2016 07:34   Dg Chest Port 1 View  03/09/2016  CLINICAL DATA:  Evaluate respiratory failure EXAM: PORTABLE CHEST 1 VIEW COMPARISON:  03/08/2016 FINDINGS: Interval placement of NG tube into the stomach. Right central line and endotracheal tube are unchanged. Bilateral perihilar and lower lobe opacities slightly improved since prior study. Small bilateral effusions suspected. Heart is borderline in size. No acute bony abnormality. IMPRESSION: Improving bilateral airspace opacities with persistent  perihilar and lower lobe airspace disease. This could represent edema or infection. Small bilateral effusions. Electronically Signed   By: Rolm Baptise M.D.   On: 03/09/2016 08:52   Portable Chest Xray  03/08/2016  CLINICAL DATA:  Hypoxia EXAM: PORTABLE CHEST 1 VIEW COMPARISON:  Study obtained earlier in the day FINDINGS: The endotracheal tube tip is at the carina directed toward the right main bronchus. Central catheter tip is in the superior vena cava. No pneumothorax. There are bilateral pleural effusions with perihilar edema, slightly more on the left than on the right. Heart is mildly enlarged. There is atherosclerotic calcification in the aorta. IMPRESSION: Tube and catheter positions as described without pneumothorax. Endotracheal tube tip is at the carina directed at the right main bronchus. Advise withdrawing endotracheal tube approximately 4 cm. Evidence of congestive heart failure. Superimposed pneumonia in the upper lobes, particularly on the left, cannot be excluded radiographically. Critical Value/emergent results were called by telephone at the time of interpretation on 03/08/2016 at 4:51 pm to Delton See, RN. who verbally acknowledged these results. Electronically Signed   By: Lowella Grip III M.D.   On: 03/08/2016 16:52     Medications:   . alteplase    . dexmedetomidine 0.2 mcg/kg/hr (03/10/16 0834)  . feeding supplement (VITAL AF 1.2 CAL) 1,000 mL (03/10/16 0405)  . phenylephrine (NEO-SYNEPHRINE) Adult infusion 30 mcg/min (03/10/16 0835)  . pureflow 2,000 mL/hr at 03/10/16 0640   . albumin human  12.5 g Intravenous 3 times per day  . antiseptic  oral rinse  7 mL Mouth Rinse QID  . aspirin  325 mg Per Tube Daily  . budesonide (PULMICORT) nebulizer solution  0.25 mg Nebulization BID  . chlorhexidine gluconate (SAGE KIT)  15 mL Mouth Rinse BID  . famotidine  20 mg Per Tube Daily  . feeding supplement (PRO-STAT SUGAR FREE 64)  30 mL Oral QID  . insulin aspart  0-15 Units  Subcutaneous 6 times per day  . ipratropium-albuterol  3 mL Nebulization Q6H  . pencillin G potassium IV  3 Million Units Intravenous 4 times per day  . pravastatin  40 mg Per Tube q1800  . sodium chloride flush  3 mL Intravenous Q12H   acetaminophen **OR** [DISCONTINUED] acetaminophen, albuterol, alteplase, fentaNYL (SUBLIMAZE) injection, heparin, midazolam, ondansetron **OR** ondansetron (ZOFRAN) IV, sodium chloride flush  Assessment/ Plan:  75 y.o. Caucasian male  with significant medical history of coronary disease with four-vessel CABG in 1998, gallbladder rupture, extensive surgery, Diabetes with complications of retinopathy, peripheral neuropathy, peripheral vascular disease, Aorto iliac bypass, angioplasty and stent in his left leg, kyphoplasty for chronic back pain, CKD st 3 with Baseline Cr 1.5/ GFR 46  1. 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 Rt Knee septic arthritis  3. NSTEMI 4. DM-2 with CKD 5. Hypotension 6. Acute resp faliure. Intubated 4/3, extubated and re-intubated 4/13 7. Hypokalemia 8. Hyponatremia 9. Septic shock.(strep pyogenes)  10. Metabolic Acidosis. 11. Generalized edema  Plan: Patient continues to have significant volume on board. We will continue to perform continuous renal replacement therapy with ultrafiltration rate to 100-125 cc per hour. Continue to monitor serum electrolytes. We remain hopeful that his renal function will slowly improve over time. Continue ventilatory support under the instruction of pulmonary critical care. - CRRT continued, 4 K dialysate, UF goal 100/hr - iv albumin support - Trach / PEG planned for Monday.    LOS: [place] 4/15/20179:30 AM

## 2016-03-10 NOTE — Progress Notes (Signed)
Pt. Remains on crrt with no complications.  Neo at 65mcgs.  Tolerating tube feeds.  Pt does open eyes spontaneously but does not track nor follow commands. Pt's temp decreased to 93.7rectal at 1:00am.  Magdelene NP notified and warming blanket applied.  Temperature currently at 96.6 rectal.

## 2016-03-11 ENCOUNTER — Inpatient Hospital Stay: Payer: Commercial Managed Care - HMO

## 2016-03-11 LAB — RENAL FUNCTION PANEL
ALBUMIN: 1.9 g/dL — AB (ref 3.5–5.0)
ALBUMIN: 1.8 g/dL — AB (ref 3.5–5.0)
ANION GAP: 3 — AB (ref 5–15)
ANION GAP: 2 — AB (ref 5–15)
Albumin: 1.8 g/dL — ABNORMAL LOW (ref 3.5–5.0)
Albumin: 1.8 g/dL — ABNORMAL LOW (ref 3.5–5.0)
Anion gap: 4 — ABNORMAL LOW (ref 5–15)
Anion gap: 2 — ABNORMAL LOW (ref 5–15)
BUN: 32 mg/dL — ABNORMAL HIGH (ref 6–20)
BUN: 33 mg/dL — ABNORMAL HIGH (ref 6–20)
BUN: 33 mg/dL — AB (ref 6–20)
BUN: 33 mg/dL — ABNORMAL HIGH (ref 6–20)
CALCIUM: 8.2 mg/dL — AB (ref 8.9–10.3)
CALCIUM: 8.2 mg/dL — AB (ref 8.9–10.3)
CALCIUM: 8.3 mg/dL — AB (ref 8.9–10.3)
CO2: 28 mmol/L (ref 22–32)
CO2: 28 mmol/L (ref 22–32)
CO2: 26 mmol/L (ref 22–32)
CO2: 28 mmol/L (ref 22–32)
CREATININE: 0.78 mg/dL (ref 0.61–1.24)
CREATININE: 0.79 mg/dL (ref 0.61–1.24)
Calcium: 8.2 mg/dL — ABNORMAL LOW (ref 8.9–10.3)
Chloride: 106 mmol/L (ref 101–111)
Chloride: 107 mmol/L (ref 101–111)
Chloride: 107 mmol/L (ref 101–111)
Chloride: 107 mmol/L (ref 101–111)
Creatinine, Ser: 0.68 mg/dL (ref 0.61–1.24)
Creatinine, Ser: 0.73 mg/dL (ref 0.61–1.24)
GFR calc Af Amer: >60 mL/min (ref >60)
GFR calc Af Amer: >60 mL/min (ref >60)
GFR calc non Af Amer: >60 mL/min (ref >60)
GFR calc non Af Amer: >60 mL/min (ref >60)
GFR calc non Af Amer: >60 mL/min (ref >60)
GLUCOSE: 166 mg/dL — AB (ref 65–99)
GLUCOSE: 183 mg/dL — AB (ref 65–99)
GLUCOSE: 201 mg/dL — AB (ref 65–99)
Glucose, Bld: 221 mg/dL — ABNORMAL HIGH (ref 65–99)
PHOSPHORUS: 2.9 mg/dL (ref 2.5–4.6)
PHOSPHORUS: 2.7 mg/dL (ref 2.5–4.6)
PHOSPHORUS: 2.4 mg/dL — AB (ref 2.5–4.6)
POTASSIUM: 4.4 mmol/L (ref 3.5–5.1)
POTASSIUM: 4.7 mmol/L (ref 3.5–5.1)
POTASSIUM: 4.7 mmol/L (ref 3.5–5.1)
Phosphorus: 3.1 mg/dL (ref 2.5–4.6)
Potassium: 4.3 mmol/L (ref 3.5–5.1)
SODIUM: 137 mmol/L (ref 135–145)
SODIUM: 137 mmol/L (ref 135–145)
SODIUM: 137 mmol/L (ref 135–145)
Sodium: 137 mmol/L (ref 135–145)

## 2016-03-11 LAB — BLOOD GAS, ARTERIAL
Acid-Base Excess: 4.6 mmol/L — ABNORMAL HIGH (ref 0.0–3.0)
Bicarbonate: 28.1 mEq/L — ABNORMAL HIGH (ref 21.0–28.0)
FIO2: 0.28
MECHANICAL RATE: 16
O2 Saturation: 98.5 %
PATIENT TEMPERATURE: 37
PCO2 ART: 36 mmHg (ref 32.0–48.0)
PEEP: 5 cmH2O
PH ART: 7.5 — AB (ref 7.350–7.450)
VT: 500 mL
pO2, Arterial: 104 mmHg (ref 83.0–108.0)

## 2016-03-11 LAB — CBC
HEMATOCRIT: 22 % — AB (ref 40.0–52.0)
HEMATOCRIT: 20.4 % — AB (ref 40.0–52.0)
HEMOGLOBIN: 7.3 g/dL — AB (ref 13.0–18.0)
HEMOGLOBIN: 6.6 g/dL — AB (ref 13.0–18.0)
MCH: 31.1 pg (ref 26.0–34.0)
MCH: 30.1 pg (ref 26.0–34.0)
MCHC: 33.4 g/dL (ref 32.0–36.0)
MCHC: 32.4 g/dL (ref 32.0–36.0)
MCV: 93.2 fL (ref 80.0–100.0)
MCV: 92.7 fL (ref 80.0–100.0)
Platelets: 73 10*3/uL — ABNORMAL LOW (ref 150–440)
Platelets: 66 10*3/uL — ABNORMAL LOW (ref 150–440)
RBC: 2.36 MIL/uL — ABNORMAL LOW (ref 4.40–5.90)
RBC: 2.2 MIL/uL — ABNORMAL LOW (ref 4.40–5.90)
RDW: 14.5 % (ref 11.5–14.5)
RDW: 14.8 % — AB (ref 11.5–14.5)
WBC: 8.2 10*3/uL (ref 3.8–10.6)
WBC: 6.3 10*3/uL (ref 3.8–10.6)

## 2016-03-11 LAB — GLUCOSE, CAPILLARY
GLUCOSE-CAPILLARY: 150 mg/dL — AB (ref 65–99)
GLUCOSE-CAPILLARY: 215 mg/dL — AB (ref 65–99)
GLUCOSE-CAPILLARY: 224 mg/dL — AB (ref 65–99)
Glucose-Capillary: 165 mg/dL — ABNORMAL HIGH (ref 65–99)
Glucose-Capillary: 147 mg/dL — ABNORMAL HIGH (ref 65–99)
Glucose-Capillary: 193 mg/dL — ABNORMAL HIGH (ref 65–99)

## 2016-03-11 LAB — MAGNESIUM
Magnesium: 1.7 mg/dL (ref 1.7–2.4)
Magnesium: 1.6 mg/dL — ABNORMAL LOW (ref 1.7–2.4)

## 2016-03-11 MED ORDER — SODIUM CHLORIDE 0.9 % IV SOLN
1.0000 mg/h | INTRAVENOUS | Status: DC
  Filled 2016-03-11: qty 10

## 2016-03-11 MED ORDER — MAGNESIUM SULFATE 2 GM/50ML IV SOLN
2.0000 g | Freq: Once | INTRAVENOUS | Status: AC
  Administered 2016-03-11: 2 g via INTRAVENOUS
  Filled 2016-03-11: qty 50

## 2016-03-11 MED ORDER — SODIUM CHLORIDE 0.9 % IV SOLN
Freq: Once | INTRAVENOUS | Status: AC
  Administered 2016-03-12: 02:00:00 via INTRAVENOUS

## 2016-03-11 NOTE — Progress Notes (Signed)
PULMONARY / CRITICAL CARE MEDICINE   Name: John Schultz MRN: [id number] DOB: 1941/09/21    ADMISSION DATE:  03/09/2016  BRIEF HISTORY: 75 year old male past medical history of hypertension, chronic diastolic heart failure, CK, diabetes, presented on April 2 with four-day history of progressive right knee pain fever, shortness of breath, cough. Within 24 hours decompensated requiring intubation on 04/03.  Seen by orthopedics, had drain placed in right knee with purulent material. Now with mechanical ventilation and unable to weaned off after failed extubation 04/13 2/2  stridor and poor cough.   SUBJECTIVE:  No acute issues overnight except for transient bradycardia.On precedex gtt at 0.2.  Remains on full vent support and CRRT.   SIGNIFICANT EVENTS/STUDIES: 04/02 admitted by Hospitalist Service with 4 day history of right knee pain - swollen, erythematous and tender on exam - and severe sepsis, AKI, abnormal EKG. Cardiac markers positive (peak troponin I 8.10) 04/03 Orthopedics consultation and aspiration of R Knee, with drain placement 04/03 ID consultation 04/03 Nephrology consultation 04/04 TTE: LVEF 60-65%, no vegetations 04/04 altered mental status, worsening respiratory status, septic, intubated 04/04 R knee hemovac removed by ortho 04/06 Cardiology consultation: NSTEMI - deemed secondary to demand ischemia. Further W/U to be considered after resolution of severe sepsis and critical illness 04/09 CRRT initiated  04/10 agitated on WUA. Failed SBT. Off and on norepienphrine 04/11 Tolerating volume removal by CRRT. Failed SBT. Tolerated PS 10-14 cm H2O 04/12 Failed SBT. Agitated on WUA. Uo improving. Started back on fentanyl gtt in PM 04/13 LE venous US:  04/13 Passed SBT. Cognition remains marginal. Extubated. Post extubation stridor and poor cough. BiPAP initiated. High risk of re-intubation 04/13 Re-intubated in afternoon. ENT consult for trach tube placement - planned 04/17.   04/13 CT head: diffuse atrophy, no acute disease 04/14 CRRT resumed due to hypervolemia  INDWELLING DEVICES:: R IJ CVL 04/03 >>  ETT 04/03 >> 04/13, 04/13 >>  R femoral HD cath 04/09 >>   MICRO DATA: 04/02 MRSA PCR >> NEG 04/02 flu screen >> NEG 04/02 blood >> Strep pyogenes 04/02 knee aspirate >> Strep pyogenes 04/05 blood >> NEG 04/11 C diff >> NEG 04/12 Resp >> NOF 04/13 Resp >>   PCT algorithm 04/03: 22.50 > 16.65 > 7.12 PCT algorithm 04/12: 0.62 > 0.57 > 2.66  ANTIMICROBIALS:  Ceftriaxone 04/02 X 1  Vanc 04/02 >> 04/03 Pip-tazo 04/02 >> 04/03 Clinda 04/03 >> 04/07 Pen G 04/03 >>    VITAL SIGNS: Temp:  [95.9 F (35.5 C)-99.9 F (37.7 C)] 99.7 F (37.6 C) (04/16 1200) Pulse Rate:  [55-71] 65 (04/16 1200) Resp:  [19-33] 24 (04/16 1200) BP: (91-148)/(51-73) 101/59 mmHg (04/16 1200) SpO2:  [100 %] 100 % (04/16 1200) FiO2 (%):  [26 %-28 %] 26 % (04/16 1112) Weight:  [228 lb 13.4 oz (103.8 kg)] 228 lb 13.4 oz (103.8 kg) (04/16 0345) HEMODYNAMICS:   VENTILATOR SETTINGS: Vent Mode:  [-] PRVC FiO2 (%):  [26 %-28 %] 26 % Set Rate:  [16 bmp] 16 bmp Vt Set:  [500 mL] 500 mL PEEP:  [5 cmH20] 5 cmH20 INTAKE / OUTPUT:  Intake/Output Summary (Last 24 hours) at 03/11/16 1312 Last data filed at 03/11/16 1200  Gross per 24 hour  Intake 2376.93 ml  Output   2432 ml  Net -55.07 ml    Review of Systems  Unable to perform ROS: intubated    Physical Exam  Constitutional:  RASS -3, not F/C  HENT:  Head: Normocephalic and atraumatic.  Eyes:  EOM are normal. Pupils are equal, round, and reactive to light.  Cardiovascular: Regular rhythm and normal heart sounds.   No murmur heard. Bradycardiac at 54 bpm  Pulmonary/Chest: No respiratory distress. He has no wheezes. He has no rales.  Abdominal: Soft. Bowel sounds are normal. He exhibits no distension. There is no tenderness.  Musculoskeletal:  R>L LE edema  Neurological: He has normal reflexes.  Moves all  extremities     LABS:  CBC  Recent Labs Lab 03/09/16 0419 03/10/16 0426 03/11/16 0348  WBC 10.1 7.8 8.2  HGB 7.4* 7.3* 7.3*  HCT 22.0* 22.0* 22.0*  PLT 84* 75* 73*   Coag's No results for input(s): APTT, INR in the last 168 hours. BMET  Recent Labs Lab 03/10/16 2121 03/11/16 0348 03/11/16 0934  NA 137 137 137  K 4.5 4.4 4.7  CL 106 106 107  CO2 27 28 28   BUN 32* 32* 33*  CREATININE 0.77 0.68 0.73  GLUCOSE 192* 166* 183*   Electrolytes  Recent Labs Lab 03/10/16 0426  03/10/16 1610 03/10/16 2121 03/11/16 0348 03/11/16 0934  CALCIUM 7.7*  < > 8.2* 8.2* 8.2* 8.2*  MG 1.6*  --  1.7  --  1.7  --   PHOS 3.1  < > 2.6 2.9 3.1 2.9  < > = values in this interval not displayed. Sepsis Markers  Recent Labs Lab 03/08/16 0630 03/09/16 0419 03/10/16 0426  PROCALCITON 0.57 2.66 2.35   ABG  Recent Labs Lab 03/11/16 0457  PHART 7.50*  PCO2ART 36  PO2ART 104   Liver Enzymes  Recent Labs Lab 03/06/16 0513  03/10/16 0426  03/10/16 2121 03/11/16 0348 03/11/16 0934  AST 48*  --  39  --   --   --   --   ALT 28  --  21  --   --   --   --   ALKPHOS 77  --  93  --   --   --   --   BILITOT 0.6  --  0.6  --   --   --   --   ALBUMIN 1.3*  1.4*  < > 1.4*  < > 1.7* 1.8* 1.8*  < > = values in this interval not displayed. Cardiac Enzymes  Recent Labs Lab 03/09/16 0419  TROPONINI 1.05*   Glucose  Recent Labs Lab 03/10/16 1611 03/10/16 1955 03/10/16 2336 03/11/16 0356 03/11/16 0712 03/11/16 1130  GLUCAP 241* 200* 169* 165* 147* 193*    CXR: images from 4/16 reviewed; Continued bilateral airspace opacities with persistent perihilar and lower lobe airspace disease   ASSESSMENT / PLAN:  PULMONARY Acute vent dep respiratory failure Pulm edema pattern on CXR Failed extubation 04/13 P: Cont vent support - settings reviewed and/or adjusted Cont vent bundle Daily SBT and sedation vacation; low probability for optimal SBT/PSV trials ENT plans trach  tube o4/17   CARDIOVASCULAR Septic shock, resolved NSTEMI - Cardiology has seen PAF P:  MAP goal > 60 mmHg PRN phenylephrine Cont ASA and statin Further cardiac w/u after critical illness resolves per caardiology  RENAL AKI Severe hypervolemia P: Monitor BMET intermittently Monitor I/Os Correct electrolytes as indicated Renal Service following CRRT  resumed 04/14; continue per nephroollogy  GASTROINTESTINAL Diarrhea  Hemoccult positive  P: SUP: enteral famotidine Cont TFs Monitor for bleeding  HEMATOLOGIC Anemia without overt bleeding Thrombocytopenia  P: DVT px: SCDs (SQ heparin DC'd 04/12) Monitor CBC intermittently Transfuse per usual guidelines  INFECTIOUS Septic arthritis of the R  knee Purulent ET secretions - much improved Small increase in PCT 04/14 - unclear significance P: Monitor temp, WBC count Micro and abx as above ID service following Recheck PCT 04/15  ENDOCRINE DM 2, controlled Hyperglycemia improved P: Cont Lantus Cont SSI  NEUROLOGIC ICU acquired delirium - hypoactive Profound deconditioning P: RASS goal: 0, -1 Precedex initiated 04/14; continue to titrate to RASS goal Monitor for bradycardia PRN fentanyl Willl need extensive rehab if he survives this acute phase of illness     03/11/2016 Critical Care Attestation.  I have personally obtained a history, examined the patient, evaluated laboratory and imaging results, formulated the assessment and plan and placed orders. The Patient requires high complexity decision making for assessment and support, frequent evaluation and titration of therapies, application of advanced monitoring technologies and extensive interpretation of multiple databases. The patient has critical illness that could lead imminently to failure of 1 or more organ systems and requires the highest level of physician preparedness to intervene.  Critical Care Time devoted to patient care services described in this  note is 35 minutes and is exclusive of time spent in procedures.

## 2016-03-11 NOTE — Progress Notes (Signed)
RN notified Dr Candiss Norse of patient's serum magnesium at 1.6.  MD gave order for 2G magnesium sulfate IV x1.  MD also gave order to check CBC x1 and to hold tube feeding after midnight tonight.

## 2016-03-11 NOTE — Progress Notes (Signed)
Dr Candiss Norse gave order to monitor CVP q6h

## 2016-03-11 NOTE — Progress Notes (Signed)
RN clarifies with Dr Juanell Fairly that patient's platelet is 86 this am, it was 9 yesterday, patient may be trached Monday, should RN hold 325mg  of aspirin ordered for today?  MD states "It's okay, go ahead and give it"

## 2016-03-11 NOTE — Progress Notes (Signed)
CRRT machine's filter clotted, filter changed without difficulty.  Resume CRRT.  RN continue to monitor.

## 2016-03-11 NOTE — Progress Notes (Signed)
UF rate increased to 150 per Dr Keturah Barre order.

## 2016-03-11 NOTE — Progress Notes (Signed)
Resumed CRRT

## 2016-03-11 NOTE — Progress Notes (Signed)
[facility] NOTE  Pharmacy Consult for CRRT Medication Review   Allergies  Allergen Reactions  . Tape Rash    Patient Measurements: Height: 6' (182.9 cm) Weight: 228 lb 13.4 oz (103.8 kg) IBW/kg (Calculated) : 77.6  Vital Signs: Temp: 98.8 F (37.1 C) (04/16 1000) Temp Source: Rectal (04/16 0700) BP: 112/57 mmHg (04/16 1000) Pulse Rate: 66 (04/16 1000) Intake/Output from previous day: 04/15 0701 - 04/16 0700 In: 2986.9 [I.V.:526.9; NG/GT:1560; IV Piggyback:550] Out: 2461 [Urine:240] Intake/Output from this shift: Total I/O In: -  Out: 193 [Other:193] Vent settings for last 24 hours: Vent Mode:  [-] PRVC FiO2 (%):  [28 %] 28 % Set Rate:  [16 bmp] 16 bmp Vt Set:  [500 mL] 500 mL PEEP:  [5 cmH20] 5 cmH20  Labs:  Recent Labs  03/09/16 0419  03/10/16 0426  03/10/16 1610 03/10/16 2121 03/11/16 0348 03/11/16 0934  WBC 10.1  --  7.8  --   --   --  8.2  --   HGB 7.4*  --  7.3*  --   --   --  7.3*  --   HCT 22.0*  --  22.0*  --   --   --  22.0*  --   PLT 84*  --  75*  --   --   --  73*  --   CREATININE 0.78  < > 0.68  < > 0.71 0.77 0.68 0.73  MG  --   < > 1.6*  --  1.7  --  1.7  --   PHOS  --   < > 3.1  < > 2.6 2.9 3.1 2.9  ALBUMIN  --   < > 1.4*  < > 1.7* 1.7* 1.8* 1.8*  PROT  --   --  5.4*  --   --   --   --   --   AST  --   --  39  --   --   --   --   --   ALT  --   --  21  --   --   --   --   --   ALKPHOS  --   --  93  --   --   --   --   --   BILITOT  --   --  0.6  --   --   --   --   --   < > = values in this interval not displayed. Estimated Creatinine Clearance: 102.5 mL/min (by C-G formula based on Cr of 0.73).   Recent Labs  03/10/16 2336 03/11/16 0356 03/11/16 0712  GLUCAP 169* 165* 147*    Microbiology: Recent Results (from the past 720 hour(s))  Blood culture (routine x 2)     Status: Abnormal   Collection Time: 03/09/2016 12:59 PM  Result Value Ref Range Status   Specimen Description BLOOD LEFT FOREARM  Final   Special Requests BOTTLES DRAWN AEROBIC AND ANAEROBIC  1CC  Final   Culture  Setup Time   Final    GRAM POSITIVE COCCI AEROBIC BOTTLE ONLY CRITICAL VALUE NOTED.  VALUE IS CONSISTENT WITH PREVIOUSLY REPORTED AND CALLED VALUE.    Culture STREPTOCOCCUS PYOGENES AEROBIC BOTTLE ONLY  (A)  Final   Report Status 03/03/2016 FINAL  Final   Organism ID, Bacteria STREPTOCOCCUS PYOGENES  Final      Susceptibility   Streptococcus pyogenes - MIC*    CLINDAMYCIN Value in next row  Sensitive      SENSITIVE0.25    AMPICILLIN Value in next row Sensitive      SENSITIVE0.25    ERYTHROMYCIN Value in next row Sensitive      SENSITIVE0.12    VANCOMYCIN Value in next row Sensitive      SENSITIVE0.5    CEFTRIAXONE Value in next row Sensitive      SENSITIVE0.12    LEVOFLOXACIN Value in next row Sensitive      SENSITIVE0.5    * STREPTOCOCCUS PYOGENES  Rapid Influenza A&B Antigens ([facility] only)     Status: None   Collection Time: 02/28/2016  1:00 PM  Result Value Ref Range Status   Influenza A ([facility]) NEGATIVE NEGATIVE Final   Influenza B ([facility]) NEGATIVE NEGATIVE Final  Blood culture (routine x 2)     Status: None   Collection Time: 03/05/2016  1:40 PM  Result Value Ref Range Status   Specimen Description BLOOD LEFT FOREARM  Final   Special Requests BOTTLES DRAWN AEROBIC AND ANAEROBIC  4CC  Final   Culture  Setup Time   Final    GRAM POSITIVE COCCI IN BOTH AEROBIC AND ANAEROBIC BOTTLES CRITICAL RESULT CALLED TO, READ BACK BY AND VERIFIED WITH: NATE COOKSON AT 3570 02/27/16 CAF    Culture   Final    STREPTOCOCCUS PYOGENES IN BOTH AEROBIC AND ANAEROBIC BOTTLES    Report Status 02/29/2016 FINAL  Final   Organism ID, Bacteria STREPTOCOCCUS PYOGENES  Final      Susceptibility   Streptococcus pyogenes - MIC*    CLINDAMYCIN Value in next row Sensitive      SENSITIVE<=0.25    AMPICILLIN Value in next row Sensitive      SENSITIVE<=0.25    ERYTHROMYCIN Value in next row Sensitive      SENSITIVE0.12     VANCOMYCIN Value in next row Sensitive      SENSITIVE0.5    CEFTRIAXONE Value in next row Sensitive      SENSITIVE0.12    LEVOFLOXACIN Value in next row Sensitive      SENSITIVE0.5    * STREPTOCOCCUS PYOGENES  Blood Culture ID Panel (Reflexed)     Status: Abnormal   Collection Time: 03/25/2016  1:40 PM  Result Value Ref Range Status   Enterococcus species NOT DETECTED NOT DETECTED Final   Vancomycin resistance NOT DETECTED NOT DETECTED Final   Listeria monocytogenes NOT DETECTED NOT DETECTED Final   Staphylococcus species NOT DETECTED NOT DETECTED Final   Staphylococcus aureus NOT DETECTED NOT DETECTED Final   Methicillin resistance NOT DETECTED NOT DETECTED Final   Streptococcus species NOT DETECTED NOT DETECTED Final   Streptococcus agalactiae NOT DETECTED NOT DETECTED Final   Streptococcus pneumoniae NOT DETECTED NOT DETECTED Final   Streptococcus pyogenes DETECTED (A) NOT DETECTED Final    Comment: CRITICAL RESULT CALLED TO, READ BACK BY AND VERIFIED WITH: NATE COOKSON AT 0426 02/27/16 CAF    Acinetobacter baumannii NOT DETECTED NOT DETECTED Final   Enterobacteriaceae species NOT DETECTED NOT DETECTED Final   Enterobacter cloacae complex NOT DETECTED NOT DETECTED Final   Escherichia coli NOT DETECTED NOT DETECTED Final   Klebsiella oxytoca NOT DETECTED NOT DETECTED Final   Klebsiella pneumoniae NOT DETECTED NOT DETECTED Final   Proteus species NOT DETECTED NOT DETECTED Final   Serratia marcescens NOT DETECTED NOT DETECTED Final   Carbapenem resistance NOT DETECTED NOT DETECTED Final   Haemophilus influenzae NOT DETECTED NOT DETECTED Final   Neisseria meningitidis NOT DETECTED NOT DETECTED Final  Pseudomonas aeruginosa NOT DETECTED NOT DETECTED Final   Candida albicans NOT DETECTED NOT DETECTED Final   Candida glabrata NOT DETECTED NOT DETECTED Final   Candida krusei NOT DETECTED NOT DETECTED Final   Candida parapsilosis NOT DETECTED NOT DETECTED Final   Candida tropicalis NOT  DETECTED NOT DETECTED Final  Body fluid culture     Status: None   Collection Time: 03/07/2016  3:05 PM  Result Value Ref Range Status   Specimen Description R Knee  Final   Special Requests NONE  Final   Gram Stain   Final    MODERATE WBC SEEN MODERATE GRAM POSITIVE COCCI IN PAIRS AND CHAINS    Culture   Final    HEAVY GROWTH GROUP A STREP (S.PYOGENES) ISOLATED There is no known Penicillin Resistant Beta Streptococcus in the [place] For patients that are Penicillin-allergic, Erythromycin is 85-94% susceptible, and Clindamycin is 80% susceptible.  Contact Microbiology within 7 days if sensitivity testing is  required.   NO ANAEROBES ISOLATED    Report Status 02/29/2016 FINAL  Final  Body fluid culture     Status: None   Collection Time: 02/27/2016  4:10 PM  Result Value Ref Range Status   Specimen Description R Knee  Final   Special Requests NONE  Final   Gram Stain   Final    MANY WBC SEEN MANY GRAM POSITIVE COCCI IN PAIRS AND CHAINS    Culture   Final    HEAVY GROWTH GROUP A STREP (S.PYOGENES) ISOLATED There is no known Penicillin Resistant Beta Streptococcus in the [place] For patients that are Penicillin-allergic, Erythromycin is 85-94% susceptible, and Clindamycin is 80% susceptible.  Contact Microbiology within 7 days if sensitivity testing is  required.   NO ANAEROBES ISOLATED    Report Status 02/29/2016 FINAL  Final  MRSA PCR Screening     Status: None   Collection Time: 02/27/16  3:35 AM  Result Value Ref Range Status   MRSA by PCR NEGATIVE NEGATIVE Final    Comment:        The GeneXpert MRSA Assay (FDA approved for NASAL specimens only), is one component of a comprehensive MRSA colonization surveillance program. It is not intended to diagnose MRSA infection nor to guide or monitor treatment for MRSA infections.   Urine culture     Status: None   Collection Time: 02/27/16  8:30 AM  Result Value Ref Range Status   Specimen Description URINE, RANDOM  Final   Special  Requests NONE  Final   Culture NO GROWTH 1 DAY  Final   Report Status 02/28/2016 FINAL  Final  Culture, blood (single) w Reflex to PCR ID Panel     Status: None   Collection Time: 02/29/16  2:36 PM  Result Value Ref Range Status   Specimen Description BLOOD LEFT HAND  Final   Special Requests BOTTLES DRAWN AEROBIC AND ANAEROBIC [place]  Final   Culture NO GROWTH 5 DAYS  Final   Report Status 03/05/2016 FINAL  Final  C difficile quick scan w PCR reflex     Status: None   Collection Time: 03/06/16  5:37 PM  Result Value Ref Range Status   C Diff antigen NEGATIVE NEGATIVE Final   C Diff toxin NEGATIVE NEGATIVE Final   C Diff interpretation Negative for C. difficile  Final  Culture, respiratory (NON-Expectorated)     Status: None   Collection Time: 03/07/16  8:20 AM  Result Value Ref Range Status   Specimen Description TRACHEAL ASPIRATE  Final   Special Requests NONE  Final   Gram Stain   Final    MODERATE WBC SEEN FEW SQUAMOUS EPITHELIAL CELLS PRESENT RARE YEAST    Culture Consistent with normal respiratory flora.  Final   Report Status 03/09/2016 FINAL  Final  Culture, respiratory (NON-Expectorated)     Status: None   Collection Time: 03/08/16  4:52 PM  Result Value Ref Range Status   Specimen Description TRACHEAL ASPIRATE  Final   Special Requests NONE  Final   Gram Stain NO ORGANISMS SEEN  Final   Culture Consistent with normal respiratory flora.  Final   Report Status 03/10/2016 FINAL  Final    Medications:  Scheduled:  . albumin human  12.5 g Intravenous 3 times per day  . antiseptic oral rinse  7 mL Mouth Rinse QID  . aspirin  325 mg Per Tube Daily  . budesonide (PULMICORT) nebulizer solution  0.25 mg Nebulization BID  . chlorhexidine gluconate (SAGE KIT)  15 mL Mouth Rinse BID  . famotidine  20 mg Per Tube Daily  . feeding supplement (PRO-STAT SUGAR FREE 64)  30 mL Oral QID  . insulin aspart  0-15 Units Subcutaneous 6 times per day  . ipratropium-albuterol   3 mL Nebulization Q6H  . pencillin G potassium IV  3 Million Units Intravenous 4 times per day  . pravastatin  40 mg Per Tube q1800  . sodium chloride flush  3 mL Intravenous Q12H   Infusions:  . alteplase    . dexmedetomidine 0.3 mcg/kg/hr (03/11/16 0759)  . feeding supplement (VITAL AF 1.2 CAL) 1,000 mL (03/10/16 2130)  . phenylephrine (NEO-SYNEPHRINE) Adult infusion 17 mcg/min (03/11/16 0841)  . pureflow 2,000 mL/hr at 03/10/16 2113    Assessment: 75 y/o M admitted with septic shock now requiring CRRT. Pharmacy consulted to adjust medication dosing for CRRT.   Plan:  No medications require adjustment at present.   Pharmacy will continue to monitor and adjust per consult.   Olivia Canter, RPh Clinical Pharmacist 03/11/2016,10:53 AM

## 2016-03-11 NOTE — Progress Notes (Signed)
Filter clotting, changing filter and restarting CRRT

## 2016-03-11 NOTE — Progress Notes (Signed)
Subjective:  Patient seen at bedside. UOP > 240 cc Had to be re-intubated 4/14 due to respiratory distress CRRT restarted 4/14 Tolerating well.  Dialyzer clotted this morning. It is being changed Still requiring pressors- Neo @ 17 today Total UF 2.2 L in the last 24 hours   Objective:  Vital signs in last 24 hours:  Temp:  [95.9 F (35.5 C)-99.9 F (37.7 C)] 99.7 F (37.6 C) (04/16 1200) Pulse Rate:  [55-71] 65 (04/16 1200) Resp:  [19-33] 24 (04/16 1200) BP: (91-148)/(51-73) 101/59 mmHg (04/16 1200) SpO2:  [100 %] 100 % (04/16 1200) FiO2 (%):  [26 %-28 %] 26 % (04/16 1112) Weight:  [103.8 kg (228 lb 13.4 oz)] 103.8 kg (228 lb 13.4 oz) (04/16 0345)  Weight change: -3 kg (-6 lb 9.8 oz) Filed Weights   03/09/16 0420 03/10/16 0300 03/11/16 0345  Weight: 105.5 kg (232 lb 9.4 oz) 106.8 kg (235 lb 7.2 oz) 103.8 kg (228 lb 13.4 oz)    Intake/Output:    Intake/Output Summary (Last 24 hours) at 03/11/16 1315 Last data filed at 03/11/16 1200  Gross per 24 hour  Intake 2376.93 ml  Output   2432 ml  Net -55.07 ml     Physical Exam: General: Critically ill appearing   HEENT ETT  Neck supple  Pulm/lungs Vent assisted, bilateral rhonchi  CVS/Heart Regular, tachycardic, soft systolic murmur  Abdomen:  Soft, non distended, BS present  Extremities: ++ peripheral edema,    Neurologic: Sedated,  Not following commands  Skin: Heel skin breakdown  GU: Foley   Right femoral Vas-Cath/Dr. Lucky Cowboy 4/9    Basic Metabolic Panel:   Recent Labs Lab 03/08/16 0630  03/09/16 1448  03/10/16 0426 03/10/16 0940 03/10/16 1610 03/10/16 2121 03/11/16 0348 03/11/16 0934  NA 138  < > 138  < > 136 133* 138 137 137 137  K 4.4  < > 4.5  < > 4.7 4.5 4.7 4.5 4.4 4.7  CL 108  < > 108  < > 107 104 107 106 106 107  CO2 29  < > 25  < > 27 26 28 27 28 28   GLUCOSE 113*  < > 151*  < > 209* 225* 250* 192* 166* 183*  BUN 26*  < > 31*  < > 27* 27* 30* 32* 32* 33*  CREATININE 0.47*  < > 0.89  < > 0.68  0.73 0.71 0.77 0.68 0.73  CALCIUM 7.9*  < > 7.5*  < > 7.7* 7.6* 8.2* 8.2* 8.2* 8.2*  MG 1.6*  --  1.7  --  1.6*  --  1.7  --  1.7  --   PHOS 2.2*  --  3.5  < > 3.1 2.7 2.6 2.9 3.1 2.9  < > = values in this interval not displayed.   CBC:  Recent Labs Lab 03/07/16 0543 03/08/16 0630 03/09/16 0419 03/10/16 0426 03/11/16 0348  WBC 15.9* 14.5* 10.1 7.8 8.2  HGB 9.0* 8.4* 7.4* 7.3* 7.3*  HCT 27.1* 25.5* 22.0* 22.0* 22.0*  MCV 89.7 90.9 92.9 93.5 93.2  PLT 88* 85* 84* 75* 73*      Microbiology:  Recent Results (from the past 720 hour(s))  Blood culture (routine x 2)     Status: Abnormal   Collection Time: 03/21/2016 12:59 PM  Result Value Ref Range Status   Specimen Description BLOOD LEFT FOREARM  Final   Special Requests BOTTLES DRAWN AEROBIC AND ANAEROBIC  1CC  Final   Culture  Setup Time  Final    GRAM POSITIVE COCCI AEROBIC BOTTLE ONLY CRITICAL VALUE NOTED.  VALUE IS CONSISTENT WITH PREVIOUSLY REPORTED AND CALLED VALUE.    Culture STREPTOCOCCUS PYOGENES AEROBIC BOTTLE ONLY  (A)  Final   Report Status 03/03/2016 FINAL  Final   Organism ID, Bacteria STREPTOCOCCUS PYOGENES  Final      Susceptibility   Streptococcus pyogenes - MIC*    CLINDAMYCIN Value in next row Sensitive      SENSITIVE0.25    AMPICILLIN Value in next row Sensitive      SENSITIVE0.25    ERYTHROMYCIN Value in next row Sensitive      SENSITIVE0.12    VANCOMYCIN Value in next row Sensitive      SENSITIVE0.5    CEFTRIAXONE Value in next row Sensitive      SENSITIVE0.12    LEVOFLOXACIN Value in next row Sensitive      SENSITIVE0.5    * STREPTOCOCCUS PYOGENES  Rapid Influenza A&B Antigens ([facility] only)     Status: None   Collection Time: 03/25/2016  1:00 PM  Result Value Ref Range Status   Influenza A ([facility]) NEGATIVE NEGATIVE Final   Influenza B ([facility]) NEGATIVE NEGATIVE Final  Blood culture (routine x 2)     Status: None   Collection Time: 03/21/2016  1:40 PM  Result Value Ref Range Status   Specimen  Description BLOOD LEFT FOREARM  Final   Special Requests BOTTLES DRAWN AEROBIC AND ANAEROBIC  4CC  Final   Culture  Setup Time   Final    GRAM POSITIVE COCCI IN BOTH AEROBIC AND ANAEROBIC BOTTLES CRITICAL RESULT CALLED TO, READ BACK BY AND VERIFIED WITH: NATE COOKSON AT 0426 02/27/16 CAF    Culture   Final    STREPTOCOCCUS PYOGENES IN BOTH AEROBIC AND ANAEROBIC BOTTLES    Report Status 02/29/2016 FINAL  Final   Organism ID, Bacteria STREPTOCOCCUS PYOGENES  Final      Susceptibility   Streptococcus pyogenes - MIC*    CLINDAMYCIN Value in next row Sensitive      SENSITIVE<=0.25    AMPICILLIN Value in next row Sensitive      SENSITIVE<=0.25    ERYTHROMYCIN Value in next row Sensitive      SENSITIVE0.12    VANCOMYCIN Value in next row Sensitive      SENSITIVE0.5    CEFTRIAXONE Value in next row Sensitive      SENSITIVE0.12    LEVOFLOXACIN Value in next row Sensitive      SENSITIVE0.5    * STREPTOCOCCUS PYOGENES  Blood Culture ID Panel (Reflexed)     Status: Abnormal   Collection Time: 03/13/2016  1:40 PM  Result Value Ref Range Status   Enterococcus species NOT DETECTED NOT DETECTED Final   Vancomycin resistance NOT DETECTED NOT DETECTED Final   Listeria monocytogenes NOT DETECTED NOT DETECTED Final   Staphylococcus species NOT DETECTED NOT DETECTED Final   Staphylococcus aureus NOT DETECTED NOT DETECTED Final   Methicillin resistance NOT DETECTED NOT DETECTED Final   Streptococcus species NOT DETECTED NOT DETECTED Final   Streptococcus agalactiae NOT DETECTED NOT DETECTED Final   Streptococcus pneumoniae NOT DETECTED NOT DETECTED Final   Streptococcus pyogenes DETECTED (A) NOT DETECTED Final    Comment: CRITICAL RESULT CALLED TO, READ BACK BY AND VERIFIED WITH: NATE COOKSON AT 0426 02/27/16 CAF    Acinetobacter baumannii NOT DETECTED NOT DETECTED Final   Enterobacteriaceae species NOT DETECTED NOT DETECTED Final   Enterobacter cloacae complex NOT DETECTED NOT DETECTED Final  Escherichia coli NOT DETECTED NOT DETECTED Final   Klebsiella oxytoca NOT DETECTED NOT DETECTED Final   Klebsiella pneumoniae NOT DETECTED NOT DETECTED Final   Proteus species NOT DETECTED NOT DETECTED Final   Serratia marcescens NOT DETECTED NOT DETECTED Final   Carbapenem resistance NOT DETECTED NOT DETECTED Final   Haemophilus influenzae NOT DETECTED NOT DETECTED Final   Neisseria meningitidis NOT DETECTED NOT DETECTED Final   Pseudomonas aeruginosa NOT DETECTED NOT DETECTED Final   Candida albicans NOT DETECTED NOT DETECTED Final   Candida glabrata NOT DETECTED NOT DETECTED Final   Candida krusei NOT DETECTED NOT DETECTED Final   Candida parapsilosis NOT DETECTED NOT DETECTED Final   Candida tropicalis NOT DETECTED NOT DETECTED Final  Body fluid culture     Status: None   Collection Time: 03/25/2016  3:05 PM  Result Value Ref Range Status   Specimen Description R Knee  Final   Special Requests NONE  Final   Gram Stain   Final    MODERATE WBC SEEN MODERATE GRAM POSITIVE COCCI IN PAIRS AND CHAINS    Culture   Final    HEAVY GROWTH GROUP A STREP (S.PYOGENES) ISOLATED There is no known Penicillin Resistant Beta Streptococcus in the [place] For patients that are Penicillin-allergic, Erythromycin is 85-94% susceptible, and Clindamycin is 80% susceptible.  Contact Microbiology within 7 days if sensitivity testing is  required.   NO ANAEROBES ISOLATED    Report Status 02/29/2016 FINAL  Final  Body fluid culture     Status: None   Collection Time: 03/07/2016  4:10 PM  Result Value Ref Range Status   Specimen Description R Knee  Final   Special Requests NONE  Final   Gram Stain   Final    MANY WBC SEEN MANY GRAM POSITIVE COCCI IN PAIRS AND CHAINS    Culture   Final    HEAVY GROWTH GROUP A STREP (S.PYOGENES) ISOLATED There is no known Penicillin Resistant Beta Streptococcus in the [place] For patients that are Penicillin-allergic, Erythromycin is 85-94% susceptible, and Clindamycin is 80%  susceptible.  Contact Microbiology within 7 days if sensitivity testing is  required.   NO ANAEROBES ISOLATED    Report Status 02/29/2016 FINAL  Final  MRSA PCR Screening     Status: None   Collection Time: 02/27/16  3:35 AM  Result Value Ref Range Status   MRSA by PCR NEGATIVE NEGATIVE Final    Comment:        The GeneXpert MRSA Assay (FDA approved for NASAL specimens only), is one component of a comprehensive MRSA colonization surveillance program. It is not intended to diagnose MRSA infection nor to guide or monitor treatment for MRSA infections.   Urine culture     Status: None   Collection Time: 02/27/16  8:30 AM  Result Value Ref Range Status   Specimen Description URINE, RANDOM  Final   Special Requests NONE  Final   Culture NO GROWTH 1 DAY  Final   Report Status 02/28/2016 FINAL  Final  Culture, blood (single) w Reflex to PCR ID Panel     Status: None   Collection Time: 02/29/16  2:36 PM  Result Value Ref Range Status   Specimen Description BLOOD LEFT HAND  Final   Special Requests BOTTLES DRAWN AEROBIC AND ANAEROBIC [place]  Final   Culture NO GROWTH 5 DAYS  Final   Report Status 03/05/2016 FINAL  Final  C difficile quick scan w PCR reflex     Status: None  Collection Time: 03/06/16  5:37 PM  Result Value Ref Range Status   C Diff antigen NEGATIVE NEGATIVE Final   C Diff toxin NEGATIVE NEGATIVE Final   C Diff interpretation Negative for C. difficile  Final  Culture, respiratory (NON-Expectorated)     Status: None   Collection Time: 03/07/16  8:20 AM  Result Value Ref Range Status   Specimen Description TRACHEAL ASPIRATE  Final   Special Requests NONE  Final   Gram Stain   Final    MODERATE WBC SEEN FEW SQUAMOUS EPITHELIAL CELLS PRESENT RARE YEAST    Culture Consistent with normal respiratory flora.  Final   Report Status 03/09/2016 FINAL  Final  Culture, respiratory (NON-Expectorated)     Status: None   Collection Time: 03/08/16  4:52 PM  Result  Value Ref Range Status   Specimen Description TRACHEAL ASPIRATE  Final   Special Requests NONE  Final   Gram Stain NO ORGANISMS SEEN  Final   Culture Consistent with normal respiratory flora.  Final   Report Status 03/10/2016 FINAL  Final    Coagulation Studies: No results for input(s): LABPROT, INR in the last 72 hours.  Urinalysis: No results for input(s): COLORURINE, LABSPEC, PHURINE, GLUCOSEU, HGBUR, BILIRUBINUR, KETONESUR, PROTEINUR, UROBILINOGEN, NITRITE, LEUKOCYTESUR in the last 72 hours.  Invalid input(s): APPERANCEUR    Imaging: Dg Chest Port 1 View  03/11/2016  CLINICAL DATA:  75 year old male with history of acute respiratory failure. EXAM: PORTABLE CHEST 1 VIEW COMPARISON:  Chest x-ray 03/10/2016. FINDINGS: An endotracheal tube is in place with tip 2.1 cm above the carina. There is a right-sided internal jugular central venous catheter with tip terminating in the distal superior vena cava. Nasogastric tube coiled in the proximal stomach. Lung volumes are very low. Extensive bibasilar opacities may reflect areas of atelectasis and/or airspace consolidation. Small to moderate bilateral pleural effusions (right greater than left). Heart appears borderline enlarged, but size is likely accentuated by low lung volumes, portable AP technique and patient's rotation to the left which also distorts mediastinal contours. Atherosclerosis in the thoracic aorta. Status post median sternotomy for CABG. IMPRESSION: 1. Support apparatus, as above. 2. Low lung volumes with persistent and worsening bibasilar opacities which may reflect areas of atelectasis and/or airspace consolidation with slight enlargement of small to moderate bilateral pleural effusions. 3. Atherosclerosis. Electronically Signed   By: Vinnie Langton M.D.   On: 03/11/2016 08:38   Dg Chest Port 1 View  03/10/2016  CLINICAL DATA:  Acute respiratory failure. EXAM: PORTABLE CHEST 1 VIEW COMPARISON:  Yesterday. FINDINGS: The  endotracheal tube tip 2 cm above the carina. Nasogastric tube tip in the proximal stomach. Right jugular catheter tip in the superior vena cava. Poor inspiration. Normal sized heart. Stable post CABG changes. No significant change in left lower lobe airspace opacity and small left pleural effusion. Stable small right pleural effusion and minimal right basilar atelectasis. The interstitial markings remain mildly prominent. Diffuse osteopenia. IMPRESSION: 1. Stable left lower lobe atelectasis or pneumonia. 2. Stable small bilateral pleural effusions and mild chronic interstitial lung disease. Electronically Signed   By: Claudie Revering M.D.   On: 03/10/2016 07:34     Medications:   . alteplase    . dexmedetomidine 0.3 mcg/kg/hr (03/11/16 0759)  . feeding supplement (VITAL AF 1.2 CAL) 1,000 mL (03/10/16 2130)  . phenylephrine (NEO-SYNEPHRINE) Adult infusion 17 mcg/min (03/11/16 0841)  . pureflow 2,000 mL/hr at 03/10/16 2113   . albumin human  12.5 g Intravenous 3 times per  day  . antiseptic oral rinse  7 mL Mouth Rinse QID  . aspirin  325 mg Per Tube Daily  . budesonide (PULMICORT) nebulizer solution  0.25 mg Nebulization BID  . chlorhexidine gluconate (SAGE KIT)  15 mL Mouth Rinse BID  . famotidine  20 mg Per Tube Daily  . feeding supplement (PRO-STAT SUGAR FREE 64)  30 mL Oral QID  . insulin aspart  0-15 Units Subcutaneous 6 times per day  . ipratropium-albuterol  3 mL Nebulization Q6H  . pencillin G potassium IV  3 Million Units Intravenous 4 times per day  . pravastatin  40 mg Per Tube q1800  . sodium chloride flush  3 mL Intravenous Q12H   acetaminophen **OR** [DISCONTINUED] acetaminophen, albuterol, alteplase, fentaNYL (SUBLIMAZE) injection, heparin, midazolam, ondansetron **OR** ondansetron (ZOFRAN) IV, sodium chloride flush  Assessment/ Plan:  75 y.o. Caucasian male  with significant medical history of coronary disease with four-vessel CABG in 1998, gallbladder rupture, extensive  surgery, Diabetes with complications of retinopathy, peripheral neuropathy, peripheral vascular disease, Aorto iliac bypass, angioplasty and stent in his left leg, kyphoplasty for chronic back pain, CKD st 3 with Baseline Cr 1.5/ GFR 46  1. 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 Rt Knee septic arthritis  3. Generalized edema 4. Acute resp faliure. Intubated 4/3, extubated and re-intubated 4/13  5. Hypotension 6. DM-2 with CKD 7. Hypokalemia 8. Hyponatremia 9. Septic shock.(strep pyogenes)  10. Metabolic Acidosis. 11. NSTEMI   Plan: Patient continues to have significant volume on board.  He remains hypotensive, requiring pressors. Although dose of Neo-Synephrine is lower than yesterday. We will continue to perform continuous renal replacement therapy with ultrafiltration rate to 125-150 cc per hour. Continue to monitor serum electrolytes. We remain hopeful that his renal function will slowly improve over time. Continue ventilatory support under the instruction of pulmonary critical care. - CRRT continued, 4 K dialysate, UF goal 150/hr - iv albumin support - Trach / PEG planned for Monday.    LOS: 14 Jaxon Flatt 4/16/20171:15 PM

## 2016-03-12 ENCOUNTER — Inpatient Hospital Stay: Payer: Commercial Managed Care - HMO

## 2016-03-12 ENCOUNTER — Inpatient Hospital Stay: Payer: Commercial Managed Care - HMO | Admitting: Certified Registered Nurse Anesthetist

## 2016-03-12 ENCOUNTER — Encounter: Admission: EM | Disposition: E | Payer: Self-pay | Source: Home / Self Care | Attending: Internal Medicine

## 2016-03-12 HISTORY — PX: TRACHEOSTOMY TUBE PLACEMENT: SHX814

## 2016-03-12 LAB — BLOOD GAS, ARTERIAL
Acid-Base Excess: 4 mmol/L — ABNORMAL HIGH (ref 0.0–3.0)
Bicarbonate: 27.3 mEq/L (ref 21.0–28.0)
FIO2: 0.28
MECHANICAL RATE: 16
O2 Saturation: 99.2 %
PATIENT TEMPERATURE: 37
PEEP/CPAP: 5 cmH2O
PO2 ART: 129 mmHg — AB (ref 83.0–108.0)
VT: 500 mL
pCO2 arterial: 35 mmHg (ref 32.0–48.0)
pH, Arterial: 7.5 — ABNORMAL HIGH (ref 7.350–7.450)

## 2016-03-12 LAB — GLUCOSE, CAPILLARY
GLUCOSE-CAPILLARY: 130 mg/dL — AB (ref 65–99)
GLUCOSE-CAPILLARY: 90 mg/dL (ref 65–99)
Glucose-Capillary: 142 mg/dL — ABNORMAL HIGH (ref 65–99)
Glucose-Capillary: 135 mg/dL — ABNORMAL HIGH (ref 65–99)
Glucose-Capillary: 131 mg/dL — ABNORMAL HIGH (ref 65–99)
Glucose-Capillary: 156 mg/dL — ABNORMAL HIGH (ref 65–99)
Glucose-Capillary: 160 mg/dL — ABNORMAL HIGH (ref 65–99)

## 2016-03-12 LAB — RENAL FUNCTION PANEL
ALBUMIN: 2.1 g/dL — AB (ref 3.5–5.0)
ANION GAP: 1 — AB (ref 5–15)
ANION GAP: 4 — AB (ref 5–15)
ANION GAP: 3 — AB (ref 5–15)
Albumin: 2.2 g/dL — ABNORMAL LOW (ref 3.5–5.0)
Albumin: 2 g/dL — ABNORMAL LOW (ref 3.5–5.0)
Albumin: 2 g/dL — ABNORMAL LOW (ref 3.5–5.0)
Anion gap: 1 — ABNORMAL LOW (ref 5–15)
BUN: 30 mg/dL — AB (ref 6–20)
BUN: 29 mg/dL — ABNORMAL HIGH (ref 6–20)
BUN: 30 mg/dL — ABNORMAL HIGH (ref 6–20)
BUN: 24 mg/dL — ABNORMAL HIGH (ref 6–20)
CALCIUM: 8.5 mg/dL — AB (ref 8.9–10.3)
CHLORIDE: 108 mmol/L (ref 101–111)
CHLORIDE: 108 mmol/L (ref 101–111)
CHLORIDE: 108 mmol/L (ref 101–111)
CO2: 29 mmol/L (ref 22–32)
CO2: 29 mmol/L (ref 22–32)
CO2: 25 mmol/L (ref 22–32)
CO2: 26 mmol/L (ref 22–32)
CREATININE: 0.67 mg/dL (ref 0.61–1.24)
Calcium: 8.7 mg/dL — ABNORMAL LOW (ref 8.9–10.3)
Calcium: 8.4 mg/dL — ABNORMAL LOW (ref 8.9–10.3)
Calcium: 8.3 mg/dL — ABNORMAL LOW (ref 8.9–10.3)
Chloride: 107 mmol/L (ref 101–111)
Creatinine, Ser: 0.66 mg/dL (ref 0.61–1.24)
Creatinine, Ser: 0.7 mg/dL (ref 0.61–1.24)
Creatinine, Ser: 0.69 mg/dL (ref 0.61–1.24)
GFR calc Af Amer: >60 mL/min (ref >60)
GFR calc Af Amer: >60 mL/min (ref >60)
GFR calc non Af Amer: >60 mL/min (ref >60)
GFR calc non Af Amer: >60 mL/min (ref >60)
GFR calc non Af Amer: >60 mL/min (ref >60)
GLUCOSE: 161 mg/dL — AB (ref 65–99)
GLUCOSE: 172 mg/dL — AB (ref 65–99)
Glucose, Bld: 159 mg/dL — ABNORMAL HIGH (ref 65–99)
Glucose, Bld: 118 mg/dL — ABNORMAL HIGH (ref 65–99)
PHOSPHORUS: 2.7 mg/dL (ref 2.5–4.6)
PHOSPHORUS: 2.7 mg/dL (ref 2.5–4.6)
POTASSIUM: 4.7 mmol/L (ref 3.5–5.1)
POTASSIUM: 4.7 mmol/L (ref 3.5–5.1)
POTASSIUM: 4.3 mmol/L (ref 3.5–5.1)
Phosphorus: 3.1 mg/dL (ref 2.5–4.6)
Phosphorus: 2.4 mg/dL — ABNORMAL LOW (ref 2.5–4.6)
Potassium: 4.6 mmol/L (ref 3.5–5.1)
SODIUM: 137 mmol/L (ref 135–145)
SODIUM: 137 mmol/L (ref 135–145)
Sodium: 138 mmol/L (ref 135–145)
Sodium: 137 mmol/L (ref 135–145)

## 2016-03-12 LAB — CBC
HCT: 27.3 % — ABNORMAL LOW (ref 40.0–52.0)
Hemoglobin: 9.3 g/dL — ABNORMAL LOW (ref 13.0–18.0)
MCH: 31.4 pg (ref 26.0–34.0)
MCHC: 34.1 g/dL (ref 32.0–36.0)
MCV: 92.2 fL (ref 80.0–100.0)
PLATELETS: 69 10*3/uL — AB (ref 150–440)
RBC: 2.96 MIL/uL — AB (ref 4.40–5.90)
RDW: 14.3 % (ref 11.5–14.5)
WBC: 7.8 10*3/uL (ref 3.8–10.6)

## 2016-03-12 LAB — BASIC METABOLIC PANEL
Anion gap: 1 — ABNORMAL LOW (ref 5–15)
BUN: 29 mg/dL — AB (ref 6–20)
CO2: 30 mmol/L (ref 22–32)
CREATININE: 0.69 mg/dL (ref 0.61–1.24)
Calcium: 8.5 mg/dL — ABNORMAL LOW (ref 8.9–10.3)
Chloride: 107 mmol/L (ref 101–111)
GFR calc Af Amer: >60 mL/min (ref >60)
GLUCOSE: 149 mg/dL — AB (ref 65–99)
POTASSIUM: 4.5 mmol/L (ref 3.5–5.1)
Sodium: 138 mmol/L (ref 135–145)

## 2016-03-12 LAB — MAGNESIUM
MAGNESIUM: 1.7 mg/dL (ref 1.7–2.4)
Magnesium: 1.9 mg/dL (ref 1.7–2.4)

## 2016-03-12 SURGERY — CREATION, TRACHEOSTOMY
Anesthesia: General | Wound class: Clean Contaminated

## 2016-03-12 MED ORDER — FENTANYL CITRATE (PF) 100 MCG/2ML IJ SOLN: 25.0000 ug | INTRAMUSCULAR | Status: DC | PRN

## 2016-03-12 MED ORDER — SODIUM CHLORIDE 0.9 % IV SOLN
INTRAVENOUS | Status: DC | PRN
  Administered 2016-03-12: 12:00:00 via INTRAVENOUS

## 2016-03-12 MED ORDER — DEXMEDETOMIDINE HCL IN NACL 400 MCG/100ML IV SOLN
INTRAVENOUS | Status: DC | PRN
  Administered 2016-03-12: .2 ug/kg/h via INTRAVENOUS

## 2016-03-12 MED ORDER — HEPARIN SODIUM (PORCINE) 1000 UNIT/ML IJ SOLN
1000.0000 [IU] | Freq: Once | INTRAMUSCULAR | Status: AC
  Administered 2016-03-12: 1000 [IU] via INTRAVENOUS

## 2016-03-12 MED ORDER — FENTANYL CITRATE (PF) 100 MCG/2ML IJ SOLN
INTRAMUSCULAR | Status: DC | PRN
  Administered 2016-03-12: 50 ug via INTRAVENOUS

## 2016-03-12 MED ORDER — PROPOFOL 10 MG/ML IV BOLUS
INTRAVENOUS | Status: DC | PRN
  Administered 2016-03-12: 20 mg via INTRAVENOUS

## 2016-03-12 MED ORDER — ONDANSETRON HCL 4 MG/2ML IJ SOLN: 4.0000 mg | Freq: Once | INTRAMUSCULAR | Status: DC | PRN

## 2016-03-12 MED ORDER — LIDOCAINE-EPINEPHRINE 1 %-1:100000 IJ SOLN
INTRAMUSCULAR | Status: AC
  Filled 2016-03-12: qty 1

## 2016-03-12 MED ORDER — PHENYLEPHRINE HCL 10 MG/ML IJ SOLN
20.0000 mg | INTRAVENOUS | Status: DC | PRN
  Administered 2016-03-12: 20 ug/min via INTRAVENOUS

## 2016-03-12 SURGICAL SUPPLY — 29 items
BLADE SURG 15 STRL LF DISP TIS (BLADE) ×1 IMPLANT
BLADE SURG 15 STRL SS (BLADE) ×2
BLADE SURG SZ11 CARB STEEL (BLADE) ×3 IMPLANT
CANISTER SUCT 1200ML W/VALVE (MISCELLANEOUS) ×3 IMPLANT
CORD BIP STRL DISP 12FT (MISCELLANEOUS) ×3 IMPLANT
ELECT REM PT RETURN 9FT ADLT (ELECTROSURGICAL) ×3
ELECTRODE REM PT RTRN 9FT ADLT (ELECTROSURGICAL) ×1 IMPLANT
FORCEPS JEWEL BIP 4-3/4 STR (INSTRUMENTS) ×3 IMPLANT
GAUZE IODOFORM PACK 1/2 7832 (GAUZE/BANDAGES/DRESSINGS) ×3 IMPLANT
GLOVE BIO SURGEON STRL SZ7.5 (GLOVE) ×3 IMPLANT
GOWN STRL REUS W/ TWL LRG LVL3 (GOWN DISPOSABLE) ×2 IMPLANT
GOWN STRL REUS W/TWL LRG LVL3 (GOWN DISPOSABLE) ×4
HARMONIC SCALPEL FOCUS (MISCELLANEOUS) ×3 IMPLANT
HEMOSTAT SURGICEL 2X3 (HEMOSTASIS) ×3 IMPLANT
KIT RM TURNOVER STRD PROC AR (KITS) ×3 IMPLANT
LABEL OR SOLS (LABEL) ×3 IMPLANT
NS IRRIG 500ML POUR BTL (IV SOLUTION) ×3 IMPLANT
PACK HEAD/NECK (MISCELLANEOUS) ×3 IMPLANT
SPONGE DRAIN TRACH 4X4 STRL 2S (GAUZE/BANDAGES/DRESSINGS) ×3 IMPLANT
SPONGE KITTNER 5P (MISCELLANEOUS) ×3 IMPLANT
SUCTION FRAZIER HANDLE 10FR (MISCELLANEOUS) ×2
SUCTION TUBE FRAZIER 10FR DISP (MISCELLANEOUS) ×1 IMPLANT
SUT SILK 2 0 (SUTURE) ×2
SUT SILK 2 0 SH (SUTURE) ×12 IMPLANT
SUT SILK 2-0 18XBRD TIE 12 (SUTURE) ×1 IMPLANT
SUT VIC AB 3-0 PS2 18 (SUTURE) ×3 IMPLANT
SYR 3ML LL SCALE MARK (SYRINGE) ×3 IMPLANT
TUBE TRACH SHILEY  6 DIST  CUF (TUBING) IMPLANT
TUBE TRACH SHILEY 8 DIST CUF (TUBING) ×3 IMPLANT

## 2016-03-12 NOTE — Progress Notes (Signed)
Orogastric tube removed per order and nurse attempted to place nasogastric multiple times with no success.  Dr. Mortimer Fries notified and advised nurse to leave feeding tube out.  Patient to be assessed for permanent feeding tube.

## 2016-03-12 NOTE — Anesthesia Preprocedure Evaluation (Signed)
Anesthesia Evaluation  Patient identified by MRN, date of birth, ID band Patient unresponsive    Airway Mallampati: Intubated       Dental   Pulmonary asthma , sleep apnea ,       + intubated    Cardiovascular Exercise Tolerance: Poor + Past MI and +CHF   Rhythm:Regular     Neuro/Psych    GI/Hepatic negative GI ROS, Neg liver ROS,   Endo/Other  diabetes, Type 1, Insulin DependentMorbid obesity  Renal/GU      Musculoskeletal   Abdominal (+) + obese,   Peds  Hematology   Anesthesia Other Findings   Reproductive/Obstetrics                             Anesthesia Physical Anesthesia Plan  ASA: IV  Anesthesia Plan: General   Post-op Pain Management:    Induction: Intravenous  Airway Management Planned: Oral ETT  Additional Equipment:   Intra-op Plan:   Post-operative Plan: Post-operative intubation/ventilation  Informed Consent: I have reviewed the patients History and Physical, chart, labs and discussed the procedure including the risks, benefits and alternatives for the proposed anesthesia with the patient or authorized representative who has indicated his/her understanding and acceptance.   History available from chart only  Plan Discussed with: CRNA  Anesthesia Plan Comments:         Anesthesia Quick Evaluation

## 2016-03-12 NOTE — Progress Notes (Signed)
Patient accepted from OR post trach.  VSS.

## 2016-03-12 NOTE — Progress Notes (Signed)
CRRT re-started via right vascular catherter per MD order.

## 2016-03-12 NOTE — Progress Notes (Signed)
Subjective:   Wife and son at bedside. Scheduled for tracheostomy later today.  On CRRT UOP 850 Getting IV albumin.    Objective:  Vital signs in last 24 hours:  Temp:  [96.6 F (35.9 C)-99.9 F (37.7 C)] 96.6 F (35.9 C) (04/17 0923) Pulse Rate:  [54-102] 59 (04/17 0923) Resp:  [17-31] 17 (04/17 0923) BP: (83-156)/(45-81) 148/74 mmHg (04/17 0923) SpO2:  [99 %-100 %] 99 % (04/17 0923) FiO2 (%):  [26 %-28 %] 28 % (04/17 0923) Weight:  [104.9 kg (231 lb 4.2 oz)] 104.9 kg (231 lb 4.2 oz) (04/17 0500)  Weight change: 1.1 kg (2 lb 6.8 oz) Filed Weights   03/10/16 0300 03/11/16 0345 02/25/2016 0500  Weight: 106.8 kg (235 lb 7.2 oz) 103.8 kg (228 lb 13.4 oz) 104.9 kg (231 lb 4.2 oz)    Intake/Output:    Intake/Output Summary (Last 24 hours) at 03/08/2016 0931 Last data filed at 03/25/2016 0700  Gross per 24 hour  Intake 2873.96 ml  Output   4250 ml  Net -1376.04 ml     Physical Exam: General: Critically ill appearing   HEENT ETT  Neck Trachea midling  Pulm/lungs Vent assisted, bilateral rhonchi, PRVC FiO2 28%  CVS/Heart tachycardic, soft systolic murmur  Abdomen:  Soft, non distended, BS present  Extremities: ++ peripheral edema,    Neurologic: Sedated, intubated  Skin: Heel skin breakdown  GU: Foley  Access:  Right femoral Vas-Cath/Dr. Lucky Cowboy 4/9    Basic Metabolic Panel:   Recent Labs Lab 03/10/16 0426  03/10/16 1610  03/11/16 0348 03/11/16 0934 03/11/16 1557 03/11/16 2155 03/13/2016 0346 02/29/2016 0549  NA 136  < > 138  < > 137 137 137 137 137 138  K 4.7  < > 4.7  < > 4.4 4.7 4.7 4.3 4.6 4.5  CL 107  < > 107  < > 106 107 107 107 107 107  CO2 27  < > 28  < > _0 GLUCOSE 209*  < > 250*  < > 166* 183* 221* 201* 161* 149*  BUN 27*  < > 30*  < > 32* 33* 33* 33* 30* 29*  CREATININE 0.68  < > 0.71  < > 0.68 0.73 0.78 0.79 0.67 0.69  CALCIUM 7.7*  < > 8.2*  < > 8.2* 8.2* 8.2* 8.3* 8.5* 8.5*  MG 1.6*  --  1.7  --  1.7  --  1.6*  --   --  1.9  PHOS  3.1  < > 2.6  < > 3.1 2.9 2.7 2.4* 2.7  --   < > = values in this interval not displayed.   CBC:  Recent Labs Lab 03/09/16 0419 03/10/16 0426 03/11/16 0348 03/11/16 2155 03/04/2016 0549  WBC 10.1 7.8 8.2 6.3 7.8  HGB 7.4* 7.3* 7.3* 6.6* 9.3*  HCT 22.0* 22.0* 22.0* 20.4* 27.3*  MCV 92.9 93.5 93.2 92.7 92.2  PLT 84* 75* 73* 66* 69*      Microbiology:  Recent Results (from the past 720 hour(s))  Blood culture (routine x 2)     Status: Abnormal   Collection Time: 02/29/2016 12:59 PM  Result Value Ref Range Status   Specimen Description BLOOD LEFT FOREARM  Final   Special Requests BOTTLES DRAWN AEROBIC AND ANAEROBIC  1CC  Final   Culture  Setup Time   Final    GRAM POSITIVE COCCI AEROBIC BOTTLE ONLY CRITICAL VALUE NOTED.  VALUE IS CONSISTENT WITH PREVIOUSLY REPORTED AND CALLED  VALUE.    Culture STREPTOCOCCUS PYOGENES AEROBIC BOTTLE ONLY  (A)  Final   Report Status 03/03/2016 FINAL  Final   Organism ID, Bacteria STREPTOCOCCUS PYOGENES  Final      Susceptibility   Streptococcus pyogenes - MIC*    CLINDAMYCIN Value in next row Sensitive      SENSITIVE0.25    AMPICILLIN Value in next row Sensitive      SENSITIVE0.25    ERYTHROMYCIN Value in next row Sensitive      SENSITIVE0.12    VANCOMYCIN Value in next row Sensitive      SENSITIVE0.5    CEFTRIAXONE Value in next row Sensitive      SENSITIVE0.12    LEVOFLOXACIN Value in next row Sensitive      SENSITIVE0.5    * STREPTOCOCCUS PYOGENES  Rapid Influenza A&B Antigens ([facility] only)     Status: None   Collection Time: 02/29/2016  1:00 PM  Result Value Ref Range Status   Influenza A ([facility]) NEGATIVE NEGATIVE Final   Influenza B ([facility]) NEGATIVE NEGATIVE Final  Blood culture (routine x 2)     Status: None   Collection Time: 03/04/2016  1:40 PM  Result Value Ref Range Status   Specimen Description BLOOD LEFT FOREARM  Final   Special Requests BOTTLES DRAWN AEROBIC AND ANAEROBIC  4CC  Final   Culture  Setup Time   Final    GRAM  POSITIVE COCCI IN BOTH AEROBIC AND ANAEROBIC BOTTLES CRITICAL RESULT CALLED TO, READ BACK BY AND VERIFIED WITH: NATE COOKSON AT 0426 02/27/16 CAF    Culture   Final    STREPTOCOCCUS PYOGENES IN BOTH AEROBIC AND ANAEROBIC BOTTLES    Report Status 02/29/2016 FINAL  Final   Organism ID, Bacteria STREPTOCOCCUS PYOGENES  Final      Susceptibility   Streptococcus pyogenes - MIC*    CLINDAMYCIN Value in next row Sensitive      SENSITIVE<=0.25    AMPICILLIN Value in next row Sensitive      SENSITIVE<=0.25    ERYTHROMYCIN Value in next row Sensitive      SENSITIVE0.12    VANCOMYCIN Value in next row Sensitive      SENSITIVE0.5    CEFTRIAXONE Value in next row Sensitive      SENSITIVE0.12    LEVOFLOXACIN Value in next row Sensitive      SENSITIVE0.5    * STREPTOCOCCUS PYOGENES  Blood Culture ID Panel (Reflexed)     Status: Abnormal   Collection Time: 03/10/2016  1:40 PM  Result Value Ref Range Status   Enterococcus species NOT DETECTED NOT DETECTED Final   Vancomycin resistance NOT DETECTED NOT DETECTED Final   Listeria monocytogenes NOT DETECTED NOT DETECTED Final   Staphylococcus species NOT DETECTED NOT DETECTED Final   Staphylococcus aureus NOT DETECTED NOT DETECTED Final   Methicillin resistance NOT DETECTED NOT DETECTED Final   Streptococcus species NOT DETECTED NOT DETECTED Final   Streptococcus agalactiae NOT DETECTED NOT DETECTED Final   Streptococcus pneumoniae NOT DETECTED NOT DETECTED Final   Streptococcus pyogenes DETECTED (A) NOT DETECTED Final    Comment: CRITICAL RESULT CALLED TO, READ BACK BY AND VERIFIED WITH: NATE COOKSON AT 0426 02/27/16 CAF    Acinetobacter baumannii NOT DETECTED NOT DETECTED Final   Enterobacteriaceae species NOT DETECTED NOT DETECTED Final   Enterobacter cloacae complex NOT DETECTED NOT DETECTED Final   Escherichia coli NOT DETECTED NOT DETECTED Final   Klebsiella oxytoca NOT DETECTED NOT DETECTED Final   Klebsiella pneumoniae NOT  DETECTED NOT  DETECTED Final   Proteus species NOT DETECTED NOT DETECTED Final   Serratia marcescens NOT DETECTED NOT DETECTED Final   Carbapenem resistance NOT DETECTED NOT DETECTED Final   Haemophilus influenzae NOT DETECTED NOT DETECTED Final   Neisseria meningitidis NOT DETECTED NOT DETECTED Final   Pseudomonas aeruginosa NOT DETECTED NOT DETECTED Final   Candida albicans NOT DETECTED NOT DETECTED Final   Candida glabrata NOT DETECTED NOT DETECTED Final   Candida krusei NOT DETECTED NOT DETECTED Final   Candida parapsilosis NOT DETECTED NOT DETECTED Final   Candida tropicalis NOT DETECTED NOT DETECTED Final  Body fluid culture     Status: None   Collection Time: 03/13/2016  3:05 PM  Result Value Ref Range Status   Specimen Description R Knee  Final   Special Requests NONE  Final   Gram Stain   Final    MODERATE WBC SEEN MODERATE GRAM POSITIVE COCCI IN PAIRS AND CHAINS    Culture   Final    HEAVY GROWTH GROUP A STREP (S.PYOGENES) ISOLATED There is no known Penicillin Resistant Beta Streptococcus in the [place] For patients that are Penicillin-allergic, Erythromycin is 85-94% susceptible, and Clindamycin is 80% susceptible.  Contact Microbiology within 7 days if sensitivity testing is  required.   NO ANAEROBES ISOLATED    Report Status 02/29/2016 FINAL  Final  Body fluid culture     Status: None   Collection Time: 03/09/2016  4:10 PM  Result Value Ref Range Status   Specimen Description R Knee  Final   Special Requests NONE  Final   Gram Stain   Final    MANY WBC SEEN MANY GRAM POSITIVE COCCI IN PAIRS AND CHAINS    Culture   Final    HEAVY GROWTH GROUP A STREP (S.PYOGENES) ISOLATED There is no known Penicillin Resistant Beta Streptococcus in the [place] For patients that are Penicillin-allergic, Erythromycin is 85-94% susceptible, and Clindamycin is 80% susceptible.  Contact Microbiology within 7 days if sensitivity testing is  required.   NO ANAEROBES ISOLATED    Report Status 02/29/2016 FINAL   Final  MRSA PCR Screening     Status: None   Collection Time: 02/27/16  3:35 AM  Result Value Ref Range Status   MRSA by PCR NEGATIVE NEGATIVE Final    Comment:        The GeneXpert MRSA Assay (FDA approved for NASAL specimens only), is one component of a comprehensive MRSA colonization surveillance program. It is not intended to diagnose MRSA infection nor to guide or monitor treatment for MRSA infections.   Urine culture     Status: None   Collection Time: 02/27/16  8:30 AM  Result Value Ref Range Status   Specimen Description URINE, RANDOM  Final   Special Requests NONE  Final   Culture NO GROWTH 1 DAY  Final   Report Status 02/28/2016 FINAL  Final  Culture, blood (single) w Reflex to PCR ID Panel     Status: None   Collection Time: 02/29/16  2:36 PM  Result Value Ref Range Status   Specimen Description BLOOD LEFT HAND  Final   Special Requests BOTTLES DRAWN AEROBIC AND ANAEROBIC [place]  Final   Culture NO GROWTH 5 DAYS  Final   Report Status 03/05/2016 FINAL  Final  C difficile quick scan w PCR reflex     Status: None   Collection Time: 03/06/16  5:37 PM  Result Value Ref Range Status   C Diff antigen NEGATIVE NEGATIVE  Final   C Diff toxin NEGATIVE NEGATIVE Final   C Diff interpretation Negative for C. difficile  Final  Culture, respiratory (NON-Expectorated)     Status: None   Collection Time: 03/07/16  8:20 AM  Result Value Ref Range Status   Specimen Description TRACHEAL ASPIRATE  Final   Special Requests NONE  Final   Gram Stain   Final    MODERATE WBC SEEN FEW SQUAMOUS EPITHELIAL CELLS PRESENT RARE YEAST    Culture Consistent with normal respiratory flora.  Final   Report Status 03/09/2016 FINAL  Final  Culture, respiratory (NON-Expectorated)     Status: None   Collection Time: 03/08/16  4:52 PM  Result Value Ref Range Status   Specimen Description TRACHEAL ASPIRATE  Final   Special Requests NONE  Final   Gram Stain NO ORGANISMS SEEN  Final    Culture Consistent with normal respiratory flora.  Final   Report Status 03/10/2016 FINAL  Final    Coagulation Studies: No results for input(s): LABPROT, INR in the last 72 hours.  Urinalysis: No results for input(s): COLORURINE, LABSPEC, PHURINE, GLUCOSEU, HGBUR, BILIRUBINUR, KETONESUR, PROTEINUR, UROBILINOGEN, NITRITE, LEUKOCYTESUR in the last 72 hours.  Invalid input(s): APPERANCEUR    Imaging: Dg Chest 1 View  03/17/2016  CLINICAL DATA:  75 year old male with dyspnea. Subsequent encounter. EXAM: CHEST 1 VIEW COMPARISON:  03/11/2016 FINDINGS: Endotracheal tube tip 1.2 cm above the carina. Right central line tip proximal superior vena cava level. Nasogastric tube curled within the stomach tip at the level of the gastric fundus. Cardiomegaly. Calcified tortuous aorta. No significant change in bibasilar consolidation may represent combination of pleural effusions, atelectasis and/ or infiltrates. Pulmonary vascular congestion/pulmonary edema. IMPRESSION: No significant change in bibasilar consolidation may represent combination of pleural effusions, atelectasis and/ or infiltrates. Pulmonary vascular congestion/pulmonary edema. Endotracheal tube tip 1.2 cm above the carina. Cardiomegaly. Electronically Signed   By: Genia Del M.D.   On: 03/02/2016 07:46   Dg Chest Port 1 View  03/11/2016  CLINICAL DATA:  75 year old male with history of acute respiratory failure. EXAM: PORTABLE CHEST 1 VIEW COMPARISON:  Chest x-ray 03/10/2016. FINDINGS: An endotracheal tube is in place with tip 2.1 cm above the carina. There is a right-sided internal jugular central venous catheter with tip terminating in the distal superior vena cava. Nasogastric tube coiled in the proximal stomach. Lung volumes are very low. Extensive bibasilar opacities may reflect areas of atelectasis and/or airspace consolidation. Small to moderate bilateral pleural effusions (right greater than left). Heart appears borderline enlarged,  but size is likely accentuated by low lung volumes, portable AP technique and patient's rotation to the left which also distorts mediastinal contours. Atherosclerosis in the thoracic aorta. Status post median sternotomy for CABG. IMPRESSION: 1. Support apparatus, as above. 2. Low lung volumes with persistent and worsening bibasilar opacities which may reflect areas of atelectasis and/or airspace consolidation with slight enlargement of small to moderate bilateral pleural effusions. 3. Atherosclerosis. Electronically Signed   By: Vinnie Langton M.D.   On: 03/11/2016 08:38     Medications:   . alteplase    . dexmedetomidine 0.2 mcg/kg/hr (03/01/2016 0650)  . feeding supplement (VITAL AF 1.2 CAL) Stopped (03/05/2016 0005)  . phenylephrine (NEO-SYNEPHRINE) Adult infusion 20 mcg/min (03/11/2016 0610)  . pureflow 3 each (03/11/16 2000)   . albumin human  12.5 g Intravenous 3 times per day  . antiseptic oral rinse  7 mL Mouth Rinse QID  . aspirin  325 mg Per Tube Daily  .  budesonide (PULMICORT) nebulizer solution  0.25 mg Nebulization BID  . chlorhexidine gluconate (SAGE KIT)  15 mL Mouth Rinse BID  . famotidine  20 mg Per Tube Daily  . feeding supplement (PRO-STAT SUGAR FREE 64)  30 mL Oral QID  . insulin aspart  0-15 Units Subcutaneous 6 times per day  . ipratropium-albuterol  3 mL Nebulization Q6H  . pencillin G potassium IV  3 Million Units Intravenous 4 times per day  . pravastatin  40 mg Per Tube q1800   acetaminophen **OR** [DISCONTINUED] acetaminophen, albuterol, alteplase, fentaNYL (SUBLIMAZE) injection, heparin, midazolam, ondansetron **OR** ondansetron (ZOFRAN) IV, sodium chloride flush  Assessment/ Plan:  75 y.o. white  male  with coronary disease with four-vessel CABG in 1998, gallbladder rupture,  Diabetes with complications of retinopathy, peripheral neuropathy, peripheral vascular disease, Aorto iliac bypass, angioplasty and stent in his left leg, kyphoplasty for chronic back pain,  CKD st 3 with Baseline Cr 1.5/ GFR 46  1. Acute renal failure on chronic kidney disease stage III: baseline creatinine of 1.5, eGFR of 46. Chronic kidney disease secondary to diabetic nephropathy. Now requiring CRRT. On vasopressors. Nonoliguric urine output.  Complicated acute renal failure with Anasarca, hypotension, metabolic acidosis, hyponatremia - Continue CRRT, increase BFR to 300. Hold for tracheostomy.  - Continue IV albumin  2. Rt Knee septic arthritis with sepsis: strep pyogenes.  - penicillin G   3. Acute resp faliure. Intubated 4/3, extubated and re-intubated 4/13.   4. Anemia with kidney failure: status post transfusion 4/16.    LOS: [place], [place] 4/17/20179:31 AM

## 2016-03-12 NOTE — Transfer of Care (Signed)
Immediate Anesthesia Transfer of Care Note  Patient: John Schultz  Procedure(s) Performed: Procedure(s): TRACHEOSTOMY (N/A)  Patient Location: PACU and ICU  Anesthesia Type:General  Level of Consciousness: sedated  Airway & Oxygen Therapy: Patient placed on Ventilator (see vital sign flow sheet for setting)  Post-op Assessment: Report given to RN and Post -op Vital signs reviewed and stable  Post vital signs: Reviewed and stable  Last Vitals:  Filed Vitals:   02/28/2016 1200 03/22/2016 1337  BP: 138/67 143/73  Pulse: 58 56  Temp:    Resp: 18     Complications: No apparent anesthesia complications

## 2016-03-12 NOTE — Progress Notes (Signed)
PULMONARY / CRITICAL CARE MEDICINE   Name: John Schultz MRN: [id number] DOB: [birth year]    ADMISSION DATE:  02/25/2016  BRIEF HISTORY: 75 year old male past medical history of hypertension, chronic diastolic heart failure, CK, diabetes, presented on April 2 with four-day history of progressive right knee pain fever, shortness of breath, cough. Within 24 hours decompensated requiring intubation on 04/03.  Seen by orthopedics, had drain placed in right knee with purulent material. Now with mechanical ventilation and unable to weaned off after failed extubation 04/13 2/2  stridor and poor cough.   SUBJECTIVE:  No acute issues overnight except for transient bradycardia.On precedex gtt at 0.2.  Remains on full vent support and CRRT.   SIGNIFICANT EVENTS/STUDIES: 04/02 admitted by Hospitalist Service with 4 day history of right knee pain - swollen, erythematous and tender on exam - and severe sepsis, AKI, abnormal EKG. Cardiac markers positive (peak troponin I 8.10) 04/03 Orthopedics consultation and aspiration of R Knee, with drain placement 04/03 ID consultation 04/03 Nephrology consultation 04/04 TTE: LVEF 60-65%, no vegetations 04/04 altered mental status, worsening respiratory status, septic, intubated 04/04 R knee hemovac removed by ortho 04/06 Cardiology consultation: NSTEMI - deemed secondary to demand ischemia. Further W/U to be considered after resolution of severe sepsis and critical illness 04/09 CRRT initiated  04/10 agitated on WUA. Failed SBT. Off and on norepienphrine 04/11 Tolerating volume removal by CRRT. Failed SBT. Tolerated PS 10-14 cm H2O 04/12 Failed SBT. Agitated on WUA. Uo improving. Started back on fentanyl gtt in PM 04/13 LE venous US:  04/13 Passed SBT. Cognition remains marginal. Extubated. Post extubation stridor and poor cough. BiPAP initiated. High risk of re-intubation 04/13 Re-intubated in afternoon. ENT consult for trach tube placement - planned 04/17.   04/13 CT head: diffuse atrophy, no acute disease 04/14 CRRT resumed due to hypervolemia 4/17 plan for trach today  INDWELLING DEVICES:: R IJ CVL 04/03 >>  ETT 04/03 >> 04/13, 04/13 >>  R femoral HD cath 04/09 >>   MICRO DATA: 04/02 MRSA PCR >> NEG 04/02 flu screen >> NEG 04/02 blood >> Strep pyogenes 04/02 knee aspirate >> Strep pyogenes 04/05 blood >> NEG 04/11 C diff >> NEG 04/12 Resp >> NOF 04/13 Resp >>   PCT algorithm 04/03: 22.50 > 16.65 > 7.12 PCT algorithm 04/12: 0.62 > 0.57 > 2.66  ANTIMICROBIALS:  Ceftriaxone 04/02 X 1  Vanc 04/02 >> 04/03 Pip-tazo 04/02 >> 04/03 Clinda 04/03 >> 04/07 Pen G 04/03 >>    VITAL SIGNS: Temp:  [96.8 F (36 C)-99.9 F (37.7 C)] 96.8 F (36 C) (04/17 0700) Pulse Rate:  [54-102] 54 (04/17 0700) Resp:  [19-31] 19 (04/17 0700) BP: (83-156)/(45-81) 108/59 mmHg (04/17 0700) SpO2:  [99 %-100 %] 100 % (04/17 0700) FiO2 (%):  [26 %-28 %] 28 % (04/17 0700) Weight:  [231 lb 4.2 oz (104.9 kg)] 231 lb 4.2 oz (104.9 kg) (04/17 0500) HEMODYNAMICS: CVP:  [1 mmHg-14 mmHg] 14 mmHg VENTILATOR SETTINGS: Vent Mode:  [-] PRVC FiO2 (%):  [26 %-28 %] 28 % Set Rate:  [16 bmp] 16 bmp Vt Set:  [500 mL] 500 mL PEEP:  [5 cmH20] 5 cmH20 Pressure Support:  [10 cmH20] 10 cmH20 INTAKE / OUTPUT:  Intake/Output Summary (Last 24 hours) at 03/11/2016 0754 Last data filed at 03/17/2016 0620  Gross per 24 hour  Intake 2877.1 ml  Output   4297 ml  Net -1419.9 ml    Review of Systems  Unable to perform ROS: intubated  Physical Exam  Constitutional:  RASS -3, not F/C  HENT:  Head: Normocephalic and atraumatic.  Eyes: EOM are normal. Pupils are equal, round, and reactive to light.  Cardiovascular: Regular rhythm and normal heart sounds.   No murmur heard. Bradycardiac at 54 bpm  Pulmonary/Chest: No respiratory distress. He has no wheezes. He has no rales.  Abdominal: Soft. Bowel sounds are normal. He exhibits no distension. There is no tenderness.   Musculoskeletal:  R>L LE edema  Neurological: He has normal reflexes.  Moves all extremities     LABS:  CBC  Recent Labs Lab 03/11/16 0348 03/11/16 2155 03/16/2016 0549  WBC 8.2 6.3 7.8  HGB 7.3* 6.6* 9.3*  HCT 22.0* 20.4* 27.3*  PLT 73* 66* 69*   Coag's No results for input(s): APTT, INR in the last 168 hours. BMET  Recent Labs Lab 03/11/16 2155 02/28/2016 0346 03/15/2016 0549  NA 137 137 138  K 4.3 4.6 4.5  CL 107 107 107  CO2 28 29 30   BUN 33* 30* 29*  CREATININE 0.79 0.67 0.69  GLUCOSE 201* 161* 149*   Electrolytes  Recent Labs Lab 03/11/16 0348  03/11/16 1557 03/11/16 2155 03/11/2016 0346 03/01/2016 0549  CALCIUM 8.2*  < > 8.2* 8.3* 8.5* 8.5*  MG 1.7  --  1.6*  --   --  1.9  PHOS 3.1  < > 2.7 2.4* 2.7  --   < > = values in this interval not displayed. Sepsis Markers  Recent Labs Lab 03/08/16 0630 03/09/16 0419 03/10/16 0426  PROCALCITON 0.57 2.66 2.35   ABG  Recent Labs Lab 03/11/16 0457 03/22/2016 0500  PHART 7.50* 7.50*  PCO2ART 36 35  PO2ART 104 129*   Liver Enzymes  Recent Labs Lab 03/06/16 0513  03/10/16 0426  03/11/16 1557 03/11/16 2155 03/05/2016 0346  AST 48*  --  39  --   --   --   --   ALT 28  --  21  --   --   --   --   ALKPHOS 77  --  93  --   --   --   --   BILITOT 0.6  --  0.6  --   --   --   --   ALBUMIN 1.3*  1.4*  < > 1.4*  < > 1.9* 1.8* 2.1*  < > = values in this interval not displayed. Cardiac Enzymes  Recent Labs Lab 03/09/16 0419  TROPONINI 1.05*   Glucose  Recent Labs Lab 03/11/16 0712 03/11/16 1130 03/11/16 1610 03/11/16 1955 03/11/16 2338 03/11/2016 0346  GLUCAP 147* 193* 215* 224* 150* 142*    CXR: images from 4/16  Continued bilateral airspace opacities with persistent perihilar and lower lobe airspace disease   ASSESSMENT / PLAN:  PULMONARY Acute vent dep respiratory failure Pulm edema pattern on CXR Failed extubation 04/13 P: Cont vent support - settings reviewed and/or  adjusted Cont vent bundle Daily SBT and sedation vacation;  ENT plans trach tube o4/17   CARDIOVASCULAR Septic shock, resolved NSTEMI - Cardiology has seen PAF P:  MAP goal > 60 mmHg PRN phenylephrine Cont ASA and statin Further cardiac w/u after critical illness resolves per cardiology  RENAL AKI Severe hypervolemia P: Monitor BMET intermittently Monitor I/Os Correct electrolytes as indicated Renal Service following CRRT  resumed 04/14; continue per nephrology  GASTROINTESTINAL Diarrhea  Hemoccult positive  P: SUP: enteral famotidine Cont TFs Monitor for bleeding  HEMATOLOGIC Anemia without overt bleeding Thrombocytopenia  P: DVT  px: SCDs (SQ heparin DC'd 04/12) Monitor CBC intermittently Transfuse per usual guidelines  INFECTIOUS Septic arthritis of the R knee Purulent ET secretions - much improved Small increase in PCT 04/14 - unclear significance P: Monitor temp, WBC count Micro and abx as above ID service following  ENDOCRINE DM 2, controlled Hyperglycemia improved P: Cont Lantus Cont SSI  NEUROLOGIC ICU acquired delirium - hypoactive Profound deconditioning P: RASS goal: 0, -1 Precedex initiated 04/14; continue to titrate to RASS goal Monitor for bradycardia PRN fentanyl Willl need extensive rehab if he survives this acute phase of illness    I have personally obtained a history, examined the patient, evaluated Pertinent laboratory and [employer]/imaging results, and  formulated the assessment and plan   The Patient requires high complexity decision making for assessment and support, frequent evaluation and titration of therapies, application of advanced monitoring technologies and extensive interpretation of multiple databases. Critical Care Time devoted to patient care services described in this note is 40 minutes.   Overall, patient is critically ill, prognosis is guarded.  Patient with Multiorgan failure and at high risk for  cardiac arrest and death.    Corrin Parker, M.D.  [facility]  Medical Director [place] Director [facility] Cardio-Pulmonary Department

## 2016-03-12 NOTE — Op Note (Signed)
..  03/21/2016 1:18 PM  [place],  Delfino Lovett [id number]  Pre-Op Dx: Respiratory Failure, Need for long term ventilation support  Post-Op Dx:  same  Proc:  Tracheostomy  Surg:  Kiah Vanalstine  Anes:  GOT  EBL:  <5  Comp:  none  Findings:  Tracheostomy placed between 2nd and 3rd tracheal rings with size 8 Cuffed shiley.  Procedure:  The patient was brought from the intensive care unit to the operating room and transferred to an operating table.  Anesthesia was administered per indwelling orotracheal tube.   Neck extension was achieved as possible anda shoulder rolke was placed.  The lower neck was palpated with the findings as described above.  1% Xylocaine with 1:100,000 epinephrine, 8 cc's, was infiltrated into the surgical field for intraoperative hemostasis.  Several minutes were allowed for this to take effect. The patient was prepped in a sterile fashion with a surgical prep from the chin down to the upper chest.  Sterile draping was accomplished in the standard fashion.  A  5 cm horizontal incision was made sharply a finger's breadth above the sternal notch, and extended through skin and subcutaneous fat.  Using cautery, the superficial layer of the deep cervical fascia was lysed.  Additional dissection revealed the strap muscles.  The midline raphe was divided in two layers and the muscles retracted laterally.  The pretracheal plane was visualized.  This was entered bluntly.  The thyroid isthmus was isolated and divided with the Harmonic scalpel.  The thyroid gland was retracted to either side.  The anterior face of the trachea was cleared.  In the  2nd to 3rd interspace, a transverse incision was made between cartilage rings into the tracheal lumen.  A 6 mm wide inferiorly based flap was generated.   A previously tested  # 8Shiley cuffed tracheostomy tube was brought into the field.  With the endotracheal tube under direct visualization through the tracheostomy, it was gently backed up.   The tracheostomy tube was inserted into the tracheal lumen.  Hemostasis was observed. The cuff was inflated and observed to be intact and containing pressure. The inner cannula was placed and ventilation assumed per tracheostomy tube.  Good chest wall motion was observed, and CO2 was documented per anesthesia.  The trach tube was secured in the standard fashion with trach ties. A 2-0 Nylon suture was used to secure the trach tube to the skin on both sides.  Hemostasis was observed again.  When satisfactory ventilation was assured, the orotracheal tube was removed.  At this point the procedure was completed.  The patient was returned to anesthesia, awakened as possible, and transferred back to the intensive care unit in stable condition.  Comment: 75 y.o. male with prolonged ventilation was the indication for today's procedure.  Anticipate a routine postoperative recovery including standard tracheal hygiene.  The sutures should be removed in 5 days.  When the patient no longer requires ventilator or pressure support, the cuff should be deflated.  Changing to an uncuffed tube and downsizing will be according to the clinical condition of the patient.   Redonna Wilbert  1:18 PM 03/11/2016

## 2016-03-12 NOTE — Progress Notes (Signed)
Tracheostomy performed without difficulty with size 8 shiley cuffed tracheostomy tube.  May change dressing tomorrow.  Remove sutures on POD#5.

## 2016-03-12 NOTE — Anesthesia Procedure Notes (Signed)
Date/Time: 03/05/2016 12:28 PM Performed by: Johnna Acosta Pre-anesthesia Checklist: Patient identified, Emergency Drugs available, Suction available, Patient being monitored and Timeout performed Patient Re-evaluated:Patient Re-evaluated prior to inductionOxygen Delivery Method: Circle system utilized Preoxygenation: Pre-oxygenation with 100% oxygen Intubation Type: Inhalational induction and IV induction Comments: Arrived with 8.0 Ett in place

## 2016-03-12 NOTE — Progress Notes (Signed)
CRRT machine stopped for tracheostomy. To be resumed when patient returns from procedure.

## 2016-03-12 NOTE — Anesthesia Postprocedure Evaluation (Signed)
Anesthesia Post Note  Patient: John Schultz  Procedure(s) Performed: Procedure(s) (LRB): TRACHEOSTOMY (N/A)  Patient location during evaluation: PACU Anesthesia Type: General Level of consciousness: awake Pain management: pain level controlled Vital Signs Assessment: post-procedure vital signs reviewed and stable Respiratory status: spontaneous breathing Cardiovascular status: blood pressure returned to baseline Anesthetic complications: no    Last Vitals:  Filed Vitals:   03/05/2016 1200 03/25/2016 1337  BP: 138/67 143/73  Pulse: 58 56  Temp:    Resp: 18     Last Pain:  Filed Vitals:   03/19/2016 1338  PainSc: 0-No pain                 VAN STAVEREN,Rulon Abdalla

## 2016-03-12 NOTE — Progress Notes (Signed)
Patient transported to OR with primary RN, Dr. Laureen Abrahams, OR orderly, and respiratory therapist.  [name] report given to [name], RN in [place].

## 2016-03-12 NOTE — Progress Notes (Signed)
[facility] NOTE  Pharmacy Consult for CRRT Medication Review   Allergies  Allergen Reactions  . Tape Rash    Patient Measurements: Height: 6' (182.9 cm) Weight: 231 lb 4.2 oz (104.9 kg) IBW/kg (Calculated) : 77.6  Vital Signs: Temp: 96.6 F (35.9 C) (04/17 0923) Temp Source: Rectal (04/17 0515) BP: 148/74 mmHg (04/17 0923) Pulse Rate: 59 (04/17 0923) Intake/Output from previous day: 04/16 0701 - 04/17 0700 In: 2894 [I.V.:426.1; Blood:577.8; NG/GT:1290; IV Piggyback:600] Out: 5320 [Urine:850; Stool:650] Intake/Output from this shift: Total I/O In: 38.4 [I.V.:38.4] Out: 434 [Other:434] Vent settings for last 24 hours: Vent Mode:  [-] PRVC FiO2 (%):  [28 %] 28 % Set Rate:  [16 bmp] 16 bmp Vt Set:  [500 mL] 500 mL PEEP:  [5 cmH20] 5 cmH20 Pressure Support:  [10 cmH20] 10 cmH20 Plateau Pressure:  [20 cmH20] 20 cmH20  Labs:  Recent Labs  03/10/16 0426  03/11/16 0348  03/11/16 1557 03/11/16 2155 03/13/2016 0346 03/03/2016 0549 03/24/2016 0924  WBC 7.8  --  8.2  --   --  6.3  --  7.8  --   HGB 7.3*  --  7.3*  --   --  6.6*  --  9.3*  --   HCT 22.0*  --  22.0*  --   --  20.4*  --  27.3*  --   PLT 75*  --  73*  --   --  66*  --  69*  --   CREATININE 0.68  < > 0.68  < > 0.78 0.79 0.67 0.69 0.66  MG 1.6*  < > 1.7  --  1.6*  --   --  1.9  --   PHOS 3.1  < > 3.1  < > 2.7 2.4* 2.7  --  2.7  ALBUMIN 1.4*  < > 1.8*  < > 1.9* 1.8* 2.1*  --  2.2*  PROT 5.4*  --   --   --   --   --   --   --   --   AST 39  --   --   --   --   --   --   --   --   ALT 21  --   --   --   --   --   --   --   --   ALKPHOS 93  --   --   --   --   --   --   --   --   BILITOT 0.6  --   --   --   --   --   --   --   --   < > = values in this interval not displayed. Estimated Creatinine Clearance: 102.9 mL/min (by C-G formula based on Cr of 0.66).   Recent Labs  03/03/2016 0346 03/21/2016 0805 03/09/2016 1109  GLUCAP 142* 135* 131*    Microbiology: Recent Results (from the past  720 hour(s))  Blood culture (routine x 2)     Status: Abnormal   Collection Time: 03/11/2016 12:59 PM  Result Value Ref Range Status   Specimen Description BLOOD LEFT FOREARM  Final   Special Requests BOTTLES DRAWN AEROBIC AND ANAEROBIC  1CC  Final   Culture  Setup Time   Final    GRAM POSITIVE COCCI AEROBIC BOTTLE ONLY CRITICAL VALUE NOTED.  VALUE IS CONSISTENT WITH PREVIOUSLY REPORTED AND CALLED VALUE.    Culture STREPTOCOCCUS PYOGENES AEROBIC BOTTLE  ONLY  (A)  Final   Report Status 03/03/2016 FINAL  Final   Organism ID, Bacteria STREPTOCOCCUS PYOGENES  Final      Susceptibility   Streptococcus pyogenes - MIC*    CLINDAMYCIN Value in next row Sensitive      SENSITIVE0.25    AMPICILLIN Value in next row Sensitive      SENSITIVE0.25    ERYTHROMYCIN Value in next row Sensitive      SENSITIVE0.12    VANCOMYCIN Value in next row Sensitive      SENSITIVE0.5    CEFTRIAXONE Value in next row Sensitive      SENSITIVE0.12    LEVOFLOXACIN Value in next row Sensitive      SENSITIVE0.5    * STREPTOCOCCUS PYOGENES  Rapid Influenza A&B Antigens ([facility] only)     Status: None   Collection Time: 03/09/2016  1:00 PM  Result Value Ref Range Status   Influenza A ([facility]) NEGATIVE NEGATIVE Final   Influenza B ([facility]) NEGATIVE NEGATIVE Final  Blood culture (routine x 2)     Status: None   Collection Time: 03/10/2016  1:40 PM  Result Value Ref Range Status   Specimen Description BLOOD LEFT FOREARM  Final   Special Requests BOTTLES DRAWN AEROBIC AND ANAEROBIC  4CC  Final   Culture  Setup Time   Final    GRAM POSITIVE COCCI IN BOTH AEROBIC AND ANAEROBIC BOTTLES CRITICAL RESULT CALLED TO, READ BACK BY AND VERIFIED WITH: NATE COOKSON AT 0426 02/27/16 CAF    Culture   Final    STREPTOCOCCUS PYOGENES IN BOTH AEROBIC AND ANAEROBIC BOTTLES    Report Status 02/29/2016 FINAL  Final   Organism ID, Bacteria STREPTOCOCCUS PYOGENES  Final      Susceptibility   Streptococcus pyogenes - MIC*    CLINDAMYCIN Value  in next row Sensitive      SENSITIVE<=0.25    AMPICILLIN Value in next row Sensitive      SENSITIVE<=0.25    ERYTHROMYCIN Value in next row Sensitive      SENSITIVE0.12    VANCOMYCIN Value in next row Sensitive      SENSITIVE0.5    CEFTRIAXONE Value in next row Sensitive      SENSITIVE0.12    LEVOFLOXACIN Value in next row Sensitive      SENSITIVE0.5    * STREPTOCOCCUS PYOGENES  Blood Culture ID Panel (Reflexed)     Status: Abnormal   Collection Time: 03/13/2016  1:40 PM  Result Value Ref Range Status   Enterococcus species NOT DETECTED NOT DETECTED Final   Vancomycin resistance NOT DETECTED NOT DETECTED Final   Listeria monocytogenes NOT DETECTED NOT DETECTED Final   Staphylococcus species NOT DETECTED NOT DETECTED Final   Staphylococcus aureus NOT DETECTED NOT DETECTED Final   Methicillin resistance NOT DETECTED NOT DETECTED Final   Streptococcus species NOT DETECTED NOT DETECTED Final   Streptococcus agalactiae NOT DETECTED NOT DETECTED Final   Streptococcus pneumoniae NOT DETECTED NOT DETECTED Final   Streptococcus pyogenes DETECTED (A) NOT DETECTED Final    Comment: CRITICAL RESULT CALLED TO, READ BACK BY AND VERIFIED WITH: NATE COOKSON AT 0426 02/27/16 CAF    Acinetobacter baumannii NOT DETECTED NOT DETECTED Final   Enterobacteriaceae species NOT DETECTED NOT DETECTED Final   Enterobacter cloacae complex NOT DETECTED NOT DETECTED Final   Escherichia coli NOT DETECTED NOT DETECTED Final   Klebsiella oxytoca NOT DETECTED NOT DETECTED Final   Klebsiella pneumoniae NOT DETECTED NOT DETECTED Final   Proteus species NOT  DETECTED NOT DETECTED Final   Serratia marcescens NOT DETECTED NOT DETECTED Final   Carbapenem resistance NOT DETECTED NOT DETECTED Final   Haemophilus influenzae NOT DETECTED NOT DETECTED Final   Neisseria meningitidis NOT DETECTED NOT DETECTED Final   Pseudomonas aeruginosa NOT DETECTED NOT DETECTED Final   Candida albicans NOT DETECTED NOT DETECTED Final    Candida glabrata NOT DETECTED NOT DETECTED Final   Candida krusei NOT DETECTED NOT DETECTED Final   Candida parapsilosis NOT DETECTED NOT DETECTED Final   Candida tropicalis NOT DETECTED NOT DETECTED Final  Body fluid culture     Status: None   Collection Time: 03/05/2016  3:05 PM  Result Value Ref Range Status   Specimen Description R Knee  Final   Special Requests NONE  Final   Gram Stain   Final    MODERATE WBC SEEN MODERATE GRAM POSITIVE COCCI IN PAIRS AND CHAINS    Culture   Final    HEAVY GROWTH GROUP A STREP (S.PYOGENES) ISOLATED There is no known Penicillin Resistant Beta Streptococcus in the [place] For patients that are Penicillin-allergic, Erythromycin is 85-94% susceptible, and Clindamycin is 80% susceptible.  Contact Microbiology within 7 days if sensitivity testing is  required.   NO ANAEROBES ISOLATED    Report Status 02/29/2016 FINAL  Final  Body fluid culture     Status: None   Collection Time: 03/04/2016  4:10 PM  Result Value Ref Range Status   Specimen Description R Knee  Final   Special Requests NONE  Final   Gram Stain   Final    MANY WBC SEEN MANY GRAM POSITIVE COCCI IN PAIRS AND CHAINS    Culture   Final    HEAVY GROWTH GROUP A STREP (S.PYOGENES) ISOLATED There is no known Penicillin Resistant Beta Streptococcus in the [place] For patients that are Penicillin-allergic, Erythromycin is 85-94% susceptible, and Clindamycin is 80% susceptible.  Contact Microbiology within 7 days if sensitivity testing is  required.   NO ANAEROBES ISOLATED    Report Status 02/29/2016 FINAL  Final  MRSA PCR Screening     Status: None   Collection Time: 02/27/16  3:35 AM  Result Value Ref Range Status   MRSA by PCR NEGATIVE NEGATIVE Final    Comment:        The GeneXpert MRSA Assay (FDA approved for NASAL specimens only), is one component of a comprehensive MRSA colonization surveillance program. It is not intended to diagnose MRSA infection nor to guide or monitor treatment  for MRSA infections.   Urine culture     Status: None   Collection Time: 02/27/16  8:30 AM  Result Value Ref Range Status   Specimen Description URINE, RANDOM  Final   Special Requests NONE  Final   Culture NO GROWTH 1 DAY  Final   Report Status 02/28/2016 FINAL  Final  Culture, blood (single) w Reflex to PCR ID Panel     Status: None   Collection Time: 02/29/16  2:36 PM  Result Value Ref Range Status   Specimen Description BLOOD LEFT HAND  Final   Special Requests BOTTLES DRAWN AEROBIC AND ANAEROBIC [place]  Final   Culture NO GROWTH 5 DAYS  Final   Report Status 03/05/2016 FINAL  Final  C difficile quick scan w PCR reflex     Status: None   Collection Time: 03/06/16  5:37 PM  Result Value Ref Range Status   C Diff antigen NEGATIVE NEGATIVE Final   C Diff toxin NEGATIVE NEGATIVE Final  C Diff interpretation Negative for C. difficile  Final  Culture, respiratory (NON-Expectorated)     Status: None   Collection Time: 03/07/16  8:20 AM  Result Value Ref Range Status   Specimen Description TRACHEAL ASPIRATE  Final   Special Requests NONE  Final   Gram Stain   Final    MODERATE WBC SEEN FEW SQUAMOUS EPITHELIAL CELLS PRESENT RARE YEAST    Culture Consistent with normal respiratory flora.  Final   Report Status 03/09/2016 FINAL  Final  Culture, respiratory (NON-Expectorated)     Status: None   Collection Time: 03/08/16  4:52 PM  Result Value Ref Range Status   Specimen Description TRACHEAL ASPIRATE  Final   Special Requests NONE  Final   Gram Stain NO ORGANISMS SEEN  Final   Culture Consistent with normal respiratory flora.  Final   Report Status 03/10/2016 FINAL  Final    Medications:  Scheduled:  . albumin human  12.5 g Intravenous 3 times per day  . antiseptic oral rinse  7 mL Mouth Rinse QID  . aspirin  325 mg Per Tube Daily  . budesonide (PULMICORT) nebulizer solution  0.25 mg Nebulization BID  . chlorhexidine gluconate (SAGE KIT)  15 mL Mouth Rinse BID  .  famotidine  20 mg Per Tube Daily  . feeding supplement (PRO-STAT SUGAR FREE 64)  30 mL Oral QID  . insulin aspart  0-15 Units Subcutaneous 6 times per day  . ipratropium-albuterol  3 mL Nebulization Q6H  . pencillin G potassium IV  3 Million Units Intravenous 4 times per day  . pravastatin  40 mg Per Tube q1800   Infusions:  . alteplase    . dexmedetomidine 0.2 mcg/kg/hr (03/01/2016 1104)  . feeding supplement (VITAL AF 1.2 CAL) Stopped (03/06/2016 0005)  . phenylephrine (NEO-SYNEPHRINE) Adult infusion 20 mcg/min (03/10/2016 1104)  . pureflow 3 each (03/11/16 2000)    Assessment: 75 y/o M admitted with septic shock now requiring CRRT. Pharmacy consulted to adjust medication dosing for CRRT.   Plan:  No medications require adjustment at present.   Pharmacy will continue to monitor and adjust per consult.   Napoleon Form, RPh Clinical Pharmacist 03/07/2016,11:57 AM

## 2016-03-12 NOTE — Progress Notes (Signed)
Nutrition Follow-up  DOCUMENTATION CODES:   Not applicable  INTERVENTION:  -EN: recommend continuing current TF regimen post procedure   NUTRITION DIAGNOSIS:   Inadequate oral intake related to acute illness as evidenced by NPO status.  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, respiratory distress with severe acidosis requiring intubation on 02/27/16   Pt remains on vent, plan for trach today, continues on CRRT  EN: tolerating Vital AF 1.2 at rate of 55 ml/hr, Prostat QID  Skin:   (stage I pressure ulcer on buttock, stage II on sacrum)  Last BM:  03/08/16 rectal tube in place   Labs: reviewed  Meds: ss novolog, precedex   Height:   Ht Readings from Last 1 Encounters:  02/28/16 6' (1.829 m)    Weight:   Wt Readings from Last 1 Encounters:  03/06/2016 231 lb 4.2 oz (104.9 kg)   Filed Weights   03/10/16 0300 03/11/16 0345 03/07/2016 0500  Weight: 235 lb 7.2 oz (106.8 kg) 228 lb 13.4 oz (103.8 kg) 231 lb 4.2 oz (104.9 kg)    BMI:  Body mass index is 31.36 kg/(m^2).  Estimated Nutritional Needs:   Kcal:  1903 kcals (Ve: 14.2, Tmax: 36.8)   Protein:  128-170 g (1.5-2.0 g/kg)   Fluid:  >1.9 L  EDUCATION NEEDS:   Education needs no appropriate at this time  [place], [place], [place] [phone] Pager  [phone] Weekend/On-Call Pager

## 2016-03-12 NOTE — Progress Notes (Signed)
After further assessment, RT CVL in since 4/3. Will obtain PICC line for long term abx use and long term therapy

## 2016-03-13 ENCOUNTER — Encounter: Payer: Self-pay | Admitting: Otolaryngology

## 2016-03-13 ENCOUNTER — Inpatient Hospital Stay: Payer: Commercial Managed Care - HMO

## 2016-03-13 DIAGNOSIS — I9589 Other hypotension: Secondary | ICD-10-CM

## 2016-03-13 DIAGNOSIS — R451 Restlessness and agitation: Secondary | ICD-10-CM

## 2016-03-13 LAB — BLOOD GAS, ARTERIAL
Acid-base deficit: 0.4 mmol/L (ref 0.0–2.0)
Bicarbonate: 24.1 mEq/L (ref 21.0–28.0)
FIO2: 28
LHR: 16 {breaths}/min
O2 Saturation: 96.9 %
PATIENT TEMPERATURE: 37
PEEP: 5 cmH2O
PO2 ART: 89 mmHg (ref 83.0–108.0)
VT: 500 mL
pCO2 arterial: 38 mmHg (ref 32.0–48.0)
pH, Arterial: 7.41 (ref 7.350–7.450)

## 2016-03-13 LAB — TYPE AND SCREEN
ABO/RH(D): A NEG
Antibody Screen: NEGATIVE
UNIT DIVISION: 0
Unit division: 0
Unit division: 0
Unit division: 0

## 2016-03-13 LAB — GLUCOSE, CAPILLARY
GLUCOSE-CAPILLARY: 112 mg/dL — AB (ref 65–99)
GLUCOSE-CAPILLARY: 129 mg/dL — AB (ref 65–99)
Glucose-Capillary: 113 mg/dL — ABNORMAL HIGH (ref 65–99)
Glucose-Capillary: 114 mg/dL — ABNORMAL HIGH (ref 65–99)
Glucose-Capillary: 151 mg/dL — ABNORMAL HIGH (ref 65–99)
Glucose-Capillary: 168 mg/dL — ABNORMAL HIGH (ref 65–99)

## 2016-03-13 LAB — RENAL FUNCTION PANEL
ALBUMIN: 2 g/dL — AB (ref 3.5–5.0)
ALBUMIN: 2.1 g/dL — AB (ref 3.5–5.0)
ANION GAP: 5 (ref 5–15)
ANION GAP: 8 (ref 5–15)
ANION GAP: 10 (ref 5–15)
ANION GAP: 7 (ref 5–15)
Albumin: 2.1 g/dL — ABNORMAL LOW (ref 3.5–5.0)
Albumin: 2.2 g/dL — ABNORMAL LOW (ref 3.5–5.0)
BUN: 20 mg/dL (ref 6–20)
BUN: 19 mg/dL (ref 6–20)
BUN: 18 mg/dL (ref 6–20)
BUN: 16 mg/dL (ref 6–20)
CALCIUM: 8.3 mg/dL — AB (ref 8.9–10.3)
CALCIUM: 8.3 mg/dL — AB (ref 8.9–10.3)
CHLORIDE: 107 mmol/L (ref 101–111)
CO2: 25 mmol/L (ref 22–32)
CO2: 23 mmol/L (ref 22–32)
CO2: 23 mmol/L (ref 22–32)
CO2: 26 mmol/L (ref 22–32)
Calcium: 8.3 mg/dL — ABNORMAL LOW (ref 8.9–10.3)
Calcium: 8.4 mg/dL — ABNORMAL LOW (ref 8.9–10.3)
Chloride: 109 mmol/L (ref 101–111)
Chloride: 108 mmol/L (ref 101–111)
Chloride: 107 mmol/L (ref 101–111)
Creatinine, Ser: 0.59 mg/dL — ABNORMAL LOW (ref 0.61–1.24)
Creatinine, Ser: 0.66 mg/dL (ref 0.61–1.24)
Creatinine, Ser: 0.64 mg/dL (ref 0.61–1.24)
Creatinine, Ser: 0.63 mg/dL (ref 0.61–1.24)
GFR calc Af Amer: >60 mL/min (ref >60)
GFR calc non Af Amer: >60 mL/min (ref >60)
GFR calc non Af Amer: >60 mL/min (ref >60)
GFR calc non Af Amer: >60 mL/min (ref >60)
GLUCOSE: 134 mg/dL — AB (ref 65–99)
Glucose, Bld: 128 mg/dL — ABNORMAL HIGH (ref 65–99)
Glucose, Bld: 168 mg/dL — ABNORMAL HIGH (ref 65–99)
Glucose, Bld: 185 mg/dL — ABNORMAL HIGH (ref 65–99)
PHOSPHORUS: 2.4 mg/dL — AB (ref 2.5–4.6)
PHOSPHORUS: 2.3 mg/dL — AB (ref 2.5–4.6)
POTASSIUM: 4.6 mmol/L (ref 3.5–5.1)
POTASSIUM: 4.4 mmol/L (ref 3.5–5.1)
POTASSIUM: 4.6 mmol/L (ref 3.5–5.1)
Phosphorus: 2.4 mg/dL — ABNORMAL LOW (ref 2.5–4.6)
Phosphorus: 2.8 mg/dL (ref 2.5–4.6)
Potassium: 4.4 mmol/L (ref 3.5–5.1)
SODIUM: 140 mmol/L (ref 135–145)
Sodium: 139 mmol/L (ref 135–145)
Sodium: 139 mmol/L (ref 135–145)
Sodium: 140 mmol/L (ref 135–145)

## 2016-03-13 LAB — BODY FLUID CELL COUNT WITH DIFFERENTIAL
Eos, Fluid: 0 %
Lymphs, Fluid: 8 %
Monocyte-Macrophage-Serous Fluid: 2 %
NEUTROPHIL FLUID: 90 %
OTHER CELLS FL: 0 %
Total Nucleated Cell Count, Fluid: 19676 cu mm

## 2016-03-13 LAB — PREPARE RBC (CROSSMATCH)

## 2016-03-13 LAB — MAGNESIUM
Magnesium: 1.7 mg/dL (ref 1.7–2.4)
Magnesium: 1.6 mg/dL — ABNORMAL LOW (ref 1.7–2.4)

## 2016-03-13 MED ORDER — HYDROMORPHONE HCL 1 MG/ML IJ SOLN
1.0000 mg | Freq: Two times a day (BID) | INTRAMUSCULAR | Status: AC
  Administered 2016-03-13 – 2016-03-14 (×3): 1 mg via INTRAVENOUS
  Filled 2016-03-13 (×3): qty 1

## 2016-03-13 MED ORDER — SODIUM CHLORIDE 0.9% FLUSH: 10.0000 mL | INTRAVENOUS | Status: DC | PRN

## 2016-03-13 MED ORDER — LIDOCAINE HCL (PF) 1 % IJ SOLN
5.0000 mL | Freq: Once | INTRAMUSCULAR | Status: AC
  Administered 2016-03-13: 5 mL via INTRADERMAL
  Filled 2016-03-13: qty 5

## 2016-03-13 MED ORDER — SODIUM CHLORIDE 0.9% FLUSH
10.0000 mL | Freq: Two times a day (BID) | INTRAVENOUS | Status: DC
  Administered 2016-03-13 – 2016-03-19 (×9): 10 mL

## 2016-03-13 NOTE — Progress Notes (Signed)
Vascular wellness at bedside to place PICC.

## 2016-03-13 NOTE — Progress Notes (Signed)
Subjective:   Wife at bedside. Tracheostomy yesterday. Held CRRT during the day due to procedure yesterday.  Hypotensive this morning.   UOP 955  Objective:  Vital signs in last 24 hours:  Temp:  [94.7 F (34.8 C)-98.2 F (36.8 C)] 97.7 F (36.5 C) (04/18 0800) Pulse Rate:  [55-88] 70 (04/18 0800) Resp:  [16-30] 29 (04/18 0800) BP: (94-150)/(48-77) 112/56 mmHg (04/18 0800) SpO2:  [95 %-100 %] 100 % (04/18 0800) FiO2 (%):  [28 %] 28 % (04/18 0800) Weight:  [99.2 kg (218 lb 11.1 oz)] 99.2 kg (218 lb 11.1 oz) (04/18 0500)  Weight change: -5.7 kg (-12 lb 9.1 oz) Filed Weights   03/11/16 0345 02/29/2016 0500 03/13/16 0500  Weight: 103.8 kg (228 lb 13.4 oz) 104.9 kg (231 lb 4.2 oz) 99.2 kg (218 lb 11.1 oz)    Intake/Output:    Intake/Output Summary (Last 24 hours) at 03/13/16 1043 Last data filed at 03/13/16 0900  Gross per 24 hour  Intake 956.15 ml  Output   3793 ml  Net -2836.85 ml     Physical Exam: General: Critically ill appearing   HEENT ETT  Neck Trachea midling  Pulm/lungs Vent assisted, bilateral rhonchi, PRVC FiO2 28%  CVS/Heart tachycardic, soft systolic murmur  Abdomen:  Soft, non distended, BS present  Extremities: ++ peripheral edema,    Neurologic: Sedated, intubated  Skin: Heel skin breakdown  GU: Foley  Access:  Right femoral Vas-Cath/Dr. Lucky Cowboy 4/9    Basic Metabolic Panel:   Recent Labs Lab 03/11/16 0348  03/11/16 1557  02/25/2016 0549 03/06/2016 7353 03/03/2016 1512 03/15/2016 1951 03/13/2016 2108 03/13/16 0454 03/13/16 0942  NA 137  < > 137  < > 138 138 137  --  137 139 139  K 4.4  < > 4.7  < > 4.5 4.7 4.7  --  4.3 4.6 4.4  CL 106  < > 107  < > 107 108 108  --  108 109 108  CO2 28  < > 26  < > 30 29 25   --  26 25 23   GLUCOSE 166*  < > 221*  < > 149* 159* 172*  --  118* 128* 134*  BUN 32*  < > 33*  < > 29* 29* 30*  --  24* 20 19  CREATININE 0.68  < > 0.78  < > 0.69 0.66 0.70  --  0.69 0.59* 0.66  CALCIUM 8.2*  < > 8.2*  < > 8.5* 8.7* 8.4*  --   8.3* 8.3* 8.3*  MG 1.7  --  1.6*  --  1.9  --   --  1.7  --  1.7  --   PHOS 3.1  < > 2.7  < >  --  2.7 3.1  --  2.4* 2.4* 2.4*  < > = values in this interval not displayed.   CBC:  Recent Labs Lab 03/09/16 0419 03/10/16 0426 03/11/16 0348 03/11/16 2155 03/23/2016 0549  WBC 10.1 7.8 8.2 6.3 7.8  HGB 7.4* 7.3* 7.3* 6.6* 9.3*  HCT 22.0* 22.0* 22.0* 20.4* 27.3*  MCV 92.9 93.5 93.2 92.7 92.2  PLT 84* 75* 73* 66* 69*      Microbiology:  Recent Results (from the past 720 hour(s))  Blood culture (routine x 2)     Status: Abnormal   Collection Time: 03/08/2016 12:59 PM  Result Value Ref Range Status   Specimen Description BLOOD LEFT FOREARM  Final   Special Requests BOTTLES DRAWN AEROBIC AND ANAEROBIC  1CC  Final   Culture  Setup Time   Final    GRAM POSITIVE COCCI AEROBIC BOTTLE ONLY CRITICAL VALUE NOTED.  VALUE IS CONSISTENT WITH PREVIOUSLY REPORTED AND CALLED VALUE.    Culture STREPTOCOCCUS PYOGENES AEROBIC BOTTLE ONLY  (A)  Final   Report Status 03/03/2016 FINAL  Final   Organism ID, Bacteria STREPTOCOCCUS PYOGENES  Final      Susceptibility   Streptococcus pyogenes - MIC*    CLINDAMYCIN Value in next row Sensitive      SENSITIVE0.25    AMPICILLIN Value in next row Sensitive      SENSITIVE0.25    ERYTHROMYCIN Value in next row Sensitive      SENSITIVE0.12    VANCOMYCIN Value in next row Sensitive      SENSITIVE0.5    CEFTRIAXONE Value in next row Sensitive      SENSITIVE0.12    LEVOFLOXACIN Value in next row Sensitive      SENSITIVE0.5    * STREPTOCOCCUS PYOGENES  Rapid Influenza A&B Antigens ([facility] only)     Status: None   Collection Time: 03/13/2016  1:00 PM  Result Value Ref Range Status   Influenza A ([facility]) NEGATIVE NEGATIVE Final   Influenza B ([facility]) NEGATIVE NEGATIVE Final  Blood culture (routine x 2)     Status: None   Collection Time: 03/25/2016  1:40 PM  Result Value Ref Range Status   Specimen Description BLOOD LEFT FOREARM  Final   Special Requests  BOTTLES DRAWN AEROBIC AND ANAEROBIC  4CC  Final   Culture  Setup Time   Final    GRAM POSITIVE COCCI IN BOTH AEROBIC AND ANAEROBIC BOTTLES CRITICAL RESULT CALLED TO, READ BACK BY AND VERIFIED WITH: NATE COOKSON AT 0426 02/27/16 CAF    Culture   Final    STREPTOCOCCUS PYOGENES IN BOTH AEROBIC AND ANAEROBIC BOTTLES    Report Status 02/29/2016 FINAL  Final   Organism ID, Bacteria STREPTOCOCCUS PYOGENES  Final      Susceptibility   Streptococcus pyogenes - MIC*    CLINDAMYCIN Value in next row Sensitive      SENSITIVE<=0.25    AMPICILLIN Value in next row Sensitive      SENSITIVE<=0.25    ERYTHROMYCIN Value in next row Sensitive      SENSITIVE0.12    VANCOMYCIN Value in next row Sensitive      SENSITIVE0.5    CEFTRIAXONE Value in next row Sensitive      SENSITIVE0.12    LEVOFLOXACIN Value in next row Sensitive      SENSITIVE0.5    * STREPTOCOCCUS PYOGENES  Blood Culture ID Panel (Reflexed)     Status: Abnormal   Collection Time: 03/18/2016  1:40 PM  Result Value Ref Range Status   Enterococcus species NOT DETECTED NOT DETECTED Final   Vancomycin resistance NOT DETECTED NOT DETECTED Final   Listeria monocytogenes NOT DETECTED NOT DETECTED Final   Staphylococcus species NOT DETECTED NOT DETECTED Final   Staphylococcus aureus NOT DETECTED NOT DETECTED Final   Methicillin resistance NOT DETECTED NOT DETECTED Final   Streptococcus species NOT DETECTED NOT DETECTED Final   Streptococcus agalactiae NOT DETECTED NOT DETECTED Final   Streptococcus pneumoniae NOT DETECTED NOT DETECTED Final   Streptococcus pyogenes DETECTED (A) NOT DETECTED Final    Comment: CRITICAL RESULT CALLED TO, READ BACK BY AND VERIFIED WITH: NATE COOKSON AT 0426 02/27/16 CAF    Acinetobacter baumannii NOT DETECTED NOT DETECTED Final   Enterobacteriaceae species NOT DETECTED NOT DETECTED Final  Enterobacter cloacae complex NOT DETECTED NOT DETECTED Final   Escherichia coli NOT DETECTED NOT DETECTED Final    Klebsiella oxytoca NOT DETECTED NOT DETECTED Final   Klebsiella pneumoniae NOT DETECTED NOT DETECTED Final   Proteus species NOT DETECTED NOT DETECTED Final   Serratia marcescens NOT DETECTED NOT DETECTED Final   Carbapenem resistance NOT DETECTED NOT DETECTED Final   Haemophilus influenzae NOT DETECTED NOT DETECTED Final   Neisseria meningitidis NOT DETECTED NOT DETECTED Final   Pseudomonas aeruginosa NOT DETECTED NOT DETECTED Final   Candida albicans NOT DETECTED NOT DETECTED Final   Candida glabrata NOT DETECTED NOT DETECTED Final   Candida krusei NOT DETECTED NOT DETECTED Final   Candida parapsilosis NOT DETECTED NOT DETECTED Final   Candida tropicalis NOT DETECTED NOT DETECTED Final  Body fluid culture     Status: None   Collection Time: 03/13/2016  3:05 PM  Result Value Ref Range Status   Specimen Description R Knee  Final   Special Requests NONE  Final   Gram Stain   Final    MODERATE WBC SEEN MODERATE GRAM POSITIVE COCCI IN PAIRS AND CHAINS    Culture   Final    HEAVY GROWTH GROUP A STREP (S.PYOGENES) ISOLATED There is no known Penicillin Resistant Beta Streptococcus in the [place] For patients that are Penicillin-allergic, Erythromycin is 85-94% susceptible, and Clindamycin is 80% susceptible.  Contact Microbiology within 7 days if sensitivity testing is  required.   NO ANAEROBES ISOLATED    Report Status 02/29/2016 FINAL  Final  Body fluid culture     Status: None   Collection Time: 03/22/2016  4:10 PM  Result Value Ref Range Status   Specimen Description R Knee  Final   Special Requests NONE  Final   Gram Stain   Final    MANY WBC SEEN MANY GRAM POSITIVE COCCI IN PAIRS AND CHAINS    Culture   Final    HEAVY GROWTH GROUP A STREP (S.PYOGENES) ISOLATED There is no known Penicillin Resistant Beta Streptococcus in the [place] For patients that are Penicillin-allergic, Erythromycin is 85-94% susceptible, and Clindamycin is 80% susceptible.  Contact Microbiology within 7 days if  sensitivity testing is  required.   NO ANAEROBES ISOLATED    Report Status 02/29/2016 FINAL  Final  MRSA PCR Screening     Status: None   Collection Time: 02/27/16  3:35 AM  Result Value Ref Range Status   MRSA by PCR NEGATIVE NEGATIVE Final    Comment:        The GeneXpert MRSA Assay (FDA approved for NASAL specimens only), is one component of a comprehensive MRSA colonization surveillance program. It is not intended to diagnose MRSA infection nor to guide or monitor treatment for MRSA infections.   Urine culture     Status: None   Collection Time: 02/27/16  8:30 AM  Result Value Ref Range Status   Specimen Description URINE, RANDOM  Final   Special Requests NONE  Final   Culture NO GROWTH 1 DAY  Final   Report Status 02/28/2016 FINAL  Final  Culture, blood (single) w Reflex to PCR ID Panel     Status: None   Collection Time: 02/29/16  2:36 PM  Result Value Ref Range Status   Specimen Description BLOOD LEFT HAND  Final   Special Requests BOTTLES DRAWN AEROBIC AND ANAEROBIC [place]  Final   Culture NO GROWTH 5 DAYS  Final   Report Status 03/05/2016 FINAL  Final  C difficile quick  scan w PCR reflex     Status: None   Collection Time: 03/06/16  5:37 PM  Result Value Ref Range Status   C Diff antigen NEGATIVE NEGATIVE Final   C Diff toxin NEGATIVE NEGATIVE Final   C Diff interpretation Negative for C. difficile  Final  Culture, respiratory (NON-Expectorated)     Status: None   Collection Time: 03/07/16  8:20 AM  Result Value Ref Range Status   Specimen Description TRACHEAL ASPIRATE  Final   Special Requests NONE  Final   Gram Stain   Final    MODERATE WBC SEEN FEW SQUAMOUS EPITHELIAL CELLS PRESENT RARE YEAST    Culture Consistent with normal respiratory flora.  Final   Report Status 03/09/2016 FINAL  Final  Culture, respiratory (NON-Expectorated)     Status: None   Collection Time: 03/08/16  4:52 PM  Result Value Ref Range Status   Specimen Description  TRACHEAL ASPIRATE  Final   Special Requests NONE  Final   Gram Stain NO ORGANISMS SEEN  Final   Culture Consistent with normal respiratory flora.  Final   Report Status 03/10/2016 FINAL  Final    Coagulation Studies: No results for input(s): LABPROT, INR in the last 72 hours.  Urinalysis: No results for input(s): COLORURINE, LABSPEC, PHURINE, GLUCOSEU, HGBUR, BILIRUBINUR, KETONESUR, PROTEINUR, UROBILINOGEN, NITRITE, LEUKOCYTESUR in the last 72 hours.  Invalid input(s): APPERANCEUR    Imaging: Dg Chest 1 View  03/15/2016  CLINICAL DATA:  75 year old male with dyspnea. Subsequent encounter. EXAM: CHEST 1 VIEW COMPARISON:  03/11/2016 FINDINGS: Endotracheal tube tip 1.2 cm above the carina. Right central line tip proximal superior vena cava level. Nasogastric tube curled within the stomach tip at the level of the gastric fundus. Cardiomegaly. Calcified tortuous aorta. No significant change in bibasilar consolidation may represent combination of pleural effusions, atelectasis and/ or infiltrates. Pulmonary vascular congestion/pulmonary edema. IMPRESSION: No significant change in bibasilar consolidation may represent combination of pleural effusions, atelectasis and/ or infiltrates. Pulmonary vascular congestion/pulmonary edema. Endotracheal tube tip 1.2 cm above the carina. Cardiomegaly. Electronically Signed   By: Genia Del M.D.   On: 03/13/2016 07:46     Medications:   . alteplase    . dexmedetomidine 0.4 mcg/kg/hr (03/13/16 1041)  . feeding supplement (VITAL AF 1.2 CAL) Stopped (02/29/2016 0005)  . phenylephrine (NEO-SYNEPHRINE) Adult infusion 10 mcg/min (03/13/16 1041)  . pureflow 3 each (03/01/2016 2134)   . antiseptic oral rinse  7 mL Mouth Rinse QID  . aspirin  325 mg Per Tube Daily  . budesonide (PULMICORT) nebulizer solution  0.25 mg Nebulization BID  . chlorhexidine gluconate (SAGE KIT)  15 mL Mouth Rinse BID  . famotidine  20 mg Per Tube Daily  . feeding supplement (PRO-STAT  SUGAR FREE 64)  30 mL Oral QID  . insulin aspart  0-15 Units Subcutaneous 6 times per day  . ipratropium-albuterol  3 mL Nebulization Q6H  . pencillin G potassium IV  3 Million Units Intravenous 4 times per day  . pravastatin  40 mg Per Tube q1800   acetaminophen **OR** [DISCONTINUED] acetaminophen, albuterol, alteplase, fentaNYL (SUBLIMAZE) injection, heparin, midazolam, ondansetron **OR** ondansetron (ZOFRAN) IV, sodium chloride flush  Assessment/ Plan:  75 y.o. white  male  with coronary disease with four-vessel CABG in 1998, gallbladder rupture,  Diabetes with complications of retinopathy, peripheral neuropathy, peripheral vascular disease, Aorto iliac bypass, angioplasty and stent in his left leg, kyphoplasty for chronic back pain, CKD st 3 with Baseline Cr 1.5/ GFR 46  1.  Acute renal failure on chronic kidney disease stage III: baseline creatinine of 1.5, eGFR of 46. Chronic kidney disease secondary to diabetic nephropathy. Now requiring CRRT. On vasopressors. Nonoliguric urine output.  Complicated acute renal failure with Anasarca, hypotension, metabolic acidosis, hyponatremia - Continue CRRT, increase BFR to 300.  - IV albumin  2. Rt Knee septic arthritis with sepsis: strep pyogenes.  - penicillin G   3. Acute resp faliure. Intubated 4/3, extubated and re-intubated 4/13.   4. Anemia with kidney failure: status post transfusion 4/16.    LOS: Sanders, Jaydeen Odor 4/18/201710:43 AM

## 2016-03-13 NOTE — Progress Notes (Signed)
Patient continues to grimace and appears uncomfortable, versed 1mg   and fentanyl 53mcg administered with minimal relief. Precedex gtt increased will continue to monitor for changes/need.

## 2016-03-13 NOTE — Care Management (Signed)
Erby Pian, Clinical Liason for [facility] here to assess Mr Grandt for admission into their facility. Not ready yet per Ms. Pike. Shelbie Ammons RN MSN CCM Care Management   [phone]

## 2016-03-13 NOTE — Progress Notes (Signed)
Right IJ triple lumen removed per policy and procedure.  Blue end tip intact.  Patient tolerated well.  Guaze applied to site and covered with Tegaderm.

## 2016-03-13 NOTE — Progress Notes (Signed)
Triple lumen right upper arm PICC placed by [facility]. Chest x-ray pending.

## 2016-03-13 NOTE — Consult Note (Signed)
GI Inpatient Consult Note  Reason for Consult: PEG tube placement   Attending Requesting Consult: Lindwood Qua, NP  History of Present Illness: John Schultz is a 75 y.o. male with a history of chronic diastolic HF, CKD, and DM II admitted since 03/04/2016 with severe sepsis secondary to septic R knee.  He decompensated withint 24 ours of admission, requiring intubation on 02/27/16.  Extubation was attempted on 4/13, but patient required re-intubation later that day due to post-extubation stridor and cough. He remains intubated and sedated at this time.  Tracheostomy was placed per ENT yesterday.  Patient has received EN since admission.  Consultation for PEG tube placement was placed due to patient's continued need for long-term ventilation.  Past Medical History:  Past Medical History  Diagnosis Date  . CHF (congestive heart failure) ([place])   . DM (diabetes mellitus) ([place])   . Pancreatic cyst   . Skin cancer   . DDD (degenerative disc disease), thoracic   . Renal failure   . Respiratory failure ([place])     Problem List: Patient Active Problem List   Diagnosis Date Noted  . Stupor   . NSTEMI (non-ST elevated myocardial infarction) ([place]) 02/27/2016  . Pressure ulcer 02/27/2016  . Septic shock ()   . Acute respiratory failure ([place])   . Septic arthritis of knee, right ([place]) 03/07/2016  . ARF (acute renal failure) ([place]) 03/25/2016  . Acute bronchitis 03/13/2016  . Hyponatremia 03/04/2016    Past Surgical History: Past Surgical History  Procedure Laterality Date  . Aortoiliac bypass    . Cholecystectomy    . Kyphoplasty    . Tracheostomy tube placement N/A 02/29/2016    Procedure: TRACHEOSTOMY;  Surgeon: Carloyn Manner, MD;  Location: [facility] ORS;  Service: ENT;  Laterality: N/A;    Allergies: Allergies  Allergen Reactions  . Tape Rash    Home Medications: Prescriptions prior to admission  Medication Sig Dispense Refill Last Dose  . acetaminophen (TYLENOL) 500 MG tablet  Take 1,000 mg by mouth every 8 (eight) hours as needed for mild pain, moderate pain or fever.   03/05/2016 at Unknown time  . alendronate (FOSAMAX) 70 MG tablet Take 70 mg by mouth once a week. Take with a full glass of water on an empty stomach. Take on Sunday.   02/19/2016  . amLODipine (NORVASC) 5 MG tablet Take 5 mg by mouth daily.   02/24/2016  . aspirin 81 MG EC tablet Take 81 mg by mouth daily. Swallow whole.   02/24/2016  . B Complex Vitamins (VITAMIN B-COMPLEX) TABS Take 1 tablet by mouth daily.   02/24/2016  . calcium-vitamin D (OSCAL WITH D) 500-200 MG-UNIT tablet Take 1 tablet by mouth daily.   02/24/2016  . gabapentin (NEURONTIN) 300 MG capsule Take 300-600 mg by mouth See admin instructions. Take 1 capsule by mouth in the morning, Take 1 capsule by mouth at lunch, and take 2 capsules (68m) by mouth every night at bedtime.   02/24/2016  . insulin aspart (NOVOLOG) 100 UNIT/ML FlexPen Inject 5 Units into the skin 3 (three) times daily with meals. **If <80 do not take! If >200 use 2 units every 50 over 200.**   03/01/2016 at Unknown time  . insulin glargine (LANTUS) 100 unit/mL SOPN Inject 15 Units into the skin every evening.   02/23/2016  . isosorbide mononitrate (IMDUR) 30 MG 24 hr tablet Take 30 mg by mouth daily.   02/24/2016  . lovastatin (MEVACOR) 40 MG tablet Take 80 mg by  mouth at bedtime.   02/23/2016  . metFORMIN (GLUCOPHAGE) 500 MG tablet Take 1,500 mg by mouth daily with supper.   02/23/2016  . Multiple Vitamin (MULTIVITAMIN) tablet Take 1 tablet by mouth daily.   02/24/2016  . torsemide (DEMADEX) 20 MG tablet Take 40 mg by mouth every morning.   02/24/2016  . valsartan (DIOVAN) 160 MG tablet Take 80-160 mg by mouth See admin instructions. Take 1/2 tablet (86m) by mouth in the morning, and 1 tablet by mouth every night at bedtime.   02/24/2016   Home medication reconciliation was completed with the patient.   Scheduled Inpatient Medications:   . antiseptic oral rinse  7 mL Mouth Rinse  QID  . aspirin  325 mg Per Tube Daily  . budesonide (PULMICORT) nebulizer solution  0.25 mg Nebulization BID  . chlorhexidine gluconate (SAGE KIT)  15 mL Mouth Rinse BID  . famotidine  20 mg Per Tube Daily  .  HYDROmorphone (DILAUDID) injection  1 mg Intravenous BID  . insulin aspart  0-15 Units Subcutaneous 6 times per day  . ipratropium-albuterol  3 mL Nebulization Q6H  . pencillin G potassium IV  3 Million Units Intravenous 4 times per day  . pravastatin  40 mg Per Tube q1800    Continuous Inpatient Infusions:   . alteplase    . dexmedetomidine 0.4 mcg/kg/hr (03/13/16 1041)  . phenylephrine (NEO-SYNEPHRINE) Adult infusion 10 mcg/min (03/13/16 1041)  . pureflow 3 each (03/19/2016 2134)    PRN Inpatient Medications:  acetaminophen **OR** [DISCONTINUED] acetaminophen, albuterol, alteplase, fentaNYL (SUBLIMAZE) injection, heparin, midazolam, ondansetron **OR** ondansetron (ZOFRAN) IV, sodium chloride flush  Family History: family history includes CAD in his mother; Colon cancer in his brother; Hypertension in his father and mother; Prostate cancer in his father.    Social History:   reports that he has never smoked. He has never used smokeless tobacco. He reports that he does not drink alcohol or use illicit drugs.   Review of Systems: Unable to perform - patient intubed and sedated    Physical Examination: BP 96/50 mmHg  Pulse 65  Temp(Src) 97.7 F (36.5 C) (Oral)  Resp 19  Ht 6' (1.829 m)  Wt 99.2 kg (218 lb 11.1 oz)  BMI 29.65 kg/m2  SpO2 100% Gen: Patient intubated and sedated, appears critically ill HEENT: PEERLA Neck: supple, no JVD or thyromegaly Chest: Coarse breath sounds bilaterally, bilateral rhonchi, no wheezes CV: RRR, soft systolic murmur noted, no g/c/r,  Abd: soft, NT, ND, +BS in all four quadrants; no HSM, guarding, ridigity, or rebound tenderness Ext: no edema, 2+ peripheral edema Skin: L knee with mild erythema Lymph: no LAD  Data: Lab Results   Component Value Date   WBC 7.8 03/02/2016   HGB 9.3* 03/22/2016   HCT 27.3* 03/07/2016   MCV 92.2 03/16/2016   PLT 69* 03/04/2016    Recent Labs Lab 03/11/16 0348 03/11/16 2155 03/25/2016 0549  HGB 7.3* 6.6* 9.3*   Lab Results  Component Value Date   NA 139 03/13/2016   K 4.4 03/13/2016   CL 108 03/13/2016   CO2 23 03/13/2016   BUN 19 03/13/2016   CREATININE 0.66 03/13/2016   Lab Results  Component Value Date   ALT 21 03/10/2016   AST 39 03/10/2016   ALKPHOS 93 03/10/2016   BILITOT 0.6 03/10/2016   No results for input(s): APTT, INR, PTT in the last 168 hours.   Assessment/Plan: Mr. MDelisis a 75y.o. male with a history of chronic  diastolic HF, CKD, and DM II admitted since 03/04/2016 with severe sepsis secondary to septic R knee.  He decompensated withint 24 ours of admission requiring intubation.  Extubation on 4/13 was unsuccessful, so tracheostomy was placed yesterday.  Recommend PEG tube placement due to patient's need for long-term ventilation support.  Further recs per Dr. Rayann Heman.  Recommendations: - PEG placement per Dr. Rayann Heman  Thank you for the consult. We will follow along with you. Please call with questions or concerns.  Lavera Guise, PA-C [facility] Phone: [phone] Pager: [phone]

## 2016-03-13 NOTE — Progress Notes (Signed)
[facility]FECT[facility] PROGRESS NOTE Date of Admission:  03/15/2016     ID: John Schultz is a 75 y.o. male with  Grp A strep septic R knee and bacteremia  Principal Problem:   Septic arthritis of knee, right (HCC) Active Problems:   ARF (acute renal failure) (HCC)   Acute bronchitis   Hyponatremia   NSTEMI (non-ST elevated myocardial infarction) (HCC)   Pressure ulcer   Septic shock (HCC)   Acute respiratory failure (HCC)   Stupor   Subjective: Trach placed 4/17  Still on crrt, on pressors  ROS  lethargic  Medications:  Antibiotics Given (last 72 hours)    Date/Time Action Medication Dose Rate   03/10/16 1703 Given   penicillin G potassium 3 Million Units in dextrose 5 % 100 mL IVPB 3 Million Units 200 mL/hr   03/10/16 2334 Given   penicillin G potassium 3 Million Units in dextrose 5 % 100 mL IVPB 3 Million Units 200 mL/hr   03/11/16 0523 Given   penicillin G potassium 3 Million Units in dextrose 5 % 100 mL IVPB 3 Million Units 200 mL/hr   03/11/16 1123 Given   penicillin G potassium 3 Million Units in dextrose 5 % 100 mL IVPB 3 Million Units 200 mL/hr   03/11/16 1705 Given   penicillin G potassium 3 Million Units in dextrose 5 % 100 mL IVPB 3 Million Units 200 mL/hr   02/27/2016 0025 Given   penicillin G potassium 3 Million Units in dextrose 5 % 100 mL IVPB 3 Million Units 200 mL/hr   03/21/2016 0620 Given   penicillin G potassium 3 Million Units in dextrose 5 % 100 mL IVPB 3 Million Units 200 mL/hr   03/13/2016 1428 Given   penicillin G potassium 3 Million Units in dextrose 5 % 100 mL IVPB 3 Million Units 200 mL/hr   03/13/2016 1801 Given   penicillin G potassium 3 Million Units in dextrose 5 % 100 mL IVPB 3 Million Units 200 mL/hr   03/13/16 0059 Given   penicillin G potassium 3 Million Units in dextrose 5 % 100 mL IVPB 3 Million Units 200 mL/hr   03/13/16 6301 Given   penicillin G potassium 3 Million Units in dextrose 5 % 100 mL IVPB 3 Million Units 200  mL/hr   03/13/16 1214 Given   penicillin G potassium 3 Million Units in dextrose 5 % 100 mL IVPB 3 Million Units 200 mL/hr     . antiseptic oral rinse  7 mL Mouth Rinse QID  . aspirin  325 mg Per Tube Daily  . budesonide (PULMICORT) nebulizer solution  0.25 mg Nebulization BID  . chlorhexidine gluconate (SAGE KIT)  15 mL Mouth Rinse BID  . famotidine  20 mg Per Tube Daily  .  HYDROmorphone (DILAUDID) injection  1 mg Intravenous BID  . insulin aspart  0-15 Units Subcutaneous 6 times per day  . ipratropium-albuterol  3 mL Nebulization Q6H  . pencillin G potassium IV  3 Million Units Intravenous 4 times per day  . pravastatin  40 mg Per Tube q1800  . sodium chloride flush  10-40 mL Intracatheter Q12H    Objective: Vital signs in last 24 hours: Temp:  [94.7 F (34.8 C)-98.2 F (36.8 C)] 97.7 F (36.5 C) (04/18 0800) Pulse Rate:  [55-88] 65 (04/18 1430) Resp:  [16-30] 21 (04/18 1430) BP: (83-150)/(43-77) 103/57 mmHg (04/18 1430) SpO2:  [97 %-100 %] 100 % (04/18 1430) FiO2 (%):  [28 %] 28 % (  04/18 1430) Weight:  [99.2 kg (218 lb 11.1 oz)] 99.2 kg (218 lb 11.1 oz) (04/18 0500) Constitutional: lethargic, critically ill appearing.  HENT: perrla, eomi Mouth/Throat: Oropharynx - etet in place  Cardiovascular: tachy, reg Pulmonary/Chest bil rhonchi Abdominal: Soft. Bowel sounds are normal. He exhibits no distension. There is no tenderness.  Lymphadenopathy: He has no cervical adenopathy.  Neurological:lethargic   Skin: Skin is warm and dry. No rash noted. No erythema.  Psychiatric: sedated EXT edematous Ext - L knee with some pain with movement, mild warmth  Lab Results  Recent Labs  03/11/16 2155  02/27/2016 0549  03/13/16 0454 03/13/16 0942  WBC 6.3  --  7.8  --   --   --   HGB 6.6*  --  9.3*  --   --   --   HCT 20.4*  --  27.3*  --   --   --   NA 137  < > 138  < > 139 139  K 4.3  < > 4.5  < > 4.6 4.4  CL 107  < > 107  < > 109 108  CO2 28  < > 30  < > 25 23  BUN 33*   < > 29*  < > 20 19  CREATININE 0.79  < > 0.69  < > 0.59* 0.66  < > = values in this interval not displayed.  Microbiology: Results for orders placed or performed during the hospital encounter of 03/23/2016  Blood culture (routine x 2)     Status: Abnormal   Collection Time: 03/11/2016 12:59 PM  Result Value Ref Range Status   Specimen Description BLOOD LEFT FOREARM  Final   Special Requests BOTTLES DRAWN AEROBIC AND ANAEROBIC  1CC  Final   Culture  Setup Time   Final    GRAM POSITIVE COCCI AEROBIC BOTTLE ONLY CRITICAL VALUE NOTED.  VALUE IS CONSISTENT WITH PREVIOUSLY REPORTED AND CALLED VALUE.    Culture STREPTOCOCCUS PYOGENES AEROBIC BOTTLE ONLY  (A)  Final   Report Status 03/03/2016 FINAL  Final   Organism ID, Bacteria STREPTOCOCCUS PYOGENES  Final      Susceptibility   Streptococcus pyogenes - MIC*    CLINDAMYCIN Value in next row Sensitive      SENSITIVE0.25    AMPICILLIN Value in next row Sensitive      SENSITIVE0.25    ERYTHROMYCIN Value in next row Sensitive      SENSITIVE0.12    VANCOMYCIN Value in next row Sensitive      SENSITIVE0.5    CEFTRIAXONE Value in next row Sensitive      SENSITIVE0.12    LEVOFLOXACIN Value in next row Sensitive      SENSITIVE0.5    * STREPTOCOCCUS PYOGENES  Rapid Influenza A&B Antigens ([facility] only)     Status: None   Collection Time: 03/08/2016  1:00 PM  Result Value Ref Range Status   Influenza A ([facility]) NEGATIVE NEGATIVE Final   Influenza B ([facility]) NEGATIVE NEGATIVE Final  Blood culture (routine x 2)     Status: None   Collection Time: 03/21/2016  1:40 PM  Result Value Ref Range Status   Specimen Description BLOOD LEFT FOREARM  Final   Special Requests BOTTLES DRAWN AEROBIC AND ANAEROBIC  4CC  Final   Culture  Setup Time   Final    GRAM POSITIVE COCCI IN BOTH AEROBIC AND ANAEROBIC BOTTLES CRITICAL RESULT CALLED TO, READ BACK BY AND VERIFIED WITH: NATE COOKSON AT 9381 02/27/16 CAF  Culture   Final    STREPTOCOCCUS PYOGENES IN BOTH AEROBIC  AND ANAEROBIC BOTTLES    Report Status 02/29/2016 FINAL  Final   Organism ID, Bacteria STREPTOCOCCUS PYOGENES  Final      Susceptibility   Streptococcus pyogenes - MIC*    CLINDAMYCIN Value in next row Sensitive      SENSITIVE<=0.25    AMPICILLIN Value in next row Sensitive      SENSITIVE<=0.25    ERYTHROMYCIN Value in next row Sensitive      SENSITIVE0.12    VANCOMYCIN Value in next row Sensitive      SENSITIVE0.5    CEFTRIAXONE Value in next row Sensitive      SENSITIVE0.12    LEVOFLOXACIN Value in next row Sensitive      SENSITIVE0.5    * STREPTOCOCCUS PYOGENES  Blood Culture ID Panel (Reflexed)     Status: Abnormal   Collection Time: 03/11/2016  1:40 PM  Result Value Ref Range Status   Enterococcus species NOT DETECTED NOT DETECTED Final   Vancomycin resistance NOT DETECTED NOT DETECTED Final   Listeria monocytogenes NOT DETECTED NOT DETECTED Final   Staphylococcus species NOT DETECTED NOT DETECTED Final   Staphylococcus aureus NOT DETECTED NOT DETECTED Final   Methicillin resistance NOT DETECTED NOT DETECTED Final   Streptococcus species NOT DETECTED NOT DETECTED Final   Streptococcus agalactiae NOT DETECTED NOT DETECTED Final   Streptococcus pneumoniae NOT DETECTED NOT DETECTED Final   Streptococcus pyogenes DETECTED (A) NOT DETECTED Final    Comment: CRITICAL RESULT CALLED TO, READ BACK BY AND VERIFIED WITH: NATE COOKSON AT 0426 02/27/16 CAF    Acinetobacter baumannii NOT DETECTED NOT DETECTED Final   Enterobacteriaceae species NOT DETECTED NOT DETECTED Final   Enterobacter cloacae complex NOT DETECTED NOT DETECTED Final   Escherichia coli NOT DETECTED NOT DETECTED Final   Klebsiella oxytoca NOT DETECTED NOT DETECTED Final   Klebsiella pneumoniae NOT DETECTED NOT DETECTED Final   Proteus species NOT DETECTED NOT DETECTED Final   Serratia marcescens NOT DETECTED NOT DETECTED Final   Carbapenem resistance NOT DETECTED NOT DETECTED Final   Haemophilus influenzae NOT  DETECTED NOT DETECTED Final   Neisseria meningitidis NOT DETECTED NOT DETECTED Final   Pseudomonas aeruginosa NOT DETECTED NOT DETECTED Final   Candida albicans NOT DETECTED NOT DETECTED Final   Candida glabrata NOT DETECTED NOT DETECTED Final   Candida krusei NOT DETECTED NOT DETECTED Final   Candida parapsilosis NOT DETECTED NOT DETECTED Final   Candida tropicalis NOT DETECTED NOT DETECTED Final  Body fluid culture     Status: None   Collection Time: 03/17/2016  3:05 PM  Result Value Ref Range Status   Specimen Description R Knee  Final   Special Requests NONE  Final   Gram Stain   Final    MODERATE WBC SEEN MODERATE GRAM POSITIVE COCCI IN PAIRS AND CHAINS    Culture   Final    HEAVY GROWTH GROUP A STREP (S.PYOGENES) ISOLATED There is no known Penicillin Resistant Beta Streptococcus in the [place] For patients that are Penicillin-allergic, Erythromycin is 85-94% susceptible, and Clindamycin is 80% susceptible.  Contact Microbiology within 7 days if sensitivity testing is  required.   NO ANAEROBES ISOLATED    Report Status 02/29/2016 FINAL  Final  Body fluid culture     Status: None   Collection Time: 03/05/2016  4:10 PM  Result Value Ref Range Status   Specimen Description R Knee  Final   Special Requests NONE  Final   Gram Stain   Final    MANY WBC SEEN MANY GRAM POSITIVE COCCI IN PAIRS AND CHAINS    Culture   Final    HEAVY GROWTH GROUP A STREP (S.PYOGENES) ISOLATED There is no known Penicillin Resistant Beta Streptococcus in the [place] For patients that are Penicillin-allergic, Erythromycin is 85-94% susceptible, and Clindamycin is 80% susceptible.  Contact Microbiology within 7 days if sensitivity testing is  required.   NO ANAEROBES ISOLATED    Report Status 02/29/2016 FINAL  Final  MRSA PCR Screening     Status: None   Collection Time: 02/27/16  3:35 AM  Result Value Ref Range Status   MRSA by PCR NEGATIVE NEGATIVE Final    Comment:        The GeneXpert MRSA Assay  (FDA approved for NASAL specimens only), is one component of a comprehensive MRSA colonization surveillance program. It is not intended to diagnose MRSA infection nor to guide or monitor treatment for MRSA infections.   Urine culture     Status: None   Collection Time: 02/27/16  8:30 AM  Result Value Ref Range Status   Specimen Description URINE, RANDOM  Final   Special Requests NONE  Final   Culture NO GROWTH 1 DAY  Final   Report Status 02/28/2016 FINAL  Final  Culture, blood (single) w Reflex to PCR ID Panel     Status: None   Collection Time: 02/29/16  2:36 PM  Result Value Ref Range Status   Specimen Description BLOOD LEFT HAND  Final   Special Requests BOTTLES DRAWN AEROBIC AND ANAEROBIC [place]  Final   Culture NO GROWTH 5 DAYS  Final   Report Status 03/05/2016 FINAL  Final  C difficile quick scan w PCR reflex     Status: None   Collection Time: 03/06/16  5:37 PM  Result Value Ref Range Status   C Diff antigen NEGATIVE NEGATIVE Final   C Diff toxin NEGATIVE NEGATIVE Final   C Diff interpretation Negative for C. difficile  Final  Culture, respiratory (NON-Expectorated)     Status: None   Collection Time: 03/07/16  8:20 AM  Result Value Ref Range Status   Specimen Description TRACHEAL ASPIRATE  Final   Special Requests NONE  Final   Gram Stain   Final    MODERATE WBC SEEN FEW SQUAMOUS EPITHELIAL CELLS PRESENT RARE YEAST    Culture Consistent with normal respiratory flora.  Final   Report Status 03/09/2016 FINAL  Final  Culture, respiratory (NON-Expectorated)     Status: None   Collection Time: 03/08/16  4:52 PM  Result Value Ref Range Status   Specimen Description TRACHEAL ASPIRATE  Final   Special Requests NONE  Final   Gram Stain NO ORGANISMS SEEN  Final   Culture Consistent with normal respiratory flora.  Final   Report Status 03/10/2016 FINAL  Final    Studies/Results: Dg Chest 1 View  03/13/2016  CLINICAL DATA:  PICC placement EXAM: CHEST 1 VIEW  COMPARISON:  Yesterday FINDINGS: Right upper extremity PICC with tip at the upper cavoatrial junction. Right IJ central line is unmoved. New tracheostomy tube and removal of orogastric tube. Persistent bibasilar lung opacity with low volumes. No pneumothorax or pneumomediastinum is seen. Chronic cardiomegaly. Status post CABG. IMPRESSION: 1. Right upper extremity PICC with tip at the upper cavoatrial junction. 2. New tracheostomy tube. 3. Low volume chest with bibasilar atelectasis or pneumonia. Electronically Signed   By: Monte Fantasia M.D.   On:  03/13/2016 12:39   Dg Chest 1 View  03/15/2016  CLINICAL DATA:  75 year old male with dyspnea. Subsequent encounter. EXAM: CHEST 1 VIEW COMPARISON:  03/11/2016 FINDINGS: Endotracheal tube tip 1.2 cm above the carina. Right central line tip proximal superior vena cava level. Nasogastric tube curled within the stomach tip at the level of the gastric fundus. Cardiomegaly. Calcified tortuous aorta. No significant change in bibasilar consolidation may represent combination of pleural effusions, atelectasis and/ or infiltrates. Pulmonary vascular congestion/pulmonary edema. IMPRESSION: No significant change in bibasilar consolidation may represent combination of pleural effusions, atelectasis and/ or infiltrates. Pulmonary vascular congestion/pulmonary edema. Endotracheal tube tip 1.2 cm above the carina. Cardiomegaly. Electronically Signed   By: Genia Del M.D.   On: 03/09/2016 07:46    Assessment/Plan: John Schultz is a 75 y.o. male with Group A strep septic knee (Synovial WBC > 300K), sepsis, hypotensive, intubated, no obvious sourse with no evidence cellulitis and no PNA on cxr.  Echo neg for vegetation Fu bcx 4/5 ngtd Dr Rudene Christians following for the knee. Started on CRRT 4/12 - increasing wbc and secretions. C diff neg. Sputum cx sent. CXR without much change 4/13- no fevers, wbc down a little. Extubated today  35 -= s/p trach 4/17- remains on crrt.    Recommendations Continue  pcn  Will ask Dr Rudene Christians to reeval knee and consider aspiration  Continue supportive care. Has multiple lines in place - if wbc continues to increase or febrile ->repeat bcx  Thank you very much for the consult. Will follow with you.  [place], DAVID P   03/13/2016, 2:40 PM

## 2016-03-13 NOTE — Addendum Note (Signed)
Addendum  created 03/13/16 0749 by Allean Found, CRNA   Modules edited: Clinical Notes   Clinical Notes:  File: [id number]; File: [id number]

## 2016-03-13 NOTE — Progress Notes (Signed)
.. 03/13/2016 12:22 PM  [place], Delfino Lovett [id number]  Post-Op Day 1    Temp:  [94.7 F (34.8 C)-98.2 F (36.8 C)] 97.7 F (36.5 C) (04/18 0800) Pulse Rate:  [55-88] 65 (04/18 1130) Resp:  [16-30] 19 (04/18 1130) BP: (83-150)/(43-77) 96/50 mmHg (04/18 1130) SpO2:  [95 %-100 %] 100 % (04/18 1130) FiO2 (%):  [28 %] 28 % (04/18 1130) Weight:  [99.2 kg (218 lb 11.1 oz)] 99.2 kg (218 lb 11.1 oz) (04/18 0500),     Intake/Output Summary (Last 24 hours) at 03/13/16 1222 Last data filed at 03/13/16 1000  Gross per 24 hour  Intake 941.15 ml  Output   3937 ml  Net -2995.85 ml    Results for orders placed or performed during the hospital encounter of 03/23/2016 (from the past 24 hour(s))  Renal function     Status: Abnormal   Collection Time: 03/22/2016  3:12 PM  Result Value Ref Range   Sodium 137 135 - 145 mmol/L   Potassium 4.7 3.5 - 5.1 mmol/L   Chloride 108 101 - 111 mmol/L   CO2 25 22 - 32 mmol/L   Glucose, Bld 172 (H) 65 - 99 mg/dL   BUN 30 (H) 6 - 20 mg/dL   Creatinine, Ser 0.70 0.61 - 1.24 mg/dL   Calcium 8.4 (L) 8.9 - 10.3 mg/dL   Phosphorus 3.1 2.5 - 4.6 mg/dL   Albumin 2.0 (L) 3.5 - 5.0 g/dL   GFR calc non Af Amer >60 >60 mL/min   GFR calc Af Amer >60 >60 mL/min   Anion gap 4 (L) 5 - 15  Glucose, capillary     Status: Abnormal   Collection Time: 03/09/2016  4:06 PM  Result Value Ref Range   Glucose-Capillary 156 (H) 65 - 99 mg/dL  Glucose, capillary     Status: Abnormal   Collection Time: 02/29/2016  4:37 PM  Result Value Ref Range   Glucose-Capillary 160 (H) 65 - 99 mg/dL  Glucose, capillary     Status: Abnormal   Collection Time: 03/21/2016  7:42 PM  Result Value Ref Range   Glucose-Capillary 130 (H) 65 - 99 mg/dL  Magnesium     Status: None   Collection Time: 03/16/2016  7:51 PM  Result Value Ref Range   Magnesium 1.7 1.7 - 2.4 mg/dL  Renal function     Status: Abnormal   Collection Time: 03/03/2016  9:08 PM  Result Value Ref Range   Sodium 137 135 - 145 mmol/L   Potassium 4.3 3.5 - 5.1 mmol/L   Chloride 108 101 - 111 mmol/L   CO2 26 22 - 32 mmol/L   Glucose, Bld 118 (H) 65 - 99 mg/dL   BUN 24 (H) 6 - 20 mg/dL   Creatinine, Ser 0.69 0.61 - 1.24 mg/dL   Calcium 8.3 (L) 8.9 - 10.3 mg/dL   Phosphorus 2.4 (L) 2.5 - 4.6 mg/dL   Albumin 2.0 (L) 3.5 - 5.0 g/dL   GFR calc non Af Amer >60 >60 mL/min   GFR calc Af Amer >60 >60 mL/min   Anion gap 3 (L) 5 - 15  Glucose, capillary     Status: None   Collection Time: 03/04/2016 11:47 PM  Result Value Ref Range   Glucose-Capillary 90 65 - 99 mg/dL  Glucose, capillary     Status: Abnormal   Collection Time: 03/13/16  3:43 AM  Result Value Ref Range   Glucose-Capillary 112 (H) 65 - 99 mg/dL  Renal function  Status: Abnormal   Collection Time: 03/13/16  4:54 AM  Result Value Ref Range   Sodium 139 135 - 145 mmol/L   Potassium 4.6 3.5 - 5.1 mmol/L   Chloride 109 101 - 111 mmol/L   CO2 25 22 - 32 mmol/L   Glucose, Bld 128 (H) 65 - 99 mg/dL   BUN 20 6 - 20 mg/dL   Creatinine, Ser 0.59 (L) 0.61 - 1.24 mg/dL   Calcium 8.3 (L) 8.9 - 10.3 mg/dL   Phosphorus 2.4 (L) 2.5 - 4.6 mg/dL   Albumin 2.0 (L) 3.5 - 5.0 g/dL   GFR calc non Af Amer >60 >60 mL/min   GFR calc Af Amer >60 >60 mL/min   Anion gap 5 5 - 15  Magnesium     Status: None   Collection Time: 03/13/16  4:54 AM  Result Value Ref Range   Magnesium 1.7 1.7 - 2.4 mg/dL  Glucose, capillary     Status: Abnormal   Collection Time: 03/13/16  7:33 AM  Result Value Ref Range   Glucose-Capillary 113 (H) 65 - 99 mg/dL  Blood gas, arterial     Status: None   Collection Time: 03/13/16  8:00 AM  Result Value Ref Range   FIO2 28.00    Delivery systems VENTILATOR    Mode PRESSURE REGULATED VOLUME CONTROL    VT 500 mL   LHR 16 resp/min   Peep/cpap 5.0 cm H20   pH, Arterial 7.41 7.350 - 7.450   pCO2 arterial 38 32.0 - 48.0 mmHg   pO2, Arterial 89 83.0 - 108.0 mmHg   Bicarbonate 24.1 21.0 - 28.0 mEq/L   Acid-base deficit 0.4 0.0 - 2.0 mmol/L   O2  Saturation 96.9 %   Patient temperature 37.0    Collection site LEFT BRACHIAL    Sample type ARTERIAL DRAW    Allens test (pass/fail) PASS PASS  Renal function     Status: Abnormal   Collection Time: 03/13/16  9:42 AM  Result Value Ref Range   Sodium 139 135 - 145 mmol/L   Potassium 4.4 3.5 - 5.1 mmol/L   Chloride 108 101 - 111 mmol/L   CO2 23 22 - 32 mmol/L   Glucose, Bld 134 (H) 65 - 99 mg/dL   BUN 19 6 - 20 mg/dL   Creatinine, Ser 0.66 0.61 - 1.24 mg/dL   Calcium 8.3 (L) 8.9 - 10.3 mg/dL   Phosphorus 2.4 (L) 2.5 - 4.6 mg/dL   Albumin 2.1 (L) 3.5 - 5.0 g/dL   GFR calc non Af Amer >60 >60 mL/min   GFR calc Af Amer >60 >60 mL/min   Anion gap 8 5 - 15  Glucose, capillary     Status: Abnormal   Collection Time: 03/13/16 11:24 AM  Result Value Ref Range   Glucose-Capillary 129 (H) 65 - 99 mg/dL    SUBJECTIVE:  No acute events.  On vent and tolerating trach well.  OBJECTIVE:  GEN- sedated  NECK- dressing removed, minimal secretions and minimal bleeding.  New dressing placed along inferior aspect of tracheostomy  IMPRESSION:  S/p trach POD#1  PLAN:  Continue routine trach care.  Remove sutures POD#5.  Change dressing as needed.  Please reconsult for any concerns.  Coretha Creswell 03/13/2016, 12:22 PM

## 2016-03-13 NOTE — Progress Notes (Signed)
PULMONARY / CRITICAL CARE MEDICINE   Name: John Schultz MRN: [id number] DOB: 12-Dec-1940    ADMISSION DATE:  03/23/2016  BRIEF HISTORY: 75 year old male past medical history of hypertension, chronic diastolic heart failure, CK, diabetes, presented on April 2 with four-day history of progressive right knee pain fever, shortness of breath, cough. Within 24 hours decompensated requiring intubation on 04/03.  Seen by orthopedics, had drain placed in right knee with purulent material. Now with mechanical ventilation and unable to weaned off after failed extubation 04/13 2/2  stridor and poor cough.   SUBJECTIVE:  No acute issues overnight.On precedex.  Remains on full vent support and CRRT. S/p trach yesterday  SIGNIFICANT EVENTS/STUDIES: 04/02 admitted by Hospitalist Service with 4 day history of right knee pain - swollen, erythematous and tender on exam - and severe sepsis, AKI, abnormal EKG. Cardiac markers positive (peak troponin I 8.10) 04/03 Orthopedics consultation and aspiration of R Knee, with drain placement 04/03 ID consultation 04/03 Nephrology consultation 04/04 TTE: LVEF 60-65%, no vegetations 04/04 altered mental status, worsening respiratory status, septic, intubated 04/04 R knee hemovac removed by ortho 04/06 Cardiology consultation: NSTEMI - deemed secondary to demand ischemia. Further W/U to be considered after resolution of severe sepsis and critical illness 04/09 CRRT initiated  04/10 agitated on WUA. Failed SBT. Off and on norepienphrine 04/11 Tolerating volume removal by CRRT. Failed SBT. Tolerated PS 10-14 cm H2O 04/12 Failed SBT. Agitated on WUA. Uo improving. Started back on fentanyl gtt in PM 04/13 LE venous US:  04/13 Passed SBT. Cognition remains marginal. Extubated. Post extubation stridor and poor cough. BiPAP initiated. High risk of re-intubation 04/13 Re-intubated in afternoon. ENT consult for trach tube placement - planned 04/17.  04/13 CT head: diffuse  atrophy, no acute disease 04/14 CRRT resumed due to hypervolemia 4/17 trach by ENT  INDWELLING DEVICES:: R IJ CVL 04/03 >>  ETT 04/03 >> 04/13, 04/13 >>  R femoral HD cath 04/09 >>   MICRO DATA: 04/02 MRSA PCR >> NEG 04/02 flu screen >> NEG 04/02 blood >> Strep pyogenes 04/02 knee aspirate >> Strep pyogenes 04/05 blood >> NEG 04/11 C diff >> NEG 04/12 Resp >> NOF 04/13 Resp >>   PCT algorithm 04/03: 22.50 > 16.65 > 7.12 PCT algorithm 04/12: 0.62 > 0.57 > 2.66  ANTIMICROBIALS:  Ceftriaxone 04/02 X 1  Vanc 04/02 >> 04/03 Pip-tazo 04/02 >> 04/03 Clinda 04/03 >> 04/07 Pen G 04/03 >>    VITAL SIGNS: Temp:  [94.7 F (34.8 C)-98.2 F (36.8 C)] 97.7 F (36.5 C) (04/18 0800) Pulse Rate:  [55-88] 70 (04/18 0800) Resp:  [16-30] 29 (04/18 0800) BP: (94-150)/(48-77) 112/56 mmHg (04/18 0800) SpO2:  [95 %-100 %] 100 % (04/18 0800) FiO2 (%):  [28 %] 28 % (04/18 0800) Weight:  [218 lb 11.1 oz (99.2 kg)] 218 lb 11.1 oz (99.2 kg) (04/18 0500) HEMODYNAMICS: CVP:  [3 mmHg-14 mmHg] 8 mmHg VENTILATOR SETTINGS: Vent Mode:  [-] PRVC FiO2 (%):  [28 %] 28 % Set Rate:  [16 bmp] 16 bmp Vt Set:  [500 mL] 500 mL PEEP:  [5 cmH20] 5 cmH20 INTAKE / OUTPUT:  Intake/Output Summary (Last 24 hours) at 03/13/16 0925 Last data filed at 03/13/16 0800  Gross per 24 hour  Intake 983.95 ml  Output   3939 ml  Net -2955.05 ml    Review of Systems  Unable to perform ROS: intubated    Physical Exam  Constitutional:  RASS -3, not F/C  HENT:  Head: Normocephalic and  atraumatic.  Eyes: EOM are normal. Pupils are equal, round, and reactive to light.  Cardiovascular: Regular rhythm and normal heart sounds.   No murmur heard. Pulmonary/Chest: No respiratory distress. He has no wheezes. He has no rales.  Abdominal: Soft. Bowel sounds are normal. He exhibits no distension. There is no tenderness.  Musculoskeletal:  R>L LE edema  Neurological: He has normal reflexes.  Moves all extremities      LABS:  CBC  Recent Labs Lab 03/11/16 0348 03/11/16 2155 03/10/2016 0549  WBC 8.2 6.3 7.8  HGB 7.3* 6.6* 9.3*  HCT 22.0* 20.4* 27.3*  PLT 73* 66* 69*   Coag's No results for input(s): APTT, INR in the last 168 hours. BMET  Recent Labs Lab 03/10/2016 1512 03/08/2016 2108 03/13/16 0454  NA 137 137 139  K 4.7 4.3 4.6  CL 108 108 109  CO2 25 26 25   BUN 30* 24* 20  CREATININE 0.70 0.69 0.59*  GLUCOSE 172* 118* 128*   Electrolytes  Recent Labs Lab 03/19/2016 0549  03/24/2016 1512 03/11/2016 1951 03/19/2016 2108 03/13/16 0454  CALCIUM 8.5*  < > 8.4*  --  8.3* 8.3*  MG 1.9  --   --  1.7  --  1.7  PHOS  --   < > 3.1  --  2.4* 2.4*  < > = values in this interval not displayed. Sepsis Markers  Recent Labs Lab 03/08/16 0630 03/09/16 0419 03/10/16 0426  PROCALCITON 0.57 2.66 2.35   ABG  Recent Labs Lab 03/11/16 0457 03/04/2016 0500 03/13/16 0800  PHART 7.50* 7.50* 7.41  PCO2ART 36 35 38  PO2ART 104 129* 89   Liver Enzymes  Recent Labs Lab 03/10/16 0426  03/25/2016 1512 02/25/2016 2108 03/13/16 0454  AST 39  --   --   --   --   ALT 21  --   --   --   --   ALKPHOS 93  --   --   --   --   BILITOT 0.6  --   --   --   --   ALBUMIN 1.4*  < > 2.0* 2.0* 2.0*  < > = values in this interval not displayed. Cardiac Enzymes  Recent Labs Lab 03/09/16 0419  TROPONINI 1.05*   Glucose  Recent Labs Lab 03/19/2016 1606 02/28/2016 1637 03/23/2016 1942 02/25/2016 2347 03/13/16 0343 03/13/16 0733  GLUCAP 156* 160* 130* 90 112* 113*    CXR: images from 4/16  Continued bilateral airspace opacities with persistent perihilar and lower lobe airspace disease   DISCUSSION: 75 year old male with septic right knee, developed sepsis/renal failure requiring CRRT, also requiring intubation, now status post tracheostomy on 03/17/2016. Delirium/agitation, and poor urine output, along with inability to completely wean off ventilator are limiting factors to his overall clinical  improvement.  ASSESSMENT / PLAN:  PULMONARY Acute vent dep respiratory failure Pulm edema pattern on CXR Failed extubation 04/13 S/P trach 4/17 by ENT P: Cont vent support - settings reviewed and/or adjusted Cont vent bundle Daily SBT and sedation vacation;  S/p trach 4/17  CARDIOVASCULAR Septic shock, resolved NSTEMI - Cardiology has seen PAF P:  MAP goal > 60 mmHg PRN phenylephrine Cont ASA and statin Further cardiac w/u after critical illness resolves per cardiology  RENAL AKI Severe hypervolemia P: Monitor BMET intermittently Monitor I/Os Correct electrolytes as indicated Renal Service following CRRT  resumed 04/14; continue per nephrology  GASTROINTESTINAL Diarrhea  Hemoccult positive  FEN P: SUP: enteral famotidine Cont TFs Monitor for bleeding  Assess for PEG placement, will d/w with wife today.  HEMATOLOGIC Anemia without overt bleeding Thrombocytopenia  P: DVT px: SCDs (SQ heparin DC'd 04/12) Monitor CBC intermittently Transfuse per usual guidelines  INFECTIOUS Septic arthritis of the R knee Purulent ET secretions - much improved, now with Trach Small increase in PCT 04/14 - unclear significance P: Monitor temp, WBC count Micro and abx as above ID service following - discussed with them, still with intermitted use of pressors and still tender and warn, will discuss with Ortho about re-aspirating the Right knee Remove RIJ CVL and place PICC  ENDOCRINE DM 2, controlled Hyperglycemia improved P: Cont Lantus Cont SSI  NEUROLOGIC ICU acquired delirium - hypoactive Profound deconditioning P: RASS goal: 0, -1 Precedex initiated 04/14; continue to titrate to RASS goal Monitor for bradycardia PRN fentanyl Willl need extensive rehab if he survives this acute phase of illness  Family - wife at bedside, updated on clinical status, she is in agreement with PICC and PEG placement.   I have personally obtained a history, examined the  patient, evaluated Pertinent laboratory and [employer]/imaging results, and  formulated the assessment and plan   The Patient requires high complexity decision making for assessment and support, frequent evaluation and titration of therapies, application of advanced monitoring technologies and extensive interpretation of multiple databases. Critical Care Time devoted to patient care services described in this note is 40 minutes.   Overall, patient is critically ill, prognosis is guarded.  Patient with Multiorgan failure and at high risk for cardiac arrest and death.   Vilinda Boehringer, MD [facility] Pager [phone] (please enter 7-digits) On Call Pager - [phone] (please enter 7-digits)

## 2016-03-13 NOTE — Progress Notes (Signed)
Nutrition Follow-up  DOCUMENTATION CODES:   Not applicable  INTERVENTION:   -Pt currently without access for EN as NG tube unable to placed, no OG tube in place at this time. Consult placed for PEG placement evaluation.  Will discontinue current TF order and will make further recommendations following poc.   NUTRITION DIAGNOSIS:   Inadequate oral intake related to acute illness as evidenced by NPO status.  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, respiratory distress with severe acidosis requiring intubation on 02/27/16  Pt s/p successful trach placement yesterday. Pt CRRT restarted.  Per Nsg note, OG tube removed, NG tube unable to be placed. GI consulted for PEG tube placement.   Diet Order:   currently NPO. TF held at midnight yesterday for trach placement procedure.    Gastrointestinal Profile: Last BM:  03/13/2016 diarrhea, rectal tube in place  Medications: SS novolog, Precedex Labs: reviewed, P remains 2.4   Weight Trend since Admission: Filed Weights   03/11/16 0345 02/28/2016 0500 03/13/16 0500  Weight: 228 lb 13.4 oz (103.8 kg) 231 lb 4.2 oz (104.9 kg) 218 lb 11.1 oz (99.2 kg)     Skin:   (Stage I buttock, Stage II sacral pressure ulcers)   BMI:  Body mass index is 29.65 kg/(m^2).  Estimated Nutritional Needs:   Kcal:  1903 kcals (Ve: 14.2, Tmax: 36.8)   Protein:  128-170 g (1.5-2.0 g/kg)   Fluid:  >1.9 L  EDUCATION NEEDS:   Education needs no appropriate at this time  Dwyane Luo, RD, LDN Pager [phone] Weekend/On-Call Pager [phone]

## 2016-03-13 NOTE — Progress Notes (Signed)
Dr. Stevenson Clinch cleared newly placed right upper arm PICC.

## 2016-03-13 NOTE — Anesthesia Postprocedure Evaluation (Addendum)
Anesthesia Post Note  Patient: John Schultz  Procedure(s) Performed: Procedure(s) (LRB): TRACHEOSTOMY (N/A)  Patient location during evaluation: ICU Anesthesia Type: General Level of consciousness: sedated and lethargic Pain management: pain level controlled Vital Signs Assessment: post-procedure vital signs reviewed and stable Respiratory status: patient on ventilator - see flowsheet for VS Cardiovascular status: blood pressure returned to baseline Postop Assessment: no headache Anesthetic complications: no    Last Vitals:  Filed Vitals:   03/13/16 0500 03/13/16 0600  BP: 96/57 94/48  Pulse: 70 65  Temp:  35.9 C  Resp: 25 20    Last Pain:  Filed Vitals:   03/13/16 0617  PainSc: 0-No pain                 Buckner [place]

## 2016-03-13 NOTE — Progress Notes (Signed)
Right knee aspirated, about 5 cc withdrawn and sent to lab for analysis.

## 2016-03-13 NOTE — Progress Notes (Signed)
[facility] NOTE  Pharmacy Consult for CRRT Medication Review   Allergies  Allergen Reactions  . Tape Rash    Patient Measurements: Height: 6' (182.9 cm) Weight: 218 lb 11.1 oz (99.2 kg) IBW/kg (Calculated) : 77.6  Vital Signs: Temp: 97.7 F (36.5 C) (04/18 0800) Temp Source: Oral (04/18 0800) BP: 84/43 mmHg (04/18 1000) Pulse Rate: 57 (04/18 1000) Intake/Output from previous day: 04/17 0701 - 04/18 0700 In: 941.6 [I.V.:541.6; IV Piggyback:400] Out: 4081 [Urine:955; Stool:250; Blood:3] Intake/Output from this shift: Total I/O In: 63.6 [I.V.:63.6] Out: 436 [Other:436] Vent settings for last 24 hours: Vent Mode:  [-] PRVC FiO2 (%):  [28 %] 28 % Set Rate:  [16 bmp] 16 bmp Vt Set:  [500 mL] 500 mL PEEP:  [5 cmH20] 5 cmH20  Labs:  Recent Labs  03/11/16 0348  03/11/16 2155  03/05/2016 0549  03/15/2016 1951 03/15/2016 2108 03/13/16 0454 03/13/16 0942  WBC 8.2  --  6.3  --  7.8  --   --   --   --   --   HGB 7.3*  --  6.6*  --  9.3*  --   --   --   --   --   HCT 22.0*  --  20.4*  --  27.3*  --   --   --   --   --   PLT 73*  --  66*  --  69*  --   --   --   --   --   CREATININE 0.68  < > 0.79  < > 0.69  < >  --  0.69 0.59* 0.66  MG 1.7  < >  --   --  1.9  --  1.7  --  1.7  --   PHOS 3.1  < > 2.4*  < >  --   < >  --  2.4* 2.4* 2.4*  ALBUMIN 1.8*  < > 1.8*  < >  --   < >  --  2.0* 2.0* 2.1*  < > = values in this interval not displayed. Estimated Creatinine Clearance: 100.3 mL/min (by C-G formula based on Cr of 0.66).   Recent Labs  03/03/2016 2347 03/13/16 0343 03/13/16 0733  GLUCAP 90 112* 113*    Microbiology: Recent Results (from the past 720 hour(s))  Blood culture (routine x 2)     Status: Abnormal   Collection Time: 03/04/2016 12:59 PM  Result Value Ref Range Status   Specimen Description BLOOD LEFT FOREARM  Final   Special Requests BOTTLES DRAWN AEROBIC AND ANAEROBIC  1CC  Final   Culture  Setup Time   Final    GRAM POSITIVE  COCCI AEROBIC BOTTLE ONLY CRITICAL VALUE NOTED.  VALUE IS CONSISTENT WITH PREVIOUSLY REPORTED AND CALLED VALUE.    Culture STREPTOCOCCUS PYOGENES AEROBIC BOTTLE ONLY  (A)  Final   Report Status 03/03/2016 FINAL  Final   Organism ID, Bacteria STREPTOCOCCUS PYOGENES  Final      Susceptibility   Streptococcus pyogenes - MIC*    CLINDAMYCIN Value in next row Sensitive      SENSITIVE0.25    AMPICILLIN Value in next row Sensitive      SENSITIVE0.25    ERYTHROMYCIN Value in next row Sensitive      SENSITIVE0.12    VANCOMYCIN Value in next row Sensitive      SENSITIVE0.5    CEFTRIAXONE Value in next row Sensitive      SENSITIVE0.12  LEVOFLOXACIN Value in next row Sensitive      SENSITIVE0.5    * STREPTOCOCCUS PYOGENES  Rapid Influenza A&B Antigens ([facility] only)     Status: None   Collection Time: 03/22/2016  1:00 PM  Result Value Ref Range Status   Influenza A ([place]) NEGATIVE NEGATIVE Final   Influenza B ([facility]) NEGATIVE NEGATIVE Final  Blood culture (routine x 2)     Status: None   Collection Time: 02/27/2016  1:40 PM  Result Value Ref Range Status   Specimen Description BLOOD LEFT FOREARM  Final   Special Requests BOTTLES DRAWN AEROBIC AND ANAEROBIC  4CC  Final   Culture  Setup Time   Final    GRAM POSITIVE COCCI IN BOTH AEROBIC AND ANAEROBIC BOTTLES CRITICAL RESULT CALLED TO, READ BACK BY AND VERIFIED WITH: [place] AT 6222 02/27/16 CAF    Culture   Final    STREPTOCOCCUS PYOGENES IN BOTH AEROBIC AND ANAEROBIC BOTTLES    Report Status 02/29/2016 FINAL  Final   Organism ID, Bacteria STREPTOCOCCUS PYOGENES  Final      Susceptibility   Streptococcus pyogenes - MIC*    CLINDAMYCIN Value in next row Sensitive      SENSITIVE<=0.25    AMPICILLIN Value in next row Sensitive      SENSITIVE<=0.25    ERYTHROMYCIN Value in next row Sensitive      SENSITIVE0.12    VANCOMYCIN Value in next row Sensitive      SENSITIVE0.5    CEFTRIAXONE Value in next row Sensitive      SENSITIVE0.12     LEVOFLOXACIN Value in next row Sensitive      SENSITIVE0.5    * STREPTOCOCCUS PYOGENES  Blood Culture ID Panel (Reflexed)     Status: Abnormal   Collection Time: 03/19/2016  1:40 PM  Result Value Ref Range Status   Enterococcus species NOT DETECTED NOT DETECTED Final   Vancomycin resistance NOT DETECTED NOT DETECTED Final   Listeria monocytogenes NOT DETECTED NOT DETECTED Final   Staphylococcus species NOT DETECTED NOT DETECTED Final   Staphylococcus aureus NOT DETECTED NOT DETECTED Final   Methicillin resistance NOT DETECTED NOT DETECTED Final   Streptococcus species NOT DETECTED NOT DETECTED Final   Streptococcus agalactiae NOT DETECTED NOT DETECTED Final   Streptococcus pneumoniae NOT DETECTED NOT DETECTED Final   Streptococcus pyogenes DETECTED (A) NOT DETECTED Final    Comment: CRITICAL RESULT CALLED TO, READ BACK BY AND VERIFIED WITH: NATE COOKSON AT 0426 02/27/16 CAF    Acinetobacter baumannii NOT DETECTED NOT DETECTED Final   Enterobacteriaceae species NOT DETECTED NOT DETECTED Final   Enterobacter cloacae complex NOT DETECTED NOT DETECTED Final   Escherichia coli NOT DETECTED NOT DETECTED Final   Klebsiella oxytoca NOT DETECTED NOT DETECTED Final   Klebsiella pneumoniae NOT DETECTED NOT DETECTED Final   Proteus species NOT DETECTED NOT DETECTED Final   Serratia marcescens NOT DETECTED NOT DETECTED Final   Carbapenem resistance NOT DETECTED NOT DETECTED Final   Haemophilus influenzae NOT DETECTED NOT DETECTED Final   Neisseria meningitidis NOT DETECTED NOT DETECTED Final   Pseudomonas aeruginosa NOT DETECTED NOT DETECTED Final   Candida albicans NOT DETECTED NOT DETECTED Final   Candida glabrata NOT DETECTED NOT DETECTED Final   Candida krusei NOT DETECTED NOT DETECTED Final   Candida parapsilosis NOT DETECTED NOT DETECTED Final   Candida tropicalis NOT DETECTED NOT DETECTED Final  Body fluid culture     Status: None   Collection Time: 02/29/2016  3:05 PM  Result Value Ref  Range Status   Specimen Description R Knee  Final   Special Requests NONE  Final   Gram Stain   Final    MODERATE WBC SEEN MODERATE GRAM POSITIVE COCCI IN PAIRS AND CHAINS    Culture   Final    HEAVY GROWTH GROUP A STREP (S.PYOGENES) ISOLATED There is no known Penicillin Resistant Beta Streptococcus in the [place] For patients that are Penicillin-allergic, Erythromycin is 85-94% susceptible, and Clindamycin is 80% susceptible.  Contact Microbiology within 7 days if sensitivity testing is  required.   NO ANAEROBES ISOLATED    Report Status 02/29/2016 FINAL  Final  Body fluid culture     Status: None   Collection Time: 03/08/2016  4:10 PM  Result Value Ref Range Status   Specimen Description R Knee  Final   Special Requests NONE  Final   Gram Stain   Final    MANY WBC SEEN MANY GRAM POSITIVE COCCI IN PAIRS AND CHAINS    Culture   Final    HEAVY GROWTH GROUP A STREP (S.PYOGENES) ISOLATED There is no known Penicillin Resistant Beta Streptococcus in the [place] For patients that are Penicillin-allergic, Erythromycin is 85-94% susceptible, and Clindamycin is 80% susceptible.  Contact Microbiology within 7 days if sensitivity testing is  required.   NO ANAEROBES ISOLATED    Report Status 02/29/2016 FINAL  Final  MRSA PCR Screening     Status: None   Collection Time: 02/27/16  3:35 AM  Result Value Ref Range Status   MRSA by PCR NEGATIVE NEGATIVE Final    Comment:        The GeneXpert MRSA Assay (FDA approved for NASAL specimens only), is one component of a comprehensive MRSA colonization surveillance program. It is not intended to diagnose MRSA infection nor to guide or monitor treatment for MRSA infections.   Urine culture     Status: None   Collection Time: 02/27/16  8:30 AM  Result Value Ref Range Status   Specimen Description URINE, RANDOM  Final   Special Requests NONE  Final   Culture NO GROWTH 1 DAY  Final   Report Status 02/28/2016 FINAL  Final  Culture, blood (single) w  Reflex to PCR ID Panel     Status: None   Collection Time: 02/29/16  2:36 PM  Result Value Ref Range Status   Specimen Description BLOOD LEFT HAND  Final   Special Requests BOTTLES DRAWN AEROBIC AND ANAEROBIC [place]  Final   Culture NO GROWTH 5 DAYS  Final   Report Status 03/05/2016 FINAL  Final  C difficile quick scan w PCR reflex     Status: None   Collection Time: 03/06/16  5:37 PM  Result Value Ref Range Status   C Diff antigen NEGATIVE NEGATIVE Final   C Diff toxin NEGATIVE NEGATIVE Final   C Diff interpretation Negative for C. difficile  Final  Culture, respiratory (NON-Expectorated)     Status: None   Collection Time: 03/07/16  8:20 AM  Result Value Ref Range Status   Specimen Description TRACHEAL ASPIRATE  Final   Special Requests NONE  Final   Gram Stain   Final    MODERATE WBC SEEN FEW SQUAMOUS EPITHELIAL CELLS PRESENT RARE YEAST    Culture Consistent with normal respiratory flora.  Final   Report Status 03/09/2016 FINAL  Final  Culture, respiratory (NON-Expectorated)     Status: None   Collection Time: 03/08/16  4:52 PM  Result Value Ref Range Status  Specimen Description TRACHEAL ASPIRATE  Final   Special Requests NONE  Final   Gram Stain NO ORGANISMS SEEN  Final   Culture Consistent with normal respiratory flora.  Final   Report Status 03/10/2016 FINAL  Final    Medications:  Scheduled:  . antiseptic oral rinse  7 mL Mouth Rinse QID  . aspirin  325 mg Per Tube Daily  . budesonide (PULMICORT) nebulizer solution  0.25 mg Nebulization BID  . chlorhexidine gluconate (SAGE KIT)  15 mL Mouth Rinse BID  . famotidine  20 mg Per Tube Daily  . feeding supplement (PRO-STAT SUGAR FREE 64)  30 mL Oral QID  .  HYDROmorphone (DILAUDID) injection  1 mg Intravenous BID  . insulin aspart  0-15 Units Subcutaneous 6 times per day  . ipratropium-albuterol  3 mL Nebulization Q6H  . pencillin G potassium IV  3 Million Units Intravenous 4 times per day  . pravastatin  40  mg Per Tube q1800   Infusions:  . alteplase    . dexmedetomidine 0.4 mcg/kg/hr (03/13/16 1041)  . feeding supplement (VITAL AF 1.2 CAL) Stopped (03/11/2016 0005)  . phenylephrine (NEO-SYNEPHRINE) Adult infusion 10 mcg/min (03/13/16 1041)  . pureflow 3 each (03/23/2016 2134)    Assessment: 75 y/o M admitted with septic shock now requiring CRRT. Pharmacy consulted to adjust medication dosing for CRRT.   Plan:  No medications require adjustment at present.   Pharmacy will continue to monitor and adjust per consult.   Napoleon Form, RPh Clinical Pharmacist 03/13/2016,11:21 AM

## 2016-03-14 ENCOUNTER — Encounter: Payer: Self-pay | Admitting: Anesthesiology

## 2016-03-14 ENCOUNTER — Inpatient Hospital Stay: Payer: Commercial Managed Care - HMO

## 2016-03-14 ENCOUNTER — Encounter: Payer: Self-pay | Admitting: Gastroenterology

## 2016-03-14 ENCOUNTER — Encounter: Admission: EM | Disposition: E | Payer: Self-pay | Source: Home / Self Care | Attending: Internal Medicine

## 2016-03-14 HISTORY — PX: PEG PLACEMENT: SHX5437

## 2016-03-14 LAB — RENAL FUNCTION PANEL
ALBUMIN: 2 g/dL — AB (ref 3.5–5.0)
ALBUMIN: 2 g/dL — AB (ref 3.5–5.0)
ANION GAP: 4 — AB (ref 5–15)
Albumin: 2 g/dL — ABNORMAL LOW (ref 3.5–5.0)
Anion gap: 5 (ref 5–15)
Anion gap: 10 (ref 5–15)
BUN: 13 mg/dL (ref 6–20)
BUN: 12 mg/dL (ref 6–20)
BUN: 13 mg/dL (ref 6–20)
CALCIUM: 8.3 mg/dL — AB (ref 8.9–10.3)
CHLORIDE: 106 mmol/L (ref 101–111)
CO2: 27 mmol/L (ref 22–32)
CO2: 27 mmol/L (ref 22–32)
CO2: 24 mmol/L (ref 22–32)
CREATININE: 0.63 mg/dL (ref 0.61–1.24)
CREATININE: 0.84 mg/dL (ref 0.61–1.24)
Calcium: 8.1 mg/dL — ABNORMAL LOW (ref 8.9–10.3)
Calcium: 8.2 mg/dL — ABNORMAL LOW (ref 8.9–10.3)
Chloride: 106 mmol/L (ref 101–111)
Chloride: 107 mmol/L (ref 101–111)
Creatinine, Ser: 0.64 mg/dL (ref 0.61–1.24)
GFR calc Af Amer: >60 mL/min (ref >60)
GFR calc Af Amer: >60 mL/min (ref >60)
GFR calc non Af Amer: >60 mL/min (ref >60)
GFR calc non Af Amer: >60 mL/min (ref >60)
GLUCOSE: 173 mg/dL — AB (ref 65–99)
Glucose, Bld: 165 mg/dL — ABNORMAL HIGH (ref 65–99)
Glucose, Bld: 148 mg/dL — ABNORMAL HIGH (ref 65–99)
PHOSPHORUS: 2.2 mg/dL — AB (ref 2.5–4.6)
PHOSPHORUS: 1.8 mg/dL — AB (ref 2.5–4.6)
POTASSIUM: 4.5 mmol/L (ref 3.5–5.1)
Phosphorus: 3.6 mg/dL (ref 2.5–4.6)
Potassium: 4.1 mmol/L (ref 3.5–5.1)
Potassium: 4.5 mmol/L (ref 3.5–5.1)
SODIUM: 139 mmol/L (ref 135–145)
Sodium: 137 mmol/L (ref 135–145)
Sodium: 140 mmol/L (ref 135–145)

## 2016-03-14 LAB — CBC
HCT: 24.8 % — ABNORMAL LOW (ref 40.0–52.0)
HEMOGLOBIN: 8.2 g/dL — AB (ref 13.0–18.0)
MCH: 30.4 pg (ref 26.0–34.0)
MCHC: 33.2 g/dL (ref 32.0–36.0)
MCV: 91.5 fL (ref 80.0–100.0)
Platelets: 71 10*3/uL — ABNORMAL LOW (ref 150–440)
RBC: 2.72 MIL/uL — AB (ref 4.40–5.90)
RDW: 14.4 % (ref 11.5–14.5)
WBC: 4.9 10*3/uL (ref 3.8–10.6)

## 2016-03-14 LAB — GLUCOSE, CAPILLARY
GLUCOSE-CAPILLARY: 112 mg/dL — AB (ref 65–99)
GLUCOSE-CAPILLARY: 108 mg/dL — AB (ref 65–99)
GLUCOSE-CAPILLARY: 136 mg/dL — AB (ref 65–99)
GLUCOSE-CAPILLARY: 101 mg/dL — AB (ref 65–99)
Glucose-Capillary: 130 mg/dL — ABNORMAL HIGH (ref 65–99)

## 2016-03-14 LAB — MAGNESIUM
Magnesium: 1.7 mg/dL (ref 1.7–2.4)
Magnesium: 1.7 mg/dL (ref 1.7–2.4)

## 2016-03-14 SURGERY — INSERTION, PEG TUBE
Anesthesia: General

## 2016-03-14 MED ORDER — MIDAZOLAM HCL 2 MG/2ML IJ SOLN
2.0000 mg | Freq: Once | INTRAMUSCULAR | Status: AC
  Administered 2016-03-14: 2 mg via INTRAVENOUS

## 2016-03-14 MED ORDER — CEFAZOLIN SODIUM 1-5 GM-% IV SOLN
1.0000 g | Freq: Once | INTRAVENOUS | Status: AC
  Administered 2016-03-14: 1 g via INTRAVENOUS
  Filled 2016-03-14: qty 50

## 2016-03-14 MED ORDER — FENTANYL CITRATE (PF) 100 MCG/2ML IJ SOLN
50.0000 ug | Freq: Once | INTRAMUSCULAR | Status: AC
  Administered 2016-03-14: 50 ug via INTRAVENOUS

## 2016-03-14 MED ORDER — SODIUM PHOSPHATE 3 MMOLE/ML IV SOLN
20.0000 mmol | Freq: Once | INTRAVENOUS | Status: AC
  Administered 2016-03-14 (×2): 20 mmol via INTRAVENOUS
  Filled 2016-03-14: qty 6.67

## 2016-03-14 MED ORDER — MIDAZOLAM HCL 2 MG/2ML IJ SOLN
INTRAMUSCULAR | Status: AC
  Administered 2016-03-14: 2 mg via INTRAVENOUS
  Filled 2016-03-14: qty 4

## 2016-03-14 MED ORDER — FENTANYL CITRATE (PF) 100 MCG/2ML IJ SOLN
INTRAMUSCULAR | Status: AC
  Administered 2016-03-14: 50 ug via INTRAVENOUS
  Filled 2016-03-14: qty 2

## 2016-03-14 NOTE — Progress Notes (Signed)
[place] NOTE  Pharmacy Consult for CRRT Medication Review   Allergies  Allergen Reactions  . Tape Rash    Patient Measurements: Height: 6' (182.9 cm) Weight: 220 lb 0.3 oz (99.8 kg) IBW/kg (Calculated) : 77.6  Vital Signs: Temp: 98 F (36.7 C) (04/19 0700) Temp Source: Axillary (04/19 0700) BP: 103/57 mmHg (04/19 1000) Pulse Rate: 71 (04/19 1000) Intake/Output from previous day: 04/18 0701 - 04/19 0700 In: 593.3 [I.V.:293.3; IV Piggyback:300] Out: 1761 [Urine:175] Intake/Output from this shift: Total I/O In: -  Out: 293 [Other:293] Vent settings for last 24 hours: Vent Mode:  [-] PRVC FiO2 (%):  [28 %] 28 % Set Rate:  [16 bmp] 16 bmp Vt Set:  [500 mL] 500 mL PEEP:  [5 cmH20] 5 cmH20 Plateau Pressure:  [19 cmH20] 19 cmH20  Labs:  Recent Labs  03/11/16 2155  03/21/2016 0549  03/13/16 0454  03/13/16 2136 03/13/2016 0424 03/02/2016 0958 03/11/2016 1020  WBC 6.3  --  7.8  --   --   --   --   --   --  4.9  HGB 6.6*  --  9.3*  --   --   --   --   --   --  8.2*  HCT 20.4*  --  27.3*  --   --   --   --   --   --  24.8*  PLT 66*  --  69*  --   --   --   --   --   --  71*  CREATININE 0.79  < > 0.69  < > 0.59*  < > 0.63 0.64 0.63  --   MG  --   --  1.9  < > 1.7  --  1.6* 1.7  --   --   PHOS 2.4*  < >  --   < > 2.4*  < > 2.3* 2.2* 1.8*  --   ALBUMIN 1.8*  < >  --   < > 2.0*  < > 2.1* 2.0* 2.0*  --   < > = values in this interval not displayed. Estimated Creatinine Clearance: 100.6 mL/min (by C-G formula based on Cr of 0.63).   Recent Labs  03/01/2016 0438 02/29/2016 0722 03/10/2016 1116  GLUCAP 112* 130* 108*    Microbiology: Recent Results (from the past 720 hour(s))  Blood culture (routine x 2)     Status: Abnormal   Collection Time: 02/25/2016 12:59 PM  Result Value Ref Range Status   Specimen Description BLOOD LEFT FOREARM  Final   Special Requests BOTTLES DRAWN AEROBIC AND ANAEROBIC  1CC  Final   Culture  Setup Time   Final    GRAM POSITIVE  COCCI AEROBIC BOTTLE ONLY CRITICAL VALUE NOTED.  VALUE IS CONSISTENT WITH PREVIOUSLY REPORTED AND CALLED VALUE.    Culture STREPTOCOCCUS PYOGENES AEROBIC BOTTLE ONLY  (A)  Final   Report Status 03/03/2016 FINAL  Final   Organism ID, Bacteria STREPTOCOCCUS PYOGENES  Final      Susceptibility   Streptococcus pyogenes - MIC*    CLINDAMYCIN Value in next row Sensitive      SENSITIVE0.25    AMPICILLIN Value in next row Sensitive      SENSITIVE0.25    ERYTHROMYCIN Value in next row Sensitive      SENSITIVE0.12    VANCOMYCIN Value in next row Sensitive      SENSITIVE0.5    CEFTRIAXONE Value in next row Sensitive  SENSITIVE0.12    LEVOFLOXACIN Value in next row Sensitive      SENSITIVE0.5    * STREPTOCOCCUS PYOGENES  Rapid Influenza A&B Antigens ([facility] only)     Status: None   Collection Time: 03/01/2016  1:00 PM  Result Value Ref Range Status   Influenza A ([place]) NEGATIVE NEGATIVE Final   Influenza B ([facility]) NEGATIVE NEGATIVE Final  Blood culture (routine x 2)     Status: None   Collection Time: 03/10/2016  1:40 PM  Result Value Ref Range Status   Specimen Description BLOOD LEFT FOREARM  Final   Special Requests BOTTLES DRAWN AEROBIC AND ANAEROBIC  4CC  Final   Culture  Setup Time   Final    GRAM POSITIVE COCCI IN BOTH AEROBIC AND ANAEROBIC BOTTLES CRITICAL RESULT CALLED TO, READ BACK BY AND VERIFIED WITH: Iron AT 3016 02/27/16 CAF    Culture   Final    STREPTOCOCCUS PYOGENES IN BOTH AEROBIC AND ANAEROBIC BOTTLES    Report Status 02/29/2016 FINAL  Final   Organism ID, Bacteria STREPTOCOCCUS PYOGENES  Final      Susceptibility   Streptococcus pyogenes - MIC*    CLINDAMYCIN Value in next row Sensitive      SENSITIVE<=0.25    AMPICILLIN Value in next row Sensitive      SENSITIVE<=0.25    ERYTHROMYCIN Value in next row Sensitive      SENSITIVE0.12    VANCOMYCIN Value in next row Sensitive      SENSITIVE0.5    CEFTRIAXONE Value in next row Sensitive      SENSITIVE0.12     LEVOFLOXACIN Value in next row Sensitive      SENSITIVE0.5    * STREPTOCOCCUS PYOGENES  Blood Culture ID Panel (Reflexed)     Status: Abnormal   Collection Time: 03/17/2016  1:40 PM  Result Value Ref Range Status   Enterococcus species NOT DETECTED NOT DETECTED Final   Vancomycin resistance NOT DETECTED NOT DETECTED Final   Listeria monocytogenes NOT DETECTED NOT DETECTED Final   Staphylococcus species NOT DETECTED NOT DETECTED Final   Staphylococcus aureus NOT DETECTED NOT DETECTED Final   Methicillin resistance NOT DETECTED NOT DETECTED Final   Streptococcus species NOT DETECTED NOT DETECTED Final   Streptococcus agalactiae NOT DETECTED NOT DETECTED Final   Streptococcus pneumoniae NOT DETECTED NOT DETECTED Final   Streptococcus pyogenes DETECTED (A) NOT DETECTED Final    Comment: CRITICAL RESULT CALLED TO, READ BACK BY AND VERIFIED WITH: NATE COOKSON AT 0426 02/27/16 CAF    Acinetobacter baumannii NOT DETECTED NOT DETECTED Final   Enterobacteriaceae species NOT DETECTED NOT DETECTED Final   Enterobacter cloacae complex NOT DETECTED NOT DETECTED Final   Escherichia coli NOT DETECTED NOT DETECTED Final   Klebsiella oxytoca NOT DETECTED NOT DETECTED Final   Klebsiella pneumoniae NOT DETECTED NOT DETECTED Final   Proteus species NOT DETECTED NOT DETECTED Final   Serratia marcescens NOT DETECTED NOT DETECTED Final   Carbapenem resistance NOT DETECTED NOT DETECTED Final   Haemophilus influenzae NOT DETECTED NOT DETECTED Final   Neisseria meningitidis NOT DETECTED NOT DETECTED Final   Pseudomonas aeruginosa NOT DETECTED NOT DETECTED Final   Candida albicans NOT DETECTED NOT DETECTED Final   Candida glabrata NOT DETECTED NOT DETECTED Final   Candida krusei NOT DETECTED NOT DETECTED Final   Candida parapsilosis NOT DETECTED NOT DETECTED Final   Candida tropicalis NOT DETECTED NOT DETECTED Final  Body fluid culture     Status: None   Collection Time:  03/16/2016  3:05 PM  Result Value Ref  Range Status   Specimen Description R Knee  Final   Special Requests NONE  Final   Gram Stain   Final    MODERATE WBC SEEN MODERATE GRAM POSITIVE COCCI IN PAIRS AND CHAINS    Culture   Final    HEAVY GROWTH GROUP A STREP (S.PYOGENES) ISOLATED There is no known Penicillin Resistant Beta Streptococcus in the [place] For patients that are Penicillin-allergic, Erythromycin is 85-94% susceptible, and Clindamycin is 80% susceptible.  Contact Microbiology within 7 days if sensitivity testing is  required.   NO ANAEROBES ISOLATED    Report Status 02/29/2016 FINAL  Final  Body fluid culture     Status: None   Collection Time: 03/19/2016  4:10 PM  Result Value Ref Range Status   Specimen Description R Knee  Final   Special Requests NONE  Final   Gram Stain   Final    MANY WBC SEEN MANY GRAM POSITIVE COCCI IN PAIRS AND CHAINS    Culture   Final    HEAVY GROWTH GROUP A STREP (S.PYOGENES) ISOLATED There is no known Penicillin Resistant Beta Streptococcus in the [place] For patients that are Penicillin-allergic, Erythromycin is 85-94% susceptible, and Clindamycin is 80% susceptible.  Contact Microbiology within 7 days if sensitivity testing is  required.   NO ANAEROBES ISOLATED    Report Status 02/29/2016 FINAL  Final  MRSA PCR Screening     Status: None   Collection Time: 02/27/16  3:35 AM  Result Value Ref Range Status   MRSA by PCR NEGATIVE NEGATIVE Final    Comment:        The GeneXpert MRSA Assay (FDA approved for NASAL specimens only), is one component of a comprehensive MRSA colonization surveillance program. It is not intended to diagnose MRSA infection nor to guide or monitor treatment for MRSA infections.   Urine culture     Status: None   Collection Time: 02/27/16  8:30 AM  Result Value Ref Range Status   Specimen Description URINE, RANDOM  Final   Special Requests NONE  Final   Culture NO GROWTH 1 DAY  Final   Report Status 02/28/2016 FINAL  Final  Culture, blood (single) w  Reflex to PCR ID Panel     Status: None   Collection Time: 02/29/16  2:36 PM  Result Value Ref Range Status   Specimen Description BLOOD LEFT HAND  Final   Special Requests BOTTLES DRAWN AEROBIC AND ANAEROBIC [place]  Final   Culture NO GROWTH 5 DAYS  Final   Report Status 03/05/2016 FINAL  Final  C difficile quick scan w PCR reflex     Status: None   Collection Time: 03/06/16  5:37 PM  Result Value Ref Range Status   C Diff antigen NEGATIVE NEGATIVE Final   C Diff toxin NEGATIVE NEGATIVE Final   C Diff interpretation Negative for C. difficile  Final  Culture, respiratory (NON-Expectorated)     Status: None   Collection Time: 03/07/16  8:20 AM  Result Value Ref Range Status   Specimen Description TRACHEAL ASPIRATE  Final   Special Requests NONE  Final   Gram Stain   Final    MODERATE WBC SEEN FEW SQUAMOUS EPITHELIAL CELLS PRESENT RARE YEAST    Culture Consistent with normal respiratory flora.  Final   Report Status 03/09/2016 FINAL  Final  Culture, respiratory (NON-Expectorated)     Status: None   Collection Time: 03/08/16  4:52 PM  Result Value Ref Range Status   Specimen Description TRACHEAL ASPIRATE  Final   Special Requests NONE  Final   Gram Stain NO ORGANISMS SEEN  Final   Culture Consistent with normal respiratory flora.  Final   Report Status 03/10/2016 FINAL  Final  Body fluid culture     Status: None (Preliminary result)   Collection Time: 03/13/16  3:49 PM  Result Value Ref Range Status   Specimen Description SYNOVIAL  Final   Special Requests Immunocompromised  Final   Gram Stain PENDING  Incomplete   Culture NO GROWTH < 24 HOURS  Final   Report Status PENDING  Incomplete    Medications:  Scheduled:  . antiseptic oral rinse  7 mL Mouth Rinse QID  . aspirin  325 mg Per Tube Daily  . budesonide (PULMICORT) nebulizer solution  0.25 mg Nebulization BID  . chlorhexidine gluconate (SAGE KIT)  15 mL Mouth Rinse BID  . famotidine  20 mg Per Tube Daily  .   HYDROmorphone (DILAUDID) injection  1 mg Intravenous BID  . insulin aspart  0-15 Units Subcutaneous 6 times per day  . ipratropium-albuterol  3 mL Nebulization Q6H  . pencillin G potassium IV  3 Million Units Intravenous 4 times per day  . pravastatin  40 mg Per Tube q1800  . sodium chloride flush  10-40 mL Intracatheter Q12H  . sodium phosphate  Dextrose 5% IVPB  20 mmol Intravenous Once   Infusions:  . alteplase    . dexmedetomidine 0.4 mcg/kg/hr (02/29/2016 0900)  . phenylephrine (NEO-SYNEPHRINE) Adult infusion 15 mcg/min (03/18/2016 0900)  . pureflow 3 each (02/27/2016 0751)    Assessment: 75 y/o M admitted with septic shock now requiring CRRT. Pharmacy consulted to adjust medication dosing for CRRT.   Plan:  No medications require adjustment at present.   Pharmacy will continue to monitor and adjust per consult.   Napoleon Form, RPh Clinical Pharmacist 03/06/2016,11:53 AM

## 2016-03-14 NOTE — Progress Notes (Signed)
Patient ID: John Schultz, male   DOB: Mar 08, 1941, 75 y.o.   MRN: [id number]   Consulted by Dr. Rayann Heman for gastrostomy tube placement given inability today to adequately transilluminate through abdominal wall with endoscope.  Radiographs show multiple surgical clips in LUQ and left lateral abdomen.  Will first obtain CT of abdomen and pelvis w/o contrast today/tonight to delineate anatomy better in determining if it is safe to proceed with percutaneous gastrostomy tube placement.  Will have Dr. Annamaria Boots review CT tomorrow.  Venetia Night. Kathlene Cote, M.D Pager:  [phone]

## 2016-03-14 NOTE — Progress Notes (Signed)
Subjective:   Wife at bedside.  PEG for later today  On phenylephrine 15  UF 3023 UOP 175  Objective:  Vital signs in last 24 hours:  Temp:  [97 F (36.1 C)-98.1 F (36.7 C)] 98 F (36.7 C) (04/19 0700) Pulse Rate:  [57-68] 65 (04/19 0700) Resp:  [16-21] 18 (04/19 0700) BP: (76-135)/(36-64) 114/53 mmHg (04/19 0700) SpO2:  [97 %-100 %] 100 % (04/19 0740) FiO2 (%):  [28 %] 28 % (04/19 0740) Weight:  [99.8 kg (220 lb 0.3 oz)] 99.8 kg (220 lb 0.3 oz) (04/19 0449)  Weight change: 0.6 kg (1 lb 5.2 oz) Filed Weights   03/22/2016 0500 03/13/16 0500 03/15/2016 0449  Weight: 104.9 kg (231 lb 4.2 oz) 99.2 kg (218 lb 11.1 oz) 99.8 kg (220 lb 0.3 oz)    Intake/Output:    Intake/Output Summary (Last 24 hours) at 02/27/2016 0902 Last data filed at 03/08/2016 0400  Gross per 24 hour  Intake 540.32 ml  Output   2905 ml  Net -2364.68 ml     Physical Exam: General: Critically ill appearing   HEENT ETT  Neck Trachea midling  Pulm/lungs Vent assisted, bilateral rhonchi, PRVC FiO2 28%  CVS/Heart tachycardic, soft systolic murmur  Abdomen:  Soft, non distended, BS present  Extremities: ++ peripheral edema,    Neurologic: Sedated, intubated  Skin: Heel skin breakdown  GU: Foley  Access:  Right femoral Vas-Cath/Dr. Lucky Cowboy 4/9    Basic Metabolic Panel:   Recent Labs Lab 03/21/2016 0549  03/21/2016 1951  03/13/16 0454 03/13/16 0942 03/13/16 1547 03/13/16 2136 03/13/2016 0424  NA 138  < >  --   < > 139 139 140 140 137  K 4.5  < >  --   < > 4.6 4.4 4.6 4.4 4.5  CL 107  < >  --   < > 109 108 107 107 106  CO2 30  < >  --   < > 25 23 23 26 27   GLUCOSE 149*  < >  --   < > 128* 134* 168* 185* 165*  BUN 29*  < >  --   < > 20 19 18 16 13   CREATININE 0.69  < >  --   < > 0.59* 0.66 0.64 0.63 0.64  CALCIUM 8.5*  < >  --   < > 8.3* 8.3* 8.4* 8.3* 8.1*  MG 1.9  --  1.7  --  1.7  --   --  1.6* 1.7  PHOS  --   < >  --   < > 2.4* 2.4* 2.8 2.3* 2.2*  < > = values in this interval not  displayed.   CBC:  Recent Labs Lab 03/09/16 0419 03/10/16 0426 03/11/16 0348 03/11/16 2155 03/24/2016 0549  WBC 10.1 7.8 8.2 6.3 7.8  HGB 7.4* 7.3* 7.3* 6.6* 9.3*  HCT 22.0* 22.0* 22.0* 20.4* 27.3*  MCV 92.9 93.5 93.2 92.7 92.2  PLT 84* 75* 73* 66* 69*      Microbiology:  Recent Results (from the past 720 hour(s))  Blood culture (routine x 2)     Status: Abnormal   Collection Time: 03/25/2016 12:59 PM  Result Value Ref Range Status   Specimen Description BLOOD LEFT FOREARM  Final   Special Requests BOTTLES DRAWN AEROBIC AND ANAEROBIC  1CC  Final   Culture  Setup Time   Final    GRAM POSITIVE COCCI AEROBIC BOTTLE ONLY CRITICAL VALUE NOTED.  VALUE IS CONSISTENT WITH PREVIOUSLY REPORTED AND CALLED  VALUE.    Culture STREPTOCOCCUS PYOGENES AEROBIC BOTTLE ONLY  (A)  Final   Report Status 03/03/2016 FINAL  Final   Organism ID, Bacteria STREPTOCOCCUS PYOGENES  Final      Susceptibility   Streptococcus pyogenes - MIC*    CLINDAMYCIN Value in next row Sensitive      SENSITIVE0.25    AMPICILLIN Value in next row Sensitive      SENSITIVE0.25    ERYTHROMYCIN Value in next row Sensitive      SENSITIVE0.12    VANCOMYCIN Value in next row Sensitive      SENSITIVE0.5    CEFTRIAXONE Value in next row Sensitive      SENSITIVE0.12    LEVOFLOXACIN Value in next row Sensitive      SENSITIVE0.5    * STREPTOCOCCUS PYOGENES  Rapid Influenza A&B Antigens ([facility] only)     Status: None   Collection Time: 03/25/2016  1:00 PM  Result Value Ref Range Status   Influenza A ([facility]) NEGATIVE NEGATIVE Final   Influenza B ([facility]) NEGATIVE NEGATIVE Final  Blood culture (routine x 2)     Status: None   Collection Time: 03/01/2016  1:40 PM  Result Value Ref Range Status   Specimen Description BLOOD LEFT FOREARM  Final   Special Requests BOTTLES DRAWN AEROBIC AND ANAEROBIC  4CC  Final   Culture  Setup Time   Final    GRAM POSITIVE COCCI IN BOTH AEROBIC AND ANAEROBIC BOTTLES CRITICAL RESULT CALLED TO,  READ BACK BY AND VERIFIED WITH: NATE COOKSON AT 0426 02/27/16 CAF    Culture   Final    STREPTOCOCCUS PYOGENES IN BOTH AEROBIC AND ANAEROBIC BOTTLES    Report Status 02/29/2016 FINAL  Final   Organism ID, Bacteria STREPTOCOCCUS PYOGENES  Final      Susceptibility   Streptococcus pyogenes - MIC*    CLINDAMYCIN Value in next row Sensitive      SENSITIVE<=0.25    AMPICILLIN Value in next row Sensitive      SENSITIVE<=0.25    ERYTHROMYCIN Value in next row Sensitive      SENSITIVE0.12    VANCOMYCIN Value in next row Sensitive      SENSITIVE0.5    CEFTRIAXONE Value in next row Sensitive      SENSITIVE0.12    LEVOFLOXACIN Value in next row Sensitive      SENSITIVE0.5    * STREPTOCOCCUS PYOGENES  Blood Culture ID Panel (Reflexed)     Status: Abnormal   Collection Time: 03/16/2016  1:40 PM  Result Value Ref Range Status   Enterococcus species NOT DETECTED NOT DETECTED Final   Vancomycin resistance NOT DETECTED NOT DETECTED Final   Listeria monocytogenes NOT DETECTED NOT DETECTED Final   Staphylococcus species NOT DETECTED NOT DETECTED Final   Staphylococcus aureus NOT DETECTED NOT DETECTED Final   Methicillin resistance NOT DETECTED NOT DETECTED Final   Streptococcus species NOT DETECTED NOT DETECTED Final   Streptococcus agalactiae NOT DETECTED NOT DETECTED Final   Streptococcus pneumoniae NOT DETECTED NOT DETECTED Final   Streptococcus pyogenes DETECTED (A) NOT DETECTED Final    Comment: CRITICAL RESULT CALLED TO, READ BACK BY AND VERIFIED WITH: NATE COOKSON AT 0426 02/27/16 CAF    Acinetobacter baumannii NOT DETECTED NOT DETECTED Final   Enterobacteriaceae species NOT DETECTED NOT DETECTED Final   Enterobacter cloacae complex NOT DETECTED NOT DETECTED Final   Escherichia coli NOT DETECTED NOT DETECTED Final   Klebsiella oxytoca NOT DETECTED NOT DETECTED Final   Klebsiella pneumoniae NOT  DETECTED NOT DETECTED Final   Proteus species NOT DETECTED NOT DETECTED Final   Serratia  marcescens NOT DETECTED NOT DETECTED Final   Carbapenem resistance NOT DETECTED NOT DETECTED Final   Haemophilus influenzae NOT DETECTED NOT DETECTED Final   Neisseria meningitidis NOT DETECTED NOT DETECTED Final   Pseudomonas aeruginosa NOT DETECTED NOT DETECTED Final   Candida albicans NOT DETECTED NOT DETECTED Final   Candida glabrata NOT DETECTED NOT DETECTED Final   Candida krusei NOT DETECTED NOT DETECTED Final   Candida parapsilosis NOT DETECTED NOT DETECTED Final   Candida tropicalis NOT DETECTED NOT DETECTED Final  Body fluid culture     Status: None   Collection Time: 03/10/2016  3:05 PM  Result Value Ref Range Status   Specimen Description R Knee  Final   Special Requests NONE  Final   Gram Stain   Final    MODERATE WBC SEEN MODERATE GRAM POSITIVE COCCI IN PAIRS AND CHAINS    Culture   Final    HEAVY GROWTH GROUP A STREP (S.PYOGENES) ISOLATED There is no known Penicillin Resistant Beta Streptococcus in the [place] For patients that are Penicillin-allergic, Erythromycin is 85-94% susceptible, and Clindamycin is 80% susceptible.  Contact Microbiology within 7 days if sensitivity testing is  required.   NO ANAEROBES ISOLATED    Report Status 02/29/2016 FINAL  Final  Body fluid culture     Status: None   Collection Time: 03/02/2016  4:10 PM  Result Value Ref Range Status   Specimen Description R Knee  Final   Special Requests NONE  Final   Gram Stain   Final    MANY WBC SEEN MANY GRAM POSITIVE COCCI IN PAIRS AND CHAINS    Culture   Final    HEAVY GROWTH GROUP A STREP (S.PYOGENES) ISOLATED There is no known Penicillin Resistant Beta Streptococcus in the [place] For patients that are Penicillin-allergic, Erythromycin is 85-94% susceptible, and Clindamycin is 80% susceptible.  Contact Microbiology within 7 days if sensitivity testing is  required.   NO ANAEROBES ISOLATED    Report Status 02/29/2016 FINAL  Final  MRSA PCR Screening     Status: None   Collection Time: 02/27/16   3:35 AM  Result Value Ref Range Status   MRSA by PCR NEGATIVE NEGATIVE Final    Comment:        The GeneXpert MRSA Assay (FDA approved for NASAL specimens only), is one component of a comprehensive MRSA colonization surveillance program. It is not intended to diagnose MRSA infection nor to guide or monitor treatment for MRSA infections.   Urine culture     Status: None   Collection Time: 02/27/16  8:30 AM  Result Value Ref Range Status   Specimen Description URINE, RANDOM  Final   Special Requests NONE  Final   Culture NO GROWTH 1 DAY  Final   Report Status 02/28/2016 FINAL  Final  Culture, blood (single) w Reflex to PCR ID Panel     Status: None   Collection Time: 02/29/16  2:36 PM  Result Value Ref Range Status   Specimen Description BLOOD LEFT HAND  Final   Special Requests BOTTLES DRAWN AEROBIC AND ANAEROBIC [place]  Final   Culture NO GROWTH 5 DAYS  Final   Report Status 03/05/2016 FINAL  Final  C difficile quick scan w PCR reflex     Status: None   Collection Time: 03/06/16  5:37 PM  Result Value Ref Range Status   C Diff antigen NEGATIVE NEGATIVE  Final   C Diff toxin NEGATIVE NEGATIVE Final   C Diff interpretation Negative for C. difficile  Final  Culture, respiratory (NON-Expectorated)     Status: None   Collection Time: 03/07/16  8:20 AM  Result Value Ref Range Status   Specimen Description TRACHEAL ASPIRATE  Final   Special Requests NONE  Final   Gram Stain   Final    MODERATE WBC SEEN FEW SQUAMOUS EPITHELIAL CELLS PRESENT RARE YEAST    Culture Consistent with normal respiratory flora.  Final   Report Status 03/09/2016 FINAL  Final  Culture, respiratory (NON-Expectorated)     Status: None   Collection Time: 03/08/16  4:52 PM  Result Value Ref Range Status   Specimen Description TRACHEAL ASPIRATE  Final   Special Requests NONE  Final   Gram Stain NO ORGANISMS SEEN  Final   Culture Consistent with normal respiratory flora.  Final   Report Status  03/10/2016 FINAL  Final    Coagulation Studies: No results for input(s): LABPROT, INR in the last 72 hours.  Urinalysis: No results for input(s): COLORURINE, LABSPEC, PHURINE, GLUCOSEU, HGBUR, BILIRUBINUR, KETONESUR, PROTEINUR, UROBILINOGEN, NITRITE, LEUKOCYTESUR in the last 72 hours.  Invalid input(s): APPERANCEUR    Imaging: Dg Chest 1 View  03/13/2016  CLINICAL DATA:  PICC placement EXAM: CHEST 1 VIEW COMPARISON:  Yesterday FINDINGS: Right upper extremity PICC with tip at the upper cavoatrial junction. Right IJ central line is unmoved. New tracheostomy tube and removal of orogastric tube. Persistent bibasilar lung opacity with low volumes. No pneumothorax or pneumomediastinum is seen. Chronic cardiomegaly. Status post CABG. IMPRESSION: 1. Right upper extremity PICC with tip at the upper cavoatrial junction. 2. New tracheostomy tube. 3. Low volume chest with bibasilar atelectasis or pneumonia. Electronically Signed   By: Monte Fantasia M.D.   On: 03/13/2016 12:39     Medications:   . alteplase    . dexmedetomidine 0.4 mcg/kg/hr (03/16/2016 0800)  . phenylephrine (NEO-SYNEPHRINE) Adult infusion 15 mcg/min (03/08/2016 0800)  . pureflow 3 each (03/01/2016 0751)   . antiseptic oral rinse  7 mL Mouth Rinse QID  . aspirin  325 mg Per Tube Daily  . budesonide (PULMICORT) nebulizer solution  0.25 mg Nebulization BID  . chlorhexidine gluconate (SAGE KIT)  15 mL Mouth Rinse BID  . famotidine  20 mg Per Tube Daily  .  HYDROmorphone (DILAUDID) injection  1 mg Intravenous BID  . insulin aspart  0-15 Units Subcutaneous 6 times per day  . ipratropium-albuterol  3 mL Nebulization Q6H  . pencillin G potassium IV  3 Million Units Intravenous 4 times per day  . pravastatin  40 mg Per Tube q1800  . sodium chloride flush  10-40 mL Intracatheter Q12H   acetaminophen **OR** [DISCONTINUED] acetaminophen, albuterol, alteplase, fentaNYL (SUBLIMAZE) injection, heparin, midazolam, ondansetron **OR**  ondansetron (ZOFRAN) IV, sodium chloride flush  Assessment/ Plan:  75 y.o. white  male  with coronary disease with four-vessel CABG in 1998, gallbladder rupture,  Diabetes with complications of retinopathy, peripheral neuropathy, peripheral vascular disease, Aorto iliac bypass, angioplasty and stent in his left leg, kyphoplasty for chronic back pain, CKD st 3 with Baseline Cr 1.5/ GFR 46  1. Acute renal failure on chronic kidney disease stage III: baseline creatinine of 1.5, eGFR of 46. Chronic kidney disease secondary to diabetic nephropathy. Requiring CRRT. On vasopressors. Oliguric urine output.  Complicated acute renal failure with Anasarca, hypotension, metabolic acidosis, hyponatremia - Continue CRRT   2. Rt Knee septic arthritis with sepsis: strep  pyogenes. Appreciate ortho and infectious disease input.  - penicillin G   3. Acute resp faliure. Intubated 4/3, extubated and re-intubated 4/13. Now with tracheostomy 4/17.  - PEG for later today.   4. Anemia with kidney failure: status post transfusion 4/16. No CBC today. Platelets trending downward.    LOS: [place], [place] 4/19/20179:02 AM

## 2016-03-14 NOTE — Progress Notes (Signed)
Nutrition Follow-up  DOCUMENTATION CODES:   Not applicable  INTERVENTION:  - PEG placement planned later today; plan to restart nutrition post PEG placement hopefully within the next 24 hours. Will reassess nutritional needs and adjust TF prescription on follow-up.  NUTRITION DIAGNOSIS:   Inadequate oral intake related to acute illness as evidenced by NPO status. Being addressed as PEG placement planned  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, respiratory distress with severe acidosis requiring intubation on 02/27/16  Pt remains on vent s/p trach placement on 03/22/2016, plan for PEG tube later today, remains on CRRT, neosynephrine at 15 mcg/min   Diet Order:   NPO  Digestive System: no OG/NG at present post trach; abdomen soft/obese  Skin:   (stage I buttock, stage II sacral pressure ulcer)  Last BM:  03/13/2016 diarrhea, rectal tube, C.diff negative   Labs: phosphorus 1.8 (plan for replacement being addressed)  Meds: ss novolog, neo-synephrine, precedex   Height:   Ht Readings from Last 1 Encounters:  02/28/16 6' (1.829 m)    Weight:   Wt Readings from Last 1 Encounters:  03/25/2016 220 lb 0.3 oz (99.8 kg)    Ideal Body Weight:  80.9 kg  BMI:  Body mass index is 29.83 kg/(m^2).  Estimated Nutritional Needs:   Kcal:  1903 kcals (Ve: 14.2, Tmax: 36.8)   Protein:  128-170 g (1.5-2.0 g/kg)   Fluid:  >1.9 L  EDUCATION NEEDS:   Education needs no appropriate at this time  [place], [place], [place] [phone] Pager  [phone] Weekend/On-Call Pager

## 2016-03-14 NOTE — Care Management (Signed)
Met with patient's wife and she would like for him to go to [place] at [employer]. [place] visiting with wife now. PEG today. Trach in place.

## 2016-03-14 NOTE — Op Note (Signed)
[facility] Patient Name: John Schultz Procedure Date: 03/02/2016 1:11 PM MRN: [id number] Account #: [id number] Date of Birth: [birth year] Admit Type: Inpatient Age: 75 Room: [facility] ENDO ROOM 1 Gender: Male Note Status: Finalized Procedure:            Upper GI endoscopy Indications:          Place PEG due to impaired swallowing, Place PEG because                        patient requires ventilator support Patient Profile:      This is a 75 year old male. Providers:            Gerrit Heck. Rayann Heman, MD Referring MD:         Critical Care Unit Medicines:            Fentanyl 50 micrograms IV, Midazolam 2 mg IV Complications:        No immediate complications. Procedure:            Pre-Anesthesia Assessment:                       - Prior to the procedure, a History and Physical was                        performed, and patient medications, allergies and                        sensitivities were reviewed. The patient's tolerance of                        previous anesthesia was reviewed.                       After obtaining informed consent, the endoscope was                        passed under direct vision. Throughout the procedure,                        the patient's blood pressure, pulse, and oxygen                        saturations were monitored continuously. The Endoscope                        was introduced through the mouth, and advanced to the                        second part of duodenum. The upper GI endoscopy was                        unusually difficult due to abnormal anatomy. The                        patient tolerated the procedure well. Findings:      The esophagus was normal.      Excessive fluid was found in the gastric body. Fluid aspiration for       cytology was performed.      There was evidence of a closed previous gastrostomy present in the  gastric body. This was characterized by healthy appearing mucosa.      The  examined duodenum was normal.      - Esophageal intubation was difficult. Ecchymosis and small amt oozing       observed in hypopharynx.      - Unable to transilluminat so PEG not attempted. Impression:           - Normal esophagus.                       - Excessive gastric fluid. Fluid aspiration performed.                       - Closed previous gastrostomy present characterized by                        healthy appearing mucosa.                       - Normal examined duodenum.                       - Esophageal intubation was difficult. Ecchymosis and                        small amt oozing observed in hypopharynx.                       - Unable to transilluminate so PEG not attempted. Recommendation:       - Return patient to ICU for ongoing care.                       - Will contact interventional radiology regarding                        attempt at G-tube placment.                       - The findings and recommendations were discussed with                        the patient's family. Procedure Code(s):    --- Professional ---                       [phone], Esophagogastroduodenoscopy, flexible, transoral;                        diagnostic, including collection of specimen(s) by                        brushing or washing, when performed (separate procedure) Diagnosis Code(s):    --- Professional ---                       [id number], Other specified postprocedural states                       R13.10, Dysphagia, unspecified                       Z43.1, Encounter for attention to gastrostomy                       Z99.11, Dependence  on respirator [ventilator] status CPT copyright 2016 [employer]. All rights reserved. The codes documented in this report are preliminary and upon coder review may  be revised to meet current compliance requirements. Mellody Life, MD 03/19/2016 2:24:10 PM This report has been signed electronically. Number of Addenda: 0 Note Initiated On:  03/06/2016 1:11 PM      [facility]

## 2016-03-14 NOTE — Progress Notes (Addendum)
PULMONARY / CRITICAL CARE MEDICINE   Name: John Schultz MRN: [id number] DOB: June 09, 1941    ADMISSION DATE:  03/15/2016  BRIEF HISTORY: 75 year old male past medical history of hypertension, chronic diastolic heart failure, CK, diabetes, presented on April 2 with four-day history of progressive right knee pain fever, shortness of breath, cough. Within 24 hours decompensated requiring intubation on 04/03.  Seen by orthopedics, had drain placed in right knee with purulent material. Now with mechanical ventilation and unable to weaned off after failed extubation 04/13 2/2  stridor and poor cough.   SUBJECTIVE:  No acute issues overnight.On precedex.  Remains on full vent support and CRRT. Requiring pressors at this time SIGNIFICANT EVENTS/STUDIES: 04/02 admitted by Hospitalist Service with 4 day history of right knee pain - swollen, erythematous and tender on exam - and severe sepsis, AKI, abnormal EKG. Cardiac markers positive (peak troponin I 8.10) 04/03 Orthopedics consultation and aspiration of R Knee, with drain placement 04/03 ID consultation 04/03 Nephrology consultation 04/04 TTE: LVEF 60-65%, no vegetations 04/04 altered mental status, worsening respiratory status, septic, intubated 04/04 R knee hemovac removed by ortho 04/06 Cardiology consultation: NSTEMI - deemed secondary to demand ischemia. Further W/U to be considered after resolution of severe sepsis and critical illness 04/09 CRRT initiated  04/10 agitated on WUA. Failed SBT. Off and on norepienphrine 04/11 Tolerating volume removal by CRRT. Failed SBT. Tolerated PS 10-14 cm H2O 04/12 Failed SBT. Agitated on WUA. Uo improving. Started back on fentanyl gtt in PM 04/13 LE venous US:  04/13 Passed SBT. Cognition remains marginal. Extubated. Post extubation stridor and poor cough. BiPAP initiated. High risk of re-intubation 04/13 Re-intubated in afternoon. ENT consult for trach tube placement - planned 04/17.  04/13 CT head:  diffuse atrophy, no acute disease 04/14 CRRT resumed due to hypervolemia 4/17 trach by ENT 4/18 started back on levophed 4/19 PEG by GI attempted, unsuccessful  INDWELLING DEVICES:: R IJ CVL 04/03 >> 4/18 R arm PICC 4/18>> ETT 04/03 >> 04/13, 04/13 >>  R femoral HD cath 04/09 >>   MICRO DATA: 04/02 MRSA PCR >> NEG 04/02 flu screen >> NEG 04/02 blood >> Strep pyogenes 04/02 knee aspirate >> Strep pyogenes 04/05 blood >> NEG 04/11 C diff >> NEG 04/12 Resp >> NOF 04/13 Resp >> normal respiratory flora 04/18 R knee aspirate>>  PCT algorithm 04/03: 22.50 > 16.65 > 7.12 PCT algorithm 04/12: 0.62 > 0.57 > 2.66  ANTIMICROBIALS:  Ceftriaxone 04/02 X 1  Vanc 04/02 >> 04/03 Pip-tazo 04/02 >> 04/03 Clinda 04/03 >> 04/07 Pen G 04/03 >>    VITAL SIGNS: Temp:  [97 F (36.1 C)-98.1 F (36.7 C)] 98 F (36.7 C) (04/19 0700) Pulse Rate:  [57-75] 71 (04/19 1000) Resp:  [16-21] 21 (04/19 1000) BP: (76-135)/(36-64) 103/57 mmHg (04/19 1000) SpO2:  [97 %-100 %] 100 % (04/19 1103) FiO2 (%):  [28 %] 28 % (04/19 1103) Weight:  [220 lb 0.3 oz (99.8 kg)] 220 lb 0.3 oz (99.8 kg) (04/19 0449) HEMODYNAMICS: CVP:  [8 mmHg] 8 mmHg VENTILATOR SETTINGS: Vent Mode:  [-] PRVC FiO2 (%):  [28 %] 28 % Set Rate:  [16 bmp] 16 bmp Vt Set:  [500 mL] 500 mL PEEP:  [5 cmH20] 5 cmH20 Plateau Pressure:  [19 cmH20] 19 cmH20 INTAKE / OUTPUT:  Intake/Output Summary (Last 24 hours) at 03/21/2016 1321 Last data filed at 02/27/2016 0900  Gross per 24 hour  Intake 489.12 ml  Output   2772 ml  Net -2282.88 ml  Review of Systems  Unable to perform ROS: intubated    Physical Exam  Constitutional:  RASS -3, not F/C  HENT:  Head: Normocephalic and atraumatic.  Eyes: EOM are normal. Pupils are equal, round, and reactive to light.  Cardiovascular: Regular rhythm and normal heart sounds.   No murmur heard. Pulmonary/Chest: No respiratory distress. He has no wheezes. He has no rales.  Abdominal: Soft.  Bowel sounds are normal. He exhibits no distension. There is no tenderness.  Musculoskeletal:  R>L LE edema  Neurological: He has normal reflexes.  Moves all extremities     LABS:  CBC  Recent Labs Lab 03/11/16 2155 03/23/2016 0549 02/28/2016 1020  WBC 6.3 7.8 4.9  HGB 6.6* 9.3* 8.2*  HCT 20.4* 27.3* 24.8*  PLT 66* 69* 71*   Coag's No results for input(s): APTT, INR in the last 168 hours. BMET  Recent Labs Lab 03/13/16 2136 03/06/2016 0424 03/13/2016 0958  NA 140 137 139  K 4.4 4.5 4.1  CL 107 106 107  CO2 26 27 27   BUN 16 13 12   CREATININE 0.63 0.64 0.63  GLUCOSE 185* 165* 173*   Electrolytes  Recent Labs Lab 03/13/16 0454  03/13/16 2136 03/24/2016 0424 03/19/2016 0958  CALCIUM 8.3*  < > 8.3* 8.1* 8.3*  MG 1.7  --  1.6* 1.7  --   PHOS 2.4*  < > 2.3* 2.2* 1.8*  < > = values in this interval not displayed. Sepsis Markers  Recent Labs Lab 03/08/16 0630 03/09/16 0419 03/10/16 0426  PROCALCITON 0.57 2.66 2.35   ABG  Recent Labs Lab 03/11/16 0457 03/08/2016 0500 03/13/16 0800  PHART 7.50* 7.50* 7.41  PCO2ART 36 35 38  PO2ART 104 129* 89   Liver Enzymes  Recent Labs Lab 03/10/16 0426  03/13/16 2136 03/06/2016 0424 03/10/2016 0958  AST 39  --   --   --   --   ALT 21  --   --   --   --   ALKPHOS 93  --   --   --   --   BILITOT 0.6  --   --   --   --   ALBUMIN 1.4*  < > 2.1* 2.0* 2.0*  < > = values in this interval not displayed. Cardiac Enzymes  Recent Labs Lab 03/09/16 0419  TROPONINI 1.05*   Glucose  Recent Labs Lab 03/13/16 1629 03/13/16 1951 03/13/16 2359 03/04/2016 0438 03/21/2016 0722 03/16/2016 1116  GLUCAP 168* 151* 114* 112* 130* 108*    CXR: images from 4/16  Continued bilateral airspace opacities with persistent perihilar and lower lobe airspace disease   DISCUSSION: 75 year old male with septic right knee, developed sepsis/renal failure requiring CRRT, also requiring intubation, now status post tracheostomy on 02/27/2016.  Delirium/agitation, and poor urine output, along with inability to completely wean off ventilator are limiting factors to his overall clinical improvement.  ASSESSMENT / PLAN:  PULMONARY Acute vent dep respiratory failure - s/p trach Pulm edema pattern on CXR Failed extubation 04/13 S/P trach 4/17 by ENT P: Cont vent support - settings reviewed and/or adjusted Cont vent bundle Daily SBT and sedation vacation;  S/p trach 4/17  CARDIOVASCULAR Septic shock, resolved NSTEMI - Cardiology has seen PAF P:  MAP goal > 60 mmHg PRN phenylephrine Cont ASA and statin Further cardiac w/u after critical illness resolves per cardiology  RENAL AKI Severe hypervolemia P: Monitor BMET intermittently Monitor I/Os Correct electrolytes as indicated Renal Service following CRRT  resumed 04/14; continue per nephrology  Nephrology considering a trial of HD on 4/20  GASTROINTESTINAL Diarrhea  Hemoccult positive  FEN P: SUP: enteral famotidine Cont TFs Monitor for bleeding GI attempted PEG on 4/19, unsuccessful  HEMATOLOGIC Anemia without overt bleeding Thrombocytopenia  P: DVT px: SCDs (SQ heparin DC'd 04/12) Monitor CBC intermittently Transfuse per usual guidelines  INFECTIOUS Septic arthritis of the R knee Purulent ET secretions - much improved, now with Trach Small increase in PCT 04/14 - unclear significance P: Monitor temp, WBC count Micro and abx as above ID service following - discussed with them, still with intermitted use of pressors and still tender and warn, will discuss with Ortho about re-aspirating the Right knee Remove RIJ CVL and place PICC  ENDOCRINE DM 2, controlled Hyperglycemia improved P: Cont Lantus Cont SSI  NEUROLOGIC ICU acquired delirium - hypoactive Profound deconditioning P: RASS goal: 0, -1 Precedex initiated 04/14; continue to titrate to RASS goal Monitor for bradycardia PRN fentanyl Willl need extensive rehab if he survives this  acute phase of illness  Family - wife at bedside, updated on clinical status, she is in agreement with PEG placement today and current plan of care.   I have personally obtained a history, examined the patient, evaluated Pertinent laboratory and [employer]/imaging results, and  formulated the assessment and plan   The Patient requires high complexity decision making for assessment and support, frequent evaluation and titration of therapies, application of advanced monitoring technologies and extensive interpretation of multiple databases. Critical Care Time devoted to patient care services described in this note is 40 minutes.   Overall, patient is critically ill, prognosis is guarded.  Patient with Multiorgan failure and at high risk for cardiac arrest and death.   Vilinda Boehringer, MD [facility] Pager [phone] (please enter 7-digits) On Call Pager - [phone] (please enter 7-digits)

## 2016-03-14 NOTE — Consult Note (Signed)
   [facility] CM Inpatient Consult   03/16/2016  John Schultz 02/10/1941 [id number]  Patient screened for potential [place] Management services. Patient is eligible for [facility]. Epic reveals patient's discharge plan is LTAC.  [facility] Care Management services not appropriate at this time. If patient's post hospital needs change please place a [facility] Care Management consult. For questions please contact:   Corinthian Mizrahi RN, [facility] Liaison  [phone]) Business Mobile [phone]) Toll free office

## 2016-03-14 NOTE — Progress Notes (Signed)
GI Note:  PEG placement attempted.   Esophageal intubation was difficult with some resulting ecchymosis in hypopharynx.  Unable to transilluminate likely due to scar tissue.  Therefore, PEG placement not attempted.   Recs: - attempt at G-tube placement by interventional radiology.   May be extremely difficult to place NG tube if this is required by interventional radiology for air insufflation.

## 2016-03-14 NOTE — H&P (Addendum)
Primary Care Physician:  Dion Body, MD  Pre-Procedure History & Physical: HPI:  John Schultz is a 75 y.o. male is here for an endoscopy with PEG placement   Past Medical History  Diagnosis Date  . CHF (congestive heart failure) ([place])   . DM (diabetes mellitus) ([place])   . Pancreatic cyst   . Skin cancer   . DDD (degenerative disc disease), thoracic   . Renal failure   . Respiratory failure [facility])     Past Surgical History  Procedure Laterality Date  . Aortoiliac bypass    . Cholecystectomy    . Kyphoplasty    . Tracheostomy tube placement N/A 02/29/2016    Procedure: TRACHEOSTOMY;  Surgeon: Carloyn Manner, MD;  Location: [facility] ORS;  Service: ENT;  Laterality: N/A;    Prior to Admission medications   Medication Sig Start Date End Date Taking? Authorizing Provider  acetaminophen (TYLENOL) 500 MG tablet Take 1,000 mg by mouth every 8 (eight) hours as needed for mild pain, moderate pain or fever.   Yes Historical Provider, MD  alendronate (FOSAMAX) 70 MG tablet Take 70 mg by mouth once a week. Take with a full glass of water on an empty stomach. Take on Sunday.   Yes Historical Provider, MD  amLODipine (NORVASC) 5 MG tablet Take 5 mg by mouth daily.   Yes Historical Provider, MD  aspirin 81 MG EC tablet Take 81 mg by mouth daily. Swallow whole.   Yes Historical Provider, MD  B Complex Vitamins (VITAMIN B-COMPLEX) TABS Take 1 tablet by mouth daily.   Yes Historical Provider, MD  calcium-vitamin D (OSCAL WITH D) 500-200 MG-UNIT tablet Take 1 tablet by mouth daily.   Yes Historical Provider, MD  gabapentin (NEURONTIN) 300 MG capsule Take 300-600 mg by mouth See admin instructions. Take 1 capsule by mouth in the morning, Take 1 capsule by mouth at lunch, and take 2 capsules (600mg ) by mouth every night at bedtime.   Yes Historical Provider, MD  insulin aspart (NOVOLOG) 100 UNIT/ML FlexPen Inject 5 Units into the skin 3 (three) times daily with meals. **If <80 do not take! If  >200 use 2 units every 50 over 200.**   Yes Historical Provider, MD  insulin glargine (LANTUS) 100 unit/mL SOPN Inject 15 Units into the skin every evening.   Yes Historical Provider, MD  isosorbide mononitrate (IMDUR) 30 MG 24 hr tablet Take 30 mg by mouth daily. 12/14/15  Yes Historical Provider, MD  lovastatin (MEVACOR) 40 MG tablet Take 80 mg by mouth at bedtime.   Yes Historical Provider, MD  metFORMIN (GLUCOPHAGE) 500 MG tablet Take 1,500 mg by mouth daily with supper.   Yes Historical Provider, MD  Multiple Vitamin (MULTIVITAMIN) tablet Take 1 tablet by mouth daily.   Yes Historical Provider, MD  torsemide (DEMADEX) 20 MG tablet Take 40 mg by mouth every morning.   Yes Historical Provider, MD  valsartan (DIOVAN) 160 MG tablet Take 80-160 mg by mouth See admin instructions. Take 1/2 tablet (80mg ) by mouth in the morning, and 1 tablet by mouth every night at bedtime. 11/15/15  Yes Historical Provider, MD    Allergies as of 03/11/2016 - Review Complete 02/28/2016  Allergen Reaction Noted  . Tape Rash 03/19/2016    Family History  Problem Relation Age of Onset  . CAD Mother   . Hypertension Mother   . Hypertension Father   . Prostate cancer Father   . Colon cancer Brother     Social History  Social History  . Marital Status: Married    Spouse Name: N/A  . Number of Children: N/A  . Years of Education: N/A   Occupational History  . Not on file.   Social History Main Topics  . Smoking status: Never Smoker   . Smokeless tobacco: Never Used  . Alcohol Use: No  . Drug Use: No  . Sexual Activity: Not on file   Other Topics Concern  . Not on file   Social History Narrative     Physical Exam: BP 103/57 mmHg  Pulse 71  Temp(Src) 98 F (36.7 C) (Axillary)  Resp 21  Ht 6' (1.829 m)  Wt 99.8 kg (220 lb 0.3 oz)  BMI 29.83 kg/m2  SpO2 100% General:  Sedated, non-responsive Head:  Normocephalic and atraumatic. Neck:  Supple; no masses or thyromegaly, +trach Lungs:  coarse bilat Heart:  Regular rate and rhythm. Abdomen:  Soft, nontender and nondistended. Normal bowel sounds, without guarding, and without rebound.   Neurologic:  Alert and  oriented x0;  Impression/Plan: John Schultz is here for an endoscopy with PEG placement to be performed for inability to wean off vent  Risks, benefits, limitations, and alternatives regarding  Endoscopy with PEG placement have been reviewed with the patient's wife.  Questions have been answered.  All parties agreeable.   Josefine Class, MD  03/12/2016, 1:18 PM

## 2016-03-14 NOTE — Progress Notes (Signed)
LCSW has been following acutely and reviewed case with CM. Patient will be referred to [facility], thus LCSW will sign off as consult was for SNF rehab. Wife is agreeable to plan.  If needs arise, please re consult CSW if needs arise. CSW signing off case.  Lane Hacker, MSW Clinical Social Work: System [employer] [phone]

## 2016-03-15 ENCOUNTER — Inpatient Hospital Stay: Payer: Commercial Managed Care - HMO

## 2016-03-15 LAB — RENAL FUNCTION PANEL
ALBUMIN: 1.9 g/dL — AB (ref 3.5–5.0)
ALBUMIN: 1.9 g/dL — AB (ref 3.5–5.0)
ANION GAP: 3 — AB (ref 5–15)
Anion gap: 6 (ref 5–15)
BUN: 11 mg/dL (ref 6–20)
BUN: 10 mg/dL (ref 6–20)
CALCIUM: 8.3 mg/dL — AB (ref 8.9–10.3)
CO2: 27 mmol/L (ref 22–32)
CO2: 29 mmol/L (ref 22–32)
Calcium: 8.4 mg/dL — ABNORMAL LOW (ref 8.9–10.3)
Chloride: 107 mmol/L (ref 101–111)
Chloride: 108 mmol/L (ref 101–111)
Creatinine, Ser: 0.69 mg/dL (ref 0.61–1.24)
Creatinine, Ser: 0.66 mg/dL (ref 0.61–1.24)
GFR calc Af Amer: >60 mL/min (ref >60)
GFR calc non Af Amer: >60 mL/min (ref >60)
GLUCOSE: 111 mg/dL — AB (ref 65–99)
Glucose, Bld: 135 mg/dL — ABNORMAL HIGH (ref 65–99)
PHOSPHORUS: 2.9 mg/dL (ref 2.5–4.6)
PHOSPHORUS: 2.9 mg/dL (ref 2.5–4.6)
POTASSIUM: 4.1 mmol/L (ref 3.5–5.1)
Potassium: 4.3 mmol/L (ref 3.5–5.1)
SODIUM: 140 mmol/L (ref 135–145)
SODIUM: 140 mmol/L (ref 135–145)

## 2016-03-15 LAB — CBC
HCT: 24.6 % — ABNORMAL LOW (ref 40.0–52.0)
Hemoglobin: 8.1 g/dL — ABNORMAL LOW (ref 13.0–18.0)
MCH: 30.1 pg (ref 26.0–34.0)
MCHC: 32.7 g/dL (ref 32.0–36.0)
MCV: 92.2 fL (ref 80.0–100.0)
PLATELETS: 65 10*3/uL — AB (ref 150–440)
RBC: 2.67 MIL/uL — AB (ref 4.40–5.90)
RDW: 14.8 % — ABNORMAL HIGH (ref 11.5–14.5)
WBC: 3.7 10*3/uL — AB (ref 3.8–10.6)

## 2016-03-15 LAB — MAGNESIUM: MAGNESIUM: 1.6 mg/dL — AB (ref 1.7–2.4)

## 2016-03-15 LAB — GLUCOSE, CAPILLARY
GLUCOSE-CAPILLARY: 118 mg/dL — AB (ref 65–99)
GLUCOSE-CAPILLARY: 133 mg/dL — AB (ref 65–99)
GLUCOSE-CAPILLARY: 86 mg/dL (ref 65–99)
Glucose-Capillary: 107 mg/dL — ABNORMAL HIGH (ref 65–99)
Glucose-Capillary: 109 mg/dL — ABNORMAL HIGH (ref 65–99)
Glucose-Capillary: 178 mg/dL — ABNORMAL HIGH (ref 65–99)
Glucose-Capillary: 93 mg/dL (ref 65–99)
Glucose-Capillary: 97 mg/dL (ref 65–99)

## 2016-03-15 MED ORDER — FAMOTIDINE IN NACL 20-0.9 MG/50ML-% IV SOLN
20.0000 mg | INTRAVENOUS | Status: DC
  Administered 2016-03-15 – 2016-03-19 (×3): 20 mg via INTRAVENOUS
  Filled 2016-03-15 (×4): qty 50

## 2016-03-15 MED ORDER — PENICILLIN G POTASSIUM 5000000 UNITS IJ SOLR
500000.0000 [IU] | INTRAVENOUS | Status: DC | PRN
  Administered 2016-03-17 – 2016-03-19 (×3): 500000 [IU] via INTRAVENOUS
  Filled 2016-03-15 (×6): qty 0.5

## 2016-03-15 MED ORDER — PENICILLIN G POTASSIUM 5000000 UNITS IJ SOLR
2.0000 10*6.[IU] | Freq: Four times a day (QID) | INTRAVENOUS | Status: DC
  Administered 2016-03-15 – 2016-03-20 (×19): 2 10*6.[IU] via INTRAVENOUS
  Filled 2016-03-15 (×22): qty 2

## 2016-03-15 MED ORDER — MAGNESIUM SULFATE 2 GM/50ML IV SOLN
2.0000 g | Freq: Once | INTRAVENOUS | Status: AC
  Administered 2016-03-15: 2 g via INTRAVENOUS
  Filled 2016-03-15: qty 50

## 2016-03-15 MED ORDER — VASOPRESSIN 20 UNIT/ML IV SOLN
0.0300 [IU]/min | INTRAVENOUS | Status: DC
  Filled 2016-03-15: qty 2

## 2016-03-15 MED ORDER — TRACE MINERALS CR-CU-MN-SE-ZN 10-1000-500-60 MCG/ML IV SOLN
INTRAVENOUS | Status: AC
  Administered 2016-03-15: 20:00:00 via INTRAVENOUS
  Filled 2016-03-15: qty 960

## 2016-03-15 NOTE — Progress Notes (Signed)
PULMONARY / CRITICAL CARE MEDICINE   Name: John Schultz MRN: [id number] DOB: 04-24-1941    ADMISSION DATE:  03/21/2016  BRIEF HISTORY: 75 year old male past medical history of hypertension, chronic diastolic heart failure, CK, diabetes, presented on April 2 with four-day history of progressive right knee pain fever, shortness of breath, cough. Within 24 hours decompensated requiring intubation on 04/03.  Seen by orthopedics, had drain placed in right knee with purulent material. Now with mechanical ventilation and unable to weaned off after failed extubation 04/13 2/2  stridor and poor cough.   SUBJECTIVE:  No acute issues overnight.On precedex.  Remains on full vent support and CRRT. Able to wean of pressors today, IR attempted, but could not place PEG due to abnormal anatomy.  Patient more awake today  SIGNIFICANT EVENTS/STUDIES: 04/02 admitted by Hospitalist Service with 4 day history of right knee pain - swollen, erythematous and tender on exam - and severe sepsis, AKI, abnormal EKG. Cardiac markers positive (peak troponin I 8.10) 04/03 Orthopedics consultation and aspiration of R Knee, with drain placement 04/03 ID consultation 04/03 Nephrology consultation 04/04 TTE: LVEF 60-65%, no vegetations 04/04 altered mental status, worsening respiratory status, septic, intubated 04/04 R knee hemovac removed by ortho 04/06 Cardiology consultation: NSTEMI - deemed secondary to demand ischemia. Further W/U to be considered after resolution of severe sepsis and critical illness 04/09 CRRT initiated  04/10 agitated on WUA. Failed SBT. Off and on norepienphrine 04/11 Tolerating volume removal by CRRT. Failed SBT. Tolerated PS 10-14 cm H2O 04/12 Failed SBT. Agitated on WUA. Uo improving. Started back on fentanyl gtt in PM 04/13 LE venous US:  04/13 Passed SBT. Cognition remains marginal. Extubated. Post extubation stridor and poor cough. BiPAP initiated. High risk of re-intubation 04/13  Re-intubated in afternoon. ENT consult for trach tube placement - planned 04/17.  04/13 CT head: diffuse atrophy, no acute disease 04/14 CRRT resumed due to hypervolemia 4/17 trach by ENT 4/18 started back on levophed 4/19 PEG by GI attempted, unsuccessful  INDWELLING DEVICES:: R IJ CVL 04/03 >> 4/18 R arm PICC 4/18>> ETT 04/03 >> 04/13, 04/13 >>  R femoral HD cath 04/09 >>   MICRO DATA: 04/02 MRSA PCR >> NEG 04/02 flu screen >> NEG 04/02 blood >> Strep pyogenes 04/02 knee aspirate >> Strep pyogenes 04/05 blood >> NEG 04/11 C diff >> NEG 04/12 Resp >> NOF 04/13 Resp >> normal respiratory flora 04/18 R knee aspirate>>  PCT algorithm 04/03: 22.50 > 16.65 > 7.12 PCT algorithm 04/12: 0.62 > 0.57 > 2.66  ANTIMICROBIALS:  Ceftriaxone 04/02 X 1  Vanc 04/02 >> 04/03 Pip-tazo 04/02 >> 04/03 Clinda 04/03 >> 04/07 Pen G 04/03 >>    VITAL SIGNS: Temp:  [97.1 F (36.2 C)-99.9 F (37.7 C)] 99.9 F (37.7 C) (04/20 1347) Pulse Rate:  [27-90] 81 (04/20 1445) Resp:  [16-33] 23 (04/20 1445) BP: (89-136)/(43-82) 101/52 mmHg (04/20 1445) SpO2:  [97 %-100 %] 100 % (04/20 1445) FiO2 (%):  [28 %] 28 % (04/20 1252) Weight:  [206 lb 12.7 oz (93.8 kg)] 206 lb 12.7 oz (93.8 kg) (04/20 1347) HEMODYNAMICS:   VENTILATOR SETTINGS: Vent Mode:  [-] PRVC FiO2 (%):  [28 %] 28 % Set Rate:  [16 bmp] 16 bmp Vt Set:  [500 mL] 500 mL PEEP:  [5 cmH20] 5 cmH20 Plateau Pressure:  [18 cmH20] 18 cmH20 INTAKE / OUTPUT:  Intake/Output Summary (Last 24 hours) at 03/15/16 1503 Last data filed at 03/15/16 1400  Gross per 24 hour  Intake  1393.73 ml  Output   2822 ml  Net -1428.27 ml    Review of Systems  Unable to perform ROS: intubated    Physical Exam  Constitutional:  RASS -3, not F/C  HENT:  Head: Normocephalic and atraumatic.  Eyes: EOM are normal. Pupils are equal, round, and reactive to light.  Cardiovascular: Regular rhythm and normal heart sounds.   No murmur heard. Pulmonary/Chest:  No respiratory distress. He has no wheezes. He has no rales.  Abdominal: Soft. Bowel sounds are normal. He exhibits no distension. There is no tenderness.  Musculoskeletal:  R>L LE edema  Neurological: He has normal reflexes.  Moves all extremities     LABS:  CBC  Recent Labs Lab 03/19/2016 0549 03/09/2016 1020 03/15/16 0341  WBC 7.8 4.9 3.7*  HGB 9.3* 8.2* 8.1*  HCT 27.3* 24.8* 24.6*  PLT 69* 71* 65*   Coag's No results for input(s): APTT, INR in the last 168 hours. BMET  Recent Labs Lab 02/28/2016 1813 03/04/2016 2236 03/15/16 0340  NA 140 140 140  K 4.5 4.1 4.3  CL 106 107 108  CO2 24 27 29   BUN 13 11 10   CREATININE 0.84 0.69 0.66  GLUCOSE 148* 111* 135*   Electrolytes  Recent Labs Lab 03/03/2016 0424  03/03/2016 1813 03/11/2016 2236 03/06/2016 2237 03/15/16 0340 03/15/16 0341  CALCIUM 8.1*  < > 8.2* 8.4*  --  8.3*  --   MG 1.7  --   --   --  1.7  --  1.6*  PHOS 2.2*  < > 3.6 2.9  --  2.9  --   < > = values in this interval not displayed. Sepsis Markers  Recent Labs Lab 03/09/16 0419 03/10/16 0426  PROCALCITON 2.66 2.35   ABG  Recent Labs Lab 03/11/16 0457 03/11/2016 0500 03/13/16 0800  PHART 7.50* 7.50* 7.41  PCO2ART 36 35 38  PO2ART 104 129* 89   Liver Enzymes  Recent Labs Lab 03/10/16 0426  03/13/2016 1813 03/21/2016 2236 03/15/16 0340  AST 39  --   --   --   --   ALT 21  --   --   --   --   ALKPHOS 93  --   --   --   --   BILITOT 0.6  --   --   --   --   ALBUMIN 1.4*  < > 2.0* 1.9* 1.9*  < > = values in this interval not displayed. Cardiac Enzymes  Recent Labs Lab 03/09/16 0419  TROPONINI 1.05*   Glucose  Recent Labs Lab 03/13/2016 2000 03/03/2016 2236 03/15/16 0045 03/15/16 0434 03/15/16 0726 03/15/16 1149  GLUCAP 133* 101* 86 97 118* 109*    CXR: images from 4/16  Continued bilateral airspace opacities with persistent perihilar and lower lobe airspace disease   DISCUSSION: 75 year old male with septic right knee, developed  sepsis/renal failure requiring CRRT, also requiring intubation, now status post tracheostomy on 02/29/2016. Delirium/agitation, and poor urine output, along with inability to completely wean off ventilator are limiting factors to his overall clinical improvement.  ASSESSMENT / PLAN:  PULMONARY Acute vent dep respiratory failure - s/p trach Pulm edema pattern on CXR Failed extubation 04/13 S/P trach 4/17 by ENT P: Cont vent support - settings reviewed and/or adjusted Cont vent bundle Daily SBT and sedation vacation;  S/p trach 4/17 Becoming more awake during [place], still with tachypnea and agitation on SBT  CARDIOVASCULAR Septic shock, resolved NSTEMI PAF P:  MAP goal >  60 mmHg PRN phenylephrine/vasopressin Cont ASA and statin Further cardiac w/u after critical illness resolves per cardiology  RENAL AKI Severe hypervolemia P: Monitor BMET intermittently Monitor I/Os Correct electrolytes as indicated Renal Service following CRRT  resumed 04/14; continue per nephrology Nephrology considering a trial of HD on 4/20  GASTROINTESTINAL Diarrhea  Hemoccult positive  FEN P: SUP: enteral famotidine Cont TFs Monitor for bleeding GI attempted PEG on 4/19, unsuccessful IR attempted PEG on 4/19, unable to perform due to abnormal anatomy Surgery consulted for PEG eval Fluoro consult for NGT placement.  TPN today  HEMATOLOGIC Anemia without overt bleeding Thrombocytopenia  P: DVT px: SCDs (SQ heparin DC'd 04/12) Monitor CBC intermittently Transfuse per usual guidelines  INFECTIOUS Septic arthritis of the R knee Purulent ET secretions - much improved, now with Trach Small increase in PCT 04/14 - unclear significance P: Monitor temp, WBC count Micro and abx as above ID service following -  RIJ removed, R arm PICC placed.    ENDOCRINE DM 2, controlled Hyperglycemia improved P: Cont Lantus Cont SSI  NEUROLOGIC ICU acquired delirium - hypoactive Profound  deconditioning P: RASS goal: 0, -1 Precedex initiated 04/14; continue to titrate to RASS goal Monitor for bradycardia PRN fentanyl Willl need extensive rehab if he survives this acute phase of illness  Family - wife at bedside, updated on clinical status, she is in agreement with PEG placement today and current plan of care.   I have personally obtained a history, examined the patient, evaluated Pertinent laboratory and [employer]/imaging results, and  formulated the assessment and plan   The Patient requires high complexity decision making for assessment and support, frequent evaluation and titration of therapies, application of advanced monitoring technologies and extensive interpretation of multiple databases. Critical Care Time devoted to patient care services described in this note is 40 minutes.   Overall, patient is critically ill, prognosis is guarded.  Patient with Multiorgan failure and at high risk for cardiac arrest and death.   Vilinda Boehringer, MD [facility] Pager [phone] (please enter 7-digits) On Call Pager - [phone] (please enter 7-digits)

## 2016-03-15 NOTE — Progress Notes (Signed)
TX started 

## 2016-03-15 NOTE — Progress Notes (Signed)
[place] NOTE  Pharmacy Consult for CRRT Medication Review   Allergies  Allergen Reactions  . Tape Rash    Patient Measurements: Height: 6' (182.9 cm) Weight: 206 lb 12.7 oz (93.8 kg) IBW/kg (Calculated) : 77.6  Vital Signs: Temp: 99.9 F (37.7 C) (04/20 1347) Temp Source: Axillary (04/20 1347) BP: 94/52 mmHg (04/20 1400) Pulse Rate: 75 (04/20 1400) Intake/Output from previous day: 04/19 0701 - 04/20 0700 In: 1346.6 [I.V.:433.3; IV Piggyback:913.3] Out: 2694 [Urine:550] Intake/Output from this shift: Total I/O In: 93 [I.V.:93] Out: 421 [Other:421] Vent settings for last 24 hours: Vent Mode:  [-] PRVC FiO2 (%):  [28 %] 28 % Set Rate:  [16 bmp] 16 bmp Vt Set:  [500 mL] 500 mL PEEP:  [5 cmH20] 5 cmH20 Plateau Pressure:  [18 cmH20] 18 cmH20  Labs:  Recent Labs  03/01/2016 0424  03/18/2016 1020 03/01/2016 1813 03/24/2016 2236 03/09/2016 2237 03/15/16 0340 03/15/16 0341  WBC  --   --  4.9  --   --   --   --  3.7*  HGB  --   --  8.2*  --   --   --   --  8.1*  HCT  --   --  24.8*  --   --   --   --  24.6*  PLT  --   --  71*  --   --   --   --  65*  CREATININE 0.64  < >  --  0.84 0.69  --  0.66  --   MG 1.7  --   --   --   --  1.7  --  1.6*  PHOS 2.2*  < >  --  3.6 2.9  --  2.9  --   ALBUMIN 2.0*  < >  --  2.0* 1.9*  --  1.9*  --   < > = values in this interval not displayed. Estimated Creatinine Clearance: 97.8 mL/min (by C-G formula based on Cr of 0.66).   Recent Labs  03/15/16 0434 03/15/16 0726 03/15/16 1149  GLUCAP 97 118* 109*    Microbiology: Recent Results (from the past 720 hour(s))  Blood culture (routine x 2)     Status: Abnormal   Collection Time: 03/19/2016 12:59 PM  Result Value Ref Range Status   Specimen Description BLOOD LEFT FOREARM  Final   Special Requests BOTTLES DRAWN AEROBIC AND ANAEROBIC  1CC  Final   Culture  Setup Time   Final    GRAM POSITIVE COCCI AEROBIC BOTTLE ONLY CRITICAL VALUE NOTED.  VALUE IS CONSISTENT  WITH PREVIOUSLY REPORTED AND CALLED VALUE.    Culture STREPTOCOCCUS PYOGENES AEROBIC BOTTLE ONLY  (A)  Final   Report Status 03/03/2016 FINAL  Final   Organism ID, Bacteria STREPTOCOCCUS PYOGENES  Final      Susceptibility   Streptococcus pyogenes - MIC*    CLINDAMYCIN Value in next row Sensitive      SENSITIVE0.25    AMPICILLIN Value in next row Sensitive      SENSITIVE0.25    ERYTHROMYCIN Value in next row Sensitive      SENSITIVE0.12    VANCOMYCIN Value in next row Sensitive      SENSITIVE0.5    CEFTRIAXONE Value in next row Sensitive      SENSITIVE0.12    LEVOFLOXACIN Value in next row Sensitive      SENSITIVE0.5    * STREPTOCOCCUS PYOGENES  Rapid Influenza A&B Antigens ([facility] only)  Status: None   Collection Time: 03/16/2016  1:00 PM  Result Value Ref Range Status   Influenza A ([place]) NEGATIVE NEGATIVE Final   Influenza B ([facility]) NEGATIVE NEGATIVE Final  Blood culture (routine x 2)     Status: None   Collection Time: 03/04/2016  1:40 PM  Result Value Ref Range Status   Specimen Description BLOOD LEFT FOREARM  Final   Special Requests BOTTLES DRAWN AEROBIC AND ANAEROBIC  4CC  Final   Culture  Setup Time   Final    GRAM POSITIVE COCCI IN BOTH AEROBIC AND ANAEROBIC BOTTLES CRITICAL RESULT CALLED TO, READ BACK BY AND VERIFIED WITH: [place] AT 8003 02/27/16 CAF    Culture   Final    STREPTOCOCCUS PYOGENES IN BOTH AEROBIC AND ANAEROBIC BOTTLES    Report Status 02/29/2016 FINAL  Final   Organism ID, Bacteria STREPTOCOCCUS PYOGENES  Final      Susceptibility   Streptococcus pyogenes - MIC*    CLINDAMYCIN Value in next row Sensitive      SENSITIVE<=0.25    AMPICILLIN Value in next row Sensitive      SENSITIVE<=0.25    ERYTHROMYCIN Value in next row Sensitive      SENSITIVE0.12    VANCOMYCIN Value in next row Sensitive      SENSITIVE0.5    CEFTRIAXONE Value in next row Sensitive      SENSITIVE0.12    LEVOFLOXACIN Value in next row Sensitive      SENSITIVE0.5    *  STREPTOCOCCUS PYOGENES  Blood Culture ID Panel (Reflexed)     Status: Abnormal   Collection Time: 02/28/2016  1:40 PM  Result Value Ref Range Status   Enterococcus species NOT DETECTED NOT DETECTED Final   Vancomycin resistance NOT DETECTED NOT DETECTED Final   Listeria monocytogenes NOT DETECTED NOT DETECTED Final   Staphylococcus species NOT DETECTED NOT DETECTED Final   Staphylococcus aureus NOT DETECTED NOT DETECTED Final   Methicillin resistance NOT DETECTED NOT DETECTED Final   Streptococcus species NOT DETECTED NOT DETECTED Final   Streptococcus agalactiae NOT DETECTED NOT DETECTED Final   Streptococcus pneumoniae NOT DETECTED NOT DETECTED Final   Streptococcus pyogenes DETECTED (A) NOT DETECTED Final    Comment: CRITICAL RESULT CALLED TO, READ BACK BY AND VERIFIED WITH: NATE COOKSON AT 0426 02/27/16 CAF    Acinetobacter baumannii NOT DETECTED NOT DETECTED Final   Enterobacteriaceae species NOT DETECTED NOT DETECTED Final   Enterobacter cloacae complex NOT DETECTED NOT DETECTED Final   Escherichia coli NOT DETECTED NOT DETECTED Final   Klebsiella oxytoca NOT DETECTED NOT DETECTED Final   Klebsiella pneumoniae NOT DETECTED NOT DETECTED Final   Proteus species NOT DETECTED NOT DETECTED Final   Serratia marcescens NOT DETECTED NOT DETECTED Final   Carbapenem resistance NOT DETECTED NOT DETECTED Final   Haemophilus influenzae NOT DETECTED NOT DETECTED Final   Neisseria meningitidis NOT DETECTED NOT DETECTED Final   Pseudomonas aeruginosa NOT DETECTED NOT DETECTED Final   Candida albicans NOT DETECTED NOT DETECTED Final   Candida glabrata NOT DETECTED NOT DETECTED Final   Candida krusei NOT DETECTED NOT DETECTED Final   Candida parapsilosis NOT DETECTED NOT DETECTED Final   Candida tropicalis NOT DETECTED NOT DETECTED Final  Body fluid culture     Status: None   Collection Time: 03/21/2016  3:05 PM  Result Value Ref Range Status   Specimen Description R Knee  Final   Special  Requests NONE  Final   Gram Stain   Final  MODERATE WBC SEEN MODERATE GRAM POSITIVE COCCI IN PAIRS AND CHAINS    Culture   Final    HEAVY GROWTH GROUP A STREP (S.PYOGENES) ISOLATED There is no known Penicillin Resistant Beta Streptococcus in the [place] For patients that are Penicillin-allergic, Erythromycin is 85-94% susceptible, and Clindamycin is 80% susceptible.  Contact Microbiology within 7 days if sensitivity testing is  required.   NO ANAEROBES ISOLATED    Report Status 02/29/2016 FINAL  Final  Body fluid culture     Status: None   Collection Time: 03/12/2016  4:10 PM  Result Value Ref Range Status   Specimen Description R Knee  Final   Special Requests NONE  Final   Gram Stain   Final    MANY WBC SEEN MANY GRAM POSITIVE COCCI IN PAIRS AND CHAINS    Culture   Final    HEAVY GROWTH GROUP A STREP (S.PYOGENES) ISOLATED There is no known Penicillin Resistant Beta Streptococcus in the [place] For patients that are Penicillin-allergic, Erythromycin is 85-94% susceptible, and Clindamycin is 80% susceptible.  Contact Microbiology within 7 days if sensitivity testing is  required.   NO ANAEROBES ISOLATED    Report Status 02/29/2016 FINAL  Final  MRSA PCR Screening     Status: None   Collection Time: 02/27/16  3:35 AM  Result Value Ref Range Status   MRSA by PCR NEGATIVE NEGATIVE Final    Comment:        The GeneXpert MRSA Assay (FDA approved for NASAL specimens only), is one component of a comprehensive MRSA colonization surveillance program. It is not intended to diagnose MRSA infection nor to guide or monitor treatment for MRSA infections.   Urine culture     Status: None   Collection Time: 02/27/16  8:30 AM  Result Value Ref Range Status   Specimen Description URINE, RANDOM  Final   Special Requests NONE  Final   Culture NO GROWTH 1 DAY  Final   Report Status 02/28/2016 FINAL  Final  Culture, blood (single) w Reflex to PCR ID Panel     Status: None   Collection Time:  02/29/16  2:36 PM  Result Value Ref Range Status   Specimen Description BLOOD LEFT HAND  Final   Special Requests BOTTLES DRAWN AEROBIC AND ANAEROBIC [place]  Final   Culture NO GROWTH 5 DAYS  Final   Report Status 03/05/2016 FINAL  Final  C difficile quick scan w PCR reflex     Status: None   Collection Time: 03/06/16  5:37 PM  Result Value Ref Range Status   C Diff antigen NEGATIVE NEGATIVE Final   C Diff toxin NEGATIVE NEGATIVE Final   C Diff interpretation Negative for C. difficile  Final  Culture, respiratory (NON-Expectorated)     Status: None   Collection Time: 03/07/16  8:20 AM  Result Value Ref Range Status   Specimen Description TRACHEAL ASPIRATE  Final   Special Requests NONE  Final   Gram Stain   Final    MODERATE WBC SEEN FEW SQUAMOUS EPITHELIAL CELLS PRESENT RARE YEAST    Culture Consistent with normal respiratory flora.  Final   Report Status 03/09/2016 FINAL  Final  Culture, respiratory (NON-Expectorated)     Status: None   Collection Time: 03/08/16  4:52 PM  Result Value Ref Range Status   Specimen Description TRACHEAL ASPIRATE  Final   Special Requests NONE  Final   Gram Stain NO ORGANISMS SEEN  Final   Culture Consistent with normal respiratory  flora.  Final   Report Status 03/10/2016 FINAL  Final  Body fluid culture     Status: None (Preliminary result)   Collection Time: 03/13/16  3:49 PM  Result Value Ref Range Status   Specimen Description SYNOVIAL  Final   Special Requests Immunocompromised  Final   Gram Stain MANY WBC SEEN NO ORGANISMS SEEN   Final   Culture NO GROWTH 2 DAYS  Final   Report Status PENDING  Incomplete  CULTURE, BLOOD (ROUTINE X 2) w Reflex to PCR ID Panel     Status: None (Preliminary result)   Collection Time: 03/15/2016 11:55 AM  Result Value Ref Range Status   Specimen Description BLOOD LEFT HAND  Final   Special Requests   Final    BOTTLES DRAWN AEROBIC AND ANAEROBIC  AERO 13CC ANA 5CC   Culture NO GROWTH < 24 HOURS   Final   Report Status PENDING  Incomplete  CULTURE, BLOOD (ROUTINE X 2) w Reflex to PCR ID Panel     Status: None (Preliminary result)   Collection Time: 02/28/2016 11:55 AM  Result Value Ref Range Status   Specimen Description BLOOD RIGHT HAND  Final   Special Requests BOTTLES DRAWN AEROBIC AND ANAEROBIC  [place]  Final   Culture NO GROWTH < 24 HOURS  Final   Report Status PENDING  Incomplete    Medications:  Scheduled:  . antiseptic oral rinse  7 mL Mouth Rinse QID  . aspirin  325 mg Per Tube Daily  . budesonide (PULMICORT) nebulizer solution  0.25 mg Nebulization BID  . chlorhexidine gluconate (SAGE KIT)  15 mL Mouth Rinse BID  . famotidine  20 mg Per Tube Daily  . insulin aspart  0-15 Units Subcutaneous 6 times per day  . ipratropium-albuterol  3 mL Nebulization Q6H  . magnesium sulfate 1 - 4 g bolus IVPB  2 g Intravenous Once  . pencillin G potassium IV  3 Million Units Intravenous 4 times per day  . pravastatin  40 mg Per Tube q1800  . sodium chloride flush  10-40 mL Intracatheter Q12H   Infusions:  . Marland KitchenTPN (CLINIMIX-E) Adult    . alteplase    . dexmedetomidine 0.4 mcg/kg/hr (03/15/16 0805)  . phenylephrine (NEO-SYNEPHRINE) Adult infusion Stopped (03/15/16 1258)  . pureflow 3 each (03/15/16 0752)  . vasopressin (PITRESSIN) infusion - *FOR SHOCK* Stopped (03/15/16 1230)    Assessment: 75 y/o M admitted with septic shock now requiring CRRT. Pharmacy consulted to adjust medication dosing for CRRT.   Plan:  Patient transitioning off CRRT to IHD. Will change penicillin G dosing to 2 million units q 6 hours with supplemental 500000 units after HD on HD days. Will change famotidine to 20 mg iv q 48 hours.   Pharmacy will continue to monitor and adjust per consult.   Napoleon Form, RPh Clinical Pharmacist 03/15/2016,2:12 PM

## 2016-03-15 NOTE — Progress Notes (Signed)
PARENTERAL NUTRITION CONSULT NOTE - INITIAL  Pharmacy Consult for Electrolyte and Glucose Monitoring  Indication: TPN  Allergies  Allergen Reactions  . Tape Rash    Patient Measurements: Height: 6' (182.9 cm) Weight: 206 lb 12.7 oz (93.8 kg) IBW/kg (Calculated) : 77.6  Vital Signs: Temp: 99.9 F (37.7 C) (04/20 1347) Temp Source: Axillary (04/20 1347) BP: 94/52 mmHg (04/20 1400) Pulse Rate: 75 (04/20 1400) Intake/Output from previous day: 04/19 0701 - 04/20 0700 In: 1346.6 [I.V.:433.3; IV Piggyback:913.3] Out: 2694 [Urine:550] Intake/Output from this shift: Total I/O In: 93 [I.V.:93] Out: 421 [Other:421]  Labs:  Recent Labs  03/13/2016 1020 03/15/16 0341  WBC 4.9 3.7*  HGB 8.2* 8.1*  HCT 24.8* 24.6*  PLT 71* 65*     Recent Labs  03/02/2016 0424  03/02/2016 1813 03/24/2016 2236 03/10/2016 2237 03/15/16 0340 03/15/16 0341  NA 137  < > 140 140  --  140  --   K 4.5  < > 4.5 4.1  --  4.3  --   CL 106  < > 106 107  --  108  --   CO2 27  < > 24 27  --  29  --   GLUCOSE 165*  < > 148* 111*  --  135*  --   BUN 13  < > 13 11  --  10  --   CREATININE 0.64  < > 0.84 0.69  --  0.66  --   CALCIUM 8.1*  < > 8.2* 8.4*  --  8.3*  --   MG 1.7  --   --   --  1.7  --  1.6*  PHOS 2.2*  < > 3.6 2.9  --  2.9  --   ALBUMIN 2.0*  < > 2.0* 1.9*  --  1.9*  --   < > = values in this interval not displayed. Estimated Creatinine Clearance: 97.8 mL/min (by C-G formula based on Cr of 0.66).    Recent Labs  03/15/16 0434 03/15/16 0726 03/15/16 1149  GLUCAP 97 118* 109*    Medical History: Past Medical History  Diagnosis Date  . CHF (congestive heart failure) ([place])   . DM (diabetes mellitus) ([place])   . Pancreatic cyst   . Skin cancer   . DDD (degenerative disc disease), thoracic   . Renal failure   . Respiratory failure ([facility])     Medications:  Scheduled:  . antiseptic oral rinse  7 mL Mouth Rinse QID  . aspirin  325 mg Per Tube Daily  . budesonide (PULMICORT) nebulizer  solution  0.25 mg Nebulization BID  . chlorhexidine gluconate (SAGE KIT)  15 mL Mouth Rinse BID  . famotidine  20 mg Per Tube Daily  . insulin aspart  0-15 Units Subcutaneous 6 times per day  . ipratropium-albuterol  3 mL Nebulization Q6H  . magnesium sulfate 1 - 4 g bolus IVPB  2 g Intravenous Once  . pencillin G potassium IV  3 Million Units Intravenous 4 times per day  . pravastatin  40 mg Per Tube q1800  . sodium chloride flush  10-40 mL Intracatheter Q12H   Infusions:  . Marland KitchenTPN (CLINIMIX-E) Adult    . alteplase    . dexmedetomidine 0.4 mcg/kg/hr (03/15/16 0805)  . phenylephrine (NEO-SYNEPHRINE) Adult infusion Stopped (03/15/16 1258)  . pureflow 3 each (03/15/16 0752)  . vasopressin (PITRESSIN) infusion - *FOR SHOCK* Stopped (03/15/16 1230)    Insulin Requirements in the past 24 hours:    Current Nutrition:  Clinimix E 5/20 at 40 ml/hr  Assessment: Pharmacy consulted to assist in electrolyte and glucose management in this 75 y/o M ventilated and sedated now starting TPN.   Plan:  SSI ordered. Will f/u CBGs and SSI requirements.  Will replace magnesium with magnesium sulfate 2 g iv once and recheck electrolytes in AM.   Ulice Dash D 03/15/2016,2:08 PM

## 2016-03-15 NOTE — Progress Notes (Signed)
Post Hd   

## 2016-03-15 NOTE — Progress Notes (Signed)
TX ended.    

## 2016-03-15 NOTE — Progress Notes (Signed)
The surgical service was consulted for evaluation of a percutaneous endoscopic gastrostomy tube. The patient has been seen by the interventional radiology service and the gastroenterology service, both of which have declined to consider tube placement.  The patient had previous abdominal surgery extent of which is currently unavailable in the chart. The [facility] record suggests he had some sort of colorectal and pancreatic procedures in the past with a previous gastrostomy. The EGD note did demonstrate an area of stomach felt to be site of the previous gastrostomy. CT scan demonstrated an anteriorly placed colon which limited the access to the stomach with an interventional radiology procedure. Both services suggested surgical intervention.  Patient was admitted with a septic joint in his knee. He had a rapid decompensation and has required mechanical ventilation since early in April. He has recently been weaned off his pressors but continues on mechanical ventilation. Currently his albumin is 1.9 up from a low of 1.1. His platelet count is 60,000.  On exam he is nonresponsive with a tracheostomy moderate contractures. On his abdominal wall there is a midline incision extending up into a sternotomy believed to be secondary to some sort of cardiac procedure. There is a scar in the left upper quadrant likely source of his previous gastrostomy.  Independently reviewed his CT scan. There does appear to be an anterior: Lying over a portion of the stomach. The EGD didn't demonstrate the stomach appeared to be attached to the anterior abdominal wall. Perhaps the best option would be a combined endoscopic interventional radiology procedure where the stomach could be evaluated as a needle was advanced into the site of the previous gastrostomy. In any regard, the patient is a very poor surgical candidate and would likely require laparotomy for placement of a gastrostomy tube. With the previous gastrostomy tube placement  his stomach will be affixed to the anterior abdominal wall. With this previous pancreatic: Surgeries he is likely that have significant intra-abdominal adhesions which would prevent access to the stomach and mobilization of the stomach to the anterior abdominal wall. With his low albumin and platelet count he is prohibitive surgical risk. I will discuss the case with my colleagues but at the present time I do not feel he is a candidate for surgical gastrostomy.

## 2016-03-15 NOTE — Progress Notes (Signed)
PRE HD   

## 2016-03-15 NOTE — Progress Notes (Signed)
Nutrition Follow-up  DOCUMENTATION CODES:   Not applicable  INTERVENTION:  -Consult received for initiation of TPN. Recommend starting 5%AA/20%Dextrose at rate of 40 ml/hr today; goal rate to best meet nutritional needs is 90 ml/hr providing 108 g of protein, 1901 kcals, 2160 mL of fluid. Pt currently receiving trial of HD; ability to achieve goal rate of 90 ml/hr will be limited by volume status and tolerance. Will reassess on follow-up. TPN panel in AM, pharmacy consult for electrolyte and glucose management.   NUTRITION DIAGNOSIS:   Inadequate oral intake related to acute illness as evidenced by NPO status.  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, respiratory distress with severe acidosis requiring intubation on 02/27/16   Plan for trial of intermittent HD today, off CRRT, remains on neosynephrine. GI and IR unable to place PEG tube and surgery has been consulted for evaluation of feeding tube. Pt has PICC line with port available for TPN. Pt has not received TF since midnight on Sunday 03/11/16.   Patient is currently intubated on ventilator support MV: 11.5 L/min Temp (24hrs), Avg:97.8 F (36.6 C), Min:97.1 F (36.2 C), Max:98.4 F (36.9 C)  Diet Order:   NPO  Digestive System: NG unable to be placed by RN at bedside  Skin:   (stage I buttock, stage II sacral pressure ulcer)  Last BM:  03/12/2016 diarrhea, rectal tube, C.diff negative   Labs:   Glucose Profile:  Recent Labs  03/15/16 0434 03/15/16 0726 03/15/16 1149  GLUCAP 97 118* 109*   Meds: reviewed  Height:   Ht Readings from Last 1 Encounters:  02/28/16 6' (1.829 m)    Weight:   Wt Readings from Last 1 Encounters:  03/15/16 206 lb 12.7 oz (93.8 kg)    Ideal Body  Weight:  80.9 kg  BMI:  Body mass index is 28.04 kg/(m^2).  Estimated Nutritional Needs:   Kcal:  1903 kcals (Ve: 14.2, Tmax: 36.8)   Protein:  128-170 g (1.5-2.0 g/kg)   Fluid:  >1.9 L  EDUCATION NEEDS:   Education needs no appropriate at this time   [place], [place], [place] [phone] Pager  [phone] Weekend/On-Call Pager

## 2016-03-15 NOTE — Progress Notes (Signed)
Subjective:   Wife at bedside.   On phenylephrine 15  UF 2144 UOP 550  Objective:  Vital signs in last 24 hours:  Temp:  [97.1 F (36.2 C)-98.4 F (36.9 C)] 97.1 F (36.2 C) (04/20 0800) Pulse Rate:  [57-81] 62 (04/20 0800) Resp:  [16-33] 19 (04/20 0800) BP: (89-138)/(46-82) 89/50 mmHg (04/20 0800) SpO2:  [98 %-100 %] 99 % (04/20 0856) FiO2 (%):  [28 %] 28 % (04/20 0856) Weight:  [93.8 kg (206 lb 12.7 oz)] 93.8 kg (206 lb 12.7 oz) (04/20 0500)  Weight change: -6 kg (-13 lb 3.7 oz) Filed Weights   03/13/16 0500 03/05/2016 0449 03/15/16 0500  Weight: 99.2 kg (218 lb 11.1 oz) 99.8 kg (220 lb 0.3 oz) 93.8 kg (206 lb 12.7 oz)    Intake/Output:    Intake/Output Summary (Last 24 hours) at 03/15/16 1036 Last data filed at 03/15/16 0800  Gross per 24 hour  Intake 1439.64 ml  Output   2401 ml  Net -961.36 ml     Physical Exam: General: Critically ill appearing   HEENT ETT  Neck Trachea midling  Pulm/lungs Vent assisted, bilateral rhonchi, PRVC FiO2 28%  CVS/Heart tachycardic, soft systolic murmur  Abdomen:  Soft, non distended, BS present  Extremities: ++ peripheral edema,    Neurologic: Sedated, intubated  Skin: Heel skin breakdown  GU: Foley  Access:  Right femoral Vas-Cath/Dr. Lucky Cowboy 4/9    Basic Metabolic Panel:   Recent Labs Lab 03/13/16 0454  03/13/16 2136 03/01/2016 0424 03/23/2016 0981 02/29/2016 1813 03/13/2016 2236 03/06/2016 2237 03/15/16 0340 03/15/16 0341  NA 139  < > 140 137 139 140 140  --  140  --   K 4.6  < > 4.4 4.5 4.1 4.5 4.1  --  4.3  --   CL 109  < > 107 106 107 106 107  --  108  --   CO2 25  < > 26 27 27 24 27   --  29  --   GLUCOSE 128*  < > 185* 165* 173* 148* 111*  --  135*  --   BUN 20  < > 16 13 12 13 11   --  10  --   CREATININE 0.59*  < > 0.63 0.64 0.63 0.84 0.69  --  0.66  --   CALCIUM 8.3*  < > 8.3* 8.1* 8.3* 8.2* 8.4*  --  8.3*  --   MG 1.7  --  1.6* 1.7  --   --   --  1.7  --  1.6*  PHOS 2.4*  < > 2.3* 2.2* 1.8* 3.6 2.9  --  2.9   --   < > = values in this interval not displayed.   CBC:  Recent Labs Lab 03/11/16 0348 03/11/16 2155 03/08/2016 0549 03/16/2016 1020 03/15/16 0341  WBC 8.2 6.3 7.8 4.9 3.7*  HGB 7.3* 6.6* 9.3* 8.2* 8.1*  HCT 22.0* 20.4* 27.3* 24.8* 24.6*  MCV 93.2 92.7 92.2 91.5 92.2  PLT 73* 66* 69* 71* 65*      Microbiology:  Recent Results (from the past 720 hour(s))  Blood culture (routine x 2)     Status: Abnormal   Collection Time: 03/16/2016 12:59 PM  Result Value Ref Range Status   Specimen Description BLOOD LEFT FOREARM  Final   Special Requests BOTTLES DRAWN AEROBIC AND ANAEROBIC  1CC  Final   Culture  Setup Time   Final    GRAM POSITIVE COCCI AEROBIC BOTTLE ONLY CRITICAL VALUE NOTED.  VALUE IS CONSISTENT WITH PREVIOUSLY REPORTED AND CALLED VALUE.    Culture STREPTOCOCCUS PYOGENES AEROBIC BOTTLE ONLY  (A)  Final   Report Status 03/03/2016 FINAL  Final   Organism ID, Bacteria STREPTOCOCCUS PYOGENES  Final      Susceptibility   Streptococcus pyogenes - MIC*    CLINDAMYCIN Value in next row Sensitive      SENSITIVE0.25    AMPICILLIN Value in next row Sensitive      SENSITIVE0.25    ERYTHROMYCIN Value in next row Sensitive      SENSITIVE0.12    VANCOMYCIN Value in next row Sensitive      SENSITIVE0.5    CEFTRIAXONE Value in next row Sensitive      SENSITIVE0.12    LEVOFLOXACIN Value in next row Sensitive      SENSITIVE0.5    * STREPTOCOCCUS PYOGENES  Rapid Influenza A&B Antigens ([facility] only)     Status: None   Collection Time: 03/19/2016  1:00 PM  Result Value Ref Range Status   Influenza A ([facility]) NEGATIVE NEGATIVE Final   Influenza B ([facility]) NEGATIVE NEGATIVE Final  Blood culture (routine x 2)     Status: None   Collection Time: 03/24/2016  1:40 PM  Result Value Ref Range Status   Specimen Description BLOOD LEFT FOREARM  Final   Special Requests BOTTLES DRAWN AEROBIC AND ANAEROBIC  4CC  Final   Culture  Setup Time   Final    GRAM POSITIVE COCCI IN BOTH AEROBIC AND  ANAEROBIC BOTTLES CRITICAL RESULT CALLED TO, READ BACK BY AND VERIFIED WITH: NATE COOKSON AT 0426 02/27/16 CAF    Culture   Final    STREPTOCOCCUS PYOGENES IN BOTH AEROBIC AND ANAEROBIC BOTTLES    Report Status 02/29/2016 FINAL  Final   Organism ID, Bacteria STREPTOCOCCUS PYOGENES  Final      Susceptibility   Streptococcus pyogenes - MIC*    CLINDAMYCIN Value in next row Sensitive      SENSITIVE<=0.25    AMPICILLIN Value in next row Sensitive      SENSITIVE<=0.25    ERYTHROMYCIN Value in next row Sensitive      SENSITIVE0.12    VANCOMYCIN Value in next row Sensitive      SENSITIVE0.5    CEFTRIAXONE Value in next row Sensitive      SENSITIVE0.12    LEVOFLOXACIN Value in next row Sensitive      SENSITIVE0.5    * STREPTOCOCCUS PYOGENES  Blood Culture ID Panel (Reflexed)     Status: Abnormal   Collection Time: 03/06/2016  1:40 PM  Result Value Ref Range Status   Enterococcus species NOT DETECTED NOT DETECTED Final   Vancomycin resistance NOT DETECTED NOT DETECTED Final   Listeria monocytogenes NOT DETECTED NOT DETECTED Final   Staphylococcus species NOT DETECTED NOT DETECTED Final   Staphylococcus aureus NOT DETECTED NOT DETECTED Final   Methicillin resistance NOT DETECTED NOT DETECTED Final   Streptococcus species NOT DETECTED NOT DETECTED Final   Streptococcus agalactiae NOT DETECTED NOT DETECTED Final   Streptococcus pneumoniae NOT DETECTED NOT DETECTED Final   Streptococcus pyogenes DETECTED (A) NOT DETECTED Final    Comment: CRITICAL RESULT CALLED TO, READ BACK BY AND VERIFIED WITH: NATE COOKSON AT 0426 02/27/16 CAF    Acinetobacter baumannii NOT DETECTED NOT DETECTED Final   Enterobacteriaceae species NOT DETECTED NOT DETECTED Final   Enterobacter cloacae complex NOT DETECTED NOT DETECTED Final   Escherichia coli NOT DETECTED NOT DETECTED Final   Klebsiella oxytoca NOT DETECTED  NOT DETECTED Final   Klebsiella pneumoniae NOT DETECTED NOT DETECTED Final   Proteus species NOT  DETECTED NOT DETECTED Final   Serratia marcescens NOT DETECTED NOT DETECTED Final   Carbapenem resistance NOT DETECTED NOT DETECTED Final   Haemophilus influenzae NOT DETECTED NOT DETECTED Final   Neisseria meningitidis NOT DETECTED NOT DETECTED Final   Pseudomonas aeruginosa NOT DETECTED NOT DETECTED Final   Candida albicans NOT DETECTED NOT DETECTED Final   Candida glabrata NOT DETECTED NOT DETECTED Final   Candida krusei NOT DETECTED NOT DETECTED Final   Candida parapsilosis NOT DETECTED NOT DETECTED Final   Candida tropicalis NOT DETECTED NOT DETECTED Final  Body fluid culture     Status: None   Collection Time: 02/27/2016  3:05 PM  Result Value Ref Range Status   Specimen Description R Knee  Final   Special Requests NONE  Final   Gram Stain   Final    MODERATE WBC SEEN MODERATE GRAM POSITIVE COCCI IN PAIRS AND CHAINS    Culture   Final    HEAVY GROWTH GROUP A STREP (S.PYOGENES) ISOLATED There is no known Penicillin Resistant Beta Streptococcus in the [place] For patients that are Penicillin-allergic, Erythromycin is 85-94% susceptible, and Clindamycin is 80% susceptible.  Contact Microbiology within 7 days if sensitivity testing is  required.   NO ANAEROBES ISOLATED    Report Status 02/29/2016 FINAL  Final  Body fluid culture     Status: None   Collection Time: 03/13/2016  4:10 PM  Result Value Ref Range Status   Specimen Description R Knee  Final   Special Requests NONE  Final   Gram Stain   Final    MANY WBC SEEN MANY GRAM POSITIVE COCCI IN PAIRS AND CHAINS    Culture   Final    HEAVY GROWTH GROUP A STREP (S.PYOGENES) ISOLATED There is no known Penicillin Resistant Beta Streptococcus in the [place] For patients that are Penicillin-allergic, Erythromycin is 85-94% susceptible, and Clindamycin is 80% susceptible.  Contact Microbiology within 7 days if sensitivity testing is  required.   NO ANAEROBES ISOLATED    Report Status 02/29/2016 FINAL  Final  MRSA PCR Screening      Status: None   Collection Time: 02/27/16  3:35 AM  Result Value Ref Range Status   MRSA by PCR NEGATIVE NEGATIVE Final    Comment:        The GeneXpert MRSA Assay (FDA approved for NASAL specimens only), is one component of a comprehensive MRSA colonization surveillance program. It is not intended to diagnose MRSA infection nor to guide or monitor treatment for MRSA infections.   Urine culture     Status: None   Collection Time: 02/27/16  8:30 AM  Result Value Ref Range Status   Specimen Description URINE, RANDOM  Final   Special Requests NONE  Final   Culture NO GROWTH 1 DAY  Final   Report Status 02/28/2016 FINAL  Final  Culture, blood (single) w Reflex to PCR ID Panel     Status: None   Collection Time: 02/29/16  2:36 PM  Result Value Ref Range Status   Specimen Description BLOOD LEFT HAND  Final   Special Requests BOTTLES DRAWN AEROBIC AND ANAEROBIC [place]  Final   Culture NO GROWTH 5 DAYS  Final   Report Status 03/05/2016 FINAL  Final  C difficile quick scan w PCR reflex     Status: None   Collection Time: 03/06/16  5:37 PM  Result Value Ref Range  Status   C Diff antigen NEGATIVE NEGATIVE Final   C Diff toxin NEGATIVE NEGATIVE Final   C Diff interpretation Negative for C. difficile  Final  Culture, respiratory (NON-Expectorated)     Status: None   Collection Time: 03/07/16  8:20 AM  Result Value Ref Range Status   Specimen Description TRACHEAL ASPIRATE  Final   Special Requests NONE  Final   Gram Stain   Final    MODERATE WBC SEEN FEW SQUAMOUS EPITHELIAL CELLS PRESENT RARE YEAST    Culture Consistent with normal respiratory flora.  Final   Report Status 03/09/2016 FINAL  Final  Culture, respiratory (NON-Expectorated)     Status: None   Collection Time: 03/08/16  4:52 PM  Result Value Ref Range Status   Specimen Description TRACHEAL ASPIRATE  Final   Special Requests NONE  Final   Gram Stain NO ORGANISMS SEEN  Final   Culture Consistent with normal  respiratory flora.  Final   Report Status 03/10/2016 FINAL  Final  Body fluid culture     Status: None (Preliminary result)   Collection Time: 03/13/16  3:49 PM  Result Value Ref Range Status   Specimen Description SYNOVIAL  Final   Special Requests Immunocompromised  Final   Gram Stain PENDING  Incomplete   Culture NO GROWTH 2 DAYS  Final   Report Status PENDING  Incomplete    Coagulation Studies: No results for input(s): LABPROT, INR in the last 72 hours.  Urinalysis: No results for input(s): COLORURINE, LABSPEC, PHURINE, GLUCOSEU, HGBUR, BILIRUBINUR, KETONESUR, PROTEINUR, UROBILINOGEN, NITRITE, LEUKOCYTESUR in the last 72 hours.  Invalid input(s): APPERANCEUR    Imaging: Ct Abdomen Pelvis Wo Contrast  03/15/2016  CLINICAL DATA:  Preop gastrostomy. EXAM: CT ABDOMEN AND PELVIS WITHOUT CONTRAST TECHNIQUE: Multidetector CT imaging of the abdomen and pelvis was performed following the standard protocol without IV contrast. COMPARISON:  None. FINDINGS: There is dependent bilateral lower lobe consolidation associated with small pleural effusions. Patchy airspace opacities are scattered throughout the base of the lingula and right middle lobe The transverse colon is anterior to the stomach. This with May gastrostomy problematic. Rectal tube is in place. The ascending colon, transverse colon, and descending colon are distended with air-fluid levels. No obvious transition point is identified. Sigmoid colon is decompressed. Small bowel is decompressed. Postcholecystectomy Unenhanced liver, spleen, adrenal glands are within normal limits. Small calcifications within the central kidneys are likely vascular. Pancreas is atrophic. No free-fluid.  No free intraperitoneal gas. No obvious retroperitoneal adenopathy. Atherosclerotic calcifications are prominent. Right femoral venous catheter is in place. Foley catheter decompresses the bladder. T12 compression fracture is noted with cement after vertebral  augmentation. T11 compression fracture is also noted and grossly stable compared with lateral chest radiographs from last year. IMPRESSION: Transverse colon is dilated and positioned anterior to the stomach. Gastrostomy placement may be problematic. Bibasilar pulmonary consolidation and small pleural effusions. Electronically Signed   By: Marybelle Killings M.D.   On: 03/15/2016 07:56   Dg Chest 1 View  03/13/2016  CLINICAL DATA:  PICC placement EXAM: CHEST 1 VIEW COMPARISON:  Yesterday FINDINGS: Right upper extremity PICC with tip at the upper cavoatrial junction. Right IJ central line is unmoved. New tracheostomy tube and removal of orogastric tube. Persistent bibasilar lung opacity with low volumes. No pneumothorax or pneumomediastinum is seen. Chronic cardiomegaly. Status post CABG. IMPRESSION: 1. Right upper extremity PICC with tip at the upper cavoatrial junction. 2. New tracheostomy tube. 3. Low volume chest with bibasilar atelectasis or  pneumonia. Electronically Signed   By: Monte Fantasia M.D.   On: 03/13/2016 12:39     Medications:   . Marland KitchenTPN (CLINIMIX-E) Adult    . alteplase    . dexmedetomidine 0.4 mcg/kg/hr (03/15/16 0805)  . phenylephrine (NEO-SYNEPHRINE) Adult infusion 6 mcg/min (03/15/16 0923)  . pureflow 3 each (03/15/16 0752)   . antiseptic oral rinse  7 mL Mouth Rinse QID  . aspirin  325 mg Per Tube Daily  . budesonide (PULMICORT) nebulizer solution  0.25 mg Nebulization BID  . chlorhexidine gluconate (SAGE KIT)  15 mL Mouth Rinse BID  . famotidine  20 mg Per Tube Daily  . insulin aspart  0-15 Units Subcutaneous 6 times per day  . ipratropium-albuterol  3 mL Nebulization Q6H  . pencillin G potassium IV  3 Million Units Intravenous 4 times per day  . pravastatin  40 mg Per Tube q1800  . sodium chloride flush  10-40 mL Intracatheter Q12H   acetaminophen **OR** [DISCONTINUED] acetaminophen, albuterol, alteplase, fentaNYL (SUBLIMAZE) injection, heparin, midazolam, ondansetron **OR**  ondansetron (ZOFRAN) IV, sodium chloride flush  Assessment/ Plan:  75 y.o. white  male  with coronary disease with four-vessel CABG in 1998, gallbladder rupture,  Diabetes with complications of retinopathy, peripheral neuropathy, peripheral vascular disease, Aorto iliac bypass, angioplasty and stent in his left leg, kyphoplasty for chronic back pain, CKD st 3 with Baseline Cr 1.5/ GFR 46  1. Acute renal failure on chronic kidney disease stage III: baseline creatinine of 1.5, eGFR of 46. Chronic kidney disease secondary to diabetic nephropathy. Requiring CRRT. On vasopressors. Nonoliguric urine output.  Complicated acute renal failure with Anasarca, hypotension, metabolic acidosis, hyponatremia - Discontinue CRRT. Will do a trial of intermittent hemodialysis today.   2. Rt Knee septic arthritis with sepsis: strep pyogenes. Appreciate ortho and infectious disease input.  - penicillin G   3. Acute resp faliure. Intubated 4/3, extubated and re-intubated 4/13. Now with tracheostomy 4/17.  - PEG as per primary team  4. Anemia with kidney failure: status post transfusion 4/16. No CBC today. Platelets trending downward.    LOS: [place], [place] 4/20/201710:36 AM

## 2016-03-15 NOTE — Progress Notes (Signed)
Patient ID: John Schultz, male   DOB: 25-Jan-1941, 75 y.o.   MRN: [id number]  CT A/P: reveals that the stomach is high in the LUQ with an overlying transverse colon anteriorly.  No safe percutaneous window to place Gtube under fluoro.  rec surgical consultation for placement  TShick

## 2016-03-16 ENCOUNTER — Inpatient Hospital Stay: Payer: Commercial Managed Care - HMO

## 2016-03-16 LAB — GLUCOSE, CAPILLARY
GLUCOSE-CAPILLARY: 217 mg/dL — AB (ref 65–99)
GLUCOSE-CAPILLARY: 238 mg/dL — AB (ref 65–99)
GLUCOSE-CAPILLARY: 253 mg/dL — AB (ref 65–99)
GLUCOSE-CAPILLARY: 170 mg/dL — AB (ref 65–99)
GLUCOSE-CAPILLARY: 188 mg/dL — AB (ref 65–99)
GLUCOSE-CAPILLARY: 182 mg/dL — AB (ref 65–99)
Glucose-Capillary: 227 mg/dL — ABNORMAL HIGH (ref 65–99)
Glucose-Capillary: 190 mg/dL — ABNORMAL HIGH (ref 65–99)
Glucose-Capillary: 172 mg/dL — ABNORMAL HIGH (ref 65–99)
Glucose-Capillary: 156 mg/dL — ABNORMAL HIGH (ref 65–99)
Glucose-Capillary: 156 mg/dL — ABNORMAL HIGH (ref 65–99)
Glucose-Capillary: 149 mg/dL — ABNORMAL HIGH (ref 65–99)
Glucose-Capillary: 171 mg/dL — ABNORMAL HIGH (ref 65–99)

## 2016-03-16 LAB — BASIC METABOLIC PANEL
Anion gap: 4 — ABNORMAL LOW (ref 5–15)
BUN: 14 mg/dL (ref 6–20)
CHLORIDE: 104 mmol/L (ref 101–111)
CO2: 30 mmol/L (ref 22–32)
CREATININE: 1.07 mg/dL (ref 0.61–1.24)
Calcium: 7.9 mg/dL — ABNORMAL LOW (ref 8.9–10.3)
Glucose, Bld: 257 mg/dL — ABNORMAL HIGH (ref 65–99)
POTASSIUM: 4.1 mmol/L (ref 3.5–5.1)
Sodium: 138 mmol/L (ref 135–145)

## 2016-03-16 LAB — CBC
HEMATOCRIT: 25.4 % — AB (ref 40.0–52.0)
HEMATOCRIT: 24.3 % — AB (ref 40.0–52.0)
HEMOGLOBIN: 8.1 g/dL — AB (ref 13.0–18.0)
Hemoglobin: 7.2 g/dL — ABNORMAL LOW (ref 13.0–18.0)
MCH: 30.2 pg (ref 26.0–34.0)
MCH: 30.1 pg (ref 26.0–34.0)
MCHC: 28.3 g/dL — ABNORMAL LOW (ref 32.0–36.0)
MCHC: 33.3 g/dL (ref 32.0–36.0)
MCV: 106.8 fL — AB (ref 80.0–100.0)
MCV: 90.5 fL (ref 80.0–100.0)
PLATELETS: 77 10*3/uL — AB (ref 150–440)
Platelets: 84 10*3/uL — ABNORMAL LOW (ref 150–440)
RBC: 2.38 MIL/uL — AB (ref 4.40–5.90)
RBC: 2.68 MIL/uL — AB (ref 4.40–5.90)
RDW: 16.5 % — AB (ref 11.5–14.5)
RDW: 14.4 % (ref 11.5–14.5)
WBC: 3.4 10*3/uL — AB (ref 3.8–10.6)
WBC: 4.2 10*3/uL (ref 3.8–10.6)

## 2016-03-16 LAB — COMPREHENSIVE METABOLIC PANEL
ALT: 13 U/L — AB (ref 17–63)
AST: 27 U/L (ref 15–41)
Albumin: 1.5 g/dL — ABNORMAL LOW (ref 3.5–5.0)
Alkaline Phosphatase: 113 U/L (ref 38–126)
Anion gap: 6 (ref 5–15)
BILIRUBIN TOTAL: 0.9 mg/dL (ref 0.3–1.2)
BUN: 13 mg/dL (ref 6–20)
CHLORIDE: 97 mmol/L — AB (ref 101–111)
CO2: 26 mmol/L (ref 22–32)
CREATININE: 1.22 mg/dL (ref 0.61–1.24)
Calcium: 7.6 mg/dL — ABNORMAL LOW (ref 8.9–10.3)
GFR, EST NON AFRICAN AMERICAN: 57 mL/min — AB (ref >60)
Glucose, Bld: 1565 mg/dL (ref 65–99)
Potassium: 6 mmol/L — ABNORMAL HIGH (ref 3.5–5.1)
Sodium: 129 mmol/L — ABNORMAL LOW (ref 135–145)
TOTAL PROTEIN: 5.3 g/dL — AB (ref 6.5–8.1)

## 2016-03-16 LAB — BLOOD GAS, ARTERIAL
Acid-Base Excess: 8.1 mmol/L — ABNORMAL HIGH (ref 0.0–3.0)
Allens test (pass/fail): POSITIVE — AB
BICARBONATE: 32 meq/L — AB (ref 21.0–28.0)
FIO2: 0.28
LHR: 16 {breaths}/min
MECHVT: 500 mL
O2 Saturation: 96.6 %
PEEP/CPAP: 5 cmH2O
Patient temperature: 37
pCO2 arterial: 41 mmHg (ref 32.0–48.0)
pH, Arterial: 7.5 — ABNORMAL HIGH (ref 7.350–7.450)
pO2, Arterial: 79 mmHg — ABNORMAL LOW (ref 83.0–108.0)

## 2016-03-16 LAB — PHOSPHORUS
PHOSPHORUS: 6.3 mg/dL — AB (ref 2.5–4.6)
Phosphorus: 2.6 mg/dL (ref 2.5–4.6)

## 2016-03-16 LAB — MAGNESIUM
MAGNESIUM: 2 mg/dL (ref 1.7–2.4)
Magnesium: 2.2 mg/dL (ref 1.7–2.4)

## 2016-03-16 LAB — TRIGLYCERIDES: TRIGLYCERIDES: 73 mg/dL (ref <150)

## 2016-03-16 LAB — PROTIME-INR
INR: 1.27
Prothrombin Time: 16 seconds — ABNORMAL HIGH (ref 11.4–15.0)

## 2016-03-16 LAB — APTT: aPTT: 39 seconds — ABNORMAL HIGH (ref 24–36)

## 2016-03-16 MED ORDER — TRACE MINERALS CR-CU-MN-SE-ZN 10-1000-500-60 MCG/ML IV SOLN
INTRAVENOUS | Status: AC
  Administered 2016-03-16: 19:00:00 via INTRAVENOUS
  Filled 2016-03-16: qty 2280

## 2016-03-16 MED ORDER — SODIUM CHLORIDE 0.9 % IV SOLN
INTRAVENOUS | Status: DC
  Administered 2016-03-16: 1.1 [IU]/h via INTRAVENOUS
  Administered 2016-03-16: 2.6 [IU]/h via INTRAVENOUS
  Administered 2016-03-17: 1.3 [IU]/h via INTRAVENOUS
  Administered 2016-03-17: 19:00:00 via INTRAVENOUS
  Administered 2016-03-18: 8.6 [IU]/h via INTRAVENOUS
  Filled 2016-03-16 (×2): qty 2.5

## 2016-03-16 NOTE — Progress Notes (Signed)
PULMONARY / CRITICAL CARE MEDICINE   Name: John Schultz MRN: [id number] DOB: May 05, 1941    ADMISSION DATE:  02/27/2016  BRIEF HISTORY: 75 year old male past medical history of hypertension, chronic diastolic heart failure, CK, diabetes, presented on April 2 with four-day history of progressive right knee pain fever, shortness of breath, cough. Within 24 hours decompensated requiring intubation on 04/03.  Seen by orthopedics, had drain placed in right knee with purulent material. Now with mechanical ventilation and unable to weaned off after failed extubation 04/13 2/2  stridor and poor cough.   SUBJECTIVE:  No acute issues overnight.On precedex.  Remains on full vent support and CRRT. Able to wean of pressors today, IR attempted, but could not place PEG due to abnormal anatomy.  Ng placed, will start TF's  SIGNIFICANT EVENTS/STUDIES: 04/02 admitted by Hospitalist Service with 4 day history of right knee pain - swollen, erythematous and tender on exam - and severe sepsis, AKI, abnormal EKG. Cardiac markers positive (peak troponin I 8.10) 04/03 Orthopedics consultation and aspiration of R Knee, with drain placement 04/03 ID consultation 04/03 Nephrology consultation 04/04 TTE: LVEF 60-65%, no vegetations 04/04 altered mental status, worsening respiratory status, septic, intubated 04/04 R knee hemovac removed by ortho 04/06 Cardiology consultation: NSTEMI - deemed secondary to demand ischemia. Further W/U to be considered after resolution of severe sepsis and critical illness 04/09 CRRT initiated  04/10 agitated on WUA. Failed SBT. Off and on norepienphrine 04/11 Tolerating volume removal by CRRT. Failed SBT. Tolerated PS 10-14 cm H2O 04/12 Failed SBT. Agitated on WUA. Uo improving. Started back on fentanyl gtt in PM 04/13 LE venous US:  04/13 Passed SBT. Cognition remains marginal. Extubated. Post extubation stridor and poor cough. BiPAP initiated. High risk of re-intubation 04/13  Re-intubated in afternoon. ENT consult for trach tube placement - planned 04/17.  04/13 CT head: diffuse atrophy, no acute disease 04/14 CRRT resumed due to hypervolemia 4/17 trach by ENT 4/18 started back on levophed 4/19 PEG by GI attempted, unsuccessful 4/21 NG placed  INDWELLING DEVICES:: R IJ CVL 04/03 >> 4/18 R arm PICC 4/18>> ETT 04/03 >> 04/13, 04/13 >>  R femoral HD cath 04/09 >>   MICRO DATA: 04/02 MRSA PCR >> NEG 04/02 flu screen >> NEG 04/02 blood >> Strep pyogenes 04/02 knee aspirate >> Strep pyogenes 04/05 blood >> NEG 04/11 C diff >> NEG 04/12 Resp >> NOF 04/13 Resp >> normal respiratory flora 04/18 R knee aspirate>>  PCT algorithm 04/03: 22.50 > 16.65 > 7.12 PCT algorithm 04/12: 0.62 > 0.57 > 2.66  ANTIMICROBIALS:  Ceftriaxone 04/02 X 1  Vanc 04/02 >> 04/03 Pip-tazo 04/02 >> 04/03 Clinda 04/03 >> 04/07 Pen G 04/03 >>    VITAL SIGNS: Temp:  [98.8 F (37.1 C)-100 F (37.8 C)] 99.5 F (37.5 C) (04/21 0350) Pulse Rate:  [75-90] 84 (04/21 0800) Resp:  [19-27] 23 (04/21 0800) BP: (73-122)/(38-69) 109/53 mmHg (04/21 0800) SpO2:  [97 %-100 %] 97 % (04/21 0822) FiO2 (%):  [28 %] 28 % (04/21 1141) Weight:  [199 lb 11.8 oz (90.6 kg)-206 lb 12.7 oz (93.8 kg)] 199 lb 11.8 oz (90.6 kg) (04/21 0621) HEMODYNAMICS:   VENTILATOR SETTINGS: Vent Mode:  [-] PRVC FiO2 (%):  [28 %] 28 % Set Rate:  [16 bmp] 16 bmp Vt Set:  [500 mL] 500 mL PEEP:  [5 cmH20] 5 cmH20 INTAKE / OUTPUT:  Intake/Output Summary (Last 24 hours) at 03/16/16 1212 Last data filed at 03/16/16 0800  Gross per 24 hour  Intake 1203.61 ml  Output   1525 ml  Net -321.39 ml    Review of Systems  Unable to perform ROS: intubated    Physical Exam  Constitutional:  RASS -3, not F/C  HENT:  Head: Normocephalic and atraumatic.  Eyes: EOM are normal. Pupils are equal, round, and reactive to light.  Cardiovascular: Regular rhythm and normal heart sounds.   No murmur heard. Pulmonary/Chest:  No respiratory distress. He has no wheezes. He has no rales.  Abdominal: Soft. Bowel sounds are normal. He exhibits no distension. There is no tenderness.  Musculoskeletal:  R>L LE edema  Neurological: He has normal reflexes.  lethargic     LABS:  CBC  Recent Labs Lab 03/15/16 0341 03/16/16 0458 03/16/16 0646  WBC 3.7* 3.4* 4.2  HGB 8.1* 7.2* 8.1*  HCT 24.6* 25.4* 24.3*  PLT 65* 77* 84*   Coag's  Recent Labs Lab 03/16/16 0931  APTT 39*  INR 1.27   BMET  Recent Labs Lab 03/15/16 0340 03/16/16 0458 03/16/16 0645  NA 140 129* 138  K 4.3 6.0* 4.1  CL 108 97* 104  CO2 29 26 30   BUN 10 13 14   CREATININE 0.66 1.22 1.07  GLUCOSE 135* 1565* 257*   Electrolytes  Recent Labs Lab 03/15/16 0340 03/15/16 0341 03/16/16 0458 03/16/16 0645 03/16/16 0931  CALCIUM 8.3*  --  7.6* 7.9*  --   MG  --  1.6* 2.2  --  2.0  PHOS 2.9  --  6.3*  --  2.6   Sepsis Markers  Recent Labs Lab 03/10/16 0426  PROCALCITON 2.35   ABG  Recent Labs Lab 03/08/2016 0500 03/13/16 0800 03/16/16 0845  PHART 7.50* 7.41 7.50*  PCO2ART 35 38 41  PO2ART 129* 89 79*   Liver Enzymes  Recent Labs Lab 03/10/16 0426  03/18/2016 2236 03/15/16 0340 03/16/16 0458  AST 39  --   --   --  27  ALT 21  --   --   --  13*  ALKPHOS 93  --   --   --  113  BILITOT 0.6  --   --   --  0.9  ALBUMIN 1.4*  < > 1.9* 1.9* 1.5*  < > = values in this interval not displayed. Cardiac Enzymes No results for input(s): TROPONINI, PROBNP in the last 168 hours. Glucose  Recent Labs Lab 03/15/16 1931 03/15/16 2358 03/16/16 0354 03/16/16 0357 03/16/16 0748 03/16/16 1139  GLUCAP 107* 178* 238* 217* 253* 227*    CXR: images from 4/16  Continued bilateral airspace opacities with persistent perihilar and lower lobe airspace disease   DISCUSSION: 75 year old male with septic right knee, developed sepsis/renal failure requiring CRRT, also requiring intubation, now status post tracheostomy on  02/26/2016. Delirium/agitation, and poor urine output, along with inability to completely wean off ventilator are limiting factors to his overall clinical improvement.  ASSESSMENT / PLAN:  PULMONARY Acute vent dep respiratory failure - s/p trach Pulm edema pattern on CXR Failed extubation 04/13 S/P trach 4/17 by ENT P: Cont vent support - settings reviewed and/or adjusted Cont vent bundle Daily SBT and sedation vacation;  S/p trach 4/17 Will attempt PS mode and assess resp status  CARDIOVASCULAR Septic shock, resolved NSTEMI PAF P:  MAP goal > 60 mmHg PRN phenylephrine/vasopressin Cont ASA and statin Further cardiac w/u after critical illness resolves per cardiology  RENAL AKI Severe hypervolemia P: Monitor BMET intermittently Monitor I/Os Correct electrolytes as indicated Renal Service following HD as tolerated  per nephrology  GASTROINTESTINAL Diarrhea  Hemoccult positive  FEN P: SUP: enteral famotidine Cont TFs Monitor for bleeding GI attempted PEG on 4/19, unsuccessful IR attempted PEG on 4/19, unable to perform due to abnormal anatomy Surgery consulted for PEG eval Fluoro consult for NGT placement.  TPN started    HEMATOLOGIC Anemia without overt bleeding Thrombocytopenia  P: DVT px: SCDs (SQ heparin DC'd 04/12) Monitor CBC intermittently Transfuse per usual guidelines  INFECTIOUS Septic arthritis of the R knee Purulent ET secretions - much improved, now with Trach Small increase in PCT 04/14 - unclear significance P: Monitor temp, WBC count Micro and abx as above ID service following -  RIJ removed, R arm PICC placed.    ENDOCRINE DM 2, controlled Hyperglycemia improved P: Cont Lantus Cont SSI  NEUROLOGIC ICU acquired delirium - hypoactive Profound deconditioning P: RASS goal: 0, -1 Precedex initiated 04/14; continue to titrate to RASS goal Monitor for bradycardia PRN fentanyl Willl need extensive rehab if he survives this  acute phase of illness   I have personally obtained a history, examined the patient, evaluated Pertinent laboratory and RadioGraphic/imaging results, and  formulated the assessment and plan   The Patient requires high complexity decision making for assessment and support, frequent evaluation and titration of therapies, application of advanced monitoring technologies and extensive interpretation of multiple databases. Critical Care Time devoted to patient care services described in this note is 35 minutes.   Overall, patient is critically ill, prognosis is guarded.  Patient with Multiorgan failure.    Corrin Parker, M.D.  [facility]  Medical Director [place] Director [facility] Cardio-Pulmonary Department

## 2016-03-16 NOTE — Progress Notes (Signed)
Received critical glucose value over the phone by lab of 1565 from morning labs at 0458. Those labs already repeated and sent down due to concern from night shift that they were contaminated from TPN. Current CBG value in 200s. Awaiting repeat serum glucose.

## 2016-03-16 NOTE — Progress Notes (Signed)
Nutrition Follow-up  DOCUMENTATION CODES:   Not applicable  INTERVENTION:  -Discussed nutrition poc in detail during ICU rounds with MD Mungal; no access for enteral nutrition at this time; no plan for PEG/G-tube at present (pt has been evaluated by GI, IR and Surgery), NG placement has been attempted by bedside nurse as well as by IR under fluero; attempts have been unsuccessful thus far. Sarah RN plans to attempt NG placement today, if able to place successfully TF will be started. In the mean time, plan to attempt to meet nutritional needs via TPN. Nutritional needs reassessed today; goal rate of 5%AA/20% Dextrose (most concentrated TPN formula available on hospital formulary) is 95 ml/hr providing 114 g of protein, 2006 kcals, 2280 mL of fluid. Volume of goal rate discussed with MD Mungal and MD Kolloru. MDs agreeable to increasing TPN to goal of 95 ml/hr; with current renal function/fluid status, MD plans to continue daily dialysis at present. Plan to start insulin drip today to assist with blood glucose management. Pharmacy continues to assist with electrolyte management.   NUTRITION DIAGNOSIS:   Inadequate oral intake related to acute illness as evidenced by NPO status. Being addressed via TPN  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, respiratory distress with severe acidosis requiring intubation on 02/27/16  Pt tolerated HD yesterday with no UF; plan for HD again today; UOP 475 mL in past 24 hours, noted 650 mL documented thus far thoday  5%AA/20%Dextrose infusing at rate of 40 ml/hr via dedicated port in PICC line.   Pt evaluated by Surgeon yesterday and not a candidate for surgical gastrostomy at present  Patient is currently intubated on  ventilator support MV: 12 L/min Temp (24hrs), Avg:99.6 F (37.6 C), Min:98.8 F (37.1 C), Max:100 F (37.8 C)  Diet Order:  .TPN (CLINIMIX-E) Adult .TPN (CLINIMIX-E) Adult  Skin:   (stage I buttock, stage II sacral pressure ulcer)  Last BM:  03/04/2016 diarrhea, rectal tube, C.diff negative   Labs:  Glucose Profile:  Recent Labs  03/16/16 0357 03/16/16 0748 03/16/16 1139  GLUCAP 217* 253* 227*   Meds: ss novolog  Height:   Ht Readings from Last 1 Encounters:  02/28/16 6' (1.829 m)    Weight:   Wt Readings from Last 1 Encounters:  03/16/16 199 lb 11.8 oz (90.6 kg)    Ideal Body Weight:  80.9 kg  BMI:  Body mass index is 27.08 kg/(m^2).  Estimated Nutritional Needs:   Kcal:  2089 kcals   Protein:  128-170 g/kg  Fluid:  >1.9 L  EDUCATION NEEDS:   Education needs no appropriate at this time  [place], [place], [place] [phone] Pager  [phone] Weekend/On-Call Pager

## 2016-03-16 NOTE — Consult Note (Signed)
Palliative Medicine Inpatient Consult Note   Name: John Schultz Date: 03/16/2016 MRN: [id number]  DOB: 1941-11-08  Referring Physician: [name], MD  Palliative Care consult requested for this 75 y.o. male for goals of medical therapy in patient with a septic right knee.   PLAN: Pt was extubated this am, but he did not have a morphine drip started and the morphine he got at extubation time, has not lasted.  He is now noted to be breathing as much a 40 breaths per minute.  I have talked with the family about starting a low dose morphine drip for patient's comfort and to prevent pt from experiencing any kind of air hunger. Family is in agreement with this plan and also with transferring pt to 1 C for terminal comfort care.   CLINICAL NARRATIVE: Pt is a 17 you man who had a septic right knee.  He was admitted on 02/29/2016.  He developed sepsis and renal failure requiring CRRT.  He also required intubation and had a tracheostomy placed on 4/17.  He has been experiencing delerium, agitation, poor urine output and also has been unable to be completely weaned off the vent.  He was not improving late last week when a palliative care consult was first requested.  However, the plan at that time as per critical care physician, was to see how pt did over the weekend.  Pt has had sustained Vtach and had been on TPN due to inability to have PEG placed at this time. .  On 4/24, Dr. Ashby Dawes spoke to family about the pt not improving.  They later got back to the care team about starting comfort care.  Today, pt was extubated at 11:30 am.   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   SPIRITUAL SUPPORT SYSTEM: Yes.  SOCIAL HISTORY:  reports that he has never smoked. He has never used smokeless tobacco. He reports that he does not drink alcohol or use illicit drugs.  LEGAL DOCUMENTS:    CODE STATUS: DNR  PAST MEDICAL HISTORY: Past Medical History  Diagnosis Date  . CHF (congestive heart failure) ([place])   . DM (diabetes mellitus) ([place])   . Pancreatic cyst   . Skin cancer   . DDD (degenerative disc disease), thoracic   . Renal failure   . Respiratory failure ([place])     PAST SURGICAL HISTORY:  Past Surgical History  Procedure Laterality Date  . Aortoiliac bypass    . Cholecystectomy    . Kyphoplasty    . Tracheostomy tube placement N/A 03/04/2016    Procedure: TRACHEOSTOMY;  Surgeon: Carloyn Manner, MD;  Location: [facility] ORS;  Service: ENT;  Laterality: N/A;  . Peg placement N/A 03/17/2016    Procedure: PERCUTANEOUS ENDOSCOPIC GASTROSTOMY (PEG) PLACEMENT;  Surgeon: Josefine Class, MD;  Location: [facility] ENDOSCOPY;  Service: Endoscopy;  Laterality: N/A;    ALLERGIES:  is allergic to tape.  MEDICATIONS:  Current Facility-Administered Medications  Medication Dose Route Frequency Provider Last Rate Last Dose  . Marland KitchenTPN (CLINIMIX-E) Adult   Intravenous Continuous TPN Vishal  Mungal, MD 40 mL/hr at 03/16/16 1400    . Marland KitchenTPN (CLINIMIX-E) Adult   Intravenous Continuous TPN Vishal Mungal, MD      . acetaminophen (TYLENOL) tablet 650 mg  650 mg Per Tube Q6H PRN Wilhelmina Fahr, MD      . albuterol (PROVENTIL) (2.5 MG/3ML) 0.083% nebulizer solution 2.5 mg  2.5 mg Nebulization Q3H PRN Wilhelmina Paolucci, MD   2.5 mg at 03/08/16 1132  . alteplase (CATHFLO ACTIVASE) injection 2 mg  2 mg Intracatheter Continuous PRN Murlean Iba, MD   2 mg at 03/08/16 1538  . antiseptic oral rinse solution (CORINZ)  7 mL  Mouth Rinse QID Wilhelmina Kumpf, MD   7 mL at 03/16/16 1549  . aspirin tablet 325 mg  325 mg Per Tube Daily Wilhelmina Cho, MD   Stopped at 03/16/16 1000  . budesonide (PULMICORT) nebulizer solution 0.25 mg  0.25 mg Nebulization BID Wilhelmina Giannattasio, MD   0.25 mg at 03/16/16 0725  . chlorhexidine gluconate (SAGE KIT) (PERIDEX) 0.12 % solution 15 mL  15 mL Mouth Rinse BID Wilhelmina Madero, MD   15 mL at 03/16/16 [phone]  . dexmedetomidine (PRECEDEX) 400 MCG/100ML (4 mcg/mL) infusion  0.4-1.2 mcg/kg/hr Intravenous Titrated Wilhelmina Inscore, MD   Stopped at 03/16/16 1136  . famotidine (PEPCID) IVPB 20 mg premix  20 mg Intravenous Q48H Lavonia Dana, MD   20 mg at 03/15/16 1511  . fentaNYL (SUBLIMAZE) injection 50 mcg  50 mcg Intravenous Q2H PRN Wilhelmina Frutiger, MD   50 mcg at 03/16/16 1310  . heparin injection 1,000-6,000 Units  1,000-6,000 Units CRRT PRN Murlean Iba, MD   1,000 Units at 03/15/16 1052  . insulin regular (NOVOLIN R,HUMULIN R) 250 Units in sodium chloride 0.9 % 250 mL (1 Units/mL) infusion   Intravenous Continuous Vishal Mungal, MD 1.1 mL/hr at 03/16/16 1548 1.1 Units/hr at 03/16/16 1548  . ipratropium-albuterol (DUONEB) 0.5-2.5 (3) MG/3ML nebulizer solution 3 mL  3 mL Nebulization Q6H Wilhelmina Heminger, MD   3 mL at 03/16/16 1318  . midazolam (VERSED) injection 1 mg  1 mg Intravenous Q2H PRN Wilhelmina Benningfield, MD   1 mg at 03/16/16 1410  . ondansetron (ZOFRAN) tablet 4 mg  4 mg Oral Q6H PRN Idelle Crouch, MD       Or  . ondansetron [facility]) injection 4 mg  4 mg Intravenous Q6H PRN Idelle Crouch, MD      . penicillin G potassium 2 Million Units in dextrose 5 % 50 mL IVPB  2 Million Units Intravenous Q6H Lavonia Dana, MD   2 Million Units at 03/16/16 1149  . penicillin G potassium 500,000 Units in dextrose 5 % 250 mL IVPB  500,000 Units Intravenous Q dialysis Lavonia Dana, MD      . phenylephrine (NEO-SYNEPHRINE) 40 mg in dextrose 5 % 250 mL (0.16 mg/mL) infusion  0-200 mcg/min  Intravenous Continuous Wilhelmina Boddy, MD   Stopped at 03/15/16 1258  . pravastatin (PRAVACHOL) tablet 40 mg  40 mg Per Tube q1800 Wilhelmina Maka, MD   40 mg at 03/11/16 1705  . pureflow IV solution for Dialysis   CRRT Continuous Murlean Iba, MD 2,000 mL/hr at 03/15/16 0752 3 each at 03/15/16 0752  . sodium chloride flush (NS) 0.9 % injection 10-40 mL  10-40 mL Intracatheter Q12H Vishal Mungal, MD   10 mL at 03/15/16 1052  . sodium chloride flush (NS) 0.9 % injection 10-40 mL  10-40 mL Intracatheter PRN Vishal Mungal, MD      . vasopressin (PITRESSIN) 40 Units in sodium chloride 0.9 % 250 mL (0.16 Units/mL) infusion  0.03 Units/min Intravenous Continuous Vishal Mungal, MD   Stopped at 03/15/16 1230    Vital Signs: BP 109/53 mmHg  Pulse 84  Temp(Src) 99.5 F (37.5 C) (Axillary)  Resp 23  Ht 6' (1.829 m)  Wt 90.6 kg (199 lb 11.8 oz)  BMI 27.08 kg/m2  SpO2 97% Filed Weights   03/15/16 0500 03/15/16 1347 03/16/16 0621  Weight: 93.8 kg (206 lb 12.7 oz) 93.8 kg (206 lb 12.7 oz) 90.6 kg (199 lb 11.8 oz)    Estimated body mass index is 27.08 kg/(m^2) as calculated from the following:   Height as of this encounter: 6' (1.829 m).   Weight as of this encounter: 90.6 kg (199 lb 11.8 oz).  PERFORMANCE STATUS (ECOG) : 4 - Bedbound  PHYSICAL EXAM: Lying in ICU bed Unresponsive  Mouth breathing No JVD or TM Hrt rrr tachy Lungs decreased BS Shallow respirations but very frequent at 36-40 bpm Abd soft BS Ext no mottling or cyanosis as yet   LABS: CBC:    Component Value Date/Time   WBC 4.2 03/16/2016 0646   WBC 5.8 03/21/2015 0523   HGB 8.1* 03/16/2016 0646   HGB 10.9* 03/21/2015 0523   HCT 24.3* 03/16/2016 0646   HCT 33.5* 03/21/2015 0523   PLT 84* 03/16/2016 0646   PLT 126* 03/21/2015 0523   MCV 90.5 03/16/2016 0646   MCV 94 03/21/2015 0523   NEUTROABS 3.0 04/24/2015 1525   NEUTROABS 3.5 03/21/2015 0523   LYMPHSABS 1.7 04/24/2015 1525   LYMPHSABS 1.4 03/21/2015 0523    MONOABS 0.5 04/24/2015 1525   MONOABS 0.6 03/21/2015 0523   EOSABS 0.3 04/24/2015 1525   EOSABS 0.2 03/21/2015 0523   BASOSABS 0.1 04/24/2015 1525   BASOSABS 0 03/21/2015 1410   BASOSABS 0.1 03/21/2015 0523   Comprehensive Metabolic Panel:    Component Value Date/Time   NA 138 03/16/2016 0645   NA 141 03/22/2015 0435   K 4.1 03/16/2016 0645   K 4.0 03/22/2015 0435   CL 104 03/16/2016 0645   CL 92* 03/22/2015 0435   CO2 30 03/16/2016 0645   CO2 40* 03/22/2015 0435   BUN 14 03/16/2016 0645   BUN 46* 03/22/2015 0435   CREATININE 1.07 03/16/2016 0645   CREATININE 1.44* 03/22/2015 0435   GLUCOSE 257* 03/16/2016 0645   GLUCOSE 213* 03/22/2015 0435   CALCIUM 7.9* 03/16/2016 0645   CALCIUM 9.0 03/22/2015 0435   AST 27 03/16/2016 0458   ALT 13* 03/16/2016 0458   ALKPHOS 113 03/16/2016 0458   BILITOT 0.9 03/16/2016 0458   PROT 5.3* 03/16/2016 0458   ALBUMIN 1.5* 03/16/2016 0458    More than 50% of the visit was spent in counseling/coordination of care: Yes  Time Spent:  80 minutes

## 2016-03-16 NOTE — Progress Notes (Signed)
PARENTERAL NUTRITION CONSULT NOTE -FOLLOW UP   Pharmacy Consult for Electrolyte and Glucose Monitoring  Indication: TPN  Allergies  Allergen Reactions  . Tape Rash    Patient Measurements: Height: 6' (182.9 cm) Weight: 199 lb 11.8 oz (90.6 kg) IBW/kg (Calculated) : 77.6  Vital Signs: Temp: 99.5 F (37.5 C) (04/21 0350) Temp Source: Axillary (04/21 0350) BP: 109/53 mmHg (04/21 0800) Pulse Rate: 84 (04/21 0800) Intake/Output from previous day: 04/20 0701 - 04/21 0700 In: 1144.8 [I.V.:325.5; IV Piggyback:450; TPN:369.3] Out: 1296 [Urine:475; Stool:400] Intake/Output from this shift: Total I/O In: 151.8 [I.V.:31.8; TPN:120] Out: 650 [Urine:650]  Labs:  Recent Labs  03/15/16 0341 03/16/16 0458 03/16/16 0646 03/16/16 0931  WBC 3.7* 3.4* 4.2  --   HGB 8.1* 7.2* 8.1*  --   HCT 24.6* 25.4* 24.3*  --   PLT 65* 77* 84*  --   APTT  --   --   --  39*  INR  --   --   --  1.27     Recent Labs  02/26/2016 2236  03/15/16 0340 03/15/16 0341 03/16/16 0458 03/16/16 0645 03/16/16 0931  NA 140  --  140  --  129* 138  --   K 4.1  --  4.3  --  6.0* 4.1  --   CL 107  --  108  --  97* 104  --   CO2 27  --  29  --  26 30  --   GLUCOSE 111*  --  135*  --  1565* 257*  --   BUN 11  --  10  --  13 14  --   CREATININE 0.69  --  0.66  --  1.22 1.07  --   CALCIUM 8.4*  --  8.3*  --  7.6* 7.9*  --   MG  --   < >  --  1.6* 2.2  --  2.0  PHOS 2.9  --  2.9  --  6.3*  --  2.6  PROT  --   --   --   --  5.3*  --   --   ALBUMIN 1.9*  --  1.9*  --  1.5*  --   --   AST  --   --   --   --  27  --   --   ALT  --   --   --   --  13*  --   --   ALKPHOS  --   --   --   --  113  --   --   BILITOT  --   --   --   --  0.9  --   --   TRIG  --   --   --   --  73  --   --   < > = values in this interval not displayed. Estimated Creatinine Clearance: 67.5 mL/min (by C-G formula based on Cr of 1.07).    Recent Labs  03/16/16 0357 03/16/16 0748 03/16/16 1139  GLUCAP 217* 253* 227*    Medical  History: Past Medical History  Diagnosis Date  . CHF (congestive heart failure) ([facility])   . DM (diabetes mellitus) ([facility])   . Pancreatic cyst   . Skin cancer   . DDD (degenerative disc disease), thoracic   . Renal failure   . Respiratory failure ([facility])     Medications:  Scheduled:  . antiseptic oral rinse  7 mL Mouth  Rinse QID  . aspirin  325 mg Per Tube Daily  . budesonide (PULMICORT) nebulizer solution  0.25 mg Nebulization BID  . chlorhexidine gluconate (SAGE KIT)  15 mL Mouth Rinse BID  . famotidine (PEPCID) IV  20 mg Intravenous Q48H  . ipratropium-albuterol  3 mL Nebulization Q6H  . pencillin G potassium IV  2 Million Units Intravenous Q6H  . pravastatin  40 mg Per Tube q1800  . sodium chloride flush  10-40 mL Intracatheter Q12H   Infusions:  . Marland KitchenTPN (CLINIMIX-E) Adult 40 mL/hr at 03/16/16 0800  . Marland KitchenTPN (CLINIMIX-E) Adult    . alteplase    . dexmedetomidine Stopped (03/16/16 1136)  . insulin (NOVOLIN-R) infusion    . phenylephrine (NEO-SYNEPHRINE) Adult infusion Stopped (03/15/16 1258)  . pureflow 3 each (03/15/16 0752)  . vasopressin (PITRESSIN) infusion - *FOR SHOCK* Stopped (03/15/16 1230)    Insulin Requirements in the past 24 hours:   Started on insulin gtt  Current Nutrition:  Clinimix E 5/20 at 40 ml/hr scheduled to go up to a rate of 95 ml/hr tonight.   Assessment: Pharmacy consulted to assist in electrolyte and glucose management in this 75 y/o M ventilated and sedated Plan:  Electrolytes within normal limit. Recheck electrolytes with am labs.   Vernis Cabacungan D 03/16/2016,1:42 PM

## 2016-03-16 NOTE — Progress Notes (Signed)
Subjective:   Transitioned to intermittent hemodialysis well. UF of 0. Treatment of 2hours 26mnutes.  Wife at bedside.   Objective:  Vital signs in last 24 hours:  Temp:  [98.8 F (37.1 C)-100 F (37.8 C)] 99.5 F (37.5 C) (04/21 0350) Pulse Rate:  [75-90] 84 (04/21 0800) Resp:  [19-27] 23 (04/21 0800) BP: (73-122)/(38-69) 109/53 mmHg (04/21 0800) SpO2:  [97 %-100 %] 97 % (04/21 0822) FiO2 (%):  [28 %] 28 % (04/21 0822) Weight:  [90.6 kg (199 lb 11.8 oz)-93.8 kg (206 lb 12.7 oz)] 90.6 kg (199 lb 11.8 oz) (04/21 08366  Weight change: 0 kg (0 lb) Filed Weights   03/15/16 0500 03/15/16 1347 03/16/16 0621  Weight: 93.8 kg (206 lb 12.7 oz) 93.8 kg (206 lb 12.7 oz) 90.6 kg (199 lb 11.8 oz)    Intake/Output:    Intake/Output Summary (Last 24 hours) at 03/16/16 1003 Last data filed at 03/16/16 0800  Gross per 24 hour  Intake 1203.61 ml  Output   1525 ml  Net -321.39 ml     Physical Exam: General: Critically ill appearing   HEENT ETT  Neck Trachea midling  Pulm/lungs Vent assisted, bilateral rhonchi, PRVC FiO2 28%  CVS/Heart regular, soft systolic murmur  Abdomen:  Soft, non distended, BS present  Extremities: ++ peripheral edema,    Neurologic: Sedated, intubated  Skin: Heel skin breakdown  GU: Foley  Access:  Right femoral Vas-Cath/Dr. DLucky Cowboy4/9    Basic Metabolic Panel:   Recent Labs Lab 02/28/2016 0424  02/27/2016 1813 03/04/2016 2236 03/22/2016 2237 03/15/16 0340 03/15/16 0341 03/16/16 0458 03/16/16 0645 03/16/16 0931  NA 137  < > 140 140  --  140  --  129* 138  --   K 4.5  < > 4.5 4.1  --  4.3  --  6.0* 4.1  --   CL 106  < > 106 107  --  108  --  97* 104  --   CO2 27  < > 24 27  --  29  --  26 30  --   GLUCOSE 165*  < > 148* 111*  --  135*  --  1565* 257*  --   BUN 13  < > 13 11  --  10  --  13 14  --   CREATININE 0.64  < > 0.84 0.69  --  0.66  --  1.22 1.07  --   CALCIUM 8.1*  < > 8.2* 8.4*  --  8.3*  --  7.6* 7.9*  --   MG 1.7  --   --   --  1.7  --   1.6* 2.2  --  2.0  PHOS 2.2*  < > 3.6 2.9  --  2.9  --  6.3*  --  2.6  < > = values in this interval not displayed.   CBC:  Recent Labs Lab 03/06/2016 0549 03/07/2016 1020 03/15/16 0341 03/16/16 0458 03/16/16 0646  WBC 7.8 4.9 3.7* 3.4* 4.2  HGB 9.3* 8.2* 8.1* 7.2* 8.1*  HCT 27.3* 24.8* 24.6* 25.4* 24.3*  MCV 92.2 91.5 92.2 106.8* PENDING  PLT 69* 71* 65* 77* 84*      Microbiology:  Recent Results (from the past 720 hour(s))  Blood culture (routine x 2)     Status: Abnormal   Collection Time: 03/16/2016 12:59 PM  Result Value Ref Range Status   Specimen Description BLOOD LEFT FOREARM  Final   Special Requests BOTTLES DRAWN AEROBIC AND ANAEROBIC  1CC  Final   Culture  Setup Time   Final    GRAM POSITIVE COCCI AEROBIC BOTTLE ONLY CRITICAL VALUE NOTED.  VALUE IS CONSISTENT WITH PREVIOUSLY REPORTED AND CALLED VALUE.    Culture STREPTOCOCCUS PYOGENES AEROBIC BOTTLE ONLY  (A)  Final   Report Status 03/03/2016 FINAL  Final   Organism ID, Bacteria STREPTOCOCCUS PYOGENES  Final      Susceptibility   Streptococcus pyogenes - MIC*    CLINDAMYCIN Value in next row Sensitive      SENSITIVE0.25    AMPICILLIN Value in next row Sensitive      SENSITIVE0.25    ERYTHROMYCIN Value in next row Sensitive      SENSITIVE0.12    VANCOMYCIN Value in next row Sensitive      SENSITIVE0.5    CEFTRIAXONE Value in next row Sensitive      SENSITIVE0.12    LEVOFLOXACIN Value in next row Sensitive      SENSITIVE0.5    * STREPTOCOCCUS PYOGENES  Rapid Influenza A&B Antigens ([facility] only)     Status: None   Collection Time: 02/25/2016  1:00 PM  Result Value Ref Range Status   Influenza A ([facility]) NEGATIVE NEGATIVE Final   Influenza B ([facility]) NEGATIVE NEGATIVE Final  Blood culture (routine x 2)     Status: None   Collection Time: 03/17/2016  1:40 PM  Result Value Ref Range Status   Specimen Description BLOOD LEFT FOREARM  Final   Special Requests BOTTLES DRAWN AEROBIC AND ANAEROBIC  4CC  Final    Culture  Setup Time   Final    GRAM POSITIVE COCCI IN BOTH AEROBIC AND ANAEROBIC BOTTLES CRITICAL RESULT CALLED TO, READ BACK BY AND VERIFIED WITH: NATE COOKSON AT 0426 02/27/16 CAF    Culture   Final    STREPTOCOCCUS PYOGENES IN BOTH AEROBIC AND ANAEROBIC BOTTLES    Report Status 02/29/2016 FINAL  Final   Organism ID, Bacteria STREPTOCOCCUS PYOGENES  Final      Susceptibility   Streptococcus pyogenes - MIC*    CLINDAMYCIN Value in next row Sensitive      SENSITIVE<=0.25    AMPICILLIN Value in next row Sensitive      SENSITIVE<=0.25    ERYTHROMYCIN Value in next row Sensitive      SENSITIVE0.12    VANCOMYCIN Value in next row Sensitive      SENSITIVE0.5    CEFTRIAXONE Value in next row Sensitive      SENSITIVE0.12    LEVOFLOXACIN Value in next row Sensitive      SENSITIVE0.5    * STREPTOCOCCUS PYOGENES  Blood Culture ID Panel (Reflexed)     Status: Abnormal   Collection Time: 03/07/2016  1:40 PM  Result Value Ref Range Status   Enterococcus species NOT DETECTED NOT DETECTED Final   Vancomycin resistance NOT DETECTED NOT DETECTED Final   Listeria monocytogenes NOT DETECTED NOT DETECTED Final   Staphylococcus species NOT DETECTED NOT DETECTED Final   Staphylococcus aureus NOT DETECTED NOT DETECTED Final   Methicillin resistance NOT DETECTED NOT DETECTED Final   Streptococcus species NOT DETECTED NOT DETECTED Final   Streptococcus agalactiae NOT DETECTED NOT DETECTED Final   Streptococcus pneumoniae NOT DETECTED NOT DETECTED Final   Streptococcus pyogenes DETECTED (A) NOT DETECTED Final    Comment: CRITICAL RESULT CALLED TO, READ BACK BY AND VERIFIED WITH: NATE COOKSON AT 0426 02/27/16 CAF    Acinetobacter baumannii NOT DETECTED NOT DETECTED Final   Enterobacteriaceae species NOT DETECTED NOT DETECTED Final  Enterobacter cloacae complex NOT DETECTED NOT DETECTED Final   Escherichia coli NOT DETECTED NOT DETECTED Final   Klebsiella oxytoca NOT DETECTED NOT DETECTED Final    Klebsiella pneumoniae NOT DETECTED NOT DETECTED Final   Proteus species NOT DETECTED NOT DETECTED Final   Serratia marcescens NOT DETECTED NOT DETECTED Final   Carbapenem resistance NOT DETECTED NOT DETECTED Final   Haemophilus influenzae NOT DETECTED NOT DETECTED Final   Neisseria meningitidis NOT DETECTED NOT DETECTED Final   Pseudomonas aeruginosa NOT DETECTED NOT DETECTED Final   Candida albicans NOT DETECTED NOT DETECTED Final   Candida glabrata NOT DETECTED NOT DETECTED Final   Candida krusei NOT DETECTED NOT DETECTED Final   Candida parapsilosis NOT DETECTED NOT DETECTED Final   Candida tropicalis NOT DETECTED NOT DETECTED Final  Body fluid culture     Status: None   Collection Time: 03/16/2016  3:05 PM  Result Value Ref Range Status   Specimen Description R Knee  Final   Special Requests NONE  Final   Gram Stain   Final    MODERATE WBC SEEN MODERATE GRAM POSITIVE COCCI IN PAIRS AND CHAINS    Culture   Final    HEAVY GROWTH GROUP A STREP (S.PYOGENES) ISOLATED There is no known Penicillin Resistant Beta Streptococcus in the [place] For patients that are Penicillin-allergic, Erythromycin is 85-94% susceptible, and Clindamycin is 80% susceptible.  Contact Microbiology within 7 days if sensitivity testing is  required.   NO ANAEROBES ISOLATED    Report Status 02/29/2016 FINAL  Final  Body fluid culture     Status: None   Collection Time: 03/18/2016  4:10 PM  Result Value Ref Range Status   Specimen Description R Knee  Final   Special Requests NONE  Final   Gram Stain   Final    MANY WBC SEEN MANY GRAM POSITIVE COCCI IN PAIRS AND CHAINS    Culture   Final    HEAVY GROWTH GROUP A STREP (S.PYOGENES) ISOLATED There is no known Penicillin Resistant Beta Streptococcus in the [place] For patients that are Penicillin-allergic, Erythromycin is 85-94% susceptible, and Clindamycin is 80% susceptible.  Contact Microbiology within 7 days if sensitivity testing is  required.   NO ANAEROBES  ISOLATED    Report Status 02/29/2016 FINAL  Final  MRSA PCR Screening     Status: None   Collection Time: 02/27/16  3:35 AM  Result Value Ref Range Status   MRSA by PCR NEGATIVE NEGATIVE Final    Comment:        The GeneXpert MRSA Assay (FDA approved for NASAL specimens only), is one component of a comprehensive MRSA colonization surveillance program. It is not intended to diagnose MRSA infection nor to guide or monitor treatment for MRSA infections.   Urine culture     Status: None   Collection Time: 02/27/16  8:30 AM  Result Value Ref Range Status   Specimen Description URINE, RANDOM  Final   Special Requests NONE  Final   Culture NO GROWTH 1 DAY  Final   Report Status 02/28/2016 FINAL  Final  Culture, blood (single) w Reflex to PCR ID Panel     Status: None   Collection Time: 02/29/16  2:36 PM  Result Value Ref Range Status   Specimen Description BLOOD LEFT HAND  Final   Special Requests BOTTLES DRAWN AEROBIC AND ANAEROBIC [place]  Final   Culture NO GROWTH 5 DAYS  Final   Report Status 03/05/2016 FINAL  Final  C difficile quick  scan w PCR reflex     Status: None   Collection Time: 03/06/16  5:37 PM  Result Value Ref Range Status   C Diff antigen NEGATIVE NEGATIVE Final   C Diff toxin NEGATIVE NEGATIVE Final   C Diff interpretation Negative for C. difficile  Final  Culture, respiratory (NON-Expectorated)     Status: None   Collection Time: 03/07/16  8:20 AM  Result Value Ref Range Status   Specimen Description TRACHEAL ASPIRATE  Final   Special Requests NONE  Final   Gram Stain   Final    MODERATE WBC SEEN FEW SQUAMOUS EPITHELIAL CELLS PRESENT RARE YEAST    Culture Consistent with normal respiratory flora.  Final   Report Status 03/09/2016 FINAL  Final  Culture, respiratory (NON-Expectorated)     Status: None   Collection Time: 03/08/16  4:52 PM  Result Value Ref Range Status   Specimen Description TRACHEAL ASPIRATE  Final   Special Requests NONE  Final    Gram Stain NO ORGANISMS SEEN  Final   Culture Consistent with normal respiratory flora.  Final   Report Status 03/10/2016 FINAL  Final  Body fluid culture     Status: None (Preliminary result)   Collection Time: 03/13/16  3:49 PM  Result Value Ref Range Status   Specimen Description SYNOVIAL  Final   Special Requests Immunocompromised  Final   Gram Stain MANY WBC SEEN NO ORGANISMS SEEN   Final   Culture NO GROWTH 3 DAYS  Final   Report Status PENDING  Incomplete  CULTURE, BLOOD (ROUTINE X 2) w Reflex to PCR ID Panel     Status: None (Preliminary result)   Collection Time: 02/29/2016 11:55 AM  Result Value Ref Range Status   Specimen Description BLOOD LEFT HAND  Final   Special Requests   Final    BOTTLES DRAWN AEROBIC AND ANAEROBIC  AERO 13CC ANA 5CC   Culture NO GROWTH 2 DAYS  Final   Report Status PENDING  Incomplete  CULTURE, BLOOD (ROUTINE X 2) w Reflex to PCR ID Panel     Status: None (Preliminary result)   Collection Time: 03/11/2016 11:55 AM  Result Value Ref Range Status   Specimen Description BLOOD RIGHT HAND  Final   Special Requests BOTTLES DRAWN AEROBIC AND ANAEROBIC  [place]  Final   Culture NO GROWTH 2 DAYS  Final   Report Status PENDING  Incomplete    Coagulation Studies:  Recent Labs  03/16/16 0931  LABPROT 16.0*  INR 1.27    Urinalysis: No results for input(s): COLORURINE, LABSPEC, PHURINE, GLUCOSEU, HGBUR, BILIRUBINUR, KETONESUR, PROTEINUR, UROBILINOGEN, NITRITE, LEUKOCYTESUR in the last 72 hours.  Invalid input(s): APPERANCEUR    Imaging: Ct Abdomen Pelvis Wo Contrast  03/15/2016  CLINICAL DATA:  Preop gastrostomy. EXAM: CT ABDOMEN AND PELVIS WITHOUT CONTRAST TECHNIQUE: Multidetector CT imaging of the abdomen and pelvis was performed following the standard protocol without IV contrast. COMPARISON:  None. FINDINGS: There is dependent bilateral lower lobe consolidation associated with small pleural effusions. Patchy airspace opacities are scattered  throughout the base of the lingula and right middle lobe The transverse colon is anterior to the stomach. This with May gastrostomy problematic. Rectal tube is in place. The ascending colon, transverse colon, and descending colon are distended with air-fluid levels. No obvious transition point is identified. Sigmoid colon is decompressed. Small bowel is decompressed. Postcholecystectomy Unenhanced liver, spleen, adrenal glands are within normal limits. Small calcifications within the central kidneys are likely vascular. Pancreas is atrophic.  No free-fluid.  No free intraperitoneal gas. No obvious retroperitoneal adenopathy. Atherosclerotic calcifications are prominent. Right femoral venous catheter is in place. Foley catheter decompresses the bladder. T12 compression fracture is noted with cement after vertebral augmentation. T11 compression fracture is also noted and grossly stable compared with lateral chest radiographs from last year. IMPRESSION: Transverse colon is dilated and positioned anterior to the stomach. Gastrostomy placement may be problematic. Bibasilar pulmonary consolidation and small pleural effusions. Electronically Signed   By: Marybelle Killings M.D.   On: 03/15/2016 07:56   Dg Loyce Dys Tube Plc W/fl W/rad  03/15/2016  CLINICAL DATA:  Sepsis, placement of feeding tube under fluoroscopy. EXAM: NASO G TUBE PLACEMENT WITH FL AND WITH RAD FLUOROSCOPY TIME:  Radiation Exposure Index (as provided by the fluoroscopic device): 0.8 mGy COMPARISON:  None. FINDINGS: Real-time fluoroscopy was utilized for placement of a Dobhoff tube. The tube was inserted through the right nare and advanced. The tube continually coiled in the mouth and could not be advanced beyond the base of the tongue. 5 attempts were made in total. The examination was aborted at this time. IMPRESSION: Unsuccessful placement of a Dobhoff tube under fluoroscopy. Electronically Signed   By: Kathreen Devoid   On: 03/15/2016 17:14     Medications:    . Marland KitchenTPN (CLINIMIX-E) Adult 40 mL/hr at 03/16/16 0800  . alteplase    . dexmedetomidine 0.2 mcg/kg/hr (03/16/16 0957)  . phenylephrine (NEO-SYNEPHRINE) Adult infusion Stopped (03/15/16 1258)  . pureflow 3 each (03/15/16 0752)  . vasopressin (PITRESSIN) infusion - *FOR SHOCK* Stopped (03/15/16 1230)   . antiseptic oral rinse  7 mL Mouth Rinse QID  . aspirin  325 mg Per Tube Daily  . budesonide (PULMICORT) nebulizer solution  0.25 mg Nebulization BID  . chlorhexidine gluconate (SAGE KIT)  15 mL Mouth Rinse BID  . famotidine (PEPCID) IV  20 mg Intravenous Q48H  . insulin aspart  0-15 Units Subcutaneous 6 times per day  . ipratropium-albuterol  3 mL Nebulization Q6H  . pencillin G potassium IV  2 Million Units Intravenous Q6H  . pravastatin  40 mg Per Tube q1800  . sodium chloride flush  10-40 mL Intracatheter Q12H   acetaminophen **OR** [DISCONTINUED] acetaminophen, albuterol, alteplase, fentaNYL (SUBLIMAZE) injection, heparin, midazolam, ondansetron **OR** ondansetron (ZOFRAN) IV, pencillin G potassium IV, sodium chloride flush  Assessment/ Plan:  75 y.o. white  male  with coronary disease with four-vessel CABG in 1998, gallbladder rupture,  Diabetes with complications of retinopathy, peripheral neuropathy, peripheral vascular disease, Aorto iliac bypass, angioplasty and stent in his left leg, kyphoplasty for chronic back pain, CKD st 3 with Baseline Cr 1.5/ GFR 46  1. Acute renal failure on chronic kidney disease stage III: baseline creatinine of 1.5, eGFR of 46. Chronic kidney disease secondary to diabetic nephropathy. Required CRRT. Now transitioned to intermittent hemodialysis. Nonoliguric urine output.  Complicated acute renal failure with Anasarca, hypotension, metabolic acidosis, hyponatremia - Dialysis for later today. Minimal UF for now.   2. Rt Knee septic arthritis with sepsis: strep pyogenes. Appreciate ortho and infectious disease input.  - penicillin G   3. Acute resp  faliure. Intubated 4/3, extubated and re-intubated 4/13. Now with tracheostomy 4/17.  - PEG as per primary team  4. Anemia with kidney failure: status post transfusion 4/16. No CBC today. Platelets continues to be low.     LOS: [place], [place] 4/21/201710:03 AM

## 2016-03-16 NOTE — Progress Notes (Signed)
Informed MD that RNs unable to place NG tube as requested in rounds. Initially placed NG tube was found to be coiled in the esophagous and later attempts would coil or back up into the patient's mouth. MD acknowledged and ordered RN to continue with orders to increase TPN to nutrition goal per dietician.

## 2016-03-16 NOTE — Progress Notes (Signed)
Chaplain rounded the unit and provided a compassionate presence and spiritual support to the patient.  Chaplain Kostantinos Tallman [phone] 

## 2016-03-16 NOTE — Care Management Note (Addendum)
Case Management Note  Patient Details  Name: John Schultz MRN: [id number] Date of Birth: 01/31/1941  Subjective/Objective:  Presented on April 2 with 4 day hx of right knee pain,  fever, shortness of breath, cough.  he developed respiratory failure and required intubation. Now with mechanical ventilation and unable to weaned off after failed extubation 04/13 due to stridor and poor cough. Currently vented with CRRT. Precedex gtt.  Surgery unable to place PEG due to scar tissue and adhesions. NG tube in place.                  Action/Plan:  It is anticipated that patient will be a good candidate for LTACH once CRRT has been discontinued.  [facility] to revaluate patient on Monday for LTACH and start authorization. Wife updated    Expected Discharge Date:                  Expected Discharge Plan:  Long Term Acute Care (LTAC)  In-House Referral:     Discharge planning Services  CM Consult  Post Acute Care Choice:  Long Term Acute Care (LTAC) Choice offered to:  Spouse  DME Arranged:    DME Agency:     HH Arranged:    [place] Agency:     Status of Service:  In process, will continue to follow  Medicare Important Message Given:    Date Medicare IM Given:    Medicare IM give by:    Date Additional Medicare IM Given:    Additional Medicare Important Message give by:     If discussed at [place] of Stay Meetings, dates discussed:    Additional Comments:  Jolly Mango, RN 03/16/2016, 1:26 PM

## 2016-03-16 NOTE — Progress Notes (Signed)
John Schultz INFECTIOUS DISEASE PROGRESS NOTE Date of Admission:  03/13/2016     ID: John Schultz is a 75 y.o. male with  Grp A strep septic R knee and bacteremia  Principal Problem:   Septic arthritis of knee, right (HCC) Active Problems:   ARF (acute renal failure) (HCC)   Acute bronchitis   Hyponatremia   NSTEMI (non-ST elevated myocardial infarction) (HCC)   Pressure ulcer   Septic shock (HCC)   Acute respiratory failure (HCC)   Stupor   Subjective: Dr Rudene Christians aspirated knee 4.18 Now on HD. Trached.   ROS  lethargic  Medications:  Antibiotics Given (last 72 hours)    Date/Time Action Medication Dose Rate   03/13/16 1751 Given   penicillin G potassium 3 Million Units in dextrose 5 % 100 mL IVPB 3 Million Units 200 mL/hr   03/06/2016 0156 Given   penicillin G potassium 3 Million Units in dextrose 5 % 100 mL IVPB 3 Million Units 200 mL/hr   02/25/2016 0600 Given   penicillin G potassium 3 Million Units in dextrose 5 % 100 mL IVPB 3 Million Units 200 mL/hr   03/11/2016 1357 Given   ceFAZolin (ANCEF) IVPB 1 g/50 mL premix 1 g 100 mL/hr   03/11/2016 1817 Given   penicillin G potassium 3 Million Units in dextrose 5 % 100 mL IVPB 3 Million Units 200 mL/hr   03/15/16 0056 Given   penicillin G potassium 3 Million Units in dextrose 5 % 100 mL IVPB 3 Million Units 200 mL/hr   03/15/16 0528 Given   penicillin G potassium 3 Million Units in dextrose 5 % 100 mL IVPB 3 Million Units 200 mL/hr   03/15/16 1257 Given   penicillin G potassium 3 Million Units in dextrose 5 % 100 mL IVPB 3 Million Units 200 mL/hr   03/15/16 1730 Given   penicillin G potassium 2 Million Units in dextrose 5 % 50 mL IVPB 2 Million Units 100 mL/hr   03/16/16 0022 Given   penicillin G potassium 2 Million Units in dextrose 5 % 50 mL IVPB 2 Million Units 100 mL/hr   03/16/16 2620 Given   penicillin G potassium 2 Million Units in dextrose 5 % 50 mL IVPB 2 Million Units 100 mL/hr   03/16/16 1149 Given   penicillin  G potassium 2 Million Units in dextrose 5 % 50 mL IVPB 2 Million Units 100 mL/hr     . antiseptic oral rinse  7 mL Mouth Rinse QID  . aspirin  325 mg Per Tube Daily  . budesonide (PULMICORT) nebulizer solution  0.25 mg Nebulization BID  . chlorhexidine gluconate (SAGE KIT)  15 mL Mouth Rinse BID  . famotidine (PEPCID) IV  20 mg Intravenous Q48H  . ipratropium-albuterol  3 mL Nebulization Q6H  . pencillin G potassium IV  2 Million Units Intravenous Q6H  . pravastatin  40 mg Per Tube q1800  . sodium chloride flush  10-40 mL Intracatheter Q12H    Objective: Vital signs in last 24 hours: Temp:  [98.8 F (37.1 C)-100 F (37.8 C)] 99.5 F (37.5 C) (04/21 0350) Pulse Rate:  [75-90] 84 (04/21 0800) Resp:  [19-27] 23 (04/21 0800) BP: (73-122)/(38-69) 109/53 mmHg (04/21 0800) SpO2:  [97 %-100 %] 97 % (04/21 0822) FiO2 (%):  [28 %] 28 % (04/21 1212) Weight:  [90.6 kg (199 lb 11.8 oz)] 90.6 kg (199 lb 11.8 oz) (04/21 3559) Constitutional: lethargic, critically ill appearing.  HENT: perrla, eomi Mouth/Throat: Oropharynx - etet  in place  Cardiovascular: tachy, reg Pulmonary/Chest bil rhonchi Abdominal: Soft. Bowel sounds are normal. He exhibits no distension. There is no tenderness.  Lymphadenopathy: He has no cervical adenopathy.  Neurological:lethargic   Skin: Skin is warm and dry. No rash noted. No erythema.  Psychiatric: sedated EXT edematous Ext - L knee with some pain with movement, mild warmth  Lab Results  Recent Labs  03/16/16 0458 03/16/16 0645 03/16/16 0646  WBC 3.4*  --  4.2  HGB 7.2*  --  8.1*  HCT 25.4*  --  24.3*  NA 129* 138  --   K 6.0* 4.1  --   CL 97* 104  --   CO2 26 30  --   BUN 13 14  --   CREATININE 1.22 1.07  --     Microbiology: Results for orders placed or performed during the hospital encounter of 03/11/2016  Blood culture (routine x 2)     Status: Abnormal   Collection Time: 02/25/2016 12:59 PM  Result Value Ref Range Status   Specimen  Description BLOOD LEFT FOREARM  Final   Special Requests BOTTLES DRAWN AEROBIC AND ANAEROBIC  1CC  Final   Culture  Setup Time   Final    GRAM POSITIVE COCCI AEROBIC BOTTLE ONLY CRITICAL VALUE NOTED.  VALUE IS CONSISTENT WITH PREVIOUSLY REPORTED AND CALLED VALUE.    Culture STREPTOCOCCUS PYOGENES AEROBIC BOTTLE ONLY  (A)  Final   Report Status 03/03/2016 FINAL  Final   Organism ID, Bacteria STREPTOCOCCUS PYOGENES  Final      Susceptibility   Streptococcus pyogenes - MIC*    CLINDAMYCIN Value in next row Sensitive      SENSITIVE0.25    AMPICILLIN Value in next row Sensitive      SENSITIVE0.25    ERYTHROMYCIN Value in next row Sensitive      SENSITIVE0.12    VANCOMYCIN Value in next row Sensitive      SENSITIVE0.5    CEFTRIAXONE Value in next row Sensitive      SENSITIVE0.12    LEVOFLOXACIN Value in next row Sensitive      SENSITIVE0.5    * STREPTOCOCCUS PYOGENES  Rapid Influenza A&B Antigens ([facility] only)     Status: None   Collection Time: 02/28/2016  1:00 PM  Result Value Ref Range Status   Influenza A ([facility]) NEGATIVE NEGATIVE Final   Influenza B ([facility]) NEGATIVE NEGATIVE Final  Blood culture (routine x 2)     Status: None   Collection Time: 02/29/2016  1:40 PM  Result Value Ref Range Status   Specimen Description BLOOD LEFT FOREARM  Final   Special Requests BOTTLES DRAWN AEROBIC AND ANAEROBIC  4CC  Final   Culture  Setup Time   Final    GRAM POSITIVE COCCI IN BOTH AEROBIC AND ANAEROBIC BOTTLES CRITICAL RESULT CALLED TO, READ BACK BY AND VERIFIED WITH: NATE COOKSON AT 3419 02/27/16 CAF    Culture   Final    STREPTOCOCCUS PYOGENES IN BOTH AEROBIC AND ANAEROBIC BOTTLES    Report Status 02/29/2016 FINAL  Final   Organism ID, Bacteria STREPTOCOCCUS PYOGENES  Final      Susceptibility   Streptococcus pyogenes - MIC*    CLINDAMYCIN Value in next row Sensitive      SENSITIVE<=0.25    AMPICILLIN Value in next row Sensitive      SENSITIVE<=0.25    ERYTHROMYCIN Value in next row  Sensitive      SENSITIVE0.12    VANCOMYCIN Value in next row  Sensitive      SENSITIVE0.5    CEFTRIAXONE Value in next row Sensitive      SENSITIVE0.12    LEVOFLOXACIN Value in next row Sensitive      SENSITIVE0.5    * STREPTOCOCCUS PYOGENES  Blood Culture ID Panel (Reflexed)     Status: Abnormal   Collection Time: 03/11/2016  1:40 PM  Result Value Ref Range Status   Enterococcus species NOT DETECTED NOT DETECTED Final   Vancomycin resistance NOT DETECTED NOT DETECTED Final   Listeria monocytogenes NOT DETECTED NOT DETECTED Final   Staphylococcus species NOT DETECTED NOT DETECTED Final   Staphylococcus aureus NOT DETECTED NOT DETECTED Final   Methicillin resistance NOT DETECTED NOT DETECTED Final   Streptococcus species NOT DETECTED NOT DETECTED Final   Streptococcus agalactiae NOT DETECTED NOT DETECTED Final   Streptococcus pneumoniae NOT DETECTED NOT DETECTED Final   Streptococcus pyogenes DETECTED (A) NOT DETECTED Final    Comment: CRITICAL RESULT CALLED TO, READ BACK BY AND VERIFIED WITH: NATE COOKSON AT 0426 02/27/16 CAF    Acinetobacter baumannii NOT DETECTED NOT DETECTED Final   Enterobacteriaceae species NOT DETECTED NOT DETECTED Final   Enterobacter cloacae complex NOT DETECTED NOT DETECTED Final   Escherichia coli NOT DETECTED NOT DETECTED Final   Klebsiella oxytoca NOT DETECTED NOT DETECTED Final   Klebsiella pneumoniae NOT DETECTED NOT DETECTED Final   Proteus species NOT DETECTED NOT DETECTED Final   Serratia marcescens NOT DETECTED NOT DETECTED Final   Carbapenem resistance NOT DETECTED NOT DETECTED Final   Haemophilus influenzae NOT DETECTED NOT DETECTED Final   Neisseria meningitidis NOT DETECTED NOT DETECTED Final   Pseudomonas aeruginosa NOT DETECTED NOT DETECTED Final   Candida albicans NOT DETECTED NOT DETECTED Final   Candida glabrata NOT DETECTED NOT DETECTED Final   Candida krusei NOT DETECTED NOT DETECTED Final   Candida parapsilosis NOT DETECTED NOT  DETECTED Final   Candida tropicalis NOT DETECTED NOT DETECTED Final  Body fluid culture     Status: None   Collection Time: 03/13/2016  3:05 PM  Result Value Ref Range Status   Specimen Description R Knee  Final   Special Requests NONE  Final   Gram Stain   Final    MODERATE WBC SEEN MODERATE GRAM POSITIVE COCCI IN PAIRS AND CHAINS    Culture   Final    HEAVY GROWTH GROUP A STREP (S.PYOGENES) ISOLATED There is no known Penicillin Resistant Beta Streptococcus in the [place] For patients that are Penicillin-allergic, Erythromycin is 85-94% susceptible, and Clindamycin is 80% susceptible.  Contact Microbiology within 7 days if sensitivity testing is  required.   NO ANAEROBES ISOLATED    Report Status 02/29/2016 FINAL  Final  Body fluid culture     Status: None   Collection Time: 03/11/2016  4:10 PM  Result Value Ref Range Status   Specimen Description R Knee  Final   Special Requests NONE  Final   Gram Stain   Final    MANY WBC SEEN MANY GRAM POSITIVE COCCI IN PAIRS AND CHAINS    Culture   Final    HEAVY GROWTH GROUP A STREP (S.PYOGENES) ISOLATED There is no known Penicillin Resistant Beta Streptococcus in the [place] For patients that are Penicillin-allergic, Erythromycin is 85-94% susceptible, and Clindamycin is 80% susceptible.  Contact Microbiology within 7 days if sensitivity testing is  required.   NO ANAEROBES ISOLATED    Report Status 02/29/2016 FINAL  Final  MRSA PCR Screening     Status:  None   Collection Time: 02/27/16  3:35 AM  Result Value Ref Range Status   MRSA by PCR NEGATIVE NEGATIVE Final    Comment:        The GeneXpert MRSA Assay (FDA approved for NASAL specimens only), is one component of a comprehensive MRSA colonization surveillance program. It is not intended to diagnose MRSA infection nor to guide or monitor treatment for MRSA infections.   Urine culture     Status: None   Collection Time: 02/27/16  8:30 AM  Result Value Ref Range Status   Specimen  Description URINE, RANDOM  Final   Special Requests NONE  Final   Culture NO GROWTH 1 DAY  Final   Report Status 02/28/2016 FINAL  Final  Culture, blood (single) w Reflex to PCR ID Panel     Status: None   Collection Time: 02/29/16  2:36 PM  Result Value Ref Range Status   Specimen Description BLOOD LEFT HAND  Final   Special Requests BOTTLES DRAWN AEROBIC AND ANAEROBIC [place]  Final   Culture NO GROWTH 5 DAYS  Final   Report Status 03/05/2016 FINAL  Final  C difficile quick scan w PCR reflex     Status: None   Collection Time: 03/06/16  5:37 PM  Result Value Ref Range Status   C Diff antigen NEGATIVE NEGATIVE Final   C Diff toxin NEGATIVE NEGATIVE Final   C Diff interpretation Negative for C. difficile  Final  Culture, respiratory (NON-Expectorated)     Status: None   Collection Time: 03/07/16  8:20 AM  Result Value Ref Range Status   Specimen Description TRACHEAL ASPIRATE  Final   Special Requests NONE  Final   Gram Stain   Final    MODERATE WBC SEEN FEW SQUAMOUS EPITHELIAL CELLS PRESENT RARE YEAST    Culture Consistent with normal respiratory flora.  Final   Report Status 03/09/2016 FINAL  Final  Culture, respiratory (NON-Expectorated)     Status: None   Collection Time: 03/08/16  4:52 PM  Result Value Ref Range Status   Specimen Description TRACHEAL ASPIRATE  Final   Special Requests NONE  Final   Gram Stain NO ORGANISMS SEEN  Final   Culture Consistent with normal respiratory flora.  Final   Report Status 03/10/2016 FINAL  Final  Body fluid culture     Status: None (Preliminary result)   Collection Time: 03/13/16  3:49 PM  Result Value Ref Range Status   Specimen Description SYNOVIAL  Final   Special Requests Immunocompromised  Final   Gram Stain MANY WBC SEEN NO ORGANISMS SEEN   Final   Culture NO GROWTH 3 DAYS  Final   Report Status PENDING  Incomplete  CULTURE, BLOOD (ROUTINE X 2) w Reflex to PCR ID Panel     Status: None (Preliminary result)    Collection Time: 03/21/2016 11:55 AM  Result Value Ref Range Status   Specimen Description BLOOD LEFT HAND  Final   Special Requests   Final    BOTTLES DRAWN AEROBIC AND ANAEROBIC  AERO 13CC ANA 5CC   Culture NO GROWTH 2 DAYS  Final   Report Status PENDING  Incomplete  CULTURE, BLOOD (ROUTINE X 2) w Reflex to PCR ID Panel     Status: None (Preliminary result)   Collection Time: 02/25/2016 11:55 AM  Result Value Ref Range Status   Specimen Description BLOOD RIGHT HAND  Final   Special Requests BOTTLES DRAWN AEROBIC AND ANAEROBIC  [place]  Final  Culture NO GROWTH 2 DAYS  Final   Report Status PENDING  Incomplete    Studies/Results: Ct Abdomen Pelvis Wo Contrast  03/15/2016  CLINICAL DATA:  Preop gastrostomy. EXAM: CT ABDOMEN AND PELVIS WITHOUT CONTRAST TECHNIQUE: Multidetector CT imaging of the abdomen and pelvis was performed following the standard protocol without IV contrast. COMPARISON:  None. FINDINGS: There is dependent bilateral lower lobe consolidation associated with small pleural effusions. Patchy airspace opacities are scattered throughout the base of the lingula and right middle lobe The transverse colon is anterior to the stomach. This with May gastrostomy problematic. Rectal tube is in place. The ascending colon, transverse colon, and descending colon are distended with air-fluid levels. No obvious transition point is identified. Sigmoid colon is decompressed. Small bowel is decompressed. Postcholecystectomy Unenhanced liver, spleen, adrenal glands are within normal limits. Small calcifications within the central kidneys are likely vascular. Pancreas is atrophic. No free-fluid.  No free intraperitoneal gas. No obvious retroperitoneal adenopathy. Atherosclerotic calcifications are prominent. Right femoral venous catheter is in place. Foley catheter decompresses the bladder. T12 compression fracture is noted with cement after vertebral augmentation. T11 compression fracture is also noted and  grossly stable compared with lateral chest radiographs from last year. IMPRESSION: Transverse colon is dilated and positioned anterior to the stomach. Gastrostomy placement may be problematic. Bibasilar pulmonary consolidation and small pleural effusions. Electronically Signed   By: Marybelle Killings M.D.   On: 03/15/2016 07:56   Dg Abd 1 View  03/16/2016  CLINICAL DATA:  Evaluate NG tube placement EXAM: ABDOMEN - 1 VIEW COMPARISON:  03/15/2016 FINDINGS: The nasogastric tube is not visualized within the field of view. Gaseous distension of the colon is identified. There is a right groin central venous catheter with tip overlying the expected location of the distal IVC. IMPRESSION: 1. No nasogastric tube is visualized and may be coiled within the upper esophagus. Electronically Signed   By: Kerby Moors M.D.   On: 03/16/2016 13:07   Dg Loyce Dys Tube Plc W/fl W/rad  03/15/2016  CLINICAL DATA:  Sepsis, placement of feeding tube under fluoroscopy. EXAM: NASO G TUBE PLACEMENT WITH FL AND WITH RAD FLUOROSCOPY TIME:  Radiation Exposure Index (as provided by the fluoroscopic device): 0.8 mGy COMPARISON:  None. FINDINGS: Real-time fluoroscopy was utilized for placement of a Dobhoff tube. The tube was inserted through the right nare and advanced. The tube continually coiled in the mouth and could not be advanced beyond the base of the tongue. 5 attempts were made in total. The examination was aborted at this time. IMPRESSION: Unsuccessful placement of a Dobhoff tube under fluoroscopy. Electronically Signed   By: Kathreen Devoid   On: 03/15/2016 17:14    Assessment/Plan: John Schultz is a 75 y.o. male with Group A strep septic knee (Synovial WBC > 300K), sepsis, hypotensive, intubated, no obvious sourse with no evidence cellulitis and no PNA on cxr.  Echo neg for vegetation Fu bcx 4/5 ngtd Dr Rudene Christians following for the knee. Started on CRRT 4/12 - increasing wbc and secretions. C diff neg. Sputum cx sent. CXR without  much change 4/13- no fevers, wbc down a little. Extubated today  58 -= s/p trach 4/17- remains on crrt.  4/21- asp from knee 4.18 with 19K wbc, cx negative. Low grade fevers, wbc 4  Recommendations Continue  pcn  May need repeat aspiration if fluid reaccumulates Will need 6 week course of PCN from the washout done 4/2 - stop date MAY 14th Will need weekly cbc, cmet  while on this Continue supportive care. Has multiple lines in place - if wbc does  increase or febrile ->repeat bcx  Thank you very much for the consult. Will follow with you.  Chalee Hirota P   03/16/2016, 2:19 PM

## 2016-03-16 NOTE — Progress Notes (Signed)
Inpatient Diabetes Program Recommendations  AACE/ADA: New Consensus Statement on Inpatient Glycemic Control (2015)  Target Ranges:  Prepandial:   less than 140 mg/dL      Peak postprandial:   less than 180 mg/dL (1-2 hours)      Critically ill patients:  140 - 180 mg/dL   Review of Glycemic Control  Results for NAFEES, STEUER (MRN [id number]) as of 03/16/2016 09:24  Ref. Range 03/15/2016 19:31 03/15/2016 23:58 03/16/2016 03:54 03/16/2016 03:57 03/16/2016 07:48  Glucose-Capillary Latest Ref Range: 65-99 mg/dL 107 (H) 178 (H) 238 (H) 217 (H) 253 (H)    Diabetes history: DM2 Outpatient Diabetes medications: Lantus 15 units QPM, Novolog 5 units TID with meals, Metformin 1500 mg QPM Current orders for Inpatient glycemic control: Novolog 0-15 units 6x/day  * Lantus last used on 03/07/16    If patient remains on TPN, please consider ordering Novolog 4 units Q4H for tube feeding coverage. Please note that if tube feeding is stopped or held then Novolog tube feeding coverage would also be stopped or held.  Gentry Fitz, RN, BA, MHA, CDE Diabetes Coordinator Inpatient Diabetes Program  [phone] (Team Pager) [phone] ([place]) 03/16/2016 9:30 AM

## 2016-03-17 LAB — RENAL FUNCTION PANEL
ALBUMIN: 1.7 g/dL — AB (ref 3.5–5.0)
ANION GAP: 5 (ref 5–15)
BUN: 23 mg/dL — ABNORMAL HIGH (ref 6–20)
CHLORIDE: 103 mmol/L (ref 101–111)
CO2: 31 mmol/L (ref 22–32)
Calcium: 8.1 mg/dL — ABNORMAL LOW (ref 8.9–10.3)
Creatinine, Ser: 0.95 mg/dL (ref 0.61–1.24)
GFR calc Af Amer: >60 mL/min (ref >60)
GLUCOSE: 140 mg/dL — AB (ref 65–99)
PHOSPHORUS: 3.6 mg/dL (ref 2.5–4.6)
POTASSIUM: 3.9 mmol/L (ref 3.5–5.1)
Sodium: 139 mmol/L (ref 135–145)

## 2016-03-17 LAB — GLUCOSE, CAPILLARY
GLUCOSE-CAPILLARY: 182 mg/dL — AB (ref 65–99)
GLUCOSE-CAPILLARY: 146 mg/dL — AB (ref 65–99)
GLUCOSE-CAPILLARY: 137 mg/dL — AB (ref 65–99)
GLUCOSE-CAPILLARY: 126 mg/dL — AB (ref 65–99)
GLUCOSE-CAPILLARY: 114 mg/dL — AB (ref 65–99)
GLUCOSE-CAPILLARY: 100 mg/dL — AB (ref 65–99)
GLUCOSE-CAPILLARY: 202 mg/dL — AB (ref 65–99)
GLUCOSE-CAPILLARY: 137 mg/dL — AB (ref 65–99)
GLUCOSE-CAPILLARY: 100 mg/dL — AB (ref 65–99)
GLUCOSE-CAPILLARY: 102 mg/dL — AB (ref 65–99)
Glucose-Capillary: 214 mg/dL — ABNORMAL HIGH (ref 65–99)
Glucose-Capillary: 211 mg/dL — ABNORMAL HIGH (ref 65–99)
Glucose-Capillary: 145 mg/dL — ABNORMAL HIGH (ref 65–99)
Glucose-Capillary: 135 mg/dL — ABNORMAL HIGH (ref 65–99)
Glucose-Capillary: 144 mg/dL — ABNORMAL HIGH (ref 65–99)
Glucose-Capillary: 149 mg/dL — ABNORMAL HIGH (ref 65–99)
Glucose-Capillary: 175 mg/dL — ABNORMAL HIGH (ref 65–99)
Glucose-Capillary: 220 mg/dL — ABNORMAL HIGH (ref 65–99)
Glucose-Capillary: 240 mg/dL — ABNORMAL HIGH (ref 65–99)
Glucose-Capillary: 229 mg/dL — ABNORMAL HIGH (ref 65–99)
Glucose-Capillary: 95 mg/dL (ref 65–99)

## 2016-03-17 LAB — BODY FLUID CULTURE: CULTURE: NO GROWTH

## 2016-03-17 LAB — MAGNESIUM: MAGNESIUM: 2.7 mg/dL — AB (ref 1.7–2.4)

## 2016-03-17 MED ORDER — TRACE MINERALS CR-CU-MN-SE-ZN 10-1000-500-60 MCG/ML IV SOLN
INTRAVENOUS | Status: DC
  Administered 2016-03-17: 18:00:00 via INTRAVENOUS
  Filled 2016-03-17: qty 2280

## 2016-03-17 NOTE — Progress Notes (Signed)
PARENTERAL NUTRITION CONSULT NOTE -FOLLOW UP   Pharmacy Consult for Electrolyte and Glucose Monitoring  Indication: TPN  Allergies  Allergen Reactions  . Tape Rash    Patient Measurements: Height: 6' (182.9 cm) Weight: 201 lb 11.5 oz (91.5 kg) IBW/kg (Calculated) : 77.6  Vital Signs: Temp: 99 F (37.2 C) (04/22 0751) Temp Source: Axillary (04/22 0751) BP: 102/82 mmHg (04/22 0800) Pulse Rate: 112 (04/22 0800) Intake/Output from previous day: 04/21 0701 - 04/22 0700 In: 2022.6 [I.V.:125.4; IV Piggyback:200; TPN:1697.3] Out: 1775 [Urine:1675; Stool:100] Intake/Output from this shift: Total I/O In: 98.4 [I.V.:3.4; TPN:95] Out: -   Labs:  Recent Labs  03/15/16 0341 03/16/16 0458 03/16/16 0646 03/16/16 0931  WBC 3.7* 3.4* 4.2  --   HGB 8.1* 7.2* 8.1*  --   HCT 24.6* 25.4* 24.3*  --   PLT 65* 77* 84*  --   APTT  --   --   --  39*  INR  --   --   --  1.27     Recent Labs  03/15/16 0340  03/16/16 0458 03/16/16 0645 03/16/16 0931 03/17/16 0450 03/17/16 0612  NA 140  --  129* 138  --   --  139  K 4.3  --  6.0* 4.1  --   --  3.9  CL 108  --  97* 104  --   --  103  CO2 29  --  26 30  --   --  31  GLUCOSE 135*  --  1565* 257*  --   --  140*  BUN 10  --  13 14  --   --  23*  CREATININE 0.66  --  1.22 1.07  --   --  0.95  CALCIUM 8.3*  --  7.6* 7.9*  --   --  8.1*  MG  --   < > 2.2  --  2.0 2.7*  --   PHOS 2.9  --  6.3*  --  2.6  --  3.6  PROT  --   --  5.3*  --   --   --   --   ALBUMIN 1.9*  --  1.5*  --   --   --  1.7*  AST  --   --  27  --   --   --   --   ALT  --   --  13*  --   --   --   --   ALKPHOS  --   --  113  --   --   --   --   BILITOT  --   --  0.9  --   --   --   --   TRIG  --   --  73  --   --   --   --   < > = values in this interval not displayed. Estimated Creatinine Clearance: 76 mL/min (by C-G formula based on Cr of 0.95).    Recent Labs  03/17/16 0618 03/17/16 0700 03/17/16 0726  GLUCAP 137* 135* 126*    Medical History: Past  Medical History  Diagnosis Date  . CHF (congestive heart failure) (HCC)   . DM (diabetes mellitus) (HCC)   . Pancreatic cyst   . Skin cancer   . DDD (degenerative disc disease), thoracic   . Renal failure   . Respiratory failure (HCC)     Medications:  Scheduled:  . antiseptic oral rinse  7 mL Mouth   Rinse QID  . aspirin  325 mg Per Tube Daily  . budesonide (PULMICORT) nebulizer solution  0.25 mg Nebulization BID  . chlorhexidine gluconate (SAGE KIT)  15 mL Mouth Rinse BID  . famotidine (PEPCID) IV  20 mg Intravenous Q48H  . ipratropium-albuterol  3 mL Nebulization Q6H  . pencillin G potassium IV  2 Million Units Intravenous Q6H  . pravastatin  40 mg Per Tube q1800  . sodium chloride flush  10-40 mL Intracatheter Q12H   Infusions:  . Marland KitchenTPN (CLINIMIX-E) Adult 95 mL/hr at 03/16/16 2100  . alteplase    . dexmedetomidine Stopped (03/16/16 1136)  . insulin (NOVOLIN-R) infusion 2.3 Units/hr (03/17/16 0803)  . phenylephrine (NEO-SYNEPHRINE) Adult infusion Stopped (03/15/16 1258)  . pureflow 3 each (03/15/16 0752)  . vasopressin (PITRESSIN) infusion - *FOR SHOCK* Stopped (03/15/16 1230)    Insulin Requirements in the past 24 hours:  On insulin drip  Current Nutrition:  Clinimix E 5/20 at 95 ml/hr tonight.   Assessment: Pharmacy consulted to assist in electrolyte and glucose management in this 75 y/o M ventilated and sedated  Plan:  Electrolyte WNL with exception of slightly elevated magnesium. No supplementation required at this time.  Rexene Edison, PharmD Clinical Pharmacist  03/17/2016 8:17 AM

## 2016-03-17 NOTE — Progress Notes (Signed)
Subjective:   Seen and examined on intermittent hemodialysis. Tolerating treatment well. UF goal of 1 litre  UOP 1675  Objective:  Vital signs in last 24 hours:  Temp:  [98.3 F (36.8 C)-100 F (37.8 C)] 100 F (37.8 C) (04/22 1117) Pulse Rate:  [89-124] 107 (04/22 1100) Resp:  [18-38] 25 (04/22 1100) BP: (101-165)/(53-96) 101/60 mmHg (04/22 1100) SpO2:  [98 %-100 %] 100 % (04/22 1100) FiO2 (%):  [28 %] 28 % (04/22 1119) Weight:  [91.5 kg (201 lb 11.5 oz)-92.5 kg (203 lb 14.8 oz)] 92.5 kg (203 lb 14.8 oz) (04/22 1120)  Weight change:  Filed Weights   03/16/16 0621 03/17/16 0751 03/17/16 1120  Weight: 90.6 kg (199 lb 11.8 oz) 91.5 kg (201 lb 11.5 oz) 92.5 kg (203 lb 14.8 oz)    Intake/Output:    Intake/Output Summary (Last 24 hours) at 03/17/16 1139 Last data filed at 03/17/16 1115  Gross per 24 hour  Intake 2231.98 ml  Output   1125 ml  Net 1106.98 ml     Physical Exam: General: Critically ill appearing   HEENT ETT  Neck Trachea midling  Pulm/lungs Vent assisted, bilateral rhonchi, PRVC FiO2 28%  CVS/Heart regular, soft systolic murmur  Abdomen:  Soft, non distended, BS present  Extremities: ++ peripheral edema,    Neurologic: Sedated, intubated  Skin: Heel skin breakdown  GU: Foley  Access:  Right femoral Vas-Cath/Dr. Lucky Cowboy 4/9    Basic Metabolic Panel:   Recent Labs Lab 03/11/2016 2236 02/27/2016 2237 03/15/16 0340 03/15/16 0341 03/16/16 0458 03/16/16 0645 03/16/16 0931 03/17/16 0450 03/17/16 0612  NA 140  --  140  --  129* 138  --   --  139  K 4.1  --  4.3  --  6.0* 4.1  --   --  3.9  CL 107  --  108  --  97* 104  --   --  103  CO2 27  --  29  --  26 30  --   --  31  GLUCOSE 111*  --  135*  --  1565* 257*  --   --  140*  BUN 11  --  10  --  13 14  --   --  23*  CREATININE 0.69  --  0.66  --  1.22 1.07  --   --  0.95  CALCIUM 8.4*  --  8.3*  --  7.6* 7.9*  --   --  8.1*  MG  --  1.7  --  1.6* 2.2  --  2.0 2.7*  --   PHOS 2.9  --  2.9  --  6.3*   --  2.6  --  3.6     CBC:  Recent Labs Lab 03/07/2016 0549 03/11/2016 1020 03/15/16 0341 03/16/16 0458 03/16/16 0646  WBC 7.8 4.9 3.7* 3.4* 4.2  HGB 9.3* 8.2* 8.1* 7.2* 8.1*  HCT 27.3* 24.8* 24.6* 25.4* 24.3*  MCV 92.2 91.5 92.2 106.8* 90.5  PLT 69* 71* 65* 77* 84*      Microbiology:  Recent Results (from the past 720 hour(s))  Blood culture (routine x 2)     Status: Abnormal   Collection Time: 03/18/2016 12:59 PM  Result Value Ref Range Status   Specimen Description BLOOD LEFT FOREARM  Final   Special Requests BOTTLES DRAWN AEROBIC AND ANAEROBIC  1CC  Final   Culture  Setup Time   Final    GRAM POSITIVE COCCI AEROBIC BOTTLE ONLY CRITICAL VALUE  NOTED.  VALUE IS CONSISTENT WITH PREVIOUSLY REPORTED AND CALLED VALUE.    Culture STREPTOCOCCUS PYOGENES AEROBIC BOTTLE ONLY  (A)  Final   Report Status 03/03/2016 FINAL  Final   Organism ID, Bacteria STREPTOCOCCUS PYOGENES  Final      Susceptibility   Streptococcus pyogenes - MIC*    CLINDAMYCIN Value in next row Sensitive      SENSITIVE0.25    AMPICILLIN Value in next row Sensitive      SENSITIVE0.25    ERYTHROMYCIN Value in next row Sensitive      SENSITIVE0.12    VANCOMYCIN Value in next row Sensitive      SENSITIVE0.5    CEFTRIAXONE Value in next row Sensitive      SENSITIVE0.12    LEVOFLOXACIN Value in next row Sensitive      SENSITIVE0.5    * STREPTOCOCCUS PYOGENES  Rapid Influenza A&B Antigens ([facility] only)     Status: None   Collection Time: 03/02/2016  1:00 PM  Result Value Ref Range Status   Influenza A ([facility]) NEGATIVE NEGATIVE Final   Influenza B ([facility]) NEGATIVE NEGATIVE Final  Blood culture (routine x 2)     Status: None   Collection Time: 02/25/2016  1:40 PM  Result Value Ref Range Status   Specimen Description BLOOD LEFT FOREARM  Final   Special Requests BOTTLES DRAWN AEROBIC AND ANAEROBIC  4CC  Final   Culture  Setup Time   Final    GRAM POSITIVE COCCI IN BOTH AEROBIC AND ANAEROBIC BOTTLES CRITICAL RESULT  CALLED TO, READ BACK BY AND VERIFIED WITH: NATE COOKSON AT 0426 02/27/16 CAF    Culture   Final    STREPTOCOCCUS PYOGENES IN BOTH AEROBIC AND ANAEROBIC BOTTLES    Report Status 02/29/2016 FINAL  Final   Organism ID, Bacteria STREPTOCOCCUS PYOGENES  Final      Susceptibility   Streptococcus pyogenes - MIC*    CLINDAMYCIN Value in next row Sensitive      SENSITIVE<=0.25    AMPICILLIN Value in next row Sensitive      SENSITIVE<=0.25    ERYTHROMYCIN Value in next row Sensitive      SENSITIVE0.12    VANCOMYCIN Value in next row Sensitive      SENSITIVE0.5    CEFTRIAXONE Value in next row Sensitive      SENSITIVE0.12    LEVOFLOXACIN Value in next row Sensitive      SENSITIVE0.5    * STREPTOCOCCUS PYOGENES  Blood Culture ID Panel (Reflexed)     Status: Abnormal   Collection Time: 03/24/2016  1:40 PM  Result Value Ref Range Status   Enterococcus species NOT DETECTED NOT DETECTED Final   Vancomycin resistance NOT DETECTED NOT DETECTED Final   Listeria monocytogenes NOT DETECTED NOT DETECTED Final   Staphylococcus species NOT DETECTED NOT DETECTED Final   Staphylococcus aureus NOT DETECTED NOT DETECTED Final   Methicillin resistance NOT DETECTED NOT DETECTED Final   Streptococcus species NOT DETECTED NOT DETECTED Final   Streptococcus agalactiae NOT DETECTED NOT DETECTED Final   Streptococcus pneumoniae NOT DETECTED NOT DETECTED Final   Streptococcus pyogenes DETECTED (A) NOT DETECTED Final    Comment: CRITICAL RESULT CALLED TO, READ BACK BY AND VERIFIED WITH: NATE COOKSON AT 0426 02/27/16 CAF    Acinetobacter baumannii NOT DETECTED NOT DETECTED Final   Enterobacteriaceae species NOT DETECTED NOT DETECTED Final   Enterobacter cloacae complex NOT DETECTED NOT DETECTED Final   Escherichia coli NOT DETECTED NOT DETECTED Final   Klebsiella oxytoca  NOT DETECTED NOT DETECTED Final   Klebsiella pneumoniae NOT DETECTED NOT DETECTED Final   Proteus species NOT DETECTED NOT DETECTED Final    Serratia marcescens NOT DETECTED NOT DETECTED Final   Carbapenem resistance NOT DETECTED NOT DETECTED Final   Haemophilus influenzae NOT DETECTED NOT DETECTED Final   Neisseria meningitidis NOT DETECTED NOT DETECTED Final   Pseudomonas aeruginosa NOT DETECTED NOT DETECTED Final   Candida albicans NOT DETECTED NOT DETECTED Final   Candida glabrata NOT DETECTED NOT DETECTED Final   Candida krusei NOT DETECTED NOT DETECTED Final   Candida parapsilosis NOT DETECTED NOT DETECTED Final   Candida tropicalis NOT DETECTED NOT DETECTED Final  Body fluid culture     Status: None   Collection Time: 03/08/2016  3:05 PM  Result Value Ref Range Status   Specimen Description R Knee  Final   Special Requests NONE  Final   Gram Stain   Final    MODERATE WBC SEEN MODERATE GRAM POSITIVE COCCI IN PAIRS AND CHAINS    Culture   Final    HEAVY GROWTH GROUP A STREP (S.PYOGENES) ISOLATED There is no known Penicillin Resistant Beta Streptococcus in the [place] For patients that are Penicillin-allergic, Erythromycin is 85-94% susceptible, and Clindamycin is 80% susceptible.  Contact Microbiology within 7 days if sensitivity testing is  required.   NO ANAEROBES ISOLATED    Report Status 02/29/2016 FINAL  Final  Body fluid culture     Status: None   Collection Time: 03/11/2016  4:10 PM  Result Value Ref Range Status   Specimen Description R Knee  Final   Special Requests NONE  Final   Gram Stain   Final    MANY WBC SEEN MANY GRAM POSITIVE COCCI IN PAIRS AND CHAINS    Culture   Final    HEAVY GROWTH GROUP A STREP (S.PYOGENES) ISOLATED There is no known Penicillin Resistant Beta Streptococcus in the [place] For patients that are Penicillin-allergic, Erythromycin is 85-94% susceptible, and Clindamycin is 80% susceptible.  Contact Microbiology within 7 days if sensitivity testing is  required.   NO ANAEROBES ISOLATED    Report Status 02/29/2016 FINAL  Final  MRSA PCR Screening     Status: None   Collection Time:  02/27/16  3:35 AM  Result Value Ref Range Status   MRSA by PCR NEGATIVE NEGATIVE Final    Comment:        The GeneXpert MRSA Assay (FDA approved for NASAL specimens only), is one component of a comprehensive MRSA colonization surveillance program. It is not intended to diagnose MRSA infection nor to guide or monitor treatment for MRSA infections.   Urine culture     Status: None   Collection Time: 02/27/16  8:30 AM  Result Value Ref Range Status   Specimen Description URINE, RANDOM  Final   Special Requests NONE  Final   Culture NO GROWTH 1 DAY  Final   Report Status 02/28/2016 FINAL  Final  Culture, blood (single) w Reflex to PCR ID Panel     Status: None   Collection Time: 02/29/16  2:36 PM  Result Value Ref Range Status   Specimen Description BLOOD LEFT HAND  Final   Special Requests BOTTLES DRAWN AEROBIC AND ANAEROBIC [place]  Final   Culture NO GROWTH 5 DAYS  Final   Report Status 03/05/2016 FINAL  Final  C difficile quick scan w PCR reflex     Status: None   Collection Time: 03/06/16  5:37 PM  Result Value  Ref Range Status   C Diff antigen NEGATIVE NEGATIVE Final   C Diff toxin NEGATIVE NEGATIVE Final   C Diff interpretation Negative for C. difficile  Final  Culture, respiratory (NON-Expectorated)     Status: None   Collection Time: 03/07/16  8:20 AM  Result Value Ref Range Status   Specimen Description TRACHEAL ASPIRATE  Final   Special Requests NONE  Final   Gram Stain   Final    MODERATE WBC SEEN FEW SQUAMOUS EPITHELIAL CELLS PRESENT RARE YEAST    Culture Consistent with normal respiratory flora.  Final   Report Status 03/09/2016 FINAL  Final  Culture, respiratory (NON-Expectorated)     Status: None   Collection Time: 03/08/16  4:52 PM  Result Value Ref Range Status   Specimen Description TRACHEAL ASPIRATE  Final   Special Requests NONE  Final   Gram Stain NO ORGANISMS SEEN  Final   Culture Consistent with normal respiratory flora.  Final   Report  Status 03/10/2016 FINAL  Final  Body fluid culture     Status: None   Collection Time: 03/13/16  3:49 PM  Result Value Ref Range Status   Specimen Description SYNOVIAL  Final   Special Requests Immunocompromised  Final   Gram Stain MANY WBC SEEN NO ORGANISMS SEEN   Final   Culture No growth aerobically or anaerobically.  Final   Report Status 03/17/2016 FINAL  Final  CULTURE, BLOOD (ROUTINE X 2) w Reflex to PCR ID Panel     Status: None (Preliminary result)   Collection Time: 03/10/2016 11:55 AM  Result Value Ref Range Status   Specimen Description BLOOD LEFT HAND  Final   Special Requests   Final    BOTTLES DRAWN AEROBIC AND ANAEROBIC  AERO 13CC ANA 5CC   Culture NO GROWTH 2 DAYS  Final   Report Status PENDING  Incomplete  CULTURE, BLOOD (ROUTINE X 2) w Reflex to PCR ID Panel     Status: None (Preliminary result)   Collection Time: 03/22/2016 11:55 AM  Result Value Ref Range Status   Specimen Description BLOOD RIGHT HAND  Final   Special Requests BOTTLES DRAWN AEROBIC AND ANAEROBIC  [place]  Final   Culture NO GROWTH 2 DAYS  Final   Report Status PENDING  Incomplete    Coagulation Studies:  Recent Labs  03/16/16 0931  LABPROT 16.0*  INR 1.27    Urinalysis: No results for input(s): COLORURINE, LABSPEC, PHURINE, GLUCOSEU, HGBUR, BILIRUBINUR, KETONESUR, PROTEINUR, UROBILINOGEN, NITRITE, LEUKOCYTESUR in the last 72 hours.  Invalid input(s): APPERANCEUR    Imaging: Dg Abd 1 View  03/16/2016  CLINICAL DATA:  Evaluate NG tube placement EXAM: ABDOMEN - 1 VIEW COMPARISON:  03/15/2016 FINDINGS: The nasogastric tube is not visualized within the field of view. Gaseous distension of the colon is identified. There is a right groin central venous catheter with tip overlying the expected location of the distal IVC. IMPRESSION: 1. No nasogastric tube is visualized and may be coiled within the upper esophagus. Electronically Signed   By: Kerby Moors M.D.   On: 03/16/2016 13:07   Ct Head Wo  Contrast  03/16/2016  CLINICAL DATA:  75 year old with history of hypertension, chronic diastolic heart failure and diabetes with recent right knee infection requiring drainage and subsequent mechanical ventilation. Unable to wean from ventilator. EXAM: CT HEAD WITHOUT CONTRAST TECHNIQUE: Contiguous axial images were obtained from the base of the skull through the vertex without intravenous contrast. COMPARISON:  CT 03/08/2016 FINDINGS: Images were  reformatted due to head tilt in the gantry. Study is mildly motion degraded. There is no evidence of acute intracranial hemorrhage, mass lesion, brain edema or extra-axial fluid collection. There is stable generalized atrophy with dilatation of the ventricles and subarachnoid spaces. Cavum septum pellucidum and vergae again noted. There are stable chronic small vessel ischemic changes in the basal ganglia and periventricular white matter. No evidence of acute stroke. Prominent intracranial vascular calcifications. Scattered mucosal thickening throughout the paranasal sinuses without definite air-fluid levels. The right maxillary sinus is nearly completely opacified. The calvarium is intact. IMPRESSION: 1. No acute intracranial findings or significant changes from previous recent study demonstrated. 2. Stable generalized atrophy and chronic small vessel ischemic changes. 3. Worsening paranasal sinus opacification. Electronically Signed   By: Richardean Sale M.D.   On: 03/16/2016 14:39   Dg [place]oyce Dys Tube Plc W/fl W/rad  03/15/2016  CLINICAL DATA:  Sepsis, placement of feeding tube under fluoroscopy. EXAM: NASO G TUBE PLACEMENT WITH FL AND WITH RAD FLUOROSCOPY TIME:  Radiation Exposure Index (as provided by the fluoroscopic device): 0.8 mGy COMPARISON:  None. FINDINGS: Real-time fluoroscopy was utilized for placement of a Dobhoff tube. The tube was inserted through the right nare and advanced. The tube continually coiled in the mouth and could not be advanced beyond the  base of the tongue. 5 attempts were made in total. The examination was aborted at this time. IMPRESSION: Unsuccessful placement of a Dobhoff tube under fluoroscopy. Electronically Signed   By: Kathreen Devoid   On: 03/15/2016 17:14     Medications:   . Marland KitchenTPN (CLINIMIX-E) Adult 95 mL/hr at 03/16/16 2100  . Marland KitchenTPN (CLINIMIX-E) Adult    . alteplase    . dexmedetomidine Stopped (03/16/16 1136)  . insulin (NOVOLIN-R) infusion 1.3 Units/hr (03/17/16 1115)  . phenylephrine (NEO-SYNEPHRINE) Adult infusion Stopped (03/15/16 1258)  . pureflow 3 each (03/15/16 0752)  . vasopressin (PITRESSIN) infusion - *FOR SHOCK* Stopped (03/15/16 1230)   . antiseptic oral rinse  7 mL Mouth Rinse QID  . aspirin  325 mg Per Tube Daily  . budesonide (PULMICORT) nebulizer solution  0.25 mg Nebulization BID  . chlorhexidine gluconate (SAGE KIT)  15 mL Mouth Rinse BID  . famotidine (PEPCID) IV  20 mg Intravenous Q48H  . ipratropium-albuterol  3 mL Nebulization Q6H  . pencillin G potassium IV  2 Million Units Intravenous Q6H  . pravastatin  40 mg Per Tube q1800  . sodium chloride flush  10-40 mL Intracatheter Q12H   acetaminophen **OR** [DISCONTINUED] acetaminophen, albuterol, alteplase, fentaNYL (SUBLIMAZE) injection, heparin, midazolam, ondansetron **OR** ondansetron (ZOFRAN) IV, pencillin G potassium IV, sodium chloride flush  Assessment/ Plan:  75 y.o. white  male  with coronary disease with four-vessel CABG in 1998, gallbladder rupture,  Diabetes with complications of retinopathy, peripheral neuropathy, peripheral vascular disease, Aorto iliac bypass, angioplasty and stent in his left leg, kyphoplasty for chronic back pain, CKD st 3 with Baseline Cr 1.5/ GFR 46  1. Acute renal failure on chronic kidney disease stage III: baseline creatinine of 1.5, eGFR of 46. Chronic kidney disease secondary to diabetic nephropathy. Required CRRT. Now transitioned to intermittent hemodialysis. Treatment yesterday and today.  Nonoliguric urine output.  Complicated acute renal failure with Anasarca, hypotension, metabolic acidosis, hyponatremia - Plan to hold dialysis tomorrow and monitor for recovery.   2. Rt Knee septic arthritis with sepsis: strep pyogenes. Appreciate ortho and infectious disease input.  - penicillin G   3. Acute resp faliure. Intubated 4/3, extubated and re-intubated  4/13. Now with tracheostomy 4/17.  - PEG as per primary team  4. Anemia with kidney failure: status post transfusion 4/16. Platelets continues to be low.     LOS: [place], [place] 4/22/201711:39 AM

## 2016-03-17 NOTE — Progress Notes (Signed)
Nutrition Follow-up  DOCUMENTATION CODES:   Not applicable  INTERVENTION:   -Coordination of Care: Rounded with MDs Kasa and Kolloru, both agreeable to continue current PN regimen of 5%AA/20%Dextrose at goal rate of 15mL/hr at this time. Pt scheduled for HD today per Dr Abigail Butts, to be assessed daily. Insulin drip started for blood glucose management. Pharmacy following for electrolytes. Will continue to follow and assess. PN: 5%AA/20% Dextrose (most concentrated TPN formula available on hospital formulary) goal rate of 95 ml/hr providing 114 g of protein, 2006 kcals, 2280 mL of fluid.   NUTRITION DIAGNOSIS:   Inadequate oral intake related to acute illness as evidenced by NPO status.  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, respiratory distress with severe acidosis requiring intubation on 02/27/16  Pt remains intubated on the vent via trach this am.  Patient is currently intubated on ventilator support MV: 8 L/min Temp (24hrs), Avg:99.3 F (37.4 C), Min:98.3 F (36.8 C), Max:100 F (37.8 C)   Pt s/p HD yesterday, scheduled for HD again today.  UOP in past 24 hours improved: 1677mL   RN unable to place NG tube yesterday. Multiple failed attempts at placing per chart review. Surgeon reports pt not a candidate for surgical gastrostomy currently. TPN infusing via dedicated port in PICC line.  Diet Order:  .TPN (CLINIMIX-E) Adult .TPN (CLINIMIX-E) Adult  Skin:   (Stage I buttock, Stage II sacral pressure ulcer)  Gastrointestinal Profile: Last BM:  03/17/2016 watery loose stool via rectal tube   Medications: Insulin drip Labs: Glucose 140 this am, Mg elevated, BUN 23, Cre WDL    Weight Trend since Admission: Filed Weights   03/16/16  0621 03/17/16 0751 03/17/16 1120  Weight: 199 lb 11.8 oz (90.6 kg) 201 lb 11.5 oz (91.5 kg) 203 lb 14.8 oz (92.5 kg)   RD notes 203lbs pre-HD weight documented this am. Will continue to follow.  Ideal Body Weight:  80.9 kg  BMI:  Body mass index is 27.35 kg/(m^2).  Estimated Nutritional Needs:   Kcal:  2089 kcals   Protein:  128-170 g/kg  Fluid:  >1.9 L  EDUCATION NEEDS:   Education needs no appropriate at this time  Dwyane Luo, RD, LDN Pager [phone] Weekend/On-Call Pager [phone]

## 2016-03-17 NOTE — Progress Notes (Signed)
Placed pt back on PRVC (rest mode) per NP. Pt ol wean will and will resume weaning in the a.m. If possible.

## 2016-03-17 NOTE — Progress Notes (Signed)
PRE HD TX 

## 2016-03-17 NOTE — Progress Notes (Signed)
LCSW met with patients wife in waiting area and provided 1-1 individual and therapeutic support. LCSW listened as patient reviewed the roller coaster the family has experienced. Wife acknowledges he is a strong man and they together will handle day by day. LCSW will check in with family tomorrow. [employer] LCSW [phone]

## 2016-03-17 NOTE — Progress Notes (Signed)
PULMONARY / CRITICAL CARE MEDICINE   Name: LISA RASK MRN: [id number] DOB: [birth year]    ADMISSION DATE:  03/15/2016  BRIEF HISTORY: 75 year old male past medical history of hypertension, chronic diastolic heart failure, CK, diabetes, presented on April 2 with four-day history of progressive right knee pain fever, shortness of breath, cough. Within 24 hours decompensated requiring intubation on 04/03.  Seen by orthopedics, had drain placed in right knee with purulent material. Now with mechanical ventilation and unable to weaned off after failed extubation 04/13 2/2  stridor and poor cough.   SUBJECTIVE:  No acute issues overnight. Off all sedation. Did not sleep; stares but does not follow commands. Tolerated PSV.  IR to re-attempt PEG. Failed previous trial due to abnormal anatomy.    SIGNIFICANT EVENTS/STUDIES: 04/02 admitted by Hospitalist Service with 4 day history of right knee pain - swollen, erythematous and tender on exam - and severe sepsis, AKI, abnormal EKG. Cardiac markers positive (peak troponin I 8.10) 04/03 Orthopedics consultation and aspiration of R Knee, with drain placement 04/03 ID consultation 04/03 Nephrology consultation 04/04 TTE: LVEF 60-65%, no vegetations 04/04 altered mental status, worsening respiratory status, septic, intubated 04/04 R knee hemovac removed by ortho 04/06 Cardiology consultation: NSTEMI - deemed secondary to demand ischemia. Further W/U to be considered after resolution of severe sepsis and critical illness 04/09 CRRT initiated  04/10 agitated on WUA. Failed SBT. Off and on norepienphrine 04/11 Tolerating volume removal by CRRT. Failed SBT. Tolerated PS 10-14 cm H2O 04/12 Failed SBT. Agitated on WUA. Uo improving. Started back on fentanyl gtt in PM 04/13 LE venous US:  04/13 Passed SBT. Cognition remains marginal. Extubated. Post extubation stridor and poor cough. BiPAP initiated. High risk of re-intubation 04/13 Re-intubated in  afternoon. ENT consult for trach tube placement - planned 04/17.  04/13 CT head: diffuse atrophy, no acute disease 04/14 CRRT resumed due to hypervolemia 4/17 trach by ENT 4/18 started back on levophed 4/19 PEG by GI attempted, unsuccessful 4/21 NG placed  INDWELLING DEVICES:: R IJ CVL 04/03 >> 4/18 R arm PICC 4/18>> ETT 04/03 >> 04/13, 04/13 >>  R femoral HD cath 04/09 >>   MICRO DATA: 04/02 MRSA PCR >> NEG 04/02 flu screen >> NEG 04/02 blood >> Strep pyogenes 04/02 knee aspirate >> Strep pyogenes 04/05 blood >> NEG 04/11 C diff >> NEG 04/12 Resp >> NOF 04/13 Resp >> normal respiratory flora 04/18 R knee aspirate>>  PCT algorithm 04/03: 22.50 > 16.65 > 7.12 PCT algorithm 04/12: 0.62 > 0.57 > 2.66  ANTIMICROBIALS:  Ceftriaxone 04/02 X 1  Vanc 04/02 >> 04/03 Pip-tazo 04/02 >> 04/03 Clinda 04/03 >> 04/07 Pen G 04/03 >>    VITAL SIGNS: Temp:  [99.2 F (37.3 C)-99.5 F (37.5 C)] 99.3 F (37.4 C) (04/21 2000) Pulse Rate:  [81-124] 103 (04/22 0500) Resp:  [21-38] 23 (04/22 0500) BP: (109-165)/(51-94) 125/55 mmHg (04/22 0500) SpO2:  [97 %-100 %] 100 % (04/22 0500) FiO2 (%):  [28 %] 28 % (04/22 0406) Weight:  [199 lb 11.8 oz (90.6 kg)] 199 lb 11.8 oz (90.6 kg) (04/21 0621) HEMODYNAMICS:   VENTILATOR SETTINGS: Vent Mode:  [-] PRVC FiO2 (%):  [28 %] 28 % Set Rate:  [16 bmp] 16 bmp Vt Set:  [500 mL] 500 mL PEEP:  [5 cmH20] 5 cmH20 Pressure Support:  [12 cmH20-17 cmH20] 17 cmH20 INTAKE / OUTPUT:  Intake/Output Summary (Last 24 hours) at 03/17/16 0556 Last data filed at 03/17/16 0455  Gross per 24 hour  Intake 984.11 ml  Output   1975 ml  Net -990.89 ml    Review of Systems  Unable to perform ROS: intubated    Physical Exam  Constitutional:  RASS -3, not F/C; NAD  HENT:  Head: Normocephalic and atraumatic.  Eyes: EOM are normal. Pupils are equal, round, and reactive to light.  Cardiovascular: Regular rhythm and normal heart sounds.   No murmur  heard. Pulmonary/Chest: No respiratory distress. He has no wheezes. He has no rales.  Abdominal: Soft. Bowel sounds are normal. He exhibits no distension. There is no tenderness.  Musculoskeletal:  R>L LE edema  Neurological: He has normal reflexes.  lethargic   LABS:  CBC  Recent Labs Lab 03/15/16 0341 03/16/16 0458 03/16/16 0646  WBC 3.7* 3.4* 4.2  HGB 8.1* 7.2* 8.1*  HCT 24.6* 25.4* 24.3*  PLT 65* 77* 84*   Coag's  Recent Labs Lab 03/16/16 0931  APTT 39*  INR 1.27   BMET  Recent Labs Lab 03/15/16 0340 03/16/16 0458 03/16/16 0645  NA 140 129* 138  K 4.3 6.0* 4.1  CL 108 97* 104  CO2 29 26 30   BUN 10 13 14   CREATININE 0.66 1.22 1.07  GLUCOSE 135* 1565* 257*   Electrolytes  Recent Labs Lab 03/15/16 0340  03/16/16 0458 03/16/16 0645 03/16/16 0931 03/17/16 0450  CALCIUM 8.3*  --  7.6* 7.9*  --   --   MG  --   < > 2.2  --  2.0 2.7*  PHOS 2.9  --  6.3*  --  2.6  --   < > = values in this interval not displayed. Sepsis Markers No results for input(s): LATICACIDVEN, PROCALCITON, O2SATVEN in the last 168 hours. ABG  Recent Labs Lab 03/03/2016 0500 03/13/16 0800 03/16/16 0845  PHART 7.50* 7.41 7.50*  PCO2ART 35 38 41  PO2ART 129* 89 79*   Liver Enzymes  Recent Labs Lab 03/17/2016 2236 03/15/16 0340 03/16/16 0458  AST  --   --  27  ALT  --   --  13*  ALKPHOS  --   --  113  BILITOT  --   --  0.9  ALBUMIN 1.9* 1.9* 1.5*   Cardiac Enzymes No results for input(s): TROPONINI, PROBNP in the last 168 hours. Glucose  Recent Labs Lab 03/16/16 2258 03/17/16 0127 03/17/16 0201 03/17/16 0258 03/17/16 0421 03/17/16 0515  GLUCAP 182* 214* 211* 182* 146* 145*    CXR: images from 4/16  Continued bilateral airspace opacities with persistent perihilar and lower lobe airspace disease Dg Abd 1 View  03/16/2016  CLINICAL DATA:  Evaluate NG tube placement EXAM: ABDOMEN - 1 VIEW COMPARISON:  03/15/2016 FINDINGS: The nasogastric tube is not visualized  within the field of view. Gaseous distension of the colon is identified. There is a right groin central venous catheter with tip overlying the expected location of the distal IVC. IMPRESSION: 1. No nasogastric tube is visualized and may be coiled within the upper esophagus. Electronically Signed   By: Kerby Moors M.D.   On: 03/16/2016 13:07   Ct Head Wo Contrast  03/16/2016  CLINICAL DATA:  75 year old with history of hypertension, chronic diastolic heart failure and diabetes with recent right knee infection requiring drainage and subsequent mechanical ventilation. Unable to wean from ventilator. EXAM: CT HEAD WITHOUT CONTRAST TECHNIQUE: Contiguous axial images were obtained from the base of the skull through the vertex without intravenous contrast. COMPARISON:  CT 03/08/2016 FINDINGS: Images were reformatted due to head tilt in  the gantry. Study is mildly motion degraded. There is no evidence of acute intracranial hemorrhage, mass lesion, brain edema or extra-axial fluid collection. There is stable generalized atrophy with dilatation of the ventricles and subarachnoid spaces. Cavum septum pellucidum and vergae again noted. There are stable chronic small vessel ischemic changes in the basal ganglia and periventricular white matter. No evidence of acute stroke. Prominent intracranial vascular calcifications. Scattered mucosal thickening throughout the paranasal sinuses without definite air-fluid levels. The right maxillary sinus is nearly completely opacified. The calvarium is intact. IMPRESSION: 1. No acute intracranial findings or significant changes from previous recent study demonstrated. 2. Stable generalized atrophy and chronic small vessel ischemic changes. 3. Worsening paranasal sinus opacification. Electronically Signed   By: Richardean Sale M.D.   On: 03/16/2016 14:39     DISCUSSION: 75 year old male with septic right knee, developed sepsis/renal failure requiring CRRT, also requiring intubation,  now status post tracheostomy on 03/21/2016. Delirium/agitation, and poor urine output, along with inability to completely wean off ventilator are limiting factors to his overall clinical improvement.  ASSESSMENT / PLAN:  PULMONARY Acute vent dep respiratory failure - s/p trach Pulm edema pattern on CXR Failed extubation 04/13 S/P trach 4/17 by ENT P: Cont vent support - settings reviewed and/or adjusted Cont vent bundle Daily SBT,  and sedation vacation;  S/p trach 4/17-trach care per protocol PSV as tolerated; no immediate plan to downside trach  CARDIOVASCULAR Septic shock, resolved NSTEMI PAF P:  MAP goal > 60 mmHg PRN phenylephrine/vasopressin to keep MAP>60 Cont ASA and statin Further cardiac w/u after critical illness resolves per cardiology  RENAL AKI Severe hypervolemia P: Monitor BMET intermittently Monitor I/Os Correct electrolytes as indicated Renal Service following HD per nephrology; last HD 4/21  GASTROINTESTINAL Diarrhea  Hemoccult positive  FEN P: SUP: enteral famotidine Cont TFs Monitor for bleeding GI attempted PEG on 4/19, unsuccessful IR attempted PEG on 4/19, unable to perform due to abnormal anatomy Surgery consulted for PEG eval Fluoro consult for NGT placement.  TPN started    HEMATOLOGIC Anemia without overt bleeding Thrombocytopenia  P: DVT px: SCDs (SQ heparin DC'd 04/12) Monitor CBC intermittently Transfuse per usual guidelines  INFECTIOUS Septic arthritis of the R knee Purulent ET secretions - much improved, now with Trach Small increase in PCT 04/14 - unclear significance P: Monitor temp, WBC count Micro and abx as above ID service following -  RIJ removed, R arm PICC placed.    ENDOCRINE DM 2, controlled Hyperglycemia improved P: Cont Lantus Cont SSI  NEUROLOGIC ICU acquired delirium - hypoactive, CT head negative Profound deconditioning P: RASS goal: 0, -1 Off sedation; monitor Monitor for  bradycardia PRN fentanyl Willl need extensive rehab if he survives this acute phase of illness Best Practice: Code Status:  Full. Diet:NPO/TFs GI prophylaxis:  PPI. VTE prophylaxis:  SCD's / no heparin as patient gets heparin with dialysis  Total time 35 minutes Keysha Damewood S. [facility] ANP-BC Pulmonary and [place] Pager [phone]  03/17/2016 @ [phone]

## 2016-03-18 ENCOUNTER — Inpatient Hospital Stay: Payer: Commercial Managed Care - HMO

## 2016-03-18 LAB — RENAL FUNCTION PANEL
ANION GAP: 4 — AB (ref 5–15)
Albumin: 1.4 g/dL — ABNORMAL LOW (ref 3.5–5.0)
Albumin: 1.2 g/dL — ABNORMAL LOW (ref 3.5–5.0)
Anion gap: 8 (ref 5–15)
BUN: 30 mg/dL — ABNORMAL HIGH (ref 6–20)
BUN: 25 mg/dL — ABNORMAL HIGH (ref 6–20)
CHLORIDE: 97 mmol/L — AB (ref 101–111)
CHLORIDE: 101 mmol/L (ref 101–111)
CO2: 27 mmol/L (ref 22–32)
CO2: 30 mmol/L (ref 22–32)
CREATININE: 1.06 mg/dL (ref 0.61–1.24)
Calcium: 7.2 mg/dL — ABNORMAL LOW (ref 8.9–10.3)
Calcium: 7.1 mg/dL — ABNORMAL LOW (ref 8.9–10.3)
Creatinine, Ser: 0.92 mg/dL (ref 0.61–1.24)
GFR calc non Af Amer: >60 mL/min (ref >60)
Glucose, Bld: 424 mg/dL — ABNORMAL HIGH (ref 65–99)
Glucose, Bld: 172 mg/dL — ABNORMAL HIGH (ref 65–99)
POTASSIUM: 6.1 mmol/L — AB (ref 3.5–5.1)
POTASSIUM: 3.8 mmol/L (ref 3.5–5.1)
Phosphorus: 3 mg/dL (ref 2.5–4.6)
Phosphorus: 2.3 mg/dL — ABNORMAL LOW (ref 2.5–4.6)
Sodium: 132 mmol/L — ABNORMAL LOW (ref 135–145)
Sodium: 135 mmol/L (ref 135–145)

## 2016-03-18 LAB — GLUCOSE, CAPILLARY
GLUCOSE-CAPILLARY: 106 mg/dL — AB (ref 65–99)
GLUCOSE-CAPILLARY: 121 mg/dL — AB (ref 65–99)
GLUCOSE-CAPILLARY: 125 mg/dL — AB (ref 65–99)
GLUCOSE-CAPILLARY: 149 mg/dL — AB (ref 65–99)
GLUCOSE-CAPILLARY: 167 mg/dL — AB (ref 65–99)
GLUCOSE-CAPILLARY: 286 mg/dL — AB (ref 65–99)
GLUCOSE-CAPILLARY: 54 mg/dL — AB (ref 65–99)
GLUCOSE-CAPILLARY: 57 mg/dL — AB (ref 65–99)
GLUCOSE-CAPILLARY: 70 mg/dL (ref 65–99)
GLUCOSE-CAPILLARY: 73 mg/dL (ref 65–99)
GLUCOSE-CAPILLARY: 92 mg/dL (ref 65–99)
GLUCOSE-CAPILLARY: 95 mg/dL (ref 65–99)
Glucose-Capillary: 112 mg/dL — ABNORMAL HIGH (ref 65–99)
Glucose-Capillary: 206 mg/dL — ABNORMAL HIGH (ref 65–99)
Glucose-Capillary: 239 mg/dL — ABNORMAL HIGH (ref 65–99)
Glucose-Capillary: 286 mg/dL — ABNORMAL HIGH (ref 65–99)
Glucose-Capillary: 275 mg/dL — ABNORMAL HIGH (ref 65–99)
Glucose-Capillary: 265 mg/dL — ABNORMAL HIGH (ref 65–99)
Glucose-Capillary: 52 mg/dL — ABNORMAL LOW (ref 65–99)
Glucose-Capillary: 72 mg/dL (ref 65–99)
Glucose-Capillary: 77 mg/dL (ref 65–99)
Glucose-Capillary: 139 mg/dL — ABNORMAL HIGH (ref 65–99)
Glucose-Capillary: 158 mg/dL — ABNORMAL HIGH (ref 65–99)
Glucose-Capillary: 153 mg/dL — ABNORMAL HIGH (ref 65–99)
Glucose-Capillary: 177 mg/dL — ABNORMAL HIGH (ref 65–99)

## 2016-03-18 LAB — HEPATITIS B CORE ANTIBODY, TOTAL: HEP B C TOTAL AB: NEGATIVE

## 2016-03-18 LAB — HEPATITIS B SURFACE ANTIBODY, QUANTITATIVE: Hepatitis B-Post: <3.1 m[IU]/mL — ABNORMAL LOW (ref >9.9)

## 2016-03-18 LAB — PREALBUMIN: PREALBUMIN: 2.7 mg/dL — AB (ref 18–38)

## 2016-03-18 LAB — HEPATITIS C ANTIBODY: HCV Ab: <0.1 s/co ratio (ref 0.0–0.9)

## 2016-03-18 LAB — MAGNESIUM: Magnesium: 1.6 mg/dL — ABNORMAL LOW (ref 1.7–2.4)

## 2016-03-18 MED ORDER — DEXTROSE 50 % IV SOLN
1.0000 | INTRAVENOUS | Status: AC
  Administered 2016-03-18: 50 mL via INTRAVENOUS

## 2016-03-18 MED ORDER — DEXTROSE 50 % IV SOLN
1.0000 | INTRAVENOUS | Status: AC
  Administered 2016-03-18: 50 mL via INTRAVENOUS
  Filled 2016-03-18: qty 50

## 2016-03-18 MED ORDER — INSULIN REGULAR HUMAN 100 UNIT/ML IJ SOLN
10.0000 [IU] | Freq: Once | INTRAMUSCULAR | Status: AC
  Administered 2016-03-18: 10 [IU] via INTRAVENOUS
  Filled 2016-03-18: qty 0.1

## 2016-03-18 MED ORDER — DEXTROSE 50 % IV SOLN
50.0000 mL | Freq: Once | INTRAVENOUS | Status: AC
  Administered 2016-03-18: 50 mL via INTRAVENOUS
  Filled 2016-03-18: qty 50

## 2016-03-18 MED ORDER — AMIODARONE HCL IN DEXTROSE 360-4.14 MG/200ML-% IV SOLN
60.0000 mg/h | INTRAVENOUS | Status: DC
  Filled 2016-03-18: qty 200

## 2016-03-18 MED ORDER — DEXTROSE 50 % IV SOLN
INTRAVENOUS | Status: AC
  Administered 2016-03-18: 50 mL via INTRAVENOUS
  Filled 2016-03-18: qty 50

## 2016-03-18 MED ORDER — NOREPINEPHRINE 4 MG/250ML-% IV SOLN
INTRAVENOUS | Status: AC
Start: 2016-03-18 — End: 2016-03-18
  Administered 2016-03-18: 8 mg
  Filled 2016-03-18: qty 250

## 2016-03-18 MED ORDER — ACETAMINOPHEN 650 MG RE SUPP: 650.0000 mg | Freq: Four times a day (QID) | RECTAL | Status: DC | PRN

## 2016-03-18 MED ORDER — DEXTROSE 10 % IV SOLN
INTRAVENOUS | Status: DC
  Administered 2016-03-18: 14:00:00 via INTRAVENOUS

## 2016-03-18 MED ORDER — LORAZEPAM 2 MG/ML IJ SOLN
2.0000 mg | Freq: Once | INTRAMUSCULAR | Status: DC
  Filled 2016-03-18: qty 1

## 2016-03-18 MED ORDER — AMIODARONE HCL IN DEXTROSE 360-4.14 MG/200ML-% IV SOLN
30.0000 mg/h | INTRAVENOUS | Status: DC
  Filled 2016-03-18: qty 200

## 2016-03-18 MED ORDER — NOREPINEPHRINE BITARTRATE 1 MG/ML IV SOLN
0.0000 ug/min | INTRAVENOUS | Status: DC
  Administered 2016-03-18: 5 ug/min via INTRAVENOUS
  Filled 2016-03-18 (×2): qty 16

## 2016-03-18 NOTE — Progress Notes (Signed)
Pre tx

## 2016-03-18 NOTE — Progress Notes (Signed)
Dr. Juleen China present and aware that patient's blood glucose is 70 and has been given 2 amps of d50 since 1105.  MD gave order for d10 at 25 cc/H

## 2016-03-18 NOTE — Progress Notes (Signed)
Patient resting at this time but arouses to voice. NSR-Stach with 1st degree heart block per cardiac monitor rate 99-low 100's.  Blood pressure stable on levophed drip.  Blood glucose stable on D10.  Tachypnic at times with RR 18-30.  Wife at bedside.

## 2016-03-18 NOTE — Progress Notes (Addendum)
PULMONARY / CRITICAL CARE MEDICINE   Name: TEGAN DEMAN MRN: [id number] DOB: 06-22-1941    ADMISSION DATE:  03/15/2016  BRIEF HISTORY: 75 year old male past medical history of hypertension, chronic diastolic heart failure, CK, diabetes, presented on April 2 with four-day history of progressive right knee pain fever, shortness of breath, cough. Within 24 hours decompensated requiring intubation on 04/03.  Seen by orthopedics, had drain placed in right knee with purulent material. Now with mechanical ventilation and unable to weaned off after failed extubation 04/13 2/2  stridor and poor cough.   SUBJECTIVE:  No acute issues overnight. Tolerated PSV till midnight and replaced on PRVC.  IR to re-attempt PEG.   SIGNIFICANT EVENTS/STUDIES: 04/02 admitted by Hospitalist Service with 4 day history of right knee pain - swollen, erythematous and tender on exam - and severe sepsis, AKI, abnormal EKG. Cardiac markers positive (peak troponin I 8.10) 04/03 Orthopedics consultation and aspiration of R Knee, with drain placement 04/03 ID consultation 04/03 Nephrology consultation 04/04 TTE: LVEF 60-65%, no vegetations 04/04 altered mental status, worsening respiratory status, septic, intubated 04/04 R knee hemovac removed by ortho 04/06 Cardiology consultation: NSTEMI - deemed secondary to demand ischemia. Further W/U to be considered after resolution of severe sepsis and critical illness 04/09 CRRT initiated  04/10 agitated on WUA. Failed SBT. Off and on norepienphrine 04/11 Tolerating volume removal by CRRT. Failed SBT. Tolerated PS 10-14 cm H2O 04/12 Failed SBT. Agitated on WUA. Uo improving. Started back on fentanyl gtt in PM 04/13 LE venous US:  04/13 Passed SBT. Cognition remains marginal. Extubated. Post extubation stridor and poor cough. BiPAP initiated. High risk of re-intubation 04/13 Re-intubated in afternoon. ENT consult for trach tube placement - planned 04/17.  04/13 CT head: diffuse  atrophy, no acute disease 04/14 CRRT resumed due to hypervolemia 4/17 trach by ENT 4/18 started back on levophed 4/19 PEG by GI attempted, unsuccessful 4/21 NG placed  INDWELLING DEVICES:: R IJ CVL 04/03 >> 4/18 R arm PICC 4/18>> ETT 04/03 >> 04/13, 04/13 >>  R femoral HD cath 04/09 >>   MICRO DATA: 04/02 MRSA PCR >> NEG 04/02 flu screen >> NEG 04/02 blood >> Strep pyogenes 04/02 knee aspirate >> Strep pyogenes 04/05 blood >> NEG 04/11 C diff >> NEG 04/12 Resp >> NOF 04/13 Resp >> normal respiratory flora 04/18 R knee aspirate>>  PCT algorithm 04/03: 22.50 > 16.65 > 7.12 PCT algorithm 04/12: 0.62 > 0.57 > 2.66  ANTIMICROBIALS:  Ceftriaxone 04/02 X 1  Vanc 04/02 >> 04/03 Pip-tazo 04/02 >> 04/03 Clinda 04/03 >> 04/07 Pen G 04/03 >>    VITAL SIGNS: Temp:  [99 F (37.2 C)-100 F (37.8 C)] 99.8 F (37.7 C) (04/23 0400) Pulse Rate:  [101-120] 107 (04/23 0400) Resp:  [13-33] 23 (04/23 0400) BP: (79-138)/(47-96) 90/63 mmHg (04/23 0400) SpO2:  [98 %-100 %] 100 % (04/23 0400) FiO2 (%):  [28 %] 28 % (04/23 0300) Weight:  [201 lb 11.5 oz (91.5 kg)-203 lb 14.8 oz (92.5 kg)] 203 lb 7.8 oz (92.3 kg) (04/22 1400) HEMODYNAMICS:   VENTILATOR SETTINGS: Vent Mode:  [-] PRVC FiO2 (%):  [28 %] 28 % Set Rate:  [16 bmp] 16 bmp Vt Set:  [500 mL] 500 mL PEEP:  [5 cmH20] 5 cmH20 Plateau Pressure:  [16 cmH20-20 cmH20] 16 cmH20 INTAKE / OUTPUT:  Intake/Output Summary (Last 24 hours) at 03/18/16 0428 Last data filed at 03/18/16 0400  Gross per 24 hour  Intake 3647.11 ml  Output   1444 ml  Net 2203.11 ml    Review of Systems  Unable to perform ROS: intubated    Physical Exam  Constitutional:  RASS -3, not F/C; NAD  HENT:  Head: Normocephalic and atraumatic.  Eyes: EOM are normal. Pupils are equal, round, and reactive to light.  Cardiovascular: Regular rhythm and normal heart sounds.   No murmur heard. Pulmonary/Chest: No respiratory distress. He has no wheezes. He has no  rales.  Abdominal: Soft. Bowel sounds are normal. He exhibits no distension. There is no tenderness.  Musculoskeletal:  R>L LE edema  Neurological: He has normal reflexes.  lethargic   LABS:  CBC  Recent Labs Lab 03/15/16 0341 03/16/16 0458 03/16/16 0646  WBC 3.7* 3.4* 4.2  HGB 8.1* 7.2* 8.1*  HCT 24.6* 25.4* 24.3*  PLT 65* 77* 84*   Coag's  Recent Labs Lab 03/16/16 0931  APTT 39*  INR 1.27   BMET  Recent Labs Lab 03/16/16 0458 03/16/16 0645 03/17/16 0612  NA 129* 138 139  K 6.0* 4.1 3.9  CL 97* 104 103  CO2 26 30 31   BUN 13 14 23*  CREATININE 1.22 1.07 0.95  GLUCOSE 1565* 257* 140*   Electrolytes  Recent Labs Lab 03/16/16 0458 03/16/16 0645 03/16/16 0931 03/17/16 0450 03/17/16 0612  CALCIUM 7.6* 7.9*  --   --  8.1*  MG 2.2  --  2.0 2.7*  --   PHOS 6.3*  --  2.6  --  3.6   Sepsis Markers No results for input(s): LATICACIDVEN, PROCALCITON, O2SATVEN in the last 168 hours. ABG  Recent Labs Lab 03/04/2016 0500 03/13/16 0800 03/16/16 0845  PHART 7.50* 7.41 7.50*  PCO2ART 35 38 41  PO2ART 129* 89 79*   Liver Enzymes  Recent Labs Lab 03/15/16 0340 03/16/16 0458 03/17/16 0612  AST  --  27  --   ALT  --  13*  --   ALKPHOS  --  113  --   BILITOT  --  0.9  --   ALBUMIN 1.9* 1.5* 1.7*   Cardiac Enzymes No results for input(s): TROPONINI, PROBNP in the last 168 hours. Glucose  Recent Labs Lab 03/17/16 2307 03/18/16 0003 03/18/16 0118 03/18/16 0200 03/18/16 0256 03/18/16 0357  GLUCAP 102* 92 112* 125* 206* 239*    CXR: images from 4/16  Continued bilateral airspace opacities with persistent perihilar and lower lobe airspace disease No results found.   DISCUSSION: 75 year old male with septic right knee, developed sepsis/renal failure requiring CRRT, also requiring intubation, now status post tracheostomy on 03/22/2016. Delirium/agitation, and poor urine output, along with inability to completely wean off ventilator are limiting  factors to his overall clinical improvement.  ASSESSMENT / PLAN:  PULMONARY Acute vent dep respiratory failure - s/p trach Pulm edema pattern on CXR Failed extubation 04/13 S/P trach 4/17 by ENT P: Cont vent support - settings reviewed and/or adjusted Cont vent bundle Daily SBT,  and sedation vacation;  S/p trach 4/17-trach care per protocol PSV as tolerated; no immediate plan to downsize trach  CARDIOVASCULAR Septic shock, resolved NSTEMI PAF P:  MAP goal > 60 mmHg Off phenylephrine/vasopressin  Cont ASA and statin Further cardiac w/u after critical illness resolves per cardiology  RENAL AKI Severe hypervolemia P: Monitor BMET intermittently Monitor I/Os Correct electrolytes as indicated Renal Service following HD per nephrology; last HD 4/21  GASTROINTESTINAL Diarrhea  Hemoccult positive  FEN P: SUP: enteral famotidine Cont TFs Monitor for bleeding GI attempted PEG on 4/19, unsuccessful IR attempted PEG on 4/19, unable  to perform due to abnormal anatomy Surgery consulted for PEG eval Fluoro consult for NGT placement.  Continue TPN    HEMATOLOGIC Anemia without overt bleeding Thrombocytopenia  P: DVT px: SCDs (SQ heparin DC'd 04/12) Monitor CBC intermittently Transfuse per usual guidelines  INFECTIOUS Septic arthritis of the R knee Purulent ET secretions - much improved, now with Trach Small increase in PCT 04/14 - unclear significance P: Monitor temp, WBC count Micro and abx as above ID service following -  RIJ removed, R arm PICC placed.   ENDOCRINE DM 2, controlled Hyperglycemia improved P: Cont Lantus Cont SSI  NEUROLOGIC ICU acquired delirium - hypoactive, CT head negative Profound deconditioning P: RASS goal: 0, -1 Off sedation; monitor Monitor for bradycardia PRN fentanyl Willl need extensive rehab if he survives this acute phase of illness  Best Practice: Code Status:  Full. Diet:NPO/TFs GI prophylaxis:  PPI. VTE  prophylaxis:  SCD's / no heparin as patient gets heparin with dialysis  Total time 35 minutes  Magdalene S. [facility] ANP-BC Pulmonary and [place] Pager [phone]  03/18/2016 @ [phone]  STAFF NOTE: I, Dr. Corrin Parker,  have personally reviewed patient's available data, including medical history, events of note, physical examination and test results as part of my evaluation. I have discussed with NP and other care providers such as pharmacist, RN and RRT.  In addition,  I personally evaluated patient and elicited key findings  +rhonchi, diaphoretic   A:resp failure s/p trach  P:wean to PS mode    Awaiting for wife to update  [facility] per NP whose note is outlined above and that I agree with  I have personally reviewed/obtained a history, examined the patient, evaluated Pertinent laboratory and RadioGraphic/imaging results, and  formulated the assessment and plan   The Patient requires high complexity decision making for assessment and support, frequent evaluation and titration of therapies, application of advanced monitoring technologies and extensive interpretation of multiple databases. Critical Care Time devoted to patient care services described in this note is 35 minutes.  This Critical care time does not reflrect procedure time or supervisory time of NP but could involve care discussion time Overall, patient is critically ill, prognosis is guarded.  Patient with Multiorgan failure and at high risk for cardiac arrest and death.    Corrin Parker, M.D.  [facility]  Medical Director [place] Director [facility] Cardio-Pulmonary Department

## 2016-03-18 NOTE — Progress Notes (Signed)
Blood glucose at 1100 was 57 therefore amp of d50 given  Per protocol. Rechecked at this time and is 72.  Will continue to monitor. Insulin drip off.  Patient receiving diaylsis at this time.

## 2016-03-18 NOTE — Progress Notes (Signed)
Dr. Juleen China present and gave order to stop TPN due to K+ being high this morning.

## 2016-03-18 NOTE — Progress Notes (Signed)
PARENTERAL NUTRITION CONSULT NOTE -FOLLOW UP   Pharmacy Consult for Electrolyte and Glucose Monitoring  Indication: TPN  Allergies  Allergen Reactions  . Tape Rash    Patient Measurements: Height: 6' (182.9 cm) Weight: 205 lb 4 oz (93.1 kg) IBW/kg (Calculated) : 77.6  Vital Signs: Temp: 99.8 F (37.7 C) (04/23 0400) Temp Source: Oral (04/23 0400) BP: 107/73 mmHg (04/23 0700) Pulse Rate: 111 (04/23 0700) Intake/Output from previous day: 04/22 0701 - 04/23 0700 In: 2714.1 [I.V.:84.1; IV Piggyback:450; TPN:2180] Out: 894 [Urine:875; Stool:100] Intake/Output from this shift: Total I/O In: 106.3 [I.V.:11.3; TPN:95] Out: -   Labs:  Recent Labs  03/16/16 0458 03/16/16 0646 03/16/16 0931  WBC 3.4* 4.2  --   HGB 7.2* 8.1*  --   HCT 25.4* 24.3*  --   PLT 77* 84*  --   APTT  --   --  39*  INR  --   --  1.27     Recent Labs  03/16/16 0458 03/16/16 0645 03/16/16 0931 03/17/16 0450 03/17/16 0612 03/18/16 0456  NA 129* 138  --   --  139 132*  K 6.0* 4.1  --   --  3.9 6.1*  CL 97* 104  --   --  103 97*  CO2 26 30  --   --  31 27  GLUCOSE 1565* 257*  --   --  140* 424*  BUN 13 14  --   --  23* 30*  CREATININE 1.22 1.07  --   --  0.95 1.06  CALCIUM 7.6* 7.9*  --   --  8.1* 7.2*  MG 2.2  --  2.0 2.7*  --   --   PHOS 6.3*  --  2.6  --  3.6 3.0  PROT 5.3*  --   --   --   --   --   ALBUMIN 1.5*  --   --   --  1.7* 1.4*  AST 27  --   --   --   --   --   ALT 13*  --   --   --   --   --   ALKPHOS 113  --   --   --   --   --   BILITOT 0.9  --   --   --   --   --   TRIG 73  --   --   --   --   --    Estimated Creatinine Clearance: 68.1 mL/min (by C-G formula based on Cr of 1.06).    Recent Labs  03/18/16 0500 03/18/16 0611 03/18/16 0703  GLUCAP 286* 275* 286*    Medical History: Past Medical History  Diagnosis Date  . CHF (congestive heart failure) ([place])   . DM (diabetes mellitus) ([place])   . Pancreatic cyst   . Skin cancer   . DDD (degenerative disc  disease), thoracic   . Renal failure   . Respiratory failure ([facility])     Insulin Requirements in the past 24 hours:  On insulin drip  Current Nutrition:  Clinimix E 5/20 at 95 ml/hr tonight.   Assessment: Pharmacy consulted to assist in electrolyte and glucose management in this 75 y/o M ventilated and sedated. Patient being followed by nephology, on IHD  Plan:  Potassium high this AM, has been given IV insulin and D50 this morning. No supplementation required at this time. Will continue to follow.   Rexene Edison, PharmD Clinical Pharmacist  03/18/2016  7:59 AM

## 2016-03-18 NOTE — Progress Notes (Signed)
RN spoke with Dr. Mortimer Fries and made him aware that patient's respiratory rate and work of breathing had increased therefore fentanyl was given and now since fentanyl was given patient's blood pressure has dropped but that the same happened yesterday when it was given and RN will watch and make sure BP comes back up.  Patient is alert with low BP.  RN made MD aware that patient continues to have RR of 31/min with some work of breathing even after fentanyl was given.  Dr. Mortimer Fries gave order for 2mg  of ativan once.

## 2016-03-18 NOTE — Progress Notes (Signed)
I have contacted the Wife Bethena Roys and have stated that patient is near cardiac arrest with very low BP and high HR. I also discussed multiorgan failure and poor quality of life  The wife has consented and agreed to DNR status. Will place orders  Wife satisfied with Plan of action and management. All questions answered  Corrin Parker, M.D.  [facility] Critical [facility] Medicine  Medical Director [place] Director [facility] Cardio-Pulmonary Department

## 2016-03-18 NOTE — Progress Notes (Signed)
Brief Nutrition Follow-up  RD notes TPN discontinued per nephrologist this am with elevated potassium (K 6.1) as well as insulin drip. Pt with emergent dialysis this am, however session ended early secondary to pt with acute tachy-arrythmia per MD note. RD notes on repeat renal panel K now 3.8, Mg 1.6 and P 2.3. Per Nsg note, pt given amp of D50 with discontinuation of TPN, blood sugars in 70s currently. Continue to follow and assess.  Dwyane Luo, RD, LDN Pager [phone] Weekend/On-Call Pager [phone]

## 2016-03-18 NOTE — Progress Notes (Signed)
Pt cont.on vent support and remained on an insulin drip and also on TPN . Pt has  K+ of 6.1 this morning and was given d50 and insulin this am. Further assessment via flowsheet.

## 2016-03-18 NOTE — Progress Notes (Signed)
Patient with acute tachy-arrythmia during dialysis Will stop HD and start amiodarone, may also consider vasopressors  Will update wife.

## 2016-03-18 NOTE — Progress Notes (Addendum)
At 1154 patient had a run of vtach into sustained SVT and Dr. Mortimer Fries present on the unit and aware and placed order for amioderone drip.  RN also made MD aware of low blood pressure that keeps fluctuating therefore MD gave order for levophed drip.  Dr. Mortimer Fries spoke with wife via telephone and patient is now a DNR. Dr. Juleen China present and aware of sustained SVT as well and gave order to stop dialysis and to draw renal panel.   Patient's rhthym back in Stach rate 106 at this time after dialysis was stopped per Dr. Assunta Gambles order.  Dr. Mortimer Fries aware that patient is now back in Stach low 100's and MD gave order to discontinue amioderone drip.  Blood pressure improved on levophed drip.

## 2016-03-18 NOTE — Progress Notes (Signed)
Subjective:   Hemodialysis yesterday. Tolerated treatment well.   Potassium 6.1 this morning. Dialysis orders placed. TPN discontinued   UOP 875 (1675)  Wife at bedside Objective:  Vital signs in last 24 hours:  Temp:  [99.1 F (37.3 C)-100 F (37.8 C)] 99.3 F (37.4 C) (04/23 0826) Pulse Rate:  [101-120] 119 (04/23 0800) Resp:  [13-33] 25 (04/23 0800) BP: (79-127)/(47-81) 116/68 mmHg (04/23 0800) SpO2:  [98 %-100 %] 99 % (04/23 0800) FiO2 (%):  [28 %] 28 % (04/23 0300) Weight:  [92.3 kg (203 lb 7.8 oz)-93.1 kg (205 lb 4 oz)] 93.1 kg (205 lb 4 oz) (04/23 0439)  Weight change:  Filed Weights   03/17/16 1120 03/17/16 1400 03/18/16 0439  Weight: 92.5 kg (203 lb 14.8 oz) 92.3 kg (203 lb 7.8 oz) 93.1 kg (205 lb 4 oz)    Intake/Output:    Intake/Output Summary (Last 24 hours) at 03/18/16 0851 Last data filed at 03/18/16 0825  Gross per 24 hour  Intake 2827.01 ml  Output    894 ml  Net 1933.01 ml     Physical Exam: General: Critically ill appearing   HEENT ETT  Neck Trachea midling  Pulm/lungs Vent assisted, bilateral rhonchi, PRVC FiO2 28%  CVS/Heart regular, soft systolic murmur  Abdomen:  Soft, non distended, BS present  Extremities: + peripheral edema,    Neurologic: Sedated, intubated  Skin: Heel skin breakdown  GU: Foley  Access:  Right femoral Vas-Cath/Dr. Lucky Cowboy 4/9    Basic Metabolic Panel:   Recent Labs Lab 03/23/2016 2237 03/15/16 0340 03/15/16 0341 03/16/16 0458 03/16/16 0645 03/16/16 0931 03/17/16 0450 03/17/16 0612 03/18/16 0456  NA  --  140  --  129* 138  --   --  139 132*  K  --  4.3  --  6.0* 4.1  --   --  3.9 6.1*  CL  --  108  --  97* 104  --   --  103 97*  CO2  --  29  --  26 30  --   --  31 27  GLUCOSE  --  135*  --  1565* 257*  --   --  140* 424*  BUN  --  10  --  13 14  --   --  23* 30*  CREATININE  --  0.66  --  1.22 1.07  --   --  0.95 1.06  CALCIUM  --  8.3*  --  7.6* 7.9*  --   --  8.1* 7.2*  MG 1.7  --  1.6* 2.2  --  2.0  2.7*  --   --   PHOS  --  2.9  --  6.3*  --  2.6  --  3.6 3.0     CBC:  Recent Labs Lab 03/13/2016 0549 03/11/2016 1020 03/15/16 0341 03/16/16 0458 03/16/16 0646  WBC 7.8 4.9 3.7* 3.4* 4.2  HGB 9.3* 8.2* 8.1* 7.2* 8.1*  HCT 27.3* 24.8* 24.6* 25.4* 24.3*  MCV 92.2 91.5 92.2 106.8* 90.5  PLT 69* 71* 65* 77* 84*      Microbiology:  Recent Results (from the past 720 hour(s))  Blood culture (routine x 2)     Status: Abnormal   Collection Time: 03/03/2016 12:59 PM  Result Value Ref Range Status   Specimen Description BLOOD LEFT FOREARM  Final   Special Requests BOTTLES DRAWN AEROBIC AND ANAEROBIC  1CC  Final   Culture  Setup Time   Final  GRAM POSITIVE COCCI AEROBIC BOTTLE ONLY CRITICAL VALUE NOTED.  VALUE IS CONSISTENT WITH PREVIOUSLY REPORTED AND CALLED VALUE.    Culture STREPTOCOCCUS PYOGENES AEROBIC BOTTLE ONLY  (A)  Final   Report Status 03/03/2016 FINAL  Final   Organism ID, Bacteria STREPTOCOCCUS PYOGENES  Final      Susceptibility   Streptococcus pyogenes - MIC*    CLINDAMYCIN Value in next row Sensitive      SENSITIVE0.25    AMPICILLIN Value in next row Sensitive      SENSITIVE0.25    ERYTHROMYCIN Value in next row Sensitive      SENSITIVE0.12    VANCOMYCIN Value in next row Sensitive      SENSITIVE0.5    CEFTRIAXONE Value in next row Sensitive      SENSITIVE0.12    LEVOFLOXACIN Value in next row Sensitive      SENSITIVE0.5    * STREPTOCOCCUS PYOGENES  Rapid Influenza A&B Antigens ([facility] only)     Status: None   Collection Time: 03/15/2016  1:00 PM  Result Value Ref Range Status   Influenza A ([facility]) NEGATIVE NEGATIVE Final   Influenza B ([facility]) NEGATIVE NEGATIVE Final  Blood culture (routine x 2)     Status: None   Collection Time: 03/10/2016  1:40 PM  Result Value Ref Range Status   Specimen Description BLOOD LEFT FOREARM  Final   Special Requests BOTTLES DRAWN AEROBIC AND ANAEROBIC  4CC  Final   Culture  Setup Time   Final    GRAM POSITIVE COCCI IN BOTH  AEROBIC AND ANAEROBIC BOTTLES CRITICAL RESULT CALLED TO, READ BACK BY AND VERIFIED WITH: NATE COOKSON AT 0426 02/27/16 CAF    Culture   Final    STREPTOCOCCUS PYOGENES IN BOTH AEROBIC AND ANAEROBIC BOTTLES    Report Status 02/29/2016 FINAL  Final   Organism ID, Bacteria STREPTOCOCCUS PYOGENES  Final      Susceptibility   Streptococcus pyogenes - MIC*    CLINDAMYCIN Value in next row Sensitive      SENSITIVE<=0.25    AMPICILLIN Value in next row Sensitive      SENSITIVE<=0.25    ERYTHROMYCIN Value in next row Sensitive      SENSITIVE0.12    VANCOMYCIN Value in next row Sensitive      SENSITIVE0.5    CEFTRIAXONE Value in next row Sensitive      SENSITIVE0.12    LEVOFLOXACIN Value in next row Sensitive      SENSITIVE0.5    * STREPTOCOCCUS PYOGENES  Blood Culture ID Panel (Reflexed)     Status: Abnormal   Collection Time: 03/04/2016  1:40 PM  Result Value Ref Range Status   Enterococcus species NOT DETECTED NOT DETECTED Final   Vancomycin resistance NOT DETECTED NOT DETECTED Final   Listeria monocytogenes NOT DETECTED NOT DETECTED Final   Staphylococcus species NOT DETECTED NOT DETECTED Final   Staphylococcus aureus NOT DETECTED NOT DETECTED Final   Methicillin resistance NOT DETECTED NOT DETECTED Final   Streptococcus species NOT DETECTED NOT DETECTED Final   Streptococcus agalactiae NOT DETECTED NOT DETECTED Final   Streptococcus pneumoniae NOT DETECTED NOT DETECTED Final   Streptococcus pyogenes DETECTED (A) NOT DETECTED Final    Comment: CRITICAL RESULT CALLED TO, READ BACK BY AND VERIFIED WITH: NATE COOKSON AT 0426 02/27/16 CAF    Acinetobacter baumannii NOT DETECTED NOT DETECTED Final   Enterobacteriaceae species NOT DETECTED NOT DETECTED Final   Enterobacter cloacae complex NOT DETECTED NOT DETECTED Final   Escherichia coli NOT  DETECTED NOT DETECTED Final   Klebsiella oxytoca NOT DETECTED NOT DETECTED Final   Klebsiella pneumoniae NOT DETECTED NOT DETECTED Final   Proteus  species NOT DETECTED NOT DETECTED Final   Serratia marcescens NOT DETECTED NOT DETECTED Final   Carbapenem resistance NOT DETECTED NOT DETECTED Final   Haemophilus influenzae NOT DETECTED NOT DETECTED Final   Neisseria meningitidis NOT DETECTED NOT DETECTED Final   Pseudomonas aeruginosa NOT DETECTED NOT DETECTED Final   Candida albicans NOT DETECTED NOT DETECTED Final   Candida glabrata NOT DETECTED NOT DETECTED Final   Candida krusei NOT DETECTED NOT DETECTED Final   Candida parapsilosis NOT DETECTED NOT DETECTED Final   Candida tropicalis NOT DETECTED NOT DETECTED Final  Body fluid culture     Status: None   Collection Time: 03/22/2016  3:05 PM  Result Value Ref Range Status   Specimen Description R Knee  Final   Special Requests NONE  Final   Gram Stain   Final    MODERATE WBC SEEN MODERATE GRAM POSITIVE COCCI IN PAIRS AND CHAINS    Culture   Final    HEAVY GROWTH GROUP A STREP (S.PYOGENES) ISOLATED There is no known Penicillin Resistant Beta Streptococcus in the [place] For patients that are Penicillin-allergic, Erythromycin is 85-94% susceptible, and Clindamycin is 80% susceptible.  Contact Microbiology within 7 days if sensitivity testing is  required.   NO ANAEROBES ISOLATED    Report Status 02/29/2016 FINAL  Final  Body fluid culture     Status: None   Collection Time: 02/25/2016  4:10 PM  Result Value Ref Range Status   Specimen Description R Knee  Final   Special Requests NONE  Final   Gram Stain   Final    MANY WBC SEEN MANY GRAM POSITIVE COCCI IN PAIRS AND CHAINS    Culture   Final    HEAVY GROWTH GROUP A STREP (S.PYOGENES) ISOLATED There is no known Penicillin Resistant Beta Streptococcus in the [place] For patients that are Penicillin-allergic, Erythromycin is 85-94% susceptible, and Clindamycin is 80% susceptible.  Contact Microbiology within 7 days if sensitivity testing is  required.   NO ANAEROBES ISOLATED    Report Status 02/29/2016 FINAL  Final  MRSA PCR  Screening     Status: None   Collection Time: 02/27/16  3:35 AM  Result Value Ref Range Status   MRSA by PCR NEGATIVE NEGATIVE Final    Comment:        The GeneXpert MRSA Assay (FDA approved for NASAL specimens only), is one component of a comprehensive MRSA colonization surveillance program. It is not intended to diagnose MRSA infection nor to guide or monitor treatment for MRSA infections.   Urine culture     Status: None   Collection Time: 02/27/16  8:30 AM  Result Value Ref Range Status   Specimen Description URINE, RANDOM  Final   Special Requests NONE  Final   Culture NO GROWTH 1 DAY  Final   Report Status 02/28/2016 FINAL  Final  Culture, blood (single) w Reflex to PCR ID Panel     Status: None   Collection Time: 02/29/16  2:36 PM  Result Value Ref Range Status   Specimen Description BLOOD LEFT HAND  Final   Special Requests BOTTLES DRAWN AEROBIC AND ANAEROBIC [place]  Final   Culture NO GROWTH 5 DAYS  Final   Report Status 03/05/2016 FINAL  Final  C difficile quick scan w PCR reflex     Status: None   Collection  Time: 03/06/16  5:37 PM  Result Value Ref Range Status   C Diff antigen NEGATIVE NEGATIVE Final   C Diff toxin NEGATIVE NEGATIVE Final   C Diff interpretation Negative for C. difficile  Final  Culture, respiratory (NON-Expectorated)     Status: None   Collection Time: 03/07/16  8:20 AM  Result Value Ref Range Status   Specimen Description TRACHEAL ASPIRATE  Final   Special Requests NONE  Final   Gram Stain   Final    MODERATE WBC SEEN FEW SQUAMOUS EPITHELIAL CELLS PRESENT RARE YEAST    Culture Consistent with normal respiratory flora.  Final   Report Status 03/09/2016 FINAL  Final  Culture, respiratory (NON-Expectorated)     Status: None   Collection Time: 03/08/16  4:52 PM  Result Value Ref Range Status   Specimen Description TRACHEAL ASPIRATE  Final   Special Requests NONE  Final   Gram Stain NO ORGANISMS SEEN  Final   Culture Consistent  with normal respiratory flora.  Final   Report Status 03/10/2016 FINAL  Final  Body fluid culture     Status: None   Collection Time: 03/13/16  3:49 PM  Result Value Ref Range Status   Specimen Description SYNOVIAL  Final   Special Requests Immunocompromised  Final   Gram Stain MANY WBC SEEN NO ORGANISMS SEEN   Final   Culture No growth aerobically or anaerobically.  Final   Report Status 03/17/2016 FINAL  Final  CULTURE, BLOOD (ROUTINE X 2) w Reflex to PCR ID Panel     Status: None (Preliminary result)   Collection Time: 03/02/2016 11:55 AM  Result Value Ref Range Status   Specimen Description BLOOD LEFT HAND  Final   Special Requests   Final    BOTTLES DRAWN AEROBIC AND ANAEROBIC  AERO 13CC ANA 5CC   Culture NO GROWTH 4 DAYS  Final   Report Status PENDING  Incomplete  CULTURE, BLOOD (ROUTINE X 2) w Reflex to PCR ID Panel     Status: None (Preliminary result)   Collection Time: 02/25/2016 11:55 AM  Result Value Ref Range Status   Specimen Description BLOOD RIGHT HAND  Final   Special Requests BOTTLES DRAWN AEROBIC AND ANAEROBIC  [place]  Final   Culture NO GROWTH 4 DAYS  Final   Report Status PENDING  Incomplete    Coagulation Studies:  Recent Labs  03/16/16 0931  LABPROT 16.0*  INR 1.27    Urinalysis: No results for input(s): COLORURINE, LABSPEC, PHURINE, GLUCOSEU, HGBUR, BILIRUBINUR, KETONESUR, PROTEINUR, UROBILINOGEN, NITRITE, LEUKOCYTESUR in the last 72 hours.  Invalid input(s): APPERANCEUR    Imaging: Dg Abd 1 View  03/16/2016  CLINICAL DATA:  Evaluate NG tube placement EXAM: ABDOMEN - 1 VIEW COMPARISON:  03/15/2016 FINDINGS: The nasogastric tube is not visualized within the field of view. Gaseous distension of the colon is identified. There is a right groin central venous catheter with tip overlying the expected location of the distal IVC. IMPRESSION: 1. No nasogastric tube is visualized and may be coiled within the upper esophagus. Electronically Signed   By: Kerby Moors M.D.   On: 03/16/2016 13:07   Ct Head Wo Contrast  03/16/2016  CLINICAL DATA:  75 year old with history of hypertension, chronic diastolic heart failure and diabetes with recent right knee infection requiring drainage and subsequent mechanical ventilation. Unable to wean from ventilator. EXAM: CT HEAD WITHOUT CONTRAST TECHNIQUE: Contiguous axial images were obtained from the base of the skull through the vertex without intravenous  contrast. COMPARISON:  CT 03/08/2016 FINDINGS: Images were reformatted due to head tilt in the gantry. Study is mildly motion degraded. There is no evidence of acute intracranial hemorrhage, mass lesion, brain edema or extra-axial fluid collection. There is stable generalized atrophy with dilatation of the ventricles and subarachnoid spaces. Cavum septum pellucidum and vergae again noted. There are stable chronic small vessel ischemic changes in the basal ganglia and periventricular white matter. No evidence of acute stroke. Prominent intracranial vascular calcifications. Scattered mucosal thickening throughout the paranasal sinuses without definite air-fluid levels. The right maxillary sinus is nearly completely opacified. The calvarium is intact. IMPRESSION: 1. No acute intracranial findings or significant changes from previous recent study demonstrated. 2. Stable generalized atrophy and chronic small vessel ischemic changes. 3. Worsening paranasal sinus opacification. Electronically Signed   By: Richardean Sale M.D.   On: 03/16/2016 14:39     Medications:   . Marland KitchenTPN (CLINIMIX-E) Adult 95 mL/hr at 03/18/16 0400  . alteplase    . dexmedetomidine Stopped (03/16/16 1136)  . insulin (NOVOLIN-R) infusion 12.3 Units/hr (03/18/16 0825)   . antiseptic oral rinse  7 mL Mouth Rinse QID  . aspirin  325 mg Per Tube Daily  . budesonide (PULMICORT) nebulizer solution  0.25 mg Nebulization BID  . chlorhexidine gluconate (SAGE KIT)  15 mL Mouth Rinse BID  . famotidine (PEPCID) IV   20 mg Intravenous Q48H  . ipratropium-albuterol  3 mL Nebulization Q6H  . pencillin G potassium IV  2 Million Units Intravenous Q6H  . pravastatin  40 mg Per Tube q1800  . sodium chloride flush  10-40 mL Intracatheter Q12H   acetaminophen **OR** [DISCONTINUED] acetaminophen, albuterol, alteplase, fentaNYL (SUBLIMAZE) injection, heparin, midazolam, ondansetron **OR** ondansetron (ZOFRAN) IV, pencillin G potassium IV, sodium chloride flush  Assessment/ Plan:  75 y.o. white  male  with coronary disease with four-vessel CABG in 1998, gallbladder rupture,  Diabetes with complications of retinopathy, peripheral neuropathy, peripheral vascular disease, Aorto iliac bypass, angioplasty and stent in his left leg, kyphoplasty for chronic back pain, CKD st 3 with Baseline Cr 1.5/ GFR 46  1. Acute renal failure on chronic kidney disease stage III: baseline creatinine of 1.5, eGFR of 46. Chronic kidney disease secondary to diabetic nephropathy. Required CRRT. Now transitioned to intermittent hemodialysis. Treatment on 4/21 and 4/22. Nonoliguric urine output. No UF with hemodialysis.  Complicated acute renal failure with Anasarca, hypotension, metabolic acidosis, hyponatremia - Emergent hemodialysis scheduled for today due to hyperkalemia. Discontinue TPN for now due to potassium content.   2. Rt Knee septic arthritis with sepsis: strep pyogenes. Appreciate ortho and infectious disease input.  - penicillin G   3. Acute resp faliure. Intubated 4/3, extubated and re-intubated 4/13. Now with tracheostomy 4/17.  - PEG as per primary team  4. Anemia with kidney failure: status post transfusion 4/16. Platelets continues to be low.     LOS: [place], [place] 4/23/20178:51 AM

## 2016-03-18 NOTE — Progress Notes (Signed)
At 1202 blood glucose was rechecked and was 54 therefore RN notified Dr. Mortimer Fries and an additional amp of D50 given to patient. Blood glucose rechecked at this time and is 73.  RN will continue to monitor.  Patient now back in Stach rate low 100's.  Blood pressure has improved and is stable on levophed drip.

## 2016-03-19 DIAGNOSIS — J209 Acute bronchitis, unspecified: Secondary | ICD-10-CM

## 2016-03-19 LAB — RENAL FUNCTION PANEL
ANION GAP: 10 (ref 5–15)
Albumin: 1.5 g/dL — ABNORMAL LOW (ref 3.5–5.0)
BUN: 30 mg/dL — ABNORMAL HIGH (ref 6–20)
CALCIUM: 7.3 mg/dL — AB (ref 8.9–10.3)
CHLORIDE: 99 mmol/L — AB (ref 101–111)
CO2: 25 mmol/L (ref 22–32)
CREATININE: 1.31 mg/dL — AB (ref 0.61–1.24)
GFR, EST NON AFRICAN AMERICAN: 52 mL/min — AB (ref >60)
Glucose, Bld: 271 mg/dL — ABNORMAL HIGH (ref 65–99)
Phosphorus: 3.3 mg/dL (ref 2.5–4.6)
Potassium: 4.6 mmol/L (ref 3.5–5.1)
SODIUM: 134 mmol/L — AB (ref 135–145)

## 2016-03-19 LAB — CBC WITH DIFFERENTIAL/PLATELET
Basophils Absolute: 0.1 10*3/uL (ref 0–0.1)
Basophils Relative: 1 %
EOS ABS: 0.5 10*3/uL (ref 0–0.7)
Eosinophils Relative: 6 %
HCT: 22.9 % — ABNORMAL LOW (ref 40.0–52.0)
HEMOGLOBIN: 7.8 g/dL — AB (ref 13.0–18.0)
LYMPHS ABS: 1.6 10*3/uL (ref 1.0–3.6)
LYMPHS PCT: 19 %
MCH: 30.5 pg (ref 26.0–34.0)
MCHC: 34 g/dL (ref 32.0–36.0)
MCV: 89.5 fL (ref 80.0–100.0)
Monocytes Absolute: 0.8 10*3/uL (ref 0.2–1.0)
Monocytes Relative: 10 %
NEUTROS PCT: 64 %
Neutro Abs: 5.2 10*3/uL (ref 1.4–6.5)
Platelets: 255 10*3/uL (ref 150–440)
RBC: 2.56 MIL/uL — AB (ref 4.40–5.90)
RDW: 14.6 % — ABNORMAL HIGH (ref 11.5–14.5)
WBC: 8.1 10*3/uL (ref 3.8–10.6)

## 2016-03-19 LAB — GLUCOSE, CAPILLARY
GLUCOSE-CAPILLARY: 319 mg/dL — AB (ref 65–99)
Glucose-Capillary: 102 mg/dL — ABNORMAL HIGH (ref 65–99)
Glucose-Capillary: 187 mg/dL — ABNORMAL HIGH (ref 65–99)
Glucose-Capillary: 195 mg/dL — ABNORMAL HIGH (ref 65–99)
Glucose-Capillary: 196 mg/dL — ABNORMAL HIGH (ref 65–99)
Glucose-Capillary: 198 mg/dL — ABNORMAL HIGH (ref 65–99)
Glucose-Capillary: 205 mg/dL — ABNORMAL HIGH (ref 65–99)
Glucose-Capillary: 233 mg/dL — ABNORMAL HIGH (ref 65–99)
Glucose-Capillary: 286 mg/dL — ABNORMAL HIGH (ref 65–99)
Glucose-Capillary: 301 mg/dL — ABNORMAL HIGH (ref 65–99)

## 2016-03-19 LAB — MAGNESIUM: MAGNESIUM: 1.7 mg/dL (ref 1.7–2.4)

## 2016-03-19 MED ORDER — DEXTROSE-NACL 5-0.9 % IV SOLN
INTRAVENOUS | Status: AC
  Administered 2016-03-19: 12:00:00 via INTRAVENOUS

## 2016-03-19 MED ORDER — CHLORHEXIDINE GLUCONATE 0.12% ORAL RINSE (MEDLINE KIT)
15.0000 mL | Freq: Two times a day (BID) | OROMUCOSAL | Status: DC
  Administered 2016-03-19 – 2016-03-20 (×2): 15 mL via OROMUCOSAL
  Filled 2016-03-19 (×4): qty 15

## 2016-03-19 MED ORDER — ANTISEPTIC ORAL RINSE SOLUTION (CORINZ)
7.0000 mL | OROMUCOSAL | Status: DC
  Administered 2016-03-19 – 2016-03-20 (×6): 7 mL via OROMUCOSAL
  Filled 2016-03-19 (×15): qty 7

## 2016-03-19 MED ORDER — STERILE WATER FOR INJECTION IJ SOLN
INTRAMUSCULAR | Status: AC
  Administered 2016-03-19: 10 mL
  Filled 2016-03-19: qty 10

## 2016-03-19 MED ORDER — TRACE MINERALS CR-CU-MN-SE-ZN 10-1000-500-60 MCG/ML IV SOLN
INTRAVENOUS | Status: DC
  Administered 2016-03-19: 19:00:00 via INTRAVENOUS
  Filled 2016-03-19: qty 960

## 2016-03-19 MED ORDER — INSULIN ASPART 100 UNIT/ML ~~LOC~~ SOLN
0.0000 [IU] | SUBCUTANEOUS | Status: DC
  Administered 2016-03-19: 11 [IU] via SUBCUTANEOUS
  Administered 2016-03-19: 5 [IU] via SUBCUTANEOUS
  Administered 2016-03-20: 2 [IU] via SUBCUTANEOUS
  Administered 2016-03-20: 3 [IU] via SUBCUTANEOUS
  Filled 2016-03-19: qty 5
  Filled 2016-03-19: qty 3
  Filled 2016-03-19: qty 5
  Filled 2016-03-19: qty 11

## 2016-03-19 NOTE — Progress Notes (Signed)
Inpatient Diabetes Program Recommendations  AACE/ADA: New Consensus Statement on Inpatient Glycemic Control (2015)  Target Ranges:  Prepandial:   less than 140 mg/dL      Peak postprandial:   less than 180 mg/dL (1-2 hours)      Critically ill patients:  140 - 180 mg/dL   Results for MERRIC, ANSARI (MRN [id number]) as of 03/19/2016 15:52  Ref. Range 03/19/2016 01:58 03/19/2016 03:00 03/19/2016 07:16 03/19/2016 08:16 03/19/2016 11:47  Glucose-Capillary Latest Ref Range: 65-99 mg/dL 198 (H) 233 (H) 286 (H) 301 (H) 319 (H)    Home DM Meds: Lantus 15 units QHS       Novolog 5 units tidwc  Current Insulin Orders: Novolog Moderate Correction Scale/ SSI (0-15 units) Q4hours     -Called by Pharmacist to discuss patient's CBGs and insulin requirements.  Per Pharmacy, patient to start TPN at 40cc/hr tonight.  CBG at 12pm today: 319 mg/dl.  Concern that patient may require restart of IV insulin drip to help bring glucose levels down safely.  -Called Dr. Elsworth Soho to discuss.  Dr. Elsworth Soho stated that they may withdraw support on patient in the next 24 hours and that they did not want to start IV insulin drip at this time.  Alerted Pharmacy to this plan.     --Will follow patient during hospitalization--  Wyn Quaker RN, MSN, CDE Diabetes Coordinator Inpatient Glycemic Control Team Team Pager: [phone] (8a-5p)

## 2016-03-19 NOTE — Progress Notes (Signed)
Nutrition Follow-up  DOCUMENTATION CODES:   Not applicable  INTERVENTION:  -Discussed nutritional poc during ICU rounds with MD Ashby Dawes and with MD Singh;pt has no access for Enteral Nutrition at this time; MDs agreeable to restarting TPN today but requesting TPN formula without electrolytes and for TPN to be started at lower volume. Only TPN formula on hospital formulary without electrolytes is 5%AA/15%Dextrose, discussed starting at rate of 40 ml/hr (960 mL in 24 hour period) with MD Candiss Norse, MD agrees with this rate. Recommend discontinuing D5-NS maintenance fluid once TPN initiated. Discussed poc with Myra RN. Will assess for tolerance. FSBS in 300s this afternoon, pt may benefit from restarting insulin drip with reinitiation of TPN  NUTRITION DIAGNOSIS:   Inadequate oral intake related to acute illness as evidenced by NPO status.  Being addressed via TPN  GOAL:   Provide needs based on ASPEN/SCCM guidelines   MONITOR:   Vent status, Labs, I & O's, Weight trends, Skin, TF tolerance  REASON FOR ASSESSMENT:   Ventilator    ASSESSMENT:   75 yo male admitted with septic shock from arthritis with recent aspiration of knee joint, respiratory distress with severe acidosis requiring intubation on 02/27/16  Pt remains on vent support via trach, nonoliguric UOP at present (650 mL in 24 hours, 280 mL documented thus far today), no UF with HD at present, currently on levophed  Diet Order: NPO  Skin:   (Stage I buttock, Stage II sacral pressure ulcer)  Last BM:  03/19/16 loose stool via rectal tube   Labs: sodium 134, potassium 4.6, albumin 1.5, corrected calcium 9.3  Glucose Profile:  Recent Labs  03/19/16 0716 03/19/16 0816 03/19/16 1147  GLUCAP 286* 301* 319*   Meds: ss novolog, levophed, D5-NS with at 75 ml/hr  Height:   Ht Readings from Last 1 Encounters:  02/28/16 6' (1.829 m)    W eight:   Wt Readings from Last 1 Encounters:  03/19/16 216 lb 7.9 oz (98.2  kg)    Ideal Body Weight:  80.9 kg  BMI:  Body mass index is 29.36 kg/(m^2).  Estimated Nutritional Needs:   Kcal:  2089 kcals   Protein:  128-170 g/kg  Fluid:  >1.9 L  EDUCATION NEEDS:   Education needs no appropriate at this time  [place], [place], [place] [phone] Pager  [phone] Weekend/On-Call Pager

## 2016-03-19 NOTE — Progress Notes (Signed)
Subjective:  UOP 675 cc Remains critically ill  Requiring pressors   Objective:  Vital signs in last 24 hours:  Temp:  [98.2 F (36.8 C)-100.3 F (37.9 C)] 99.1 F (37.3 C) (04/24 0400) Pulse Rate:  [97-152] 102 (04/24 0600) Resp:  [0-33] 23 (04/24 0600) BP: (56-128)/(29-76) 102/62 mmHg (04/24 0600) SpO2:  [93 %-100 %] 100 % (04/24 0900) FiO2 (%):  [28 %] 28 % (04/24 0900) Weight:  [98.2 kg (216 lb 7.9 oz)] 98.2 kg (216 lb 7.9 oz) (04/24 0400)  Weight change: 6.7 kg (14 lb 12.3 oz) Filed Weights   03/17/16 1400 03/18/16 0439 03/19/16 0400  Weight: 92.3 kg (203 lb 7.8 oz) 93.1 kg (205 lb 4 oz) 98.2 kg (216 lb 7.9 oz)    Intake/Output:    Intake/Output Summary (Last 24 hours) at 03/19/16 0913 Last data filed at 03/19/16 0433  Gross per 24 hour  Intake  186.7 ml  Output   -215 ml  Net  401.7 ml     Physical Exam: General: Critically ill appearing   HEENT Dry oral mucus membranes  Neck Trach in place  Pulm/lungs Vent assisted, bilateral rhonchi,   FiO2 28%  CVS/Heart regular, soft systolic murmur, tachycardic  Abdomen:  Soft, non distended,    Extremities: ++ dependent peripheral edema,    Neurologic:  responds to pain, did not follow commands  Skin: Heel skin breakdown  GU: Foley  Access:  Right femoral Vas-Cath/Dr. Lucky Cowboy 4/9    Basic Metabolic Panel:   Recent Labs Lab 03/16/16 0458 03/16/16 0645 03/16/16 5409 03/17/16 0450 03/17/16 0612 03/18/16 0456 03/18/16 1219 03/19/16 0442  NA 129* 138  --   --  139 132* 135 134*  K 6.0* 4.1  --   --  3.9 6.1* 3.8 4.6  CL 97* 104  --   --  103 97* 101 99*  CO2 26 30  --   --  _0 GLUCOSE 1565* 257*  --   --  140* 424* 172* 271*  BUN 13 14  --   --  23* 30* 25* 30*  CREATININE 1.22 1.07  --   --  0.95 1.06 0.92 1.31*  CALCIUM 7.6* 7.9*  --   --  8.1* 7.2* 7.1* 7.3*  MG 2.2  --  2.0 2.7*  --   --  1.6* 1.7  PHOS 6.3*  --  2.6  --  3.6 3.0 2.3* 3.3     CBC:  Recent Labs Lab 03/25/2016 1020  03/15/16 0341 03/16/16 0458 03/16/16 0646  WBC 4.9 3.7* 3.4* 4.2  HGB 8.2* 8.1* 7.2* 8.1*  HCT 24.8* 24.6* 25.4* 24.3*  MCV 91.5 92.2 106.8* 90.5  PLT 71* 65* 77* 84*      Microbiology:  Recent Results (from the past 720 hour(s))  Blood culture (routine x 2)     Status: Abnormal   Collection Time: 03/17/2016 12:59 PM  Result Value Ref Range Status   Specimen Description BLOOD LEFT FOREARM  Final   Special Requests BOTTLES DRAWN AEROBIC AND ANAEROBIC  1CC  Final   Culture  Setup Time   Final    GRAM POSITIVE COCCI AEROBIC BOTTLE ONLY CRITICAL VALUE NOTED.  VALUE IS CONSISTENT WITH PREVIOUSLY REPORTED AND CALLED VALUE.    Culture STREPTOCOCCUS PYOGENES AEROBIC BOTTLE ONLY  (A)  Final   Report Status 03/03/2016 FINAL  Final   Organism ID, Bacteria STREPTOCOCCUS PYOGENES  Final      Susceptibility   Streptococcus  pyogenes - MIC*    CLINDAMYCIN Value in next row Sensitive      SENSITIVE0.25    AMPICILLIN Value in next row Sensitive      SENSITIVE0.25    ERYTHROMYCIN Value in next row Sensitive      SENSITIVE0.12    VANCOMYCIN Value in next row Sensitive      SENSITIVE0.5    CEFTRIAXONE Value in next row Sensitive      SENSITIVE0.12    LEVOFLOXACIN Value in next row Sensitive      SENSITIVE0.5    * STREPTOCOCCUS PYOGENES  Rapid Influenza A&B Antigens ([facility] only)     Status: None   Collection Time: 03/21/2016  1:00 PM  Result Value Ref Range Status   Influenza A ([facility]) NEGATIVE NEGATIVE Final   Influenza B ([facility]) NEGATIVE NEGATIVE Final  Blood culture (routine x 2)     Status: None   Collection Time: 02/27/2016  1:40 PM  Result Value Ref Range Status   Specimen Description BLOOD LEFT FOREARM  Final   Special Requests BOTTLES DRAWN AEROBIC AND ANAEROBIC  4CC  Final   Culture  Setup Time   Final    GRAM POSITIVE COCCI IN BOTH AEROBIC AND ANAEROBIC BOTTLES CRITICAL RESULT CALLED TO, READ BACK BY AND VERIFIED WITH: [place] AT 0426 02/27/16 CAF    Culture   Final     STREPTOCOCCUS PYOGENES IN BOTH AEROBIC AND ANAEROBIC BOTTLES    Report Status 02/29/2016 FINAL  Final   Organism ID, Bacteria STREPTOCOCCUS PYOGENES  Final      Susceptibility   Streptococcus pyogenes - MIC*    CLINDAMYCIN Value in next row Sensitive      SENSITIVE<=0.25    AMPICILLIN Value in next row Sensitive      SENSITIVE<=0.25    ERYTHROMYCIN Value in next row Sensitive      SENSITIVE0.12    VANCOMYCIN Value in next row Sensitive      SENSITIVE0.5    CEFTRIAXONE Value in next row Sensitive      SENSITIVE0.12    LEVOFLOXACIN Value in next row Sensitive      SENSITIVE0.5    * STREPTOCOCCUS PYOGENES  Blood Culture ID Panel (Reflexed)     Status: Abnormal   Collection Time: 03/24/2016  1:40 PM  Result Value Ref Range Status   Enterococcus species NOT DETECTED NOT DETECTED Final   Vancomycin resistance NOT DETECTED NOT DETECTED Final   Listeria monocytogenes NOT DETECTED NOT DETECTED Final   Staphylococcus species NOT DETECTED NOT DETECTED Final   Staphylococcus aureus NOT DETECTED NOT DETECTED Final   Methicillin resistance NOT DETECTED NOT DETECTED Final   Streptococcus species NOT DETECTED NOT DETECTED Final   Streptococcus agalactiae NOT DETECTED NOT DETECTED Final   Streptococcus pneumoniae NOT DETECTED NOT DETECTED Final   Streptococcus pyogenes DETECTED (A) NOT DETECTED Final    Comment: CRITICAL RESULT CALLED TO, READ BACK BY AND VERIFIED WITH: NATE COOKSON AT 0426 02/27/16 CAF    Acinetobacter baumannii NOT DETECTED NOT DETECTED Final   Enterobacteriaceae species NOT DETECTED NOT DETECTED Final   Enterobacter cloacae complex NOT DETECTED NOT DETECTED Final   Escherichia coli NOT DETECTED NOT DETECTED Final   Klebsiella oxytoca NOT DETECTED NOT DETECTED Final   Klebsiella pneumoniae NOT DETECTED NOT DETECTED Final   Proteus species NOT DETECTED NOT DETECTED Final   Serratia marcescens NOT DETECTED NOT DETECTED Final   Carbapenem resistance NOT DETECTED NOT DETECTED  Final   Haemophilus influenzae NOT DETECTED NOT DETECTED  Final   Neisseria meningitidis NOT DETECTED NOT DETECTED Final   Pseudomonas aeruginosa NOT DETECTED NOT DETECTED Final   Candida albicans NOT DETECTED NOT DETECTED Final   Candida glabrata NOT DETECTED NOT DETECTED Final   Candida krusei NOT DETECTED NOT DETECTED Final   Candida parapsilosis NOT DETECTED NOT DETECTED Final   Candida tropicalis NOT DETECTED NOT DETECTED Final  Body fluid culture     Status: None   Collection Time: 03/13/2016  3:05 PM  Result Value Ref Range Status   Specimen Description R Knee  Final   Special Requests NONE  Final   Gram Stain   Final    MODERATE WBC SEEN MODERATE GRAM POSITIVE COCCI IN PAIRS AND CHAINS    Culture   Final    HEAVY GROWTH GROUP A STREP (S.PYOGENES) ISOLATED There is no known Penicillin Resistant Beta Streptococcus in the [place] For patients that are Penicillin-allergic, Erythromycin is 85-94% susceptible, and Clindamycin is 80% susceptible.  Contact Microbiology within 7 days if sensitivity testing is  required.   NO ANAEROBES ISOLATED    Report Status 02/29/2016 FINAL  Final  Body fluid culture     Status: None   Collection Time: 02/27/2016  4:10 PM  Result Value Ref Range Status   Specimen Description R Knee  Final   Special Requests NONE  Final   Gram Stain   Final    MANY WBC SEEN MANY GRAM POSITIVE COCCI IN PAIRS AND CHAINS    Culture   Final    HEAVY GROWTH GROUP A STREP (S.PYOGENES) ISOLATED There is no known Penicillin Resistant Beta Streptococcus in the [place] For patients that are Penicillin-allergic, Erythromycin is 85-94% susceptible, and Clindamycin is 80% susceptible.  Contact Microbiology within 7 days if sensitivity testing is  required.   NO ANAEROBES ISOLATED    Report Status 02/29/2016 FINAL  Final  MRSA PCR Screening     Status: None   Collection Time: 02/27/16  3:35 AM  Result Value Ref Range Status   MRSA by PCR NEGATIVE NEGATIVE Final    Comment:         The GeneXpert MRSA Assay (FDA approved for NASAL specimens only), is one component of a comprehensive MRSA colonization surveillance program. It is not intended to diagnose MRSA infection nor to guide or monitor treatment for MRSA infections.   Urine culture     Status: None   Collection Time: 02/27/16  8:30 AM  Result Value Ref Range Status   Specimen Description URINE, RANDOM  Final   Special Requests NONE  Final   Culture NO GROWTH 1 DAY  Final   Report Status 02/28/2016 FINAL  Final  Culture, blood (single) w Reflex to PCR ID Panel     Status: None   Collection Time: 02/29/16  2:36 PM  Result Value Ref Range Status   Specimen Description BLOOD LEFT HAND  Final   Special Requests BOTTLES DRAWN AEROBIC AND ANAEROBIC [place]  Final   Culture NO GROWTH 5 DAYS  Final   Report Status 03/05/2016 FINAL  Final  C difficile quick scan w PCR reflex     Status: None   Collection Time: 03/06/16  5:37 PM  Result Value Ref Range Status   C Diff antigen NEGATIVE NEGATIVE Final   C Diff toxin NEGATIVE NEGATIVE Final   C Diff interpretation Negative for C. difficile  Final  Culture, respiratory (NON-Expectorated)     Status: None   Collection Time: 03/07/16  8:20 AM  Result  Value Ref Range Status   Specimen Description TRACHEAL ASPIRATE  Final   Special Requests NONE  Final   Gram Stain   Final    MODERATE WBC SEEN FEW SQUAMOUS EPITHELIAL CELLS PRESENT RARE YEAST    Culture Consistent with normal respiratory flora.  Final   Report Status 03/09/2016 FINAL  Final  Culture, respiratory (NON-Expectorated)     Status: None   Collection Time: 03/08/16  4:52 PM  Result Value Ref Range Status   Specimen Description TRACHEAL ASPIRATE  Final   Special Requests NONE  Final   Gram Stain NO ORGANISMS SEEN  Final   Culture Consistent with normal respiratory flora.  Final   Report Status 03/10/2016 FINAL  Final  Body fluid culture     Status: None   Collection Time: 03/13/16  3:49 PM   Result Value Ref Range Status   Specimen Description SYNOVIAL  Final   Special Requests Immunocompromised  Final   Gram Stain MANY WBC SEEN NO ORGANISMS SEEN   Final   Culture No growth aerobically or anaerobically.  Final   Report Status 03/17/2016 FINAL  Final  CULTURE, BLOOD (ROUTINE X 2) w Reflex to PCR ID Panel     Status: None (Preliminary result)   Collection Time: 03/11/2016 11:55 AM  Result Value Ref Range Status   Specimen Description BLOOD LEFT HAND  Final   Special Requests   Final    BOTTLES DRAWN AEROBIC AND ANAEROBIC  AERO 13CC ANA 5CC   Culture NO GROWTH 4 DAYS  Final   Report Status PENDING  Incomplete  CULTURE, BLOOD (ROUTINE X 2) w Reflex to PCR ID Panel     Status: None (Preliminary result)   Collection Time: 03/11/2016 11:55 AM  Result Value Ref Range Status   Specimen Description BLOOD RIGHT HAND  Final   Special Requests BOTTLES DRAWN AEROBIC AND ANAEROBIC  [place]  Final   Culture NO GROWTH 4 DAYS  Final   Report Status PENDING  Incomplete    Coagulation Studies:  Recent Labs  03/16/16 0931  LABPROT 16.0*  INR 1.27    Urinalysis: No results for input(s): COLORURINE, LABSPEC, PHURINE, GLUCOSEU, HGBUR, BILIRUBINUR, KETONESUR, PROTEINUR, UROBILINOGEN, NITRITE, LEUKOCYTESUR in the last 72 hours.  Invalid input(s): APPERANCEUR    Imaging: Dg Chest Port 1 View  03/18/2016  CLINICAL DATA:  A acute respiratory failure EXAM: PORTABLE CHEST 1 VIEW COMPARISON:  03/13/2016 FINDINGS: Tracheostomy tube and RIGHT PICC line unchanged. Removal of RIGHT central venous line. Stable cardiac silhouette. There are low lung volumes and bilateral pleural effusions. Mild improvement in aeration to lung bases. No pneumothorax. IMPRESSION: 1. Improvement in aeration to lung bases. 2. Persistent low lung volumes and bilateral effusions. Electronically Signed   By: Suzy Bouchard M.D.   On: 03/18/2016 09:47     Medications:   . alteplase    . dexmedetomidine Stopped (03/16/16  1136)  . dextrose 25 mL/hr at 03/18/16 1332  . norepinephrine (LEVOPHED) Adult infusion 9 mcg/min (03/18/16 2155)   . antiseptic oral rinse  7 mL Mouth Rinse QID  . budesonide (PULMICORT) nebulizer solution  0.25 mg Nebulization BID  . chlorhexidine gluconate (SAGE KIT)  15 mL Mouth Rinse BID  . famotidine (PEPCID) IV  20 mg Intravenous Q48H  . ipratropium-albuterol  3 mL Nebulization Q6H  . LORazepam  2 mg Intravenous Once  . pencillin G potassium IV  2 Million Units Intravenous Q6H  . pravastatin  40 mg Per Tube q1800  .  sodium chloride flush  10-40 mL Intracatheter Q12H   acetaminophen **OR** [DISCONTINUED] acetaminophen, albuterol, alteplase, fentaNYL (SUBLIMAZE) injection, heparin, midazolam, ondansetron **OR** ondansetron (ZOFRAN) IV, pencillin G potassium IV, sodium chloride flush  Assessment/ Plan:  75 y.o. white  male  with coronary disease with four-vessel CABG in 1998, gallbladder rupture,  Diabetes with complications of retinopathy, peripheral neuropathy, peripheral vascular disease, Aorto iliac bypass, angioplasty and stent in his left leg, kyphoplasty for chronic back pain, CKD st 3 with Baseline Cr 1.5/ GFR 46  1. Acute renal failure on chronic kidney disease stage III: baseline creatinine of 1.5, eGFR of 46.  Chronic kidney disease secondary to diabetic nephropathy. Required CRRT. Now transitioned to intermittent hemodialysis. Treatment on 4/21 and 4/22. Nonoliguric urine output. No UF with hemodialysis.  Complicated acute renal failure with Anasarca, hypotension, metabolic acidosis, hyponatremia - UOP 650 cc - iv albumin for oncotic support  2. Rt Knee septic arthritis with sepsis: strep pyogenes.  ortho and infectious disease following - penicillin G   3. Acute resp faliure. Intubated 4/3, extubated and re-intubated 4/13. Now with tracheostomy 4/17.   4.Generalized edema/ Anasarca - consider iv albumin for oncotic support    LOS: 22 Joella Saefong 4/24/20179:13  AM

## 2016-03-19 NOTE — Progress Notes (Signed)
Chaplain rounded the unit and provided a compassionate presence and spiritual support to the patient with silent prayer.John Schultz [phone]

## 2016-03-19 NOTE — Care Management (Addendum)
CRRT stopped 4/23. Remains on pressors. Patient now a DNR. Per pulmonary, condition has declined, pending PEG placement attempt again. Vtach and sustained SVT over the weekend.  LTACH continues to follow but will hold gettting authorization for now.

## 2016-03-19 NOTE — Progress Notes (Signed)
PARENTERAL NUTRITION CONSULT NOTE -FOLLOW UP   Pharmacy Consult for Electrolyte and Glucose Monitoring  Indication: TPN  Allergies  Allergen Reactions  . Tape Rash    Patient Measurements: Height: 6' (182.9 cm) Weight: 216 lb 7.9 oz (98.2 kg) IBW/kg (Calculated) : 77.6  Vital Signs: Temp: 99.3 F (37.4 C) (04/24 1200) Temp Source: Axillary (04/24 0400) BP: 121/72 mmHg (04/24 1400) Pulse Rate: 111 (04/24 1400) Intake/Output from previous day: 04/23 0701 - 04/24 0700 In: 400.3 [I.V.:110.3; IV Piggyback:100; TPN:190] Out: -215 [Urine:650] Intake/Output from this shift: Total I/O In: 250 [IV Piggyback:250] Out: 280 [Urine:280]  Labs: No results for input(s): WBC, HGB, HCT, PLT, APTT, INR in the last 72 hours.   Recent Labs  03/17/16 0450  03/18/16 0456 03/18/16 1219 03/19/16 0442  NA  --   < > 132* 135 134*  K  --   < > 6.1* 3.8 4.6  CL  --   < > 97* 101 99*  CO2  --   < > 27 30 25   GLUCOSE  --   < > 424* 172* 271*  BUN  --   < > 30* 25* 30*  CREATININE  --   < > 1.06 0.92 1.31*  CALCIUM  --   < > 7.2* 7.1* 7.3*  MG 2.7*  --   --  1.6* 1.7  PHOS  --   < > 3.0 2.3* 3.3  ALBUMIN  --   < > 1.4* 1.2* 1.5*  PREALBUMIN  --   --  2.7*  --   --   < > = values in this interval not displayed. Estimated Creatinine Clearance: 60.9 mL/min (by C-G formula based on Cr of 1.31).    Recent Labs  03/19/16 0716 03/19/16 0816 03/19/16 1147  GLUCAP 286* 301* 319*    Medical History: Past Medical History  Diagnosis Date  . CHF (congestive heart failure) ([place])   . DM (diabetes mellitus) ([place])   . Pancreatic cyst   . Skin cancer   . DDD (degenerative disc disease), thoracic   . Renal failure   . Respiratory failure ([facility])     Insulin Requirements in the past 24 hours:  Insulin drip was stopped on 4/25 after a low of 52 after TPN was stopped. TPN was discontinued yesterday due to hyperkalemia (K 6.1)  Current Nutrition:  Clinimix without electrolytes @ 40 mL/hr to  begin at 1800   Assessment: Pharmacy consulted to assist in electrolyte and glucose management in this 75 y/o M ventilated and sedated. Patient being followed by nephology, on IHD  Patient was on Clinimix E and insulin drip - both were stopped yesterday 2/2 hyperkalemia and low BG. TPN without electroyltes is being started this evening.   Electrolytes WNL this AM. K 4.6, Mg 1.7, Phos 3.3  Plan:  No electrolyte supplementation needed CBG and SSI ordered q4h  Thank you for the consult. Pharmacy will continue to monitor.  Theodis Shove, PharmD Clinical Pharmacist  03/19/2016 3:38 PM

## 2016-03-19 NOTE — Progress Notes (Addendum)
[facility] Progess Note    ASSESSMENT/PLAN   Addendum 4/24 6:11 PM: I was called to the patient's room to discuss the patient's condition with the patient's wife and family were at the bedside. They had started to wonder if there family member was going to improve and began to worry that his condition was terminal and he would not improve. I confirmed this to them, that the patient would require long-term feeding tube, which could not be placed at this time, therefore, he is on TPN. In addition, his mental status is very poor, I suspect that even if he could have a feeding tube and he was discharged he would be back to the hospital in short order. Therefore, I confirmed to them that their suspicions about him not improving are true, and I recommended that he be changed to comfort measures only and allowed to pass away naturally. They would like to discuss this further amongst themselves and will inform us of their decision.   DISCUSSION: 75 year old male with septic right knee, developed sepsis/renal failure requiring CRRT, also requiring intubation, now status post tracheostomy on 03/14/2016. Delirium/agitation, and poor urine output, along with inability to completely wean off ventilator are limiting factors to his overall clinical improvement.  PULMONARY Acute vent dep respiratory failure - s/p trach Pulm edema pattern on CXR Failed extubation 04/13 S/P trach 4/17 by ENT P: Cont vent support - settings reviewed and/or adjusted vent-PRVC/500/16/5/28% Cont vent bundle Daily SBT, and sedation vacation;  S/p trach 4/17-trach care per protocol PSV as tolerated; no immediate plan to downsize trach  CARDIOVASCULAR Septic shock, resolved NSTEMI PAF P:  MAP goal > 60 mmHg Off phenylephrine/vasopressin  Cont ASA and statin Further cardiac w/u after critical illness resolves per cardiology  RENAL AKI Severe hypervolemia P: Monitor BMET  intermittently Monitor I/Os Correct electrolytes as indicated Renal Service following HD per nephrology; last HD 4/21  GASTROINTESTINAL Diarrhea  Hemoccult positive  FEN P: SUP: enteral famotidine Cont TFs Monitor for bleeding GI attempted PEG on 4/19, unsuccessful IR attempted PEG on 4/19, unable to perform due to abnormal anatomy Surgery consulted for PEG eval Fluoro consult for NGT placement.  Continue TPN   HEMATOLOGIC Anemia without overt bleeding Thrombocytopenia  P: DVT px: SCDs (SQ heparin DC'd 04/12) Monitor CBC intermittently Transfuse per usual guidelines  INFECTIOUS Septic arthritis of the R knee Purulent ET secretions - much improved, now with Trach Small increase in PCT 04/14 - unclear significance P: Monitor temp, WBC count Micro and abx as above ID service following -  RIJ removed, R arm PICC placed.   Micro/culture results: 04/02 MRSA PCR >> NEG 04/02 flu screen >> NEG 04/02 blood >> Strep pyogenes 04/02 knee aspirate >> Strep pyogenes 04/05 blood >> NEG 04/11 C diff >> NEG 04/12 Resp >> NOF 04/13 Resp >> normal respiratory flora 04/18 R knee aspirate>>  Antibiotics: Ceftriaxone 04/02 X 1  Vanc 04/02 >> 04/03 Pip-tazo 04/02 >> 04/03 Clinda 04/03 >> 04/07 Pen G 04/03 >>   PCT algorithm 04/03: 22.50 > 16.65 > 7.12 PCT algorithm 04/12: 0.62 > 0.57 > 2.66   ENDOCRINE DM 2, controlled Hyperglycemia improved P: Cont Lantus Cont SSI  NEUROLOGIC ICU acquired delirium - hypoactive, CT head negative Profound deconditioning P: RASS goal: 0, -1 Off sedation; monitor Monitor for bradycardia PRN fentanyl Willl need extensive rehab if he survives this acute phase of illness  Best Practice: Code Status: Full. Diet:NPO/TFs GI prophylaxis: PPI. VTE prophylaxis: SCD's / no  heparin as patient gets heparin with dialysis   INDWELLING DEVICES:: R IJ CVL 04/03 >> 4/18 R arm PICC 4/18>> ETT 04/03 >> 04/13, 04/13 >>  R femoral  HD cath 04/09 >>     MAJOR EVENTS/TEST RESULTS: 04/02 admitted by Hospitalist Service with 4 day history of right knee pain - swollen, erythematous and tender on exam - and severe sepsis, AKI, abnormal EKG. Cardiac markers positive (peak troponin I 8.10) 04/03 Orthopedics consultation and aspiration of R Knee, with drain placement 04/03 ID consultation 04/03 Nephrology consultation 04/04 TTE: LVEF 60-65%, no vegetations 04/04 altered mental status, worsening respiratory status, septic, intubated 04/04 R knee hemovac removed by ortho 04/06 Cardiology consultation: NSTEMI - deemed secondary to demand ischemia. Further W/U to be considered after resolution of severe sepsis and critical illness 04/09 CRRT initiated  04/10 agitated on WUA. Failed SBT. Off and on norepienphrine 04/11 Tolerating volume removal by CRRT. Failed SBT. Tolerated PS 10-14 cm H2O 04/12 Failed SBT. Agitated on WUA. Uo improving. Started back on fentanyl gtt in PM 04/13 LE venous US:  04/13 Passed SBT. Cognition remains marginal. Extubated. Post extubation stridor and poor cough. BiPAP initiated. High risk of re-intubation 04/13 Re-intubated in afternoon. ENT consult for trach tube placement - planned 04/17.  04/13 CT head: diffuse atrophy, no acute disease 04/14 CRRT resumed due to hypervolemia 4/17 trach by ENT 4/18 started back on levophed 4/19 PEG by GI attempted, unsuccessful 4/21 NG placed  ---------------------------------------   ----------------------------------------   Name: ZACCHARY JARVINEN MRN: [id number] DOB: 06-Jul-1941    ADMISSION DATE:  03/16/2016    SUBJECTIVE:   Pt currently on the ventilator, can not provide history or review of systems.      VITAL SIGNS: Temp:  [98.2 F (36.8 C)-100.3 F (37.9 C)] 99.1 F (37.3 C) (04/24 0400) Pulse Rate:  [97-152] 102 (04/24 0600) Resp:  [0-33] 23 (04/24 0600) BP: (56-128)/(29-76) 102/62 mmHg (04/24 0600) SpO2:  [93 %-100 %] 100 % (04/24  0600) FiO2 (%):  [28 %] 28 % (04/24 0126) Weight:  [216 lb 7.9 oz (98.2 kg)] 216 lb 7.9 oz (98.2 kg) (04/24 0400) HEMODYNAMICS:   VENTILATOR SETTINGS: Vent Mode:  [-] PRVC FiO2 (%):  [28 %] 28 % Set Rate:  [16 bmp] 16 bmp Vt Set:  [500 mL] 500 mL PEEP:  [5 cmH20] 5 cmH20 Plateau Pressure:  [18 cmH20] 18 cmH20 INTAKE / OUTPUT:  Intake/Output Summary (Last 24 hours) at 03/19/16 0819 Last data filed at 03/19/16 0433  Gross per 24 hour  Intake    199 ml  Output   -215 ml  Net    414 ml    PHYSICAL EXAMINATION: Physical Examination:   VS: BP 102/62 mmHg  Pulse 102  Temp(Src) 99.1 F (37.3 C) (Axillary)  Resp 23  Ht 6' (1.829 m)  Wt 216 lb 7.9 oz (98.2 kg)  BMI 29.36 kg/m2  SpO2 100%  General Appearance: No distress  Neuro:without focal findings, mental status normal. HEENT: PERRLA, EOM intact. Pulmonary: normal breath sounds  , decreased air entry bilaterally. CardiovascularNormal S1,S2.  No m/r/g.   Abdomen: Benign, Soft, non-tender. Renal:  No costovertebral tenderness  GU:  Not performed at this time. Endocrine: No evident thyromegaly. Skin:   warm, no rashes, no ecchymosis  Extremities: normal, no cyanosis, clubbing.   LABS:   LABORATORY PANEL:   CBC  Recent Labs Lab 03/16/16 0646  WBC 4.2  HGB 8.1*  HCT 24.3*  PLT 84*    Chemistries  Recent Labs Lab 03/16/16 0458  03/19/16 0442  NA 129*  < > 134*  K 6.0*  < > 4.6  CL 97*  < > 99*  CO2 26  < > 25  GLUCOSE 1565*  < > 271*  BUN 13  < > 30*  CREATININE 1.22  < > 1.31*  CALCIUM 7.6*  < > 7.3*  MG 2.2  < > 1.7  PHOS 6.3*  < > 3.3  AST 27  --   --   ALT 13*  --   --   ALKPHOS 113  --   --   BILITOT 0.9  --   --   < > = values in this interval not displayed.   Recent Labs Lab 03/19/16 0003 03/19/16 0054 03/19/16 0158 03/19/16 0300 03/19/16 0716 03/19/16 0816  GLUCAP 195* 187* 198* 233* 286* 301*    Recent Labs Lab 03/13/16 0800 03/16/16 0845  PHART 7.41 7.50*  PCO2ART 38  41  PO2ART 89 79*    Recent Labs Lab 03/16/16 0458  03/18/16 0456 03/18/16 1219 03/19/16 0442  AST 27  --   --   --   --   ALT 13*  --   --   --   --   ALKPHOS 113  --   --   --   --   BILITOT 0.9  --   --   --   --   ALBUMIN 1.5*  < > 1.4* 1.2* 1.5*  < > = values in this interval not displayed.  Cardiac Enzymes No results for input(s): TROPONINI in the last 168 hours.  RADIOLOGY:  Dg Chest Port 1 View  03/18/2016  CLINICAL DATA:  A acute respiratory failure EXAM: PORTABLE CHEST 1 VIEW COMPARISON:  03/13/2016 FINDINGS: Tracheostomy tube and RIGHT PICC line unchanged. Removal of RIGHT central venous line. Stable cardiac silhouette. There are low lung volumes and bilateral pleural effusions. Mild improvement in aeration to lung bases. No pneumothorax. IMPRESSION: 1. Improvement in aeration to lung bases. 2. Persistent low lung volumes and bilateral effusions. Electronically Signed   By: Suzy Bouchard M.D.   On: 03/18/2016 09:47       --Marda Stalker, MD.  ICU Pager: [phone] [facility] Office Number: [phone]  Patricia Pesa, M.D.  Vilinda Boehringer, M.D.  Merton Border, M.D  03/19/2016  Critical Care Attestation.  I have personally obtained a history, examined the patient, evaluated laboratory and imaging results, formulated the assessment and plan and placed orders. The Patient requires high complexity decision making for assessment and support, frequent evaluation and titration of therapies, application of advanced monitoring technologies and extensive interpretation of multiple databases. The patient has critical illness that could lead imminently to failure of 1 or more organ systems and requires the highest level of physician preparedness to intervene.  Critical Care Time devoted to patient care services described in this note is 35 minutes and is exclusive of time spent in procedures.

## 2016-03-20 ENCOUNTER — Encounter: Payer: Self-pay | Admitting: Pulmonary Disease

## 2016-03-20 DIAGNOSIS — G9341 Metabolic encephalopathy: Secondary | ICD-10-CM

## 2016-03-20 DIAGNOSIS — N183 Chronic kidney disease, stage 3 (moderate): Secondary | ICD-10-CM

## 2016-03-20 DIAGNOSIS — J9600 Acute respiratory failure, unspecified whether with hypoxia or hypercapnia: Secondary | ICD-10-CM

## 2016-03-20 DIAGNOSIS — A400 Sepsis due to streptococcus, group A: Principal | ICD-10-CM

## 2016-03-20 DIAGNOSIS — E1122 Type 2 diabetes mellitus with diabetic chronic kidney disease: Secondary | ICD-10-CM

## 2016-03-20 DIAGNOSIS — E872 Acidosis: Secondary | ICD-10-CM

## 2016-03-20 DIAGNOSIS — J811 Chronic pulmonary edema: Secondary | ICD-10-CM

## 2016-03-20 DIAGNOSIS — Z515 Encounter for palliative care: Secondary | ICD-10-CM

## 2016-03-20 LAB — CBC
HCT: 23.2 % — ABNORMAL LOW (ref 40.0–52.0)
HEMOGLOBIN: 7.4 g/dL — AB (ref 13.0–18.0)
MCH: 30.4 pg (ref 26.0–34.0)
MCHC: 32 g/dL (ref 32.0–36.0)
MCV: 95.1 fL (ref 80.0–100.0)
PLATELETS: 231 10*3/uL (ref 150–440)
RBC: 2.44 MIL/uL — AB (ref 4.40–5.90)
RDW: 15.6 % — ABNORMAL HIGH (ref 11.5–14.5)
WBC: 8 10*3/uL (ref 3.8–10.6)

## 2016-03-20 LAB — RENAL FUNCTION PANEL
ANION GAP: 5 (ref 5–15)
Albumin: 1.3 g/dL — ABNORMAL LOW (ref 3.5–5.0)
BUN: 28 mg/dL — ABNORMAL HIGH (ref 6–20)
CALCIUM: 7.2 mg/dL — AB (ref 8.9–10.3)
CHLORIDE: 100 mmol/L — AB (ref 101–111)
CO2: 28 mmol/L (ref 22–32)
CREATININE: 1.31 mg/dL — AB (ref 0.61–1.24)
GFR, EST NON AFRICAN AMERICAN: 52 mL/min — AB (ref >60)
Glucose, Bld: 202 mg/dL — ABNORMAL HIGH (ref 65–99)
Phosphorus: 2.9 mg/dL (ref 2.5–4.6)
Potassium: 3.7 mmol/L (ref 3.5–5.1)
Sodium: 133 mmol/L — ABNORMAL LOW (ref 135–145)

## 2016-03-20 LAB — BASIC METABOLIC PANEL
ANION GAP: 6 (ref 5–15)
BUN: 29 mg/dL — ABNORMAL HIGH (ref 6–20)
CALCIUM: 7.2 mg/dL — AB (ref 8.9–10.3)
CO2: 28 mmol/L (ref 22–32)
Chloride: 99 mmol/L — ABNORMAL LOW (ref 101–111)
Creatinine, Ser: 1.3 mg/dL — ABNORMAL HIGH (ref 0.61–1.24)
GFR calc Af Amer: >60 mL/min (ref >60)
GFR, EST NON AFRICAN AMERICAN: 53 mL/min — AB (ref >60)
Glucose, Bld: 204 mg/dL — ABNORMAL HIGH (ref 65–99)
POTASSIUM: 3.7 mmol/L (ref 3.5–5.1)
Sodium: 133 mmol/L — ABNORMAL LOW (ref 135–145)

## 2016-03-20 LAB — PHOSPHORUS: Phosphorus: 2.8 mg/dL (ref 2.5–4.6)

## 2016-03-20 LAB — GLUCOSE, CAPILLARY
GLUCOSE-CAPILLARY: 119 mg/dL — AB (ref 65–99)
GLUCOSE-CAPILLARY: 126 mg/dL — AB (ref 65–99)
GLUCOSE-CAPILLARY: 196 mg/dL — AB (ref 65–99)
Glucose-Capillary: 181 mg/dL — ABNORMAL HIGH (ref 65–99)

## 2016-03-20 LAB — MAGNESIUM: Magnesium: 1.5 mg/dL — ABNORMAL LOW (ref 1.7–2.4)

## 2016-03-20 MED ORDER — LORAZEPAM 2 MG/ML IJ SOLN
0.5000 mg | INTRAMUSCULAR | Status: DC | PRN
  Administered 2016-03-20: 0.5 mg via INTRAVENOUS
  Filled 2016-03-20: qty 1

## 2016-03-20 MED ORDER — HALOPERIDOL 0.5 MG PO TABS: 0.5000 mg | ORAL_TABLET | ORAL | Status: DC | PRN

## 2016-03-20 MED ORDER — MAGNESIUM SULFATE 2 GM/50ML IV SOLN
2.0000 g | Freq: Once | INTRAVENOUS | Status: AC
  Administered 2016-03-20: 2 g via INTRAVENOUS
  Filled 2016-03-20: qty 50

## 2016-03-20 MED ORDER — LORAZEPAM 2 MG/ML IJ SOLN: 1.0000 mg | INTRAMUSCULAR | Status: DC | PRN

## 2016-03-20 MED ORDER — MORPHINE SULFATE (PF) 4 MG/ML IV SOLN
4.0000 mg | Freq: Once | INTRAVENOUS | Status: AC
  Administered 2016-03-20: 4 mg via INTRAVENOUS
  Filled 2016-03-20: qty 1

## 2016-03-20 MED ORDER — LORAZEPAM 2 MG/ML IJ SOLN
2.0000 mg | INTRAMUSCULAR | Status: DC | PRN
  Administered 2016-03-20: 2 mg via INTRAVENOUS

## 2016-03-20 MED ORDER — HALOPERIDOL LACTATE 5 MG/ML IJ SOLN: 0.5000 mg | INTRAMUSCULAR | Status: DC | PRN

## 2016-03-20 MED ORDER — ACETAMINOPHEN 325 MG PO TABS: 650.0000 mg | ORAL_TABLET | Freq: Four times a day (QID) | ORAL | Status: DC | PRN

## 2016-03-20 MED ORDER — PROCHLORPERAZINE 25 MG RE SUPP
25.0000 mg | Freq: Three times a day (TID) | RECTAL | Status: DC | PRN
  Filled 2016-03-20: qty 1

## 2016-03-20 MED ORDER — LORAZEPAM 0.5 MG PO TABS
1.0000 mg | ORAL_TABLET | ORAL | Status: DC | PRN
Start: 2016-03-20 — End: 2016-03-20

## 2016-03-20 MED ORDER — MORPHINE SULFATE (PF) 2 MG/ML IV SOLN
2.0000 mg | INTRAVENOUS | Status: DC | PRN
  Administered 2016-03-20: 4 mg via INTRAVENOUS
  Filled 2016-03-20: qty 2

## 2016-03-20 MED ORDER — HALOPERIDOL LACTATE 2 MG/ML PO CONC
0.5000 mg | ORAL | Status: DC | PRN
  Filled 2016-03-20: qty 0.3

## 2016-03-20 MED ORDER — LORAZEPAM 2 MG/ML IJ SOLN: 0.5000 mg | INTRAMUSCULAR | Status: DC | PRN

## 2016-03-20 MED ORDER — MORPHINE SULFATE (PF) 2 MG/ML IV SOLN
INTRAVENOUS | Status: AC
  Administered 2016-03-20: 2 mg via INTRAVENOUS
  Filled 2016-03-20: qty 1

## 2016-03-20 MED ORDER — BIOTENE DRY MOUTH MT LIQD: 15.0000 mL | OROMUCOSAL | Status: DC | PRN

## 2016-03-20 MED ORDER — ACETAMINOPHEN 650 MG RE SUPP
650.0000 mg | Freq: Four times a day (QID) | RECTAL | Status: DC | PRN
  Filled 2016-03-20: qty 1

## 2016-03-20 MED ORDER — LORAZEPAM 2 MG/ML PO CONC: 2.0000 mg | ORAL | Status: DC | PRN

## 2016-03-20 MED ORDER — MORPHINE 100MG IN NS 100ML (1MG/ML) PREMIX INFUSION
5.0000 mg/h | INTRAVENOUS | Status: DC
  Administered 2016-03-20: 5 mg/h via INTRAVENOUS
  Filled 2016-03-20: qty 100

## 2016-03-20 MED ORDER — BISACODYL 10 MG RE SUPP: 10.0000 mg | Freq: Every day | RECTAL | Status: DC | PRN

## 2016-03-20 MED ORDER — POLYVINYL ALCOHOL 1.4 % OP SOLN
1.0000 [drp] | Freq: Four times a day (QID) | OPHTHALMIC | Status: DC | PRN
  Filled 2016-03-20: qty 15

## 2016-03-20 MED ORDER — GLYCOPYRROLATE 0.2 MG/ML IJ SOLN: 0.2000 mg | INTRAMUSCULAR | Status: DC | PRN

## 2016-03-20 MED ORDER — ONDANSETRON HCL 4 MG/2ML IJ SOLN
4.0000 mg | Freq: Four times a day (QID) | INTRAMUSCULAR | Status: DC | PRN
Start: 2016-03-20 — End: 2016-03-20

## 2016-03-20 MED ORDER — ACETAMINOPHEN 650 MG RE SUPP: 650.0000 mg | Freq: Four times a day (QID) | RECTAL | Status: DC | PRN

## 2016-03-20 MED ORDER — MORPHINE SULFATE (PF) 2 MG/ML IV SOLN
2.0000 mg | Freq: Once | INTRAVENOUS | Status: AC
  Administered 2016-03-20: 2 mg via INTRAVENOUS

## 2016-03-20 MED ORDER — ONDANSETRON 4 MG PO TBDP
4.0000 mg | ORAL_TABLET | Freq: Four times a day (QID) | ORAL | Status: DC | PRN
  Filled 2016-03-20: qty 1

## 2016-03-20 MED ORDER — GLYCOPYRROLATE 1 MG PO TABS
1.0000 mg | ORAL_TABLET | ORAL | Status: DC | PRN
  Filled 2016-03-20: qty 1

## 2016-03-24 LAB — CULTURE, BLOOD (ROUTINE X 2)
CULTURE: NO GROWTH
Culture: NO GROWTH

## 2016-03-26 NOTE — Progress Notes (Signed)
Report called to [place], RN prior to transport to rm 106. Patient transported on 5 mg/ml Morphine drip per MD order.

## 2016-03-26 NOTE — Progress Notes (Signed)
Patient extubated per MD order to comfort care. Removed from ventilator by Jenny Reichmann, RT. Levophed and TPN discontinued 4mg  Morphine IV and 2 mg IVLorazepam administered. Patient wife, son, and other family members at bedside. Continue to monitor.

## 2016-03-26 NOTE — Progress Notes (Signed)
Morphine drip initiated at 5 mg/hr for comfort measures.

## 2016-03-26 NOTE — Progress Notes (Signed)
   Apr 04, 2016 2000  Clinical Encounter Type  Visited With Family  Visit Type Death  Consult/Referral To Chaplain  Chaplain provided a compassionate presence to family.   [place] [phone]

## 2016-03-26 NOTE — Progress Notes (Signed)
  [facility] Progess Note    ASSESSMENT/PLAN      DISCUSSION: 75 year old male with septic right knee, developed sepsis/renal failure requiring CRRT, also requiring intubation, now status post tracheostomy on 03/03/2016. Delirium/agitation, and poor urine output, along with inability to completely wean off ventilator are limiting factors to his overall clinical improvement.  Vital signs reviewed, stable. HEENT: Unremarkable. Chest: Clear to all station bilaterally with reduced air entry bilaterally. Heart: Normal S1, S2, no murmurs, rubs or gallops. Extremities: Mild bilateral lower extremity edema. Abdomen: Soft, nontender, nondistended.  PULMONARY Acute vent dep respiratory failure - s/p trach Pulm edema pattern on CXR Failed extubation 04/13 S/P trach 4/17 by ENT Septic shock, resolved NSTEMI PAF AKI Severe hypervolemia Diarrhea   Anemia without overt bleeding Thrombocytopenia  Septic arthritis of the R knee DM 2, controlled ICU acquired delirium - hypoactive, CT head negative Profound deconditioning  Vent settings reviewed and adjusted, blood gas was reviewed, unremarkable, chest x-ray images reviewed, normal life support devices in place. No significant change was noted. Labs reviewed, unremarkable.  Patient's condition was discussed with the family yesterday, they have a subsequently decided to withdraw.  Note: After extensive discussion in between  family members and Dr.Helane Briceno, family has decided to withdraw care at this time.   Bincy Varughese,AG-ACNP Pulmonary & Critical Care  Deep Sadiya Durand M.D. 2016/04/10   Critical Care Attestation.  I have personally obtained a history, examined the patient, evaluated laboratory and imaging results, formulated the assessment and plan and placed orders. The Patient requires high complexity decision making for assessment and support, frequent evaluation and titration of therapies,  application of advanced monitoring technologies and extensive interpretation of multiple databases. The patient has critical illness that could lead imminently to failure of 1 or more organ systems and requires the highest level of physician preparedness to intervene.  Critical Care Time devoted to patient care services described in this note is 35 minutes and is exclusive of time spent in procedures.

## 2016-03-26 NOTE — Progress Notes (Signed)
Pt. Tolerated ventilator settings O/N, alert did not sleep- not following commands. VSS, titrated levo gtt down to 46mcg w/o complication. Pt. Receiving TPN w/o complication. Pt.'s UOP 20-25 mL/h on average.  No major events

## 2016-03-26 NOTE — Progress Notes (Signed)
Subjective:  UOP 675 cc Remains critically ill Wife at bedside     Objective:  Vital signs in last 24 hours:  Temp:  [97.8 F (36.6 C)-101.3 F (38.5 C)] 97.8 F (36.6 C) (04/25 0700) Pulse Rate:  [90-114] 96 (04/25 1100) Resp:  [7-31] 29 (04/25 1100) BP: (66-144)/(18-94) 122/63 mmHg (04/25 1100) SpO2:  [90 %-100 %] 95 % (04/25 1100) FiO2 (%):  [28 %] 28 % (04/25 0730) Weight:  [95.5 kg (210 lb 8.6 oz)] 95.5 kg (210 lb 8.6 oz) (04/25 0419)  Weight change: -2.7 kg (-5 lb 15.2 oz) Filed Weights   03/18/16 0439 03/19/16 0400 2016-04-02 0419  Weight: 93.1 kg (205 lb 4 oz) 98.2 kg (216 lb 7.9 oz) 95.5 kg (210 lb 8.6 oz)    Intake/Output:    Intake/Output Summary (Last 24 hours) at 2016-04-02 1656 Last data filed at 04/02/16 0600  Gross per 24 hour  Intake 1104.55 ml  Output    870 ml  Net 234.55 ml     Physical Exam: General: Critically ill appearing   HEENT Dry oral mucus membranes  Neck Trach in place  Pulm/lungs Vent assisted, bilateral rhonchi,      CVS/Heart regular, soft systolic murmur, tachycardic  Abdomen:  Soft, non distended,    Extremities: ++ dependent peripheral edema,    Neurologic:  responds to pain, did not follow commands  Skin: Heel skin breakdown  GU: Foley  Access:  Right femoral Vas-Cath/Dr. Lucky Cowboy 4/9    Basic Metabolic Panel:   Recent Labs Lab 03/16/16 0931 03/17/16 0450  03/18/16 0456 03/18/16 1219 03/19/16 0442 04-02-2016 0436 04/02/16 0610  NA  --   --   < > 132* 135 134* 133* 133*  K  --   --   < > 6.1* 3.8 4.6 3.7 3.7  CL  --   --   < > 97* 101 99* 100* 99*  CO2  --   --   < > 27 30 25 28 28   GLUCOSE  --   --   < > 424* 172* 271* 202* 204*  BUN  --   --   < > 30* 25* 30* 28* 29*  CREATININE  --   --   < > 1.06 0.92 1.31* 1.31* 1.30*  CALCIUM  --   --   < > 7.2* 7.1* 7.3* 7.2* 7.2*  MG 2.0 2.7*  --   --  1.6* 1.7  --  1.5*  PHOS 2.6  --   < > 3.0 2.3* 3.3 2.9 2.8  < > = values in this interval not displayed.   CBC:  Recent  Labs Lab 03/15/16 0341 03/16/16 0458 03/16/16 0646 03/19/16 2300 04/02/16 0436  WBC 3.7* 3.4* 4.2 8.1 8.0  NEUTROABS  --   --   --  5.2  --   HGB 8.1* 7.2* 8.1* 7.8* 7.4*  HCT 24.6* 25.4* 24.3* 22.9* 23.2*  MCV 92.2 106.8* 90.5 89.5 95.1  PLT 65* 77* 84* 255 231      Microbiology:  Recent Results (from the past 720 hour(s))  Blood culture (routine x 2)     Status: Abnormal   Collection Time: 03/23/2016 12:59 PM  Result Value Ref Range Status   Specimen Description BLOOD LEFT FOREARM  Final   Special Requests BOTTLES DRAWN AEROBIC AND ANAEROBIC  1CC  Final   Culture  Setup Time   Final    GRAM POSITIVE COCCI AEROBIC BOTTLE ONLY CRITICAL VALUE NOTED.  VALUE IS  CONSISTENT WITH PREVIOUSLY REPORTED AND CALLED VALUE.    Culture STREPTOCOCCUS PYOGENES AEROBIC BOTTLE ONLY  (A)  Final   Report Status 03/03/2016 FINAL  Final   Organism ID, Bacteria STREPTOCOCCUS PYOGENES  Final      Susceptibility   Streptococcus pyogenes - MIC*    CLINDAMYCIN Value in next row Sensitive      SENSITIVE0.25    AMPICILLIN Value in next row Sensitive      SENSITIVE0.25    ERYTHROMYCIN Value in next row Sensitive      SENSITIVE0.12    VANCOMYCIN Value in next row Sensitive      SENSITIVE0.5    CEFTRIAXONE Value in next row Sensitive      SENSITIVE0.12    LEVOFLOXACIN Value in next row Sensitive      SENSITIVE0.5    * STREPTOCOCCUS PYOGENES  Rapid Influenza A&B Antigens ([facility] only)     Status: None   Collection Time: 03/02/2016  1:00 PM  Result Value Ref Range Status   Influenza A ([facility]) NEGATIVE NEGATIVE Final   Influenza B ([facility]) NEGATIVE NEGATIVE Final  Blood culture (routine x 2)     Status: None   Collection Time: 03/03/2016  1:40 PM  Result Value Ref Range Status   Specimen Description BLOOD LEFT FOREARM  Final   Special Requests BOTTLES DRAWN AEROBIC AND ANAEROBIC  4CC  Final   Culture  Setup Time   Final    GRAM POSITIVE COCCI IN BOTH AEROBIC AND ANAEROBIC BOTTLES CRITICAL RESULT  CALLED TO, READ BACK BY AND VERIFIED WITH: NATE COOKSON AT 0426 02/27/16 CAF    Culture   Final    STREPTOCOCCUS PYOGENES IN BOTH AEROBIC AND ANAEROBIC BOTTLES    Report Status 02/29/2016 FINAL  Final   Organism ID, Bacteria STREPTOCOCCUS PYOGENES  Final      Susceptibility   Streptococcus pyogenes - MIC*    CLINDAMYCIN Value in next row Sensitive      SENSITIVE<=0.25    AMPICILLIN Value in next row Sensitive      SENSITIVE<=0.25    ERYTHROMYCIN Value in next row Sensitive      SENSITIVE0.12    VANCOMYCIN Value in next row Sensitive      SENSITIVE0.5    CEFTRIAXONE Value in next row Sensitive      SENSITIVE0.12    LEVOFLOXACIN Value in next row Sensitive      SENSITIVE0.5    * STREPTOCOCCUS PYOGENES  Blood Culture ID Panel (Reflexed)     Status: Abnormal   Collection Time: 03/10/2016  1:40 PM  Result Value Ref Range Status   Enterococcus species NOT DETECTED NOT DETECTED Final   Vancomycin resistance NOT DETECTED NOT DETECTED Final   Listeria monocytogenes NOT DETECTED NOT DETECTED Final   Staphylococcus species NOT DETECTED NOT DETECTED Final   Staphylococcus aureus NOT DETECTED NOT DETECTED Final   Methicillin resistance NOT DETECTED NOT DETECTED Final   Streptococcus species NOT DETECTED NOT DETECTED Final   Streptococcus agalactiae NOT DETECTED NOT DETECTED Final   Streptococcus pneumoniae NOT DETECTED NOT DETECTED Final   Streptococcus pyogenes DETECTED (A) NOT DETECTED Final    Comment: CRITICAL RESULT CALLED TO, READ BACK BY AND VERIFIED WITH: NATE COOKSON AT 0426 02/27/16 CAF    Acinetobacter baumannii NOT DETECTED NOT DETECTED Final   Enterobacteriaceae species NOT DETECTED NOT DETECTED Final   Enterobacter cloacae complex NOT DETECTED NOT DETECTED Final   Escherichia coli NOT DETECTED NOT DETECTED Final   Klebsiella oxytoca NOT DETECTED NOT DETECTED  Final   Klebsiella pneumoniae NOT DETECTED NOT DETECTED Final   Proteus species NOT DETECTED NOT DETECTED Final    Serratia marcescens NOT DETECTED NOT DETECTED Final   Carbapenem resistance NOT DETECTED NOT DETECTED Final   Haemophilus influenzae NOT DETECTED NOT DETECTED Final   Neisseria meningitidis NOT DETECTED NOT DETECTED Final   Pseudomonas aeruginosa NOT DETECTED NOT DETECTED Final   Candida albicans NOT DETECTED NOT DETECTED Final   Candida glabrata NOT DETECTED NOT DETECTED Final   Candida krusei NOT DETECTED NOT DETECTED Final   Candida parapsilosis NOT DETECTED NOT DETECTED Final   Candida tropicalis NOT DETECTED NOT DETECTED Final  Body fluid culture     Status: None   Collection Time: 03/11/2016  3:05 PM  Result Value Ref Range Status   Specimen Description R Knee  Final   Special Requests NONE  Final   Gram Stain   Final    MODERATE WBC SEEN MODERATE GRAM POSITIVE COCCI IN PAIRS AND CHAINS    Culture   Final    HEAVY GROWTH GROUP A STREP (S.PYOGENES) ISOLATED There is no known Penicillin Resistant Beta Streptococcus in the [place] For patients that are Penicillin-allergic, Erythromycin is 85-94% susceptible, and Clindamycin is 80% susceptible.  Contact Microbiology within 7 days if sensitivity testing is  required.   NO ANAEROBES ISOLATED    Report Status 02/29/2016 FINAL  Final  Body fluid culture     Status: None   Collection Time: 03/10/2016  4:10 PM  Result Value Ref Range Status   Specimen Description R Knee  Final   Special Requests NONE  Final   Gram Stain   Final    MANY WBC SEEN MANY GRAM POSITIVE COCCI IN PAIRS AND CHAINS    Culture   Final    HEAVY GROWTH GROUP A STREP (S.PYOGENES) ISOLATED There is no known Penicillin Resistant Beta Streptococcus in the [place] For patients that are Penicillin-allergic, Erythromycin is 85-94% susceptible, and Clindamycin is 80% susceptible.  Contact Microbiology within 7 days if sensitivity testing is  required.   NO ANAEROBES ISOLATED    Report Status 02/29/2016 FINAL  Final  MRSA PCR Screening     Status: None   Collection Time:  02/27/16  3:35 AM  Result Value Ref Range Status   MRSA by PCR NEGATIVE NEGATIVE Final    Comment:        The GeneXpert MRSA Assay (FDA approved for NASAL specimens only), is one component of a comprehensive MRSA colonization surveillance program. It is not intended to diagnose MRSA infection nor to guide or monitor treatment for MRSA infections.   Urine culture     Status: None   Collection Time: 02/27/16  8:30 AM  Result Value Ref Range Status   Specimen Description URINE, RANDOM  Final   Special Requests NONE  Final   Culture NO GROWTH 1 DAY  Final   Report Status 02/28/2016 FINAL  Final  Culture, blood (single) w Reflex to PCR ID Panel     Status: None   Collection Time: 02/29/16  2:36 PM  Result Value Ref Range Status   Specimen Description BLOOD LEFT HAND  Final   Special Requests BOTTLES DRAWN AEROBIC AND ANAEROBIC [place]  Final   Culture NO GROWTH 5 DAYS  Final   Report Status 03/05/2016 FINAL  Final  C difficile quick scan w PCR reflex     Status: None   Collection Time: 03/06/16  5:37 PM  Result Value Ref Range Status  C Diff antigen NEGATIVE NEGATIVE Final   C Diff toxin NEGATIVE NEGATIVE Final   C Diff interpretation Negative for C. difficile  Final  Culture, respiratory (NON-Expectorated)     Status: None   Collection Time: 03/07/16  8:20 AM  Result Value Ref Range Status   Specimen Description TRACHEAL ASPIRATE  Final   Special Requests NONE  Final   Gram Stain   Final    MODERATE WBC SEEN FEW SQUAMOUS EPITHELIAL CELLS PRESENT RARE YEAST    Culture Consistent with normal respiratory flora.  Final   Report Status 03/09/2016 FINAL  Final  Culture, respiratory (NON-Expectorated)     Status: None   Collection Time: 03/08/16  4:52 PM  Result Value Ref Range Status   Specimen Description TRACHEAL ASPIRATE  Final   Special Requests NONE  Final   Gram Stain NO ORGANISMS SEEN  Final   Culture Consistent with normal respiratory flora.  Final   Report  Status 03/10/2016 FINAL  Final  Body fluid culture     Status: None   Collection Time: 03/13/16  3:49 PM  Result Value Ref Range Status   Specimen Description SYNOVIAL  Final   Special Requests Immunocompromised  Final   Gram Stain MANY WBC SEEN NO ORGANISMS SEEN   Final   Culture No growth aerobically or anaerobically.  Final   Report Status 03/17/2016 FINAL  Final  CULTURE, BLOOD (ROUTINE X 2) w Reflex to PCR ID Panel     Status: None (Preliminary result)   Collection Time: 03/01/2016 11:55 AM  Result Value Ref Range Status   Specimen Description BLOOD LEFT HAND  Final   Special Requests   Final    BOTTLES DRAWN AEROBIC AND ANAEROBIC  AERO 13CC ANA 5CC   Culture NO GROWTH 4 DAYS  Final   Report Status PENDING  Incomplete  CULTURE, BLOOD (ROUTINE X 2) w Reflex to PCR ID Panel     Status: None (Preliminary result)   Collection Time: 03/16/2016 11:55 AM  Result Value Ref Range Status   Specimen Description BLOOD RIGHT HAND  Final   Special Requests BOTTLES DRAWN AEROBIC AND ANAEROBIC  [place]  Final   Culture NO GROWTH 4 DAYS  Final   Report Status PENDING  Incomplete    Coagulation Studies: No results for input(s): LABPROT, INR in the last 72 hours.  Urinalysis: No results for input(s): COLORURINE, LABSPEC, PHURINE, GLUCOSEU, HGBUR, BILIRUBINUR, KETONESUR, PROTEINUR, UROBILINOGEN, NITRITE, LEUKOCYTESUR in the last 72 hours.  Invalid input(s): APPERANCEUR    Imaging: No results found.   Medications:   . morphine     .  morphine injection  4 mg Intravenous Once   [DISCONTINUED] acetaminophen **OR** acetaminophen, bisacodyl, [DISCONTINUED] glycopyrrolate **OR** glycopyrrolate **OR** [DISCONTINUED] glycopyrrolate, LORazepam, polyvinyl alcohol, prochlorperazine  Assessment/ Plan:  75 y.o. white  male  with coronary disease with four-vessel CABG in 1998, gallbladder rupture,  Diabetes with complications of retinopathy, peripheral neuropathy, peripheral vascular disease, Aorto  iliac bypass, angioplasty and stent in his left leg, kyphoplasty for chronic back pain, CKD st 3 with Baseline Cr 1.5/ GFR 46  1. Acute renal failure on chronic kidney disease stage III: baseline creatinine of 1.5, eGFR of 46.  Chronic kidney disease secondary to diabetic nephropathy. Required CRRT. Patient's wife states that they think that he has " hit end of road" S Cr is 1.30  2. Rt Knee septic arthritis with sepsis: strep pyogenes.  ortho and infectious disease following - penicillin G   3. Acute  resp faliure. Intubated 4/3, extubated and re-intubated 4/13. Now with tracheostomy 4/17.   4.Generalized edema/ Anasarca - consider iv albumin for oncotic support  Family leaning towards comfort measures    LOS: 23 Aditri Louischarles 05/01/20174:56 PM

## 2016-03-26 NOTE — Progress Notes (Signed)
Nutrition Brief Note  Chart reviewed. Pt now transitioning to comfort care. Plan for terminal extubation later today. TPN continues at present but no plans to reorder this evening. NPO. No further nutrition interventions warranted at this time. Will sign off Please re-consult if needed.   Kerman Passey [place], [place], LDN [phone] Pager  [phone] Weekend/On-Call Pager

## 2016-03-26 NOTE — Care Management (Signed)
Comfort plan of care is in the process of being initiated

## 2016-03-26 NOTE — Progress Notes (Signed)
Patient expired before arriving at his new room during transport. This RN and 2nd RN confirmed asytole. Advised patient wife, John Schultz, and family members of his death at 47. Also advised Phyliss, RN, charge RN on 1C.

## 2016-03-26 NOTE — Discharge Summary (Addendum)
  DISCUSSION: 75 year old male with septic right knee, developed sepsis/renal failure requiring CRRT, also requiring intubation, now status post tracheostomy on 03/11/2016. Delirium/agitation, and poor urine output, along with inability to completely wean off ventilator are limiting factors to his overall clinical improvement.  Vital signs reviewed, stable. HEENT: Unremarkable. Chest: Clear to all station bilaterally with reduced air entry bilaterally. Heart: Normal S1, S2, no murmurs, rubs or gallops. Extremities: Mild bilateral lower extremity edema. Abdomen: Soft, nontender, nondistended.  PULMONARY Acute vent dep respiratory failure - s/p trach Pulm edema pattern on CXR Failed extubation 04/13 S/P trach 4/17 by ENT Septic shock, resolved NSTEMI PAF AKI Severe hypervolemia Diarrhea  Anemia without overt bleeding Thrombocytopenia  Septic arthritis of the R knee DM 2, controlled ICU acquired delirium - hypoactive, CT head negative Profound deconditioning  Vent settings reviewed and adjusted, blood gas was reviewed, unremarkable, chest x-ray images reviewed, normal life support devices in place. No significant change was noted. Labs reviewed, unremarkable.  Patient's condition was discussed with the family yesterday, they have a subsequently decided to withdraw.  Note: After extensive discussion in between family members and Dr.Zaidyn Claire, family has decided to withdraw care at this time. Pt then passed away soon after.

## 2016-03-26 NOTE — Progress Notes (Signed)
Removed from vent per MD order

## 2016-03-26 NOTE — Progress Notes (Signed)
Pt expired during transport to rm 106. Pronounced by Bretta Bang, RN and Mariane Baumgarten, RN. Attending physician and [facility] notified of pts death. [facility] called. Chaplin in to see pt per family request. Emotional support provided to family.

## 2016-03-26 DEATH — deceased

## 2016-12-24 IMAGING — US US EXTREM LOW VENOUS BILAT
1 series · 13 of 24 positions shown · non-contrast
Comparison: None.



[Series 1: us extrem low venous bilat · 0.12mm/px · 13 of 63 slices shown]
[im 1/63]
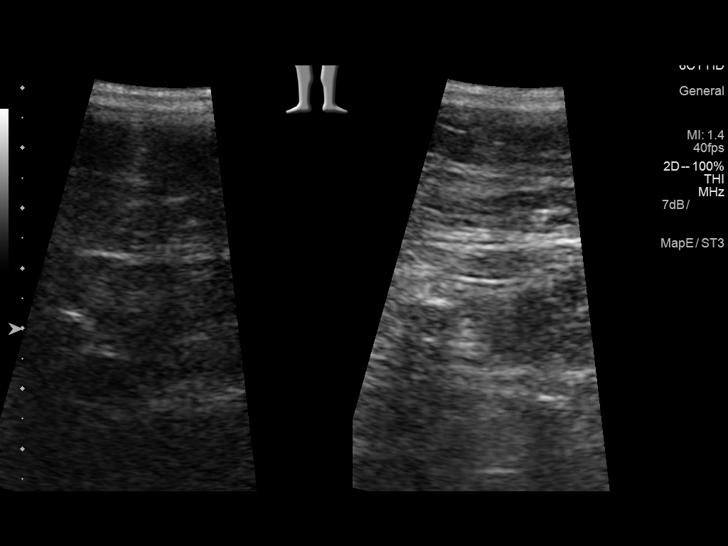
[im 6/63]
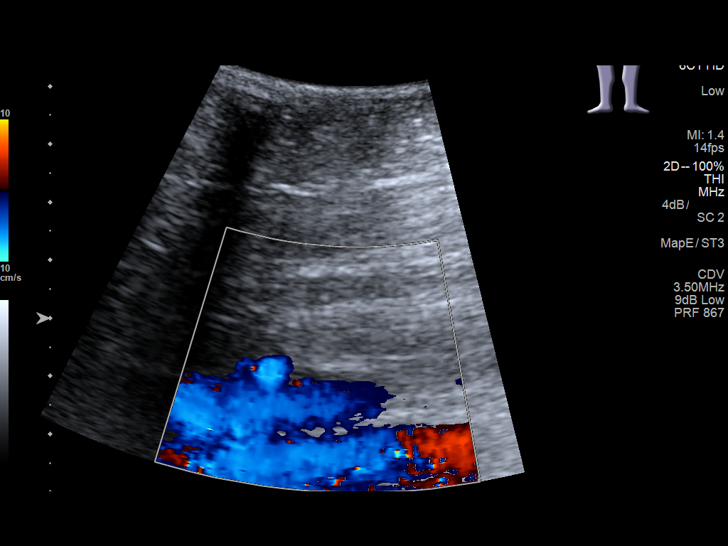
[im 11/63]
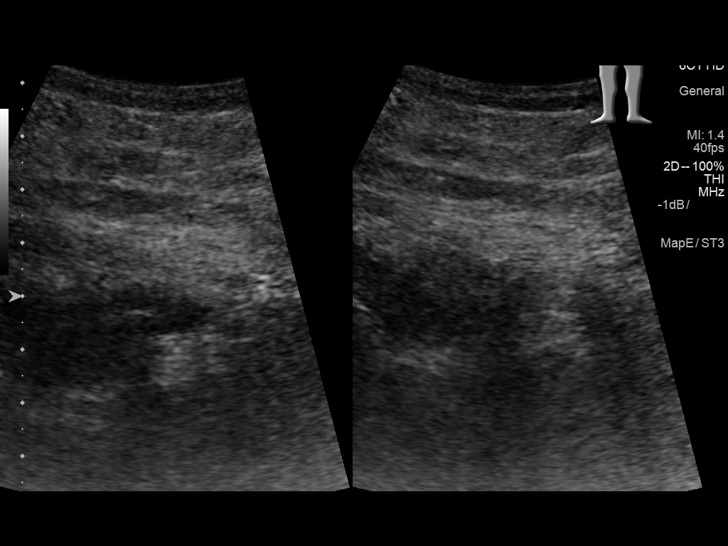
[im 17/63]
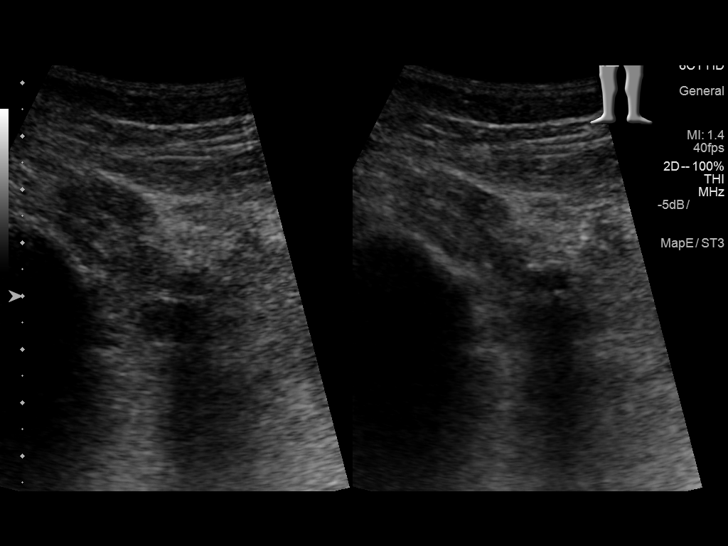
[im 22/63]
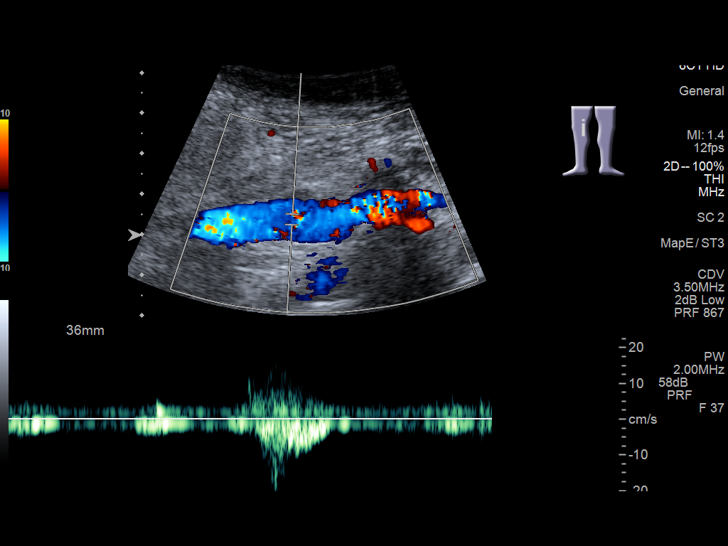
[im 27/63]
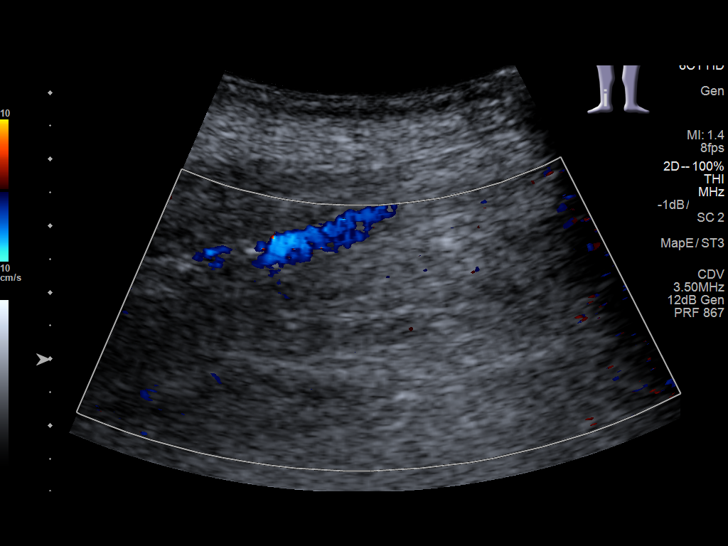
[im 33/63]
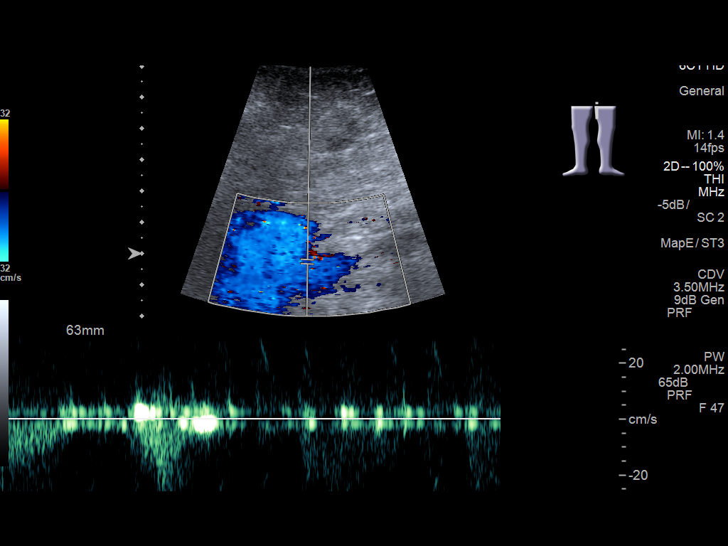
[im 36/63]
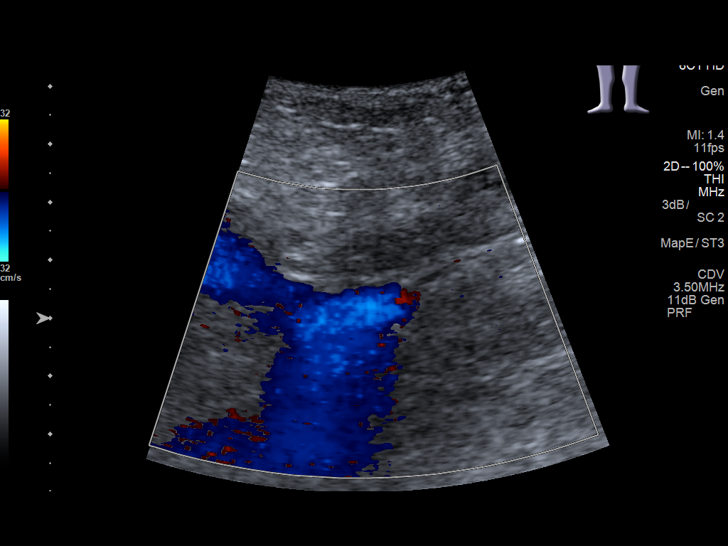
[im 41/63]
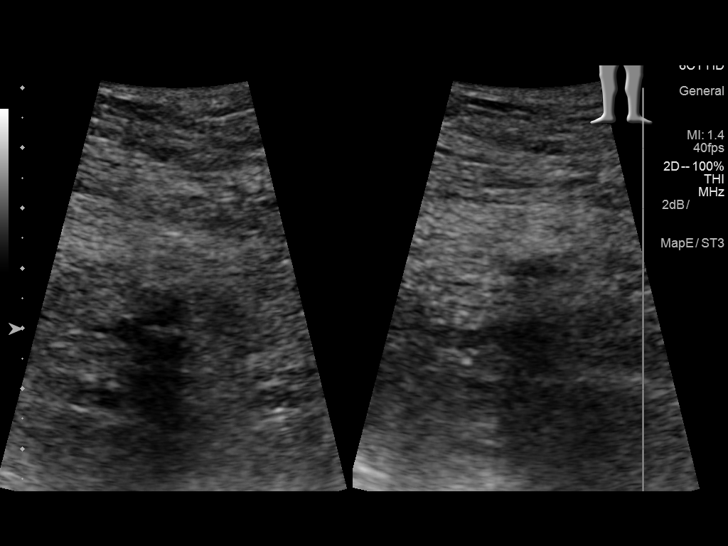
[im 46/63]
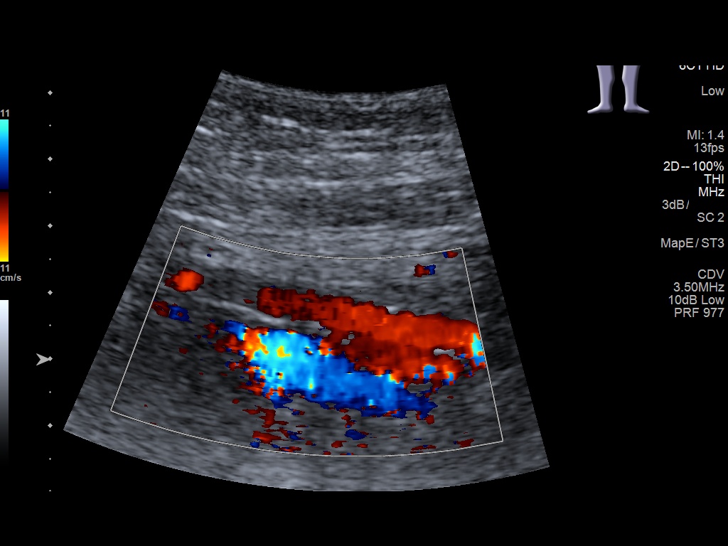
[im 52/63]
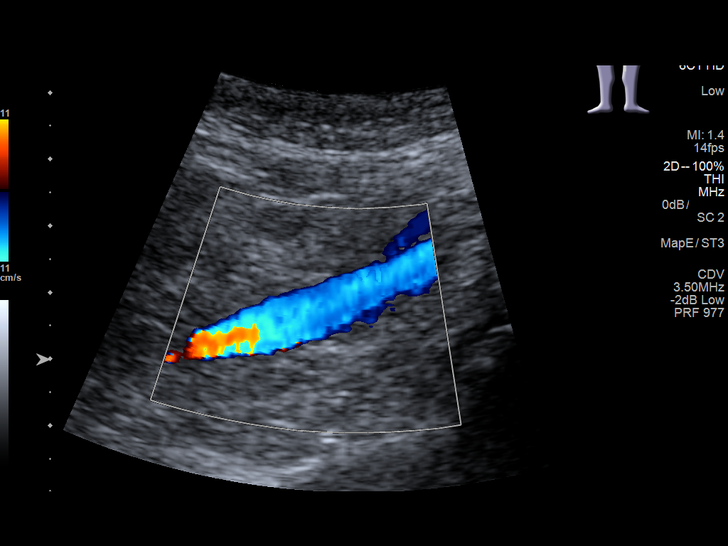
[im 57/63]
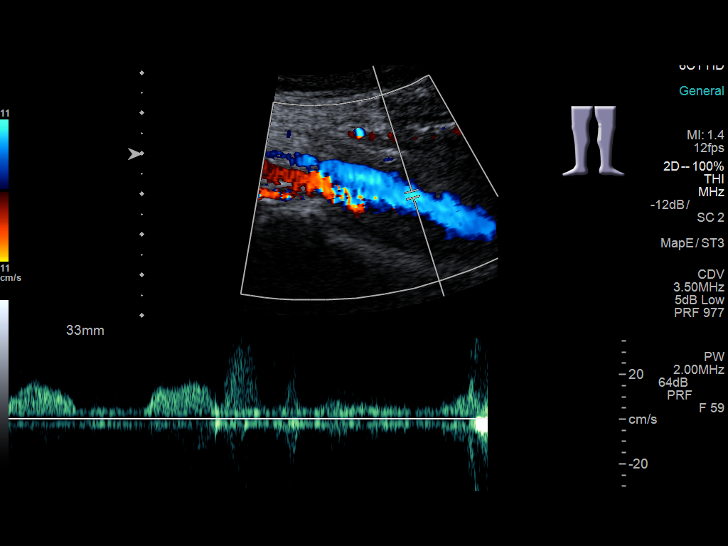
[im 63/63]
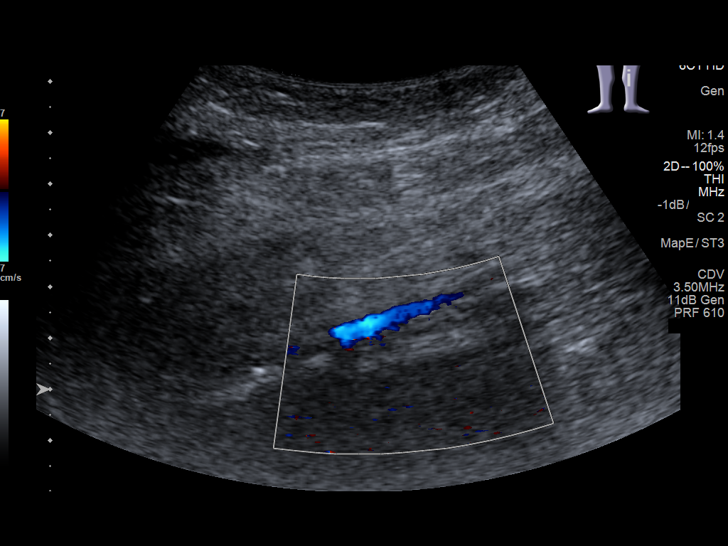

[13 of 24 positions shown; findings below may reference images not displayed]

FINDINGS: RIGHT LOWER EXTREMITY

Common Femoral Vein: No evidence of thrombus. Normal
compressibility, respiratory phasicity and response to augmentation.

Saphenofemoral Junction: No evidence of thrombus. Normal
compressibility and flow on color Doppler imaging.

Profunda Femoral Vein: No evidence of thrombus. Normal
compressibility and flow on color Doppler imaging.

Femoral Vein: No evidence of thrombus. Normal compressibility,
respiratory phasicity and response to augmentation.

Popliteal Vein: No evidence of thrombus. Normal compressibility,
respiratory phasicity and response to augmentation.

Calf Veins: No evidence of thrombus. Normal compressibility and flow
on color Doppler imaging. Peroneal veins not visualized.

Superficial Great Saphenous Vein: No evidence of thrombus. Normal
compressibility and flow on color Doppler imaging.

Other Findings:  None.

LEFT LOWER EXTREMITY

Common Femoral Vein: No evidence of thrombus. Normal
compressibility, respiratory phasicity and response to augmentation.

Saphenofemoral Junction: No evidence of thrombus. Normal
compressibility and flow on color Doppler imaging.

Profunda Femoral Vein: No evidence of thrombus. Normal
compressibility and flow on color Doppler imaging.

Femoral Vein: No evidence of thrombus. Normal compressibility,
respiratory phasicity and response to augmentation.

Popliteal Vein: No evidence of thrombus. Normal compressibility,
respiratory phasicity and response to augmentation.

Calf Veins: No evidence of thrombus. Normal compressibility and flow
on color Doppler imaging. Peroneal veins not visualized.

Superficial Great Saphenous Vein: No evidence of thrombus. Normal
compressibility and flow on color Doppler imaging.

Other Findings: Exam was limited due to lower extremity edema
bilaterally.
IMPRESSION: No evidence of deep venous thrombosis.

## 2017-05-30 IMAGING — DX DG ABDOMEN 1V
2 series · 2 of 2 positions shown · non-contrast
Comparison: February 27, 2016

CLINICAL DATA: Diarrhea

EXAM:
ABDOMEN - 1 VIEW

[abdomen kub (1 of 2)]
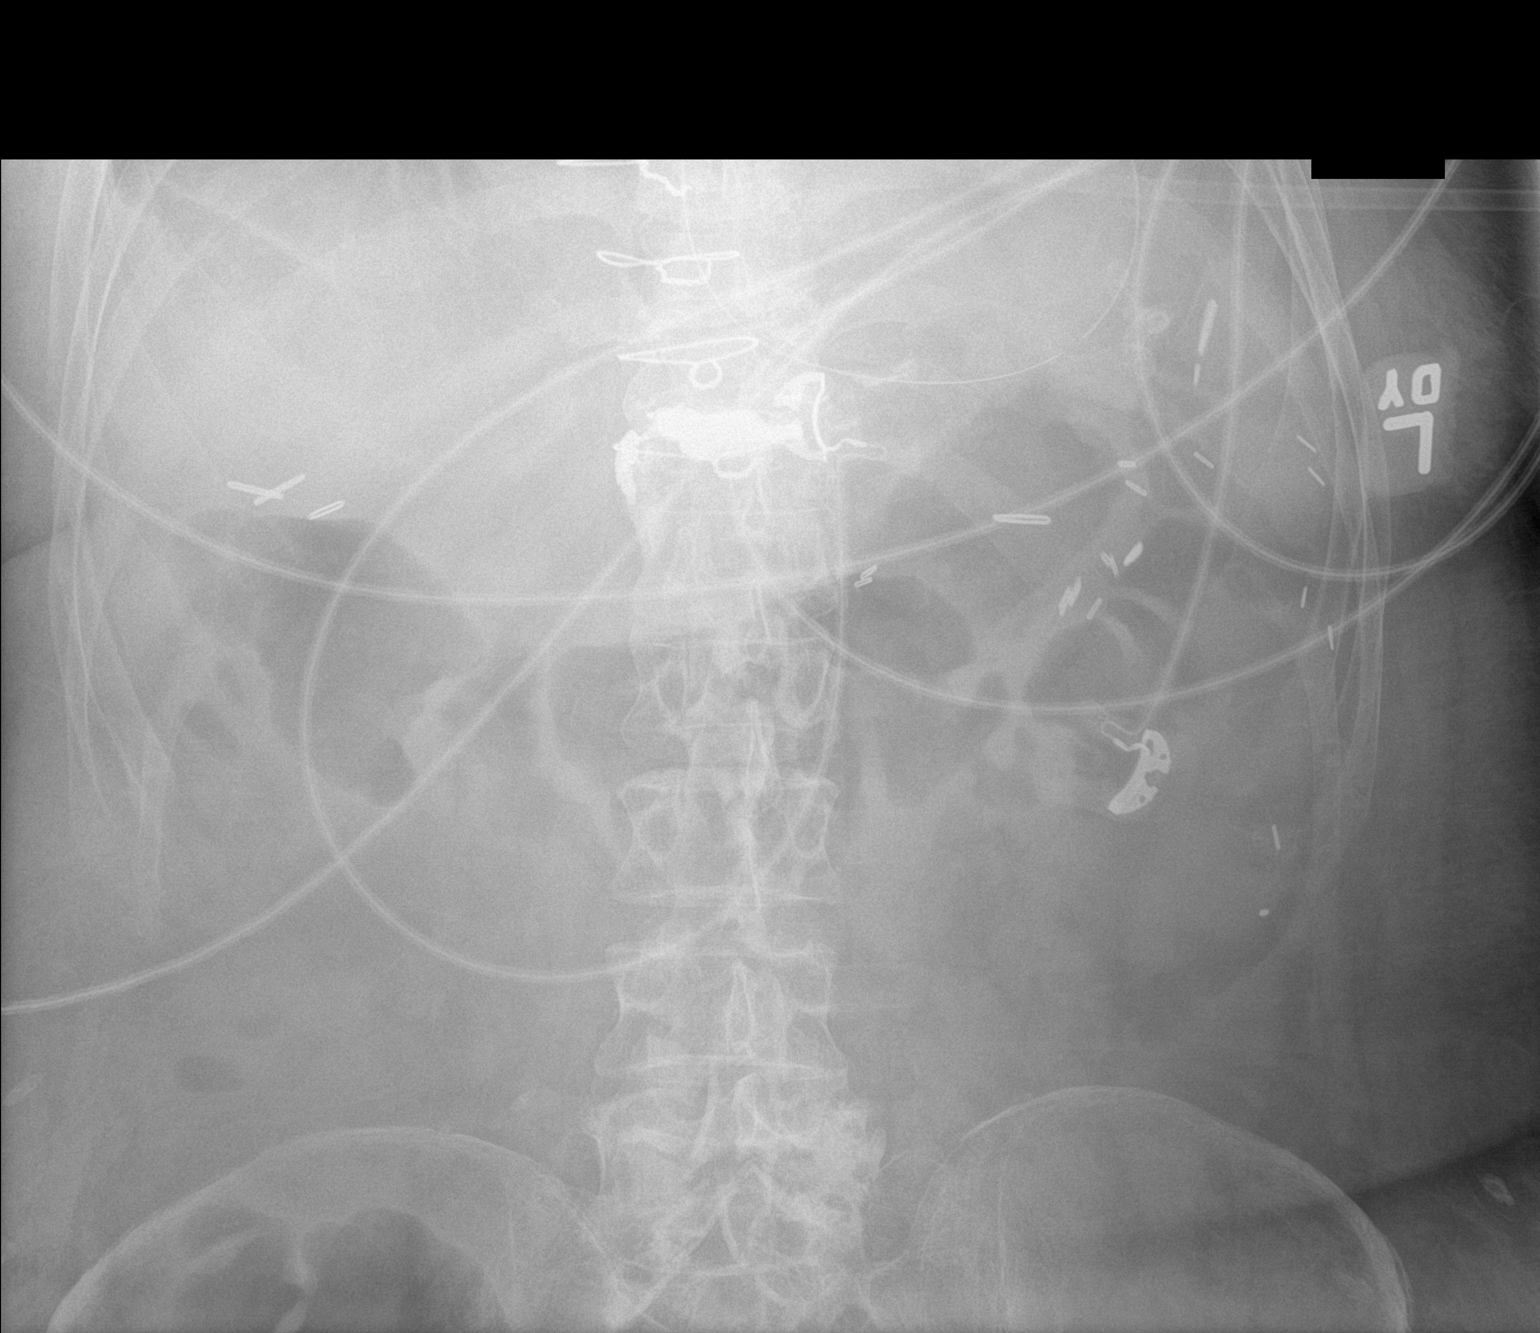

[abdomen kub (2 of 2)]
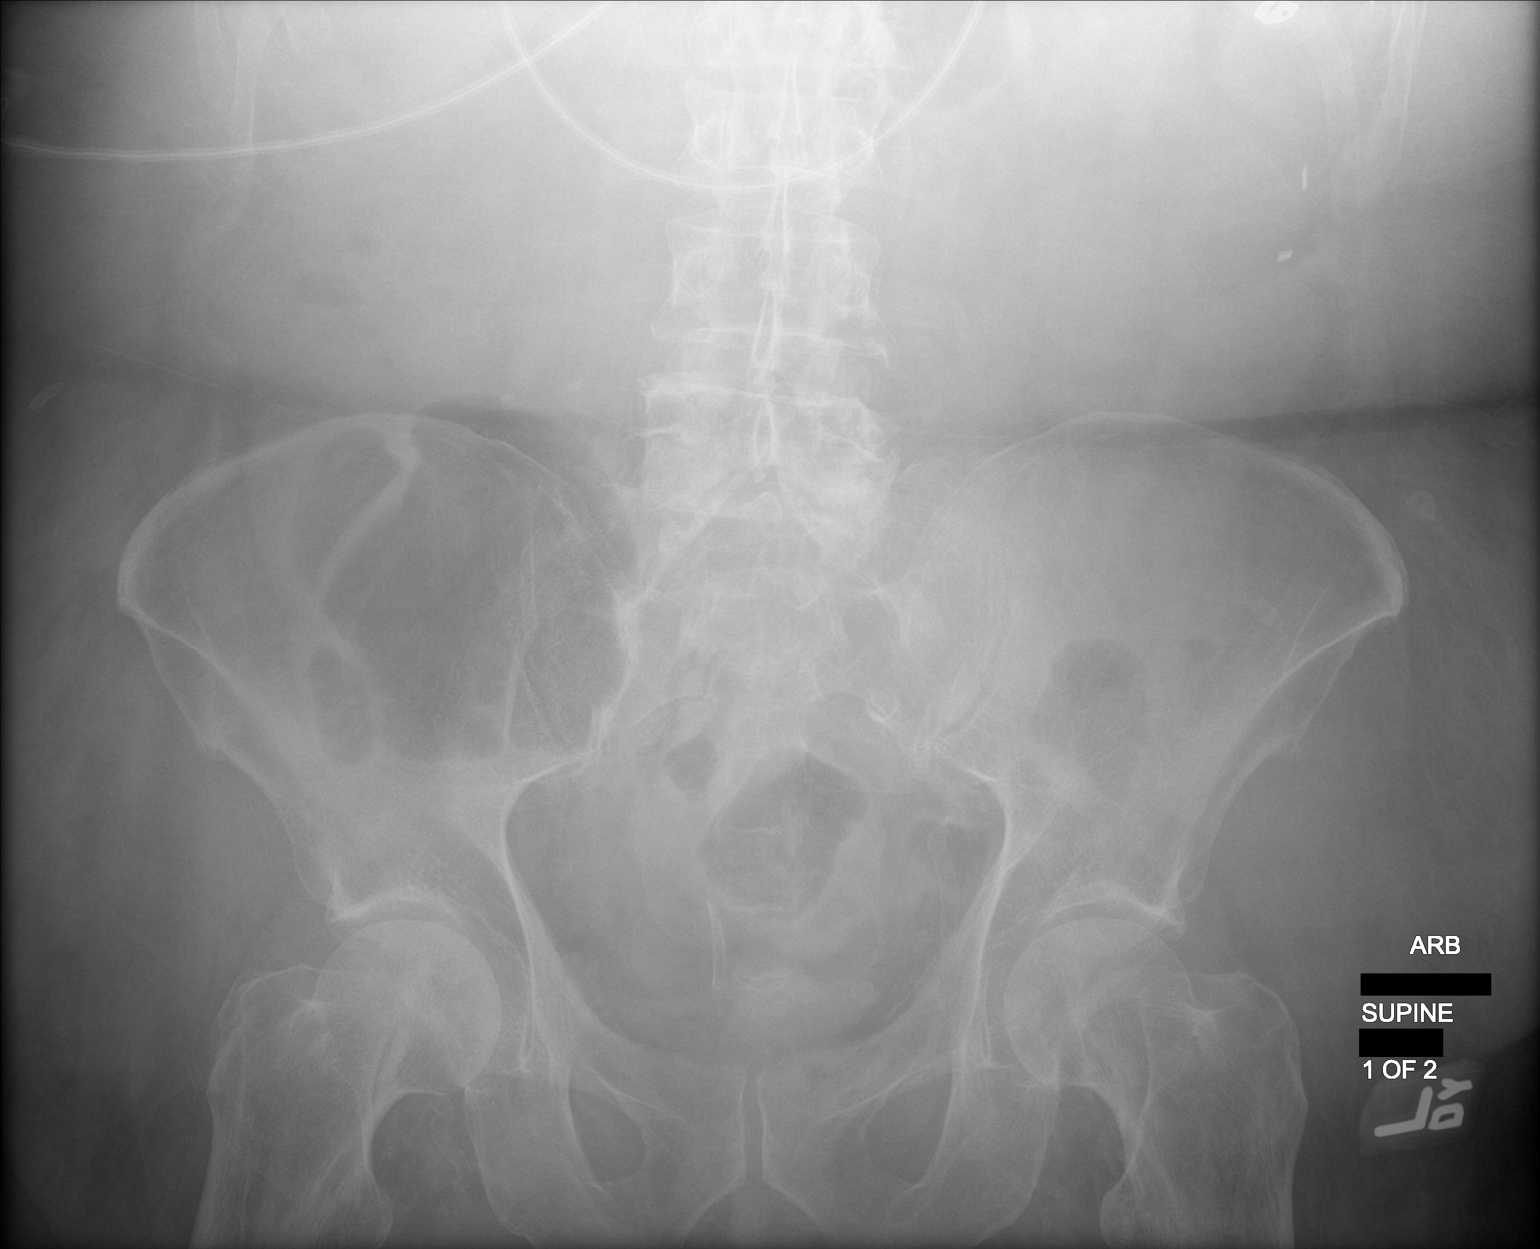

[2 of 2 positions shown; findings below may reference images not displayed]

FINDINGS: An NG tube terminates in left upper quadrant. No free air, portal
venous gas, or pneumatosis. No acute abnormalities.
IMPRESSION: No acute abnormalities.

## 2017-05-31 IMAGING — DX DG CHEST 1V PORT
1 series · 1 of 1 positions shown · non-contrast
Comparison: 03/03/2016 and earlier.

CLINICAL DATA: 75-year-old admitted with septic arthritis involving
the knee, now with ventilator dependent respiratory failure.
Followup basilar atelectasis.

EXAM:
PORTABLE CHEST 1 VIEW

[chest ap]
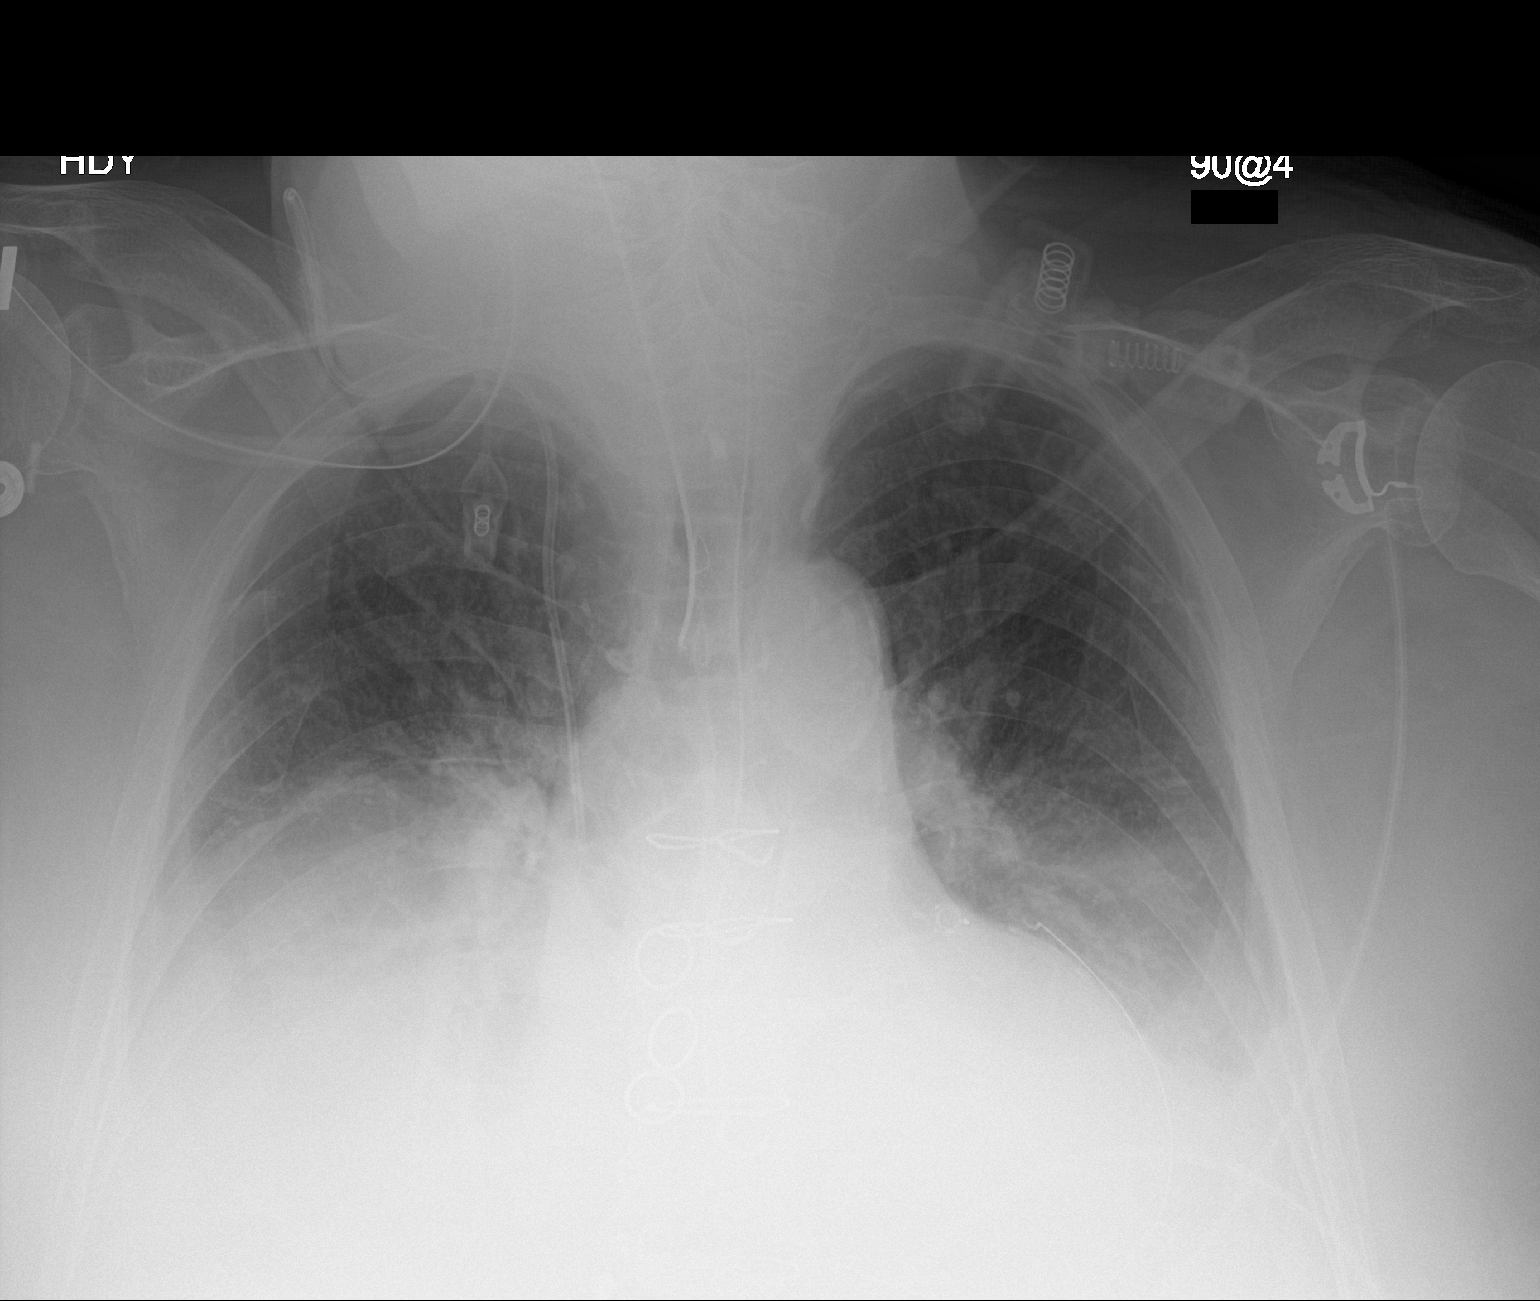

[1 of 1 positions shown; findings below may reference images not displayed]

FINDINGS: Endotracheal tube tip in satisfactory position projecting
approximately 3-4 cm above the carina. Right subclavian central
venous catheter tip projects over the upper SVC, unchanged.
Nasogastric tube is looped in in the stomach which is in a hiatal
hernia. Further worsening of aeration in the lower lobes since
yesterday. Evidence of mild interstitial pulmonary edema currently.
Possible small bilateral pleural effusions.
IMPRESSION: 1. Support apparatus satisfactory. Nasogastric tube looped in the
stomach which is in a hiatal hernia.
2. New mild diffuse interstitial pulmonary edema and possible small
bilateral pleural effusions indicating CHF and/or fluid overload.
3. Worsening bibasilar atelectasis.

## 2017-06-02 IMAGING — DX DG CHEST 1V PORT
1 series · 1 of 1 positions shown · non-contrast
Comparison: 03/05/2016.

CLINICAL DATA: Respiratory failure.

EXAM:
PORTABLE CHEST 1 VIEW

[chest ap]
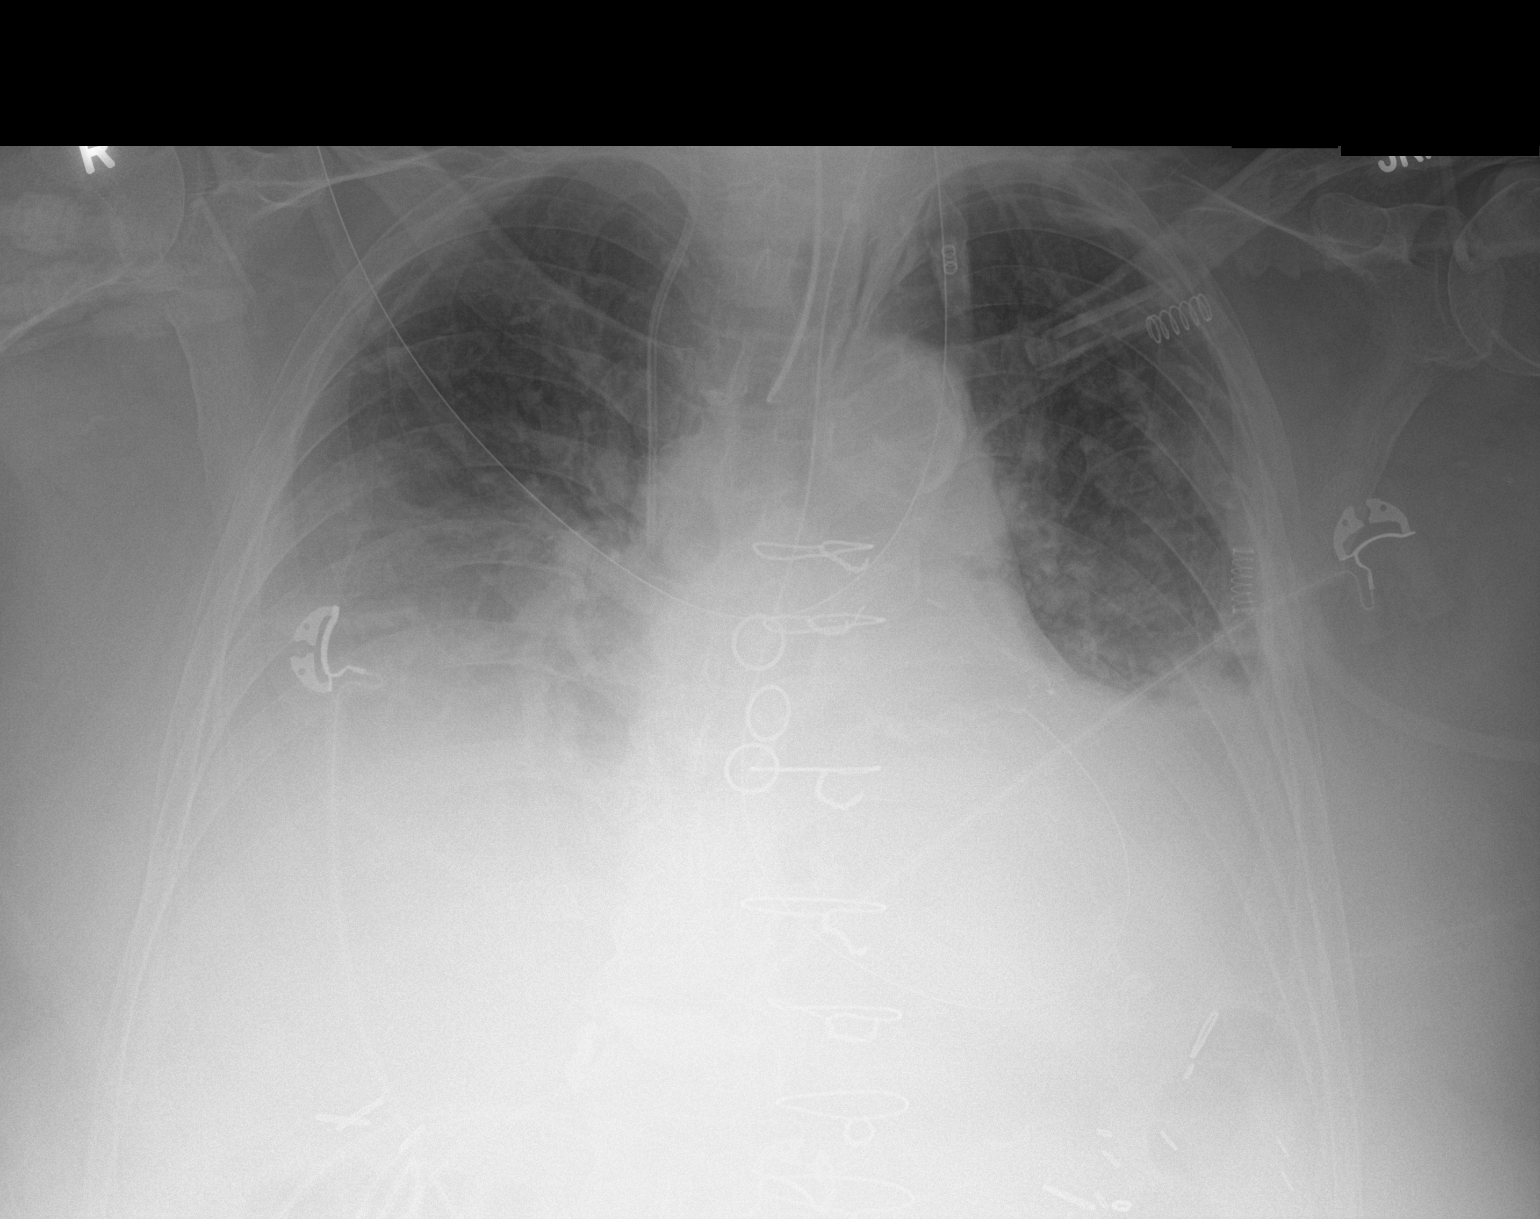

[1 of 1 positions shown; findings below may reference images not displayed]

FINDINGS: Endotracheal tube, NG tube, right IJ line in stable position. Prior
CABG. Cardiomegaly with normal pulmonary vascularity. Low lung
volumes with basilar atelectasis and/or infiltrates. Bilateral
effusions. No pneumothorax. Findings are stable from prior exam.
IMPRESSION: 1. Lines and tubes in stable position.
2. Prior CABG. Stable cardiomegaly. Persistent bilateral pulmonary
infiltrates consistent with pulmonary edema and/or pneumonia. No
interim change. Persistent bilateral pleural effusions.
